# Patient Record
Sex: Female | Born: 1980 | Race: White | Hispanic: No | Marital: Married | Smoking: Former smoker
Health system: Southern US, Community
[De-identification: ages and names within clinical notes are randomized; demographics above are authoritative.]

## PROBLEM LIST (undated history)

## (undated) ENCOUNTER — Inpatient Hospital Stay (HOSPITAL_COMMUNITY): Payer: Self-pay

## (undated) DIAGNOSIS — R079 Chest pain, unspecified: Secondary | ICD-10-CM

## (undated) DIAGNOSIS — G9332 Myalgic encephalomyelitis/chronic fatigue syndrome: Secondary | ICD-10-CM

## (undated) DIAGNOSIS — R7303 Prediabetes: Secondary | ICD-10-CM

## (undated) DIAGNOSIS — F419 Anxiety disorder, unspecified: Secondary | ICD-10-CM

## (undated) DIAGNOSIS — R7989 Other specified abnormal findings of blood chemistry: Secondary | ICD-10-CM

## (undated) DIAGNOSIS — G709 Myoneural disorder, unspecified: Secondary | ICD-10-CM

## (undated) DIAGNOSIS — R739 Hyperglycemia, unspecified: Secondary | ICD-10-CM

## (undated) DIAGNOSIS — E559 Vitamin D deficiency, unspecified: Secondary | ICD-10-CM

## (undated) DIAGNOSIS — F40240 Claustrophobia: Secondary | ICD-10-CM

## (undated) DIAGNOSIS — F329 Major depressive disorder, single episode, unspecified: Secondary | ICD-10-CM

## (undated) DIAGNOSIS — G43909 Migraine, unspecified, not intractable, without status migrainosus: Secondary | ICD-10-CM

## (undated) DIAGNOSIS — D689 Coagulation defect, unspecified: Secondary | ICD-10-CM

## (undated) DIAGNOSIS — F1910 Other psychoactive substance abuse, uncomplicated: Secondary | ICD-10-CM

## (undated) DIAGNOSIS — R0602 Shortness of breath: Secondary | ICD-10-CM

## (undated) DIAGNOSIS — E669 Obesity, unspecified: Secondary | ICD-10-CM

## (undated) DIAGNOSIS — E282 Polycystic ovarian syndrome: Secondary | ICD-10-CM

## (undated) DIAGNOSIS — G35D Multiple sclerosis, unspecified: Secondary | ICD-10-CM

## (undated) DIAGNOSIS — F603 Borderline personality disorder: Secondary | ICD-10-CM

## (undated) DIAGNOSIS — K219 Gastro-esophageal reflux disease without esophagitis: Secondary | ICD-10-CM

## (undated) DIAGNOSIS — D509 Iron deficiency anemia, unspecified: Secondary | ICD-10-CM

## (undated) DIAGNOSIS — F32A Depression, unspecified: Secondary | ICD-10-CM

## (undated) DIAGNOSIS — M1990 Unspecified osteoarthritis, unspecified site: Secondary | ICD-10-CM

## (undated) DIAGNOSIS — F1020 Alcohol dependence, uncomplicated: Secondary | ICD-10-CM

## (undated) DIAGNOSIS — R5383 Other fatigue: Secondary | ICD-10-CM

## (undated) DIAGNOSIS — R011 Cardiac murmur, unspecified: Secondary | ICD-10-CM

## (undated) DIAGNOSIS — M2550 Pain in unspecified joint: Secondary | ICD-10-CM

## (undated) DIAGNOSIS — L409 Psoriasis, unspecified: Secondary | ICD-10-CM

## (undated) DIAGNOSIS — E785 Hyperlipidemia, unspecified: Secondary | ICD-10-CM

## (undated) DIAGNOSIS — Z8751 Personal history of pre-term labor: Secondary | ICD-10-CM

## (undated) DIAGNOSIS — K5900 Constipation, unspecified: Secondary | ICD-10-CM

## (undated) DIAGNOSIS — F3181 Bipolar II disorder: Secondary | ICD-10-CM

## (undated) DIAGNOSIS — O139 Gestational [pregnancy-induced] hypertension without significant proteinuria, unspecified trimester: Secondary | ICD-10-CM

## (undated) DIAGNOSIS — O99210 Obesity complicating pregnancy, unspecified trimester: Secondary | ICD-10-CM

## (undated) DIAGNOSIS — G4730 Sleep apnea, unspecified: Secondary | ICD-10-CM

## (undated) DIAGNOSIS — IMO0002 Reserved for concepts with insufficient information to code with codable children: Secondary | ICD-10-CM

## (undated) DIAGNOSIS — L4050 Arthropathic psoriasis, unspecified: Secondary | ICD-10-CM

## (undated) DIAGNOSIS — G35 Multiple sclerosis: Secondary | ICD-10-CM

## (undated) DIAGNOSIS — N970 Female infertility associated with anovulation: Secondary | ICD-10-CM

## (undated) HISTORY — DX: Obesity, unspecified: E66.9

## (undated) HISTORY — DX: Anxiety disorder, unspecified: F41.9

## (undated) HISTORY — DX: Psoriasis, unspecified: L40.9

## (undated) HISTORY — DX: Iron deficiency anemia, unspecified: D50.9

## (undated) HISTORY — DX: Major depressive disorder, single episode, unspecified: F32.9

## (undated) HISTORY — DX: Multiple sclerosis: G35

## (undated) HISTORY — DX: Arthropathic psoriasis, unspecified: L40.50

## (undated) HISTORY — DX: Obesity complicating pregnancy, unspecified trimester: O99.210

## (undated) HISTORY — DX: Polycystic ovarian syndrome: E28.2

## (undated) HISTORY — DX: Other fatigue: R53.83

## (undated) HISTORY — DX: Constipation, unspecified: K59.00

## (undated) HISTORY — DX: Other specified abnormal findings of blood chemistry: R79.89

## (undated) HISTORY — DX: Bipolar II disorder: F31.81

## (undated) HISTORY — DX: Prediabetes: R73.03

## (undated) HISTORY — DX: Sleep apnea, unspecified: G47.30

## (undated) HISTORY — DX: Gastro-esophageal reflux disease without esophagitis: K21.9

## (undated) HISTORY — DX: Chest pain, unspecified: R07.9

## (undated) HISTORY — DX: Vitamin D deficiency, unspecified: E55.9

## (undated) HISTORY — DX: Pain in unspecified joint: M25.50

## (undated) HISTORY — DX: Reserved for concepts with insufficient information to code with codable children: IMO0002

## (undated) HISTORY — DX: Myoneural disorder, unspecified: G70.9

## (undated) HISTORY — DX: Myalgic encephalomyelitis/chronic fatigue syndrome: G93.32

## (undated) HISTORY — DX: Shortness of breath: R06.02

## (undated) HISTORY — DX: Depression, unspecified: F32.A

## (undated) HISTORY — DX: Unspecified osteoarthritis, unspecified site: M19.90

## (undated) HISTORY — DX: Other psychoactive substance abuse, uncomplicated: F19.10

## (undated) HISTORY — DX: Coagulation defect, unspecified: D68.9

## (undated) HISTORY — DX: Personal history of pre-term labor: Z87.51

## (undated) HISTORY — DX: Hyperlipidemia, unspecified: E78.5

## (undated) HISTORY — DX: Female infertility associated with anovulation: N97.0

## (undated) HISTORY — DX: Borderline personality disorder: F60.3

## (undated) HISTORY — DX: Alcohol dependence, uncomplicated: F10.20

## (undated) HISTORY — DX: Multiple sclerosis, unspecified: G35.D

## (undated) HISTORY — DX: Hyperglycemia, unspecified: R73.9

---

## 2007-11-19 ENCOUNTER — Ambulatory Visit: Payer: Self-pay | Admitting: Family Medicine

## 2007-11-19 DIAGNOSIS — E785 Hyperlipidemia, unspecified: Secondary | ICD-10-CM

## 2007-11-26 ENCOUNTER — Ambulatory Visit: Payer: Self-pay | Admitting: Family Medicine

## 2007-11-26 DIAGNOSIS — E669 Obesity, unspecified: Secondary | ICD-10-CM | POA: Insufficient documentation

## 2007-11-27 ENCOUNTER — Encounter: Payer: Self-pay | Admitting: Family Medicine

## 2007-11-28 LAB — CONVERTED CEMR LAB
Basophils Relative: 0 % (ref 0–1)
Lymphs Abs: 3.1 10*3/uL (ref 0.7–4.0)
MCHC: 34.3 g/dL (ref 30.0–36.0)
Monocytes Relative: 8 % (ref 3–12)
Neutro Abs: 5 10*3/uL (ref 1.7–7.7)
Neutrophils Relative %: 55 % (ref 43–77)
RBC: 4.51 M/uL (ref 3.87–5.11)
WBC: 9 10*3/uL (ref 4.0–10.5)

## 2007-12-31 ENCOUNTER — Encounter: Payer: Self-pay | Admitting: Family Medicine

## 2008-01-05 ENCOUNTER — Ambulatory Visit: Payer: Self-pay | Admitting: Family Medicine

## 2008-01-05 DIAGNOSIS — F4322 Adjustment disorder with anxiety: Secondary | ICD-10-CM

## 2008-02-20 ENCOUNTER — Encounter: Payer: Self-pay | Admitting: Family Medicine

## 2008-02-20 LAB — CONVERTED CEMR LAB
ALT: 60 units/L
AST: 51 units/L
Albumin: 4.4 g/dL
BUN: 9 mg/dL
Calcium: 9.3 mg/dL
Chloride: 103 meq/L
Hemoglobin: 13.5 g/dL
Potassium: 4.4 meq/L
Sodium: 139 meq/L
Total Protein: 7.7 g/dL

## 2008-02-26 ENCOUNTER — Ambulatory Visit: Payer: Self-pay | Admitting: Family Medicine

## 2008-03-05 ENCOUNTER — Encounter: Payer: Self-pay | Admitting: Family Medicine

## 2008-03-05 DIAGNOSIS — G35 Multiple sclerosis: Secondary | ICD-10-CM | POA: Insufficient documentation

## 2008-03-05 DIAGNOSIS — E559 Vitamin D deficiency, unspecified: Secondary | ICD-10-CM | POA: Insufficient documentation

## 2008-03-11 ENCOUNTER — Telehealth: Payer: Self-pay | Admitting: Family Medicine

## 2008-03-29 ENCOUNTER — Encounter: Payer: Self-pay | Admitting: Family Medicine

## 2008-04-05 ENCOUNTER — Encounter: Payer: Self-pay | Admitting: Family Medicine

## 2008-05-17 ENCOUNTER — Ambulatory Visit: Payer: Self-pay | Admitting: Family Medicine

## 2008-08-15 HISTORY — PX: DILATION AND CURETTAGE OF UTERUS: SHX78

## 2008-08-25 ENCOUNTER — Encounter: Payer: Self-pay | Admitting: Obstetrics and Gynecology

## 2008-08-25 ENCOUNTER — Ambulatory Visit (HOSPITAL_COMMUNITY): Admission: RE | Admit: 2008-08-25 | Discharge: 2008-08-25 | Payer: Self-pay | Admitting: Obstetrics and Gynecology

## 2008-09-03 ENCOUNTER — Ambulatory Visit (HOSPITAL_COMMUNITY): Admission: RE | Admit: 2008-09-03 | Discharge: 2008-09-03 | Payer: Self-pay | Admitting: Obstetrics and Gynecology

## 2008-09-14 ENCOUNTER — Encounter: Payer: Self-pay | Admitting: Family Medicine

## 2008-10-27 ENCOUNTER — Ambulatory Visit: Payer: Self-pay | Admitting: Family Medicine

## 2009-01-03 ENCOUNTER — Ambulatory Visit: Payer: Self-pay | Admitting: Family Medicine

## 2009-01-19 ENCOUNTER — Encounter: Payer: Self-pay | Admitting: Family Medicine

## 2009-03-08 ENCOUNTER — Ambulatory Visit: Payer: Self-pay | Admitting: Family Medicine

## 2009-03-15 ENCOUNTER — Encounter: Payer: Self-pay | Admitting: Family Medicine

## 2009-03-22 ENCOUNTER — Ambulatory Visit: Payer: Self-pay | Admitting: Family Medicine

## 2009-03-22 DIAGNOSIS — G43909 Migraine, unspecified, not intractable, without status migrainosus: Secondary | ICD-10-CM | POA: Insufficient documentation

## 2009-03-22 DIAGNOSIS — M545 Low back pain: Secondary | ICD-10-CM

## 2009-03-23 ENCOUNTER — Encounter: Admission: RE | Admit: 2009-03-23 | Discharge: 2009-03-23 | Payer: Self-pay | Admitting: Family Medicine

## 2009-03-24 ENCOUNTER — Ambulatory Visit: Payer: Self-pay | Admitting: Family Medicine

## 2009-04-05 ENCOUNTER — Encounter: Payer: Self-pay | Admitting: Family Medicine

## 2009-06-20 ENCOUNTER — Ambulatory Visit: Payer: Self-pay | Admitting: Family Medicine

## 2009-06-20 DIAGNOSIS — K210 Gastro-esophageal reflux disease with esophagitis: Secondary | ICD-10-CM

## 2009-07-27 ENCOUNTER — Ambulatory Visit: Payer: Self-pay | Admitting: Family Medicine

## 2009-07-29 ENCOUNTER — Telehealth: Payer: Self-pay | Admitting: Family Medicine

## 2009-08-01 ENCOUNTER — Encounter: Payer: Self-pay | Admitting: Family Medicine

## 2009-08-05 ENCOUNTER — Encounter: Payer: Self-pay | Admitting: Family Medicine

## 2009-08-30 ENCOUNTER — Ambulatory Visit: Payer: Self-pay | Admitting: Obstetrics & Gynecology

## 2009-09-05 ENCOUNTER — Ambulatory Visit: Payer: Self-pay | Admitting: Family Medicine

## 2009-09-05 ENCOUNTER — Encounter: Payer: Self-pay | Admitting: Obstetrics & Gynecology

## 2009-09-06 LAB — CONVERTED CEMR LAB
ALT: 24 units/L (ref 0–35)
Albumin: 4.5 g/dL (ref 3.5–5.2)
CO2: 20 meq/L (ref 19–32)
Calcium: 9.3 mg/dL (ref 8.4–10.5)
Chloride: 108 meq/L (ref 96–112)
Cholesterol: 241 mg/dL — ABNORMAL HIGH (ref 0–200)
Glucose, Bld: 101 mg/dL — ABNORMAL HIGH (ref 70–99)
Potassium: 4.1 meq/L (ref 3.5–5.3)
Sodium: 139 meq/L (ref 135–145)
Total Protein: 7.1 g/dL (ref 6.0–8.3)
Triglycerides: 216 mg/dL — ABNORMAL HIGH (ref <150)

## 2009-10-06 ENCOUNTER — Ambulatory Visit: Payer: Self-pay | Admitting: Family Medicine

## 2009-10-06 DIAGNOSIS — J069 Acute upper respiratory infection, unspecified: Secondary | ICD-10-CM | POA: Insufficient documentation

## 2009-11-01 ENCOUNTER — Ambulatory Visit: Payer: Self-pay | Admitting: Family Medicine

## 2009-11-01 ENCOUNTER — Ambulatory Visit: Payer: Self-pay | Admitting: Obstetrics & Gynecology

## 2009-11-10 ENCOUNTER — Ambulatory Visit (HOSPITAL_COMMUNITY): Admission: RE | Admit: 2009-11-10 | Discharge: 2009-11-10 | Payer: Self-pay | Admitting: Anesthesiology

## 2009-12-05 ENCOUNTER — Ambulatory Visit: Payer: Self-pay | Admitting: Family Medicine

## 2009-12-05 DIAGNOSIS — R635 Abnormal weight gain: Secondary | ICD-10-CM

## 2009-12-12 ENCOUNTER — Encounter: Payer: Self-pay | Admitting: Obstetrics & Gynecology

## 2009-12-12 ENCOUNTER — Ambulatory Visit: Payer: Self-pay | Admitting: Obstetrics and Gynecology

## 2009-12-12 LAB — CONVERTED CEMR LAB: hCG, Beta Chain, Quant, S: <2 milliintl units/mL

## 2009-12-14 ENCOUNTER — Encounter: Payer: Self-pay | Admitting: Family Medicine

## 2010-02-14 NOTE — Assessment & Plan Note (Signed)
Summary: Wt Check  Nurse Visit   Vital Signs:  Patient profile:   30 year old female Height:      67 inches Weight:      217 pounds BMI:     34.11 O2 Sat:      99 % on Room air Pulse rate:   102 / minute BP sitting:   114 / 68  (left arm) Cuff size:   large  Vitals Entered By: Payton Spark CMA (July 27, 2009 2:39 PM)  O2 Flow:  Room air CC: Weight check and BP check. Would like refill.   Allergies: No Known Drug Allergies  Orders Added: 1)  Est. Patient Level I [95621]    Impression & Recommendations:  Problem # 1:  OBESITY, UNSPECIFIED (ICD-278.00) 8 more lbs lost with healthy diet, regular exercise and phentermine.  BP looks great.  Tolerating meds well.  Continue at current dose.  Will recheck fsating sugar and cholesterol next month with nurse visit.  Complete Medication List: 1)  Tysabri 300 Mg/44ml Conc (Natalizumab) .... Infused once monthly 2)  Imitrex 25 Mg Tabs (Sumatriptan succinate) .... Prn 3)  Depo-provera 150 Mg/ml Susp (Medroxyprogesterone acetate) .... Inj every 3 months 4)  Keppra 500 Mg Tabs (Levetiracetam) 5)  Alprazolam 0.5 Mg Tabs (Alprazolam) .Marland Kitchen.. 1 tab by mouth at bedtime as needed anxiety 6)  Phentermine Hcl 37.5 Mg Caps (Phentermine hcl) .Marland Kitchen.. 1 capsule by mouth qam, take 30 min before breakfast

## 2010-02-14 NOTE — Assessment & Plan Note (Signed)
Summary: URI/ WT   Vital Signs:  Patient profile:   30 year old female Height:      67 inches Weight:      211 pounds BMI:     33.17 O2 Sat:      97 % on Room air Temp:     98.6 degrees F oral Pulse rate:   93 / minute BP sitting:   126 / 85  (left arm) Cuff size:   large  Vitals Entered By: Payton Spark CMA (October 06, 2009 11:31 AM)  O2 Flow:  Room air CC: head congestion, facial pressure, scratchy throat, achy and dry cough x 2 days.   Primary Care Provider:  Seymour Bars DO  CC:  head congestion, facial pressure, scratchy throat, and achy and dry cough x 2 days.Marland Kitchen  History of Present Illness: 30 yo WF presents for 2 days of runny nose, congestion, sore throat.  Her cough is getting worse with sinus pressure and pain.  Her last dose of Doy Mince was in August.  Her weight was down to 205 until she went on her bachelorette party over the weekend in [facility].  Admits to indulging in alcohol and lots of food.  She denies any adverse SEs and thinks that the Phentermine was really helping her appetite.     Current Medications (verified): 1)  Tysabri 300 Mg/20ml Conc (Natalizumab) .... Infused Once Monthly 2)  Imitrex 25 Mg Tabs (Sumatriptan Succinate) .... Prn 3)  Depo-Provera 150 Mg/ml Susp (Medroxyprogesterone Acetate) .... Inj Every 3 Months 4)  Keppra 500 Mg Tabs (Levetiracetam) 5)  Alprazolam 0.5 Mg Tabs (Alprazolam) .Marland Kitchen.. 1 Tab By Mouth At Bedtime As Needed Anxiety 6)  Phentermine Hcl 37.5 Mg Caps (Phentermine Hcl) .Marland Kitchen.. 1 Capsule By Mouth Qam, Take 30 Min Before Breakfast  Allergies (verified): No Known Drug Allergies  Past History:  Past Medical History: Reviewed history from 10/27/2008 and no changes required. MS, relapsing remitting 2006 diagnosed--sees Neuro Hyperlipidemia- not on meds G1P0 Migraines obese  Social History: Reviewed history from 05/17/2008 and no changes required. Quit smoking 1 yr ago smoked 13 yrs, restarted 02-2008 Occupation:Recruiter  CRA Alcohol use-yes Drug use-yes Regular exercise-no Dating BF x 6 yrs.    Review of Systems      See HPI  Physical Exam  General:  alert, well-developed, well-nourished, and well-hydrated.   Head:  normocephalic and atraumatic.  sinuses NTTP Eyes:  conjunctiva clear Ears:  EACs patent; TMs translucent and gray with good cone of light and bony landmarks.  Nose:  clear rhinorrhea Mouth:  o/p injected with 1+ tonsilar hypertrophy Neck:  no masses.   Lungs:  Normal respiratory effort, chest expands symmetrically. Lungs are clear to auscultation, no crackles or wheezes. Heart:  Normal rate and regular rhythm. S1 and S2 normal without gallop, murmur, click, rub or other extra sounds. Skin:  color normal.   Cervical Nodes:  No lymphadenopathy noted Psych:  good eye contact, not anxious appearing, and not depressed appearing.     Impression & Recommendations:  Problem # 1:  VIRAL URI (ICD-465.9) Day 3 of viral URI but pt is on immunosuppressants with her MS. I will empirically cover her with Azithromycin since she is immunosuppressed. Take Dayquil/ Nyquil/ Vit C for supportive care with rest, plenty of clear fluids. call if any worsening or failing to improve after 10 days.  Problem # 2:  OBESITY, UNSPECIFIED (ICD-278.00) BMI 33 with only 1 lb lost in the past month on phentermine with normal BP and  HR today. She reports losing an additional 6 lbs of weight prior to a trip to [facility] for her bachelorette party. She agrees to work on diet, exercise with another month of Phentermine.  Complete Medication List: 1)  Tysabri 300 Mg/88ml Conc (Natalizumab) .... Infused once monthly 2)  Imitrex 25 Mg Tabs (Sumatriptan succinate) .... Prn 3)  Depo-provera 150 Mg/ml Susp (Medroxyprogesterone acetate) .... Inj every 3 months 4)  Keppra 500 Mg Tabs (Levetiracetam) 5)  Alprazolam 0.5 Mg Tabs (Alprazolam) .Marland Kitchen.. 1 tab by mouth at bedtime as needed anxiety 6)  Phentermine Hcl 37.5 Mg Tabs  (Phentermine hcl) .Marland Kitchen.. 1 tab by mouth qam, take 30 min before breakfast 7)  Azithromycin 250 Mg Tabs (Azithromycin) .... 2 tabs by mouth x 1 day then 1 tab by mouth daily x 4 days  Patient Instructions: 1)  Take Dayquil/ Nyquil/ Vitamin C. 2)  Rest, drink plenty of fluids. 3)  Start on Azithromycin -- take x 5 days (since you are on immunosuppressant drugs). 4)  Call if getting worse or not improving after 10 days. 5)  Return for a nurse BP/ WT check in 1 month. Prescriptions: AZITHROMYCIN 250 MG TABS (AZITHROMYCIN) 2 tabs by mouth x 1 day then 1 tab by mouth daily x 4 days  #6 tabs x 0   Entered and Authorized by:   Seymour Bars DO   Signed by:   Seymour Bars DO on 10/06/2009   Method used:   Electronically to        [facility] [phone]* (retail)       [address]       [place], [place]  [address]       Ph: [phone]       Fax: [phone]   RxID:   [id number] PHENTERMINE HCL 37.5 MG TABS (PHENTERMINE HCL) 1 tab by mouth qAM, take 30 min before breakfast  #30 x 0   Entered and Authorized by:   Seymour Bars DO   Signed by:   Seymour Bars DO on 10/06/2009   Method used:   Printed then faxed to ...       [facility] [phone]* (retail)       [address] [place], [place]  [address]       Ph: [phone]       Fax: [phone]   RxID:   [id number] PHENTERMINE HCL 37.5 MG CAPS (PHENTERMINE HCL) 1 capsule by mouth qAM, take 30 min before breakfast  #30 x 0   Entered and Authorized by:   Seymour Bars DO   Signed by:   Seymour Bars DO on 10/06/2009   Method used:   Printed then faxed to ...       [facility] [phone]* (retail)       [address] [place], [place]  [address]       Ph: [phone]       Fax: [phone]   RxID:   [id number]

## 2010-02-14 NOTE — Letter (Signed)
Summary: [facility]  [facility]   Imported By: Lanelle Bal 03/22/2009 13:28:24  _____________________________________________________________________  External Attachment:    Type:   Image     Comment:   External Document

## 2010-02-14 NOTE — Assessment & Plan Note (Signed)
Summary: WT check   Vital Signs:  Patient profile:   30 year old female Height:      67 inches Weight:      212 pounds BMI:     33.32 O2 Sat:      99 % on Room air Pulse rate:   104 / minute BP sitting:   123 / 78  (left arm) Cuff size:   large  Vitals Entered By: Payton Spark CMA (September 05, 2009 9:58 AM)  O2 Flow:  Room air CC: Weight and BP check. Fasting labs.    CC:  Weight and BP check. Fasting labs. .  Current Medications (verified): 1)  Tysabri 300 Mg/79ml Conc (Natalizumab) .... Infused Once Monthly 2)  Imitrex 25 Mg Tabs (Sumatriptan Succinate) .... Prn 3)  Depo-Provera 150 Mg/ml Susp (Medroxyprogesterone Acetate) .... Inj Every 3 Months 4)  Keppra 500 Mg Tabs (Levetiracetam) 5)  Alprazolam 0.5 Mg Tabs (Alprazolam) .Marland Kitchen.. 1 Tab By Mouth At Bedtime As Needed Anxiety 6)  Phentermine Hcl 37.5 Mg Caps (Phentermine Hcl) .Marland Kitchen.. 1 Capsule By Mouth Qam, Take 30 Min Before Breakfast  Allergies (verified): No Known Drug Allergies   Impression & Recommendations:  Problem # 1:  OBESITY, UNSPECIFIED (ICD-278.00) Wt down 5 more lbs in the past month, on Phentermine, started 06-20-2009. BP/ HR look great and she has no adverse SEs. Recheck fasting labs today and will f/u lab results tomorrow. RFd Phentermine RX. OV in 4-6 wks.  Problem # 2:  CONTRACEPTIVE MANAGEMENT (ICD-V25.09) She declined her Depo Provera shot today.  Complete Medication List: 1)  Tysabri 300 Mg/35ml Conc (Natalizumab) .... Infused once monthly 2)  Imitrex 25 Mg Tabs (Sumatriptan succinate) .... Prn 3)  Depo-provera 150 Mg/ml Susp (Medroxyprogesterone acetate) .... Inj every 3 months 4)  Keppra 500 Mg Tabs (Levetiracetam) 5)  Alprazolam 0.5 Mg Tabs (Alprazolam) .Marland Kitchen.. 1 tab by mouth at bedtime as needed anxiety 6)  Phentermine Hcl 37.5 Mg Caps (Phentermine hcl) .Marland Kitchen.. 1 capsule by mouth qam, take 30 min before breakfast  Other Orders: T-Comprehensive Metabolic Panel [id number]) T-Lipid Profile  ([id number]) T-Vitamin D (25-Hydroxy) ([id number])  Patient Instructions: 1)  5 more lbs lost! 2)  Keep up the good work. 3)  Update fasting labs today. 4)  Will call you w/ results tomorrow. 5)  REturn for an office visit in 4-6 wks. Prescriptions: PHENTERMINE HCL 37.5 MG CAPS (PHENTERMINE HCL) 1 capsule by mouth qAM, take 30 min before breakfast  #30 x 0   Entered and Authorized by:   Seymour Bars DO   Signed by:   Seymour Bars DO on 09/05/2009   Method used:   Printed then faxed to ...       [facility] [phone]* (retail)       [address] [place], [place]  [address]       Ph: [phone]       Fax: [phone]   RxID:   [id number]

## 2010-02-14 NOTE — Letter (Signed)
Summary: [facility]  [facility]   Imported By: Maryln Gottron 12/23/2009 10:38:29  _____________________________________________________________________  External Attachment:    Type:   Image     Comment:   External Document

## 2010-02-14 NOTE — Assessment & Plan Note (Signed)
Summary: PAPER WORK   Allergies: No Known Drug Allergies   Complete Medication List: 1)  Tysabri 300 Mg/11ml Conc (Natalizumab) .... Infused once monthly 2)  Imitrex 25 Mg Tabs (Sumatriptan succinate) .... Prn 3)  Depo-provera 150 Mg/ml Susp (Medroxyprogesterone acetate) .... Inj every 3 months 4)  Keppra 500 Mg Tabs (Levetiracetam) 5)  Alprazolam 0.5 Mg Tabs (Alprazolam) .Marland Kitchen.. 1 tab by mouth at bedtime as needed anxiety

## 2010-02-14 NOTE — Assessment & Plan Note (Signed)
Summary: WT check  Nurse Visit   Vital Signs:  Patient profile:   30 year old female Height:      67 inches Weight:      215 pounds BMI:     33.80 Pulse rate:   113 / minute BP sitting:   141 / 90  (right arm) Cuff size:   large  Vitals Entered By: Avon Gully CMA, Duncan Dull) (December 05, 2009 2:07 PM)  History of Present Illness: BP and Wt check   Allergies: No Known Drug Allergies  Orders Added: 1)  Est. Patient Level I [[address]]    Impression & Recommendations:  Problem # 1:  OBESITY, UNSPECIFIED (ICD-278.00) WT gain and elevated BP.  Will NOT RF Phentermine.   Ht: 67 (11/01/2009)   Wt: 215 (12/05/2009)   BMI: 31.75 (11/01/2009)  Complete Medication List: 1)  Tysabri 300 Mg/72ml Conc (Natalizumab) .... Infused once monthly 2)  Imitrex 25 Mg Tabs (Sumatriptan succinate) .... Prn 3)  Depo-provera 150 Mg/ml Susp (Medroxyprogesterone acetate) .... Inj every 3 months 4)  Keppra 500 Mg Tabs (Levetiracetam) 5)  Alprazolam 0.5 Mg Tabs (Alprazolam) .Marland Kitchen.. 1 tab by mouth at bedtime as needed anxiety 6)  Phentermine Hcl 37.5 Mg Tabs (Phentermine hcl) .Marland Kitchen.. 1 tab by mouth qam, take 30 min before breakfast   Patient Instructions: 1)  BP elevated and WT gain. 2)  Cannot continue Phentermine RX. 3)  Work on [employer], regular exercise.

## 2010-02-14 NOTE — Letter (Signed)
Summary: [facility]  [facility]   Imported By: Lanelle Bal 02/03/2009 11:46:01  _____________________________________________________________________  External Attachment:    Type:   Image     Comment:   External Document

## 2010-02-14 NOTE — Assessment & Plan Note (Signed)
Summary: wt & BP check - jr  Nurse Visit   Vital Signs:  Patient profile:   30 year old female Height:      67 inches Weight:      202 pounds BMI:     31.75 Pulse rate:   86 / minute BP sitting:   111 / 75  (left arm) Cuff size:   large  Vitals Entered By: Payton Spark CMA (November 01, 2009 1:21 PM)   Impression & Recommendations:  Problem # 1:  HYPERLIPIDEMIA (ICD-272.4) Secondary to obesity Labs Reviewed: SGOT: 21 (09/05/2009)   SGPT: 24 (09/05/2009)   HDL:34 (09/05/2009)  LDL:164 (09/05/2009)  Chol:241 (09/05/2009)  Trig:216 (09/05/2009)  Problem # 2:  OBESITY, UNSPECIFIED (ICD-278.00) Wt lost : 9 lbs in 1 month back on Phentermine with excellent BP. Keep up the good work.  Will RF if she wants.    Complete Medication List: 1)  Tysabri 300 Mg/75ml Conc (Natalizumab) .... Infused once monthly 2)  Imitrex 25 Mg Tabs (Sumatriptan succinate) .... Prn 3)  Depo-provera 150 Mg/ml Susp (Medroxyprogesterone acetate) .... Inj every 3 months 4)  Keppra 500 Mg Tabs (Levetiracetam) 5)  Alprazolam 0.5 Mg Tabs (Alprazolam) .Marland Kitchen.. 1 tab by mouth at bedtime as needed anxiety 6)  Phentermine Hcl 37.5 Mg Tabs (Phentermine hcl) .Marland Kitchen.. 1 tab by mouth qam, take 30 min before breakfast  CC: F/U weight and BP   Current Medications (verified): 1)  Tysabri 300 Mg/70ml Conc (Natalizumab) .... Infused Once Monthly 2)  Imitrex 25 Mg Tabs (Sumatriptan Succinate) .... Prn 3)  Depo-Provera 150 Mg/ml Susp (Medroxyprogesterone Acetate) .... Inj Every 3 Months 4)  Keppra 500 Mg Tabs (Levetiracetam) 5)  Alprazolam 0.5 Mg Tabs (Alprazolam) .Marland Kitchen.. 1 Tab By Mouth At Bedtime As Needed Anxiety 6)  Phentermine Hcl 37.5 Mg Tabs (Phentermine Hcl) .Marland Kitchen.. 1 Tab By Mouth Qam, Take 30 Min Before Breakfast  Allergies (verified): No Known Drug Allergies  Orders Added: 1)  Est. Patient Level I [88416] Prescriptions: PHENTERMINE HCL 37.5 MG TABS (PHENTERMINE HCL) 1 tab by mouth qAM, take 30 min before breakfast  #30  x 0   Entered and Authorized by:   Seymour Bars DO   Signed by:   Seymour Bars DO on 11/01/2009   Method used:   Print then Give to Patient   RxID:   [id number]

## 2010-02-14 NOTE — Assessment & Plan Note (Signed)
Summary: f/u PNA   Vital Signs:  Patient profile:   30 year old female Height:      67 inches Weight:      231 pounds BMI:     36.31 O2 Sat:      98 % on Room air Temp:     98.7 degrees F oral Pulse rate:   102 / minute BP sitting:   125 / 89  (left arm) Cuff size:   large  Vitals Entered By: Payton Spark CMA (March 22, 2009 2:04 PM)  O2 Flow:  Room air CC: F/U. Doing better since pneumonia but didnt complete ABX. Also c/o migraine   Primary Care Provider:  Seymour Bars DO  CC:  F/U. Doing better since pneumonia but didnt complete ABX. Also c/o migraine.  History of Present Illness: 30 yo WF presents for f/u pneumonia.  She took about 8 days out of 10 of the Augmentin for presumed pneumonia.  Her breathing and cough has improved a great deal.  Denies any fevers.  Able to sleep at night.    She woke up with a migraine today.  It went away until she had her MS infusion today and her head was throbbing.  She usually takes Imitrex nasal which does help.  Her migraine is on the R sided.  She has some nausea, no vomitting.  Has light sensitvity.  She tends to get migraines 1-2 x a wk.  She was just put on Keppra by her neurologist.  She has been out of work.  This has her stressed out.    She also has some low back pain that just started 2 wks ago.  No radiation of pain.  Pain is throbbing.  Hurts to bend forward.  Bayer back and body helps some.  No hx of back problems in the past.  Having trouble sleeping.    Current Medications (verified): 1)  Tysabri 300 Mg/90ml Conc (Natalizumab) .... Infused Once Monthly 2)  Imitrex 25 Mg Tabs (Sumatriptan Succinate) .... Prn 3)  Depo-Provera 150 Mg/ml Susp (Medroxyprogesterone Acetate) .... Inj Every 3 Months  Allergies (verified): No Known Drug Allergies  Review of Systems      See HPI  Physical Exam  General:  obese WF in mild distress  Head:  normocephalic and atraumatic.   Eyes:  pupils equal, pupils round, and pupils reactive to  light.   Nose:  no nasal discharge.   Mouth:  good dentition and pharynx pink and moist.   Neck:  no masses.   Lungs:  Normal respiratory effort, chest expands symmetrically. Lungs are clear to auscultation, no crackles or wheezes.  coiugh is much improved Heart:  Normal rate and regular rhythm. S1 and S2 normal without gallop, murmur, click, rub or other extra sounds. Msk:  tender at T12- L1 bilateral musculature without palpable spasm.  Limited active flexion to 70 deg, ext to 20 deg.  full SB and rotation. gait normal Extremities:  no LE edema Neurologic:  alert & oriented X3 and cranial nerves II-XII intact.   Skin:  color normal.   Cervical Nodes:  No lymphadenopathy noted Psych:  depressed affect.     Impression & Recommendations:  Problem # 1:  MIGRAINE HEADACHE (ICD-346.90) Followed by neurology for both migraines and MS.  Complicated by recent stressors, lack of exercise, and depression. She declined Toradol injection today and will take Imitrex nasal at home.  Just started on Keppra so will look for a decrease in her frequency  of chronic daily HA. Her updated medication list for this problem includes:    Imitrex 25 Mg Tabs (Sumatriptan succinate) .Marland Kitchen... Prn  Problem # 2:  PNEUMONIA, ORGANISM UNSPECIFIED (ICD-486) Assessment: Improved Lungs sound better and she took the augmentin for only 8 days.  Since clinically she has improved, will stop. The following medications were removed from the medication list:    Augmentin 875-125 Mg Tabs (Amoxicillin-pot clavulanate) .Marland Kitchen... 1 tab by mouth two times a day x 10 days  Problem # 3:  LUMBAGO (ICD-724.2) Assessment: New MSK LBP likely due to obesity/ lack of exercise, mechanical strain. Will get an XRay today. Orders: T-DG Lumbar Spine 2-3 Views (72100)  Problem # 4:  ADJUSTMENT DISORDER WITH ANXIETY (ICD-309.24) Anxiety/ Depression but has declined RX anti depressants.  Agrees to do some counseling downstairs and will try her on  low dose xanax at bedtime as needed. Orders: Psychology Referral (Psychology)  Complete Medication List: 1)  Tysabri 300 Mg/15ml Conc (Natalizumab) .... Infused once monthly 2)  Imitrex 25 Mg Tabs (Sumatriptan succinate) .... Prn 3)  Depo-provera 150 Mg/ml Susp (Medroxyprogesterone acetate) .... Inj every 3 months 4)  Keppra 500 Mg Tabs (Levetiracetam) 5)  Alprazolam 0.5 Mg Tabs (Alprazolam) .Marland Kitchen.. 1 tab by mouth at bedtime as needed anxiety  Other Orders: Depo-Provera 150mg  (J1055) Admin of Therapeutic Inj  intramuscular or subcutaneous ([address])  Patient Instructions: 1)  Use Alprazolam at night as needed for anxiety/ sleep. 2)  Will get you in with counselor downstairs for mood. 3)  Have L spine Xray done tomorrow and will call you w/ results. 4)  Take Migraine meds when you get home and rest. 5)  Return for f/u back pain and mood in 6 wks. Prescriptions: ALPRAZOLAM 0.5 MG TABS (ALPRAZOLAM) 1 tab by mouth at bedtime as needed anxiety  #24 x 0   Entered and Authorized by:   Seymour Bars DO   Signed by:   Seymour Bars DO on 03/22/2009   Method used:   Printed then faxed to ...       [facility] [phone]* (retail)       [address] [place], [place]  [address]       Ph: [phone]       Fax: [phone]   RxID:   [id number]    Medication Administration  Injection # 1:    Medication: Depo-Provera 150mg     Diagnosis: CONTRACEPTIVE MANAGEMENT (ICD-V25.09)    Route: IM    Site: LUOQ gluteus    Exp Date: 04/2011    Lot #: [id number]    Patient tolerated injection without complications    Given by: Payton Spark CMA (March 22, 2009 3:14 PM)  Orders Added: 1)  Psychology Referral [Psychology] 2)  T-DG Lumbar Spine 2-3 Views [72100] 3)  Depo-Provera 150mg  [J1055] 4)  Admin of Therapeutic Inj  intramuscular or subcutaneous [96372] 5)  Est. Patient Level IV [66440]

## 2010-02-14 NOTE — Letter (Signed)
Summary: [facility]  [facility]   Imported By: Lanelle Bal 08/15/2009 12:56:27  _____________________________________________________________________  External Attachment:    Type:   Image     Comment:   External Document

## 2010-02-14 NOTE — Letter (Signed)
Summary: [facility]  [facility]   Imported By: Lanelle Bal 04/13/2009 14:20:12  _____________________________________________________________________  External Attachment:    Type:   Image     Comment:   External Document

## 2010-02-14 NOTE — Letter (Signed)
Summary: Out of Work  [facility]  [address], [address]   [place], [place] [address]   Phone: [phone]  Fax: [phone]    March 08, 2009   Employee:  Tannisha FODOR    To Whom It May Concern:   For Medical reasons, please excuse the above named employee from work for the following dates:  Start:   Feb 22- 25th  End:   Feb 26th  If you need additional information, please feel free to contact our office.         Sincerely,    Seymour Bars DO

## 2010-02-14 NOTE — Assessment & Plan Note (Signed)
Summary: reflux/ wt   Vital Signs:  Patient profile:   30 year old female Height:      67 inches Weight:      225 pounds BMI:     35.37 O2 Sat:      99 % on Room air Pulse rate:   116 / minute BP sitting:   108 / 68  (left arm) Cuff size:   large  Vitals Entered By: Payton Spark CMA (June 20, 2009 1:04 PM)  O2 Flow:  Room air  CC: F/U weight.    Primary Care Provider:  Seymour Bars DO  CC:  F/U weight. .  History of Present Illness: 30 yo WF presents for f/u Depo shot, obesity and med RFs.    She lost 6 lbs in the past 2 months.  She restarted Phentermine this past month (had some at home) and it has helped curb her appetitie and help her energy level to start exercising.  She had a bout of acid reflux this weekend with chest pain when laying down.  She took some Zantac 75 mg which helped a little bit.  She just bout the 150 mg dose yesterday.  Denies insomnia, heart palpitations or tremor.  She is working on improving her diet.    Current Medications (verified): 1)  Tysabri 300 Mg/47ml Conc (Natalizumab) .... Infused Once Monthly 2)  Imitrex 25 Mg Tabs (Sumatriptan Succinate) .... Prn 3)  Depo-Provera 150 Mg/ml Susp (Medroxyprogesterone Acetate) .... Inj Every 3 Months 4)  Keppra 500 Mg Tabs (Levetiracetam) 5)  Alprazolam 0.5 Mg Tabs (Alprazolam) .Marland Kitchen.. 1 Tab By Mouth At Bedtime As Needed Anxiety  Allergies (verified): No Known Drug Allergies  Past History:  Past Medical History: Reviewed history from 10/27/2008 and no changes required. MS, relapsing remitting 2006 diagnosed--sees Neuro Hyperlipidemia- not on meds G1P0 Migraines obese  Social History: Reviewed history from 05/17/2008 and no changes required. 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    Review of Systems      See HPI  Physical Exam  General:  alert, well-developed, well-nourished,  and well-hydrated.  obese Head:  normocephalic and atraumatic.   Eyes:  sclera non icteric Mouth:  good dentition and pharynx pink and moist.   Neck:  no masses.   Lungs:  Normal respiratory effort, chest expands symmetrically. Lungs are clear to auscultation, no crackles or wheezes. Heart:  Normal rate and regular rhythm. S1 and S2 normal without gallop, murmur, click, rub or other extra sounds. Abdomen:  no epigastric TTP Extremities:  no LE edema Neurologic:  no resting tremor Skin:  color normal.   Cervical Nodes:  No lymphadenopathy noted Psych:  good eye contact, not anxious appearing, and not depressed appearing.     Impression & Recommendations:  Problem # 1:  REFLUX ESOPHAGITIS (ICD-530.11) Likely related to dietary intake, obesity.  Since symptoms are sporadic, recommend reflux precautions, wt loss and as needed use of Zantac.  Problem # 2:  OBESITY, UNSPECIFIED (ICD-278.00) BMI 35 c/w class II obesity. She is working on [employer], exercise and has done well w/ phentermine in the past. Her obesity is complicated by LBP, reflux esophagitis and dyslipidemia. Restart Phentermine in the AM.  A repeat resting HR today is 90. She is to avoid smoking and excess caffeine use and stick to a healthy diet with regular exercise. RTC for a nurse visit in 1 month.  Complete Medication List: 1)  Tysabri 300 Mg/16ml Conc (Natalizumab) .... Infused once monthly 2)  Imitrex 25 Mg Tabs (Sumatriptan succinate) .... Prn 3)  Depo-provera 150 Mg/ml Susp (Medroxyprogesterone acetate) .... Inj every 3 months 4)  Keppra 500 Mg Tabs (Levetiracetam) 5)  Alprazolam 0.5 Mg Tabs (Alprazolam) .Marland Kitchen.. 1 tab by mouth at bedtime as needed anxiety 6)  Phentermine Hcl 37.5 Mg Caps (Phentermine hcl) .Marland Kitchen.. 1 capsule by mouth qam, take 30 min before breakfast  Other Orders: Depo-Provera 150mg  (O5366) Admin of Therapeutic Inj  intramuscular or subcutaneous (44034)  Patient Instructions: 1)  Stay on  Phentermine each AM along with a healthy diet (high protein, low carb  ~1400 kcal/ day) + 1 hr of exercise 5 days/ wk. 2)  Drink plenty of water. 3)  Take Zantac 150 mg two times a day x 1 wk then go back to as needed. 4)  Return for a nurse visit wt/ BP check in 1 month. 5)  Plan to check fasting sugar/ cholesterol in Aug. Prescriptions: PHENTERMINE HCL 37.5 MG CAPS (PHENTERMINE HCL) 1 capsule by mouth qAM, take 30 min before breakfast  #30 x 0   Entered and Authorized by:   Seymour Bars DO   Signed by:   Seymour Bars DO on 06/20/2009   Method used:   Printed then faxed to ...       [facility] [phone]* (retail)       [address] [place], [place]  [address]       Ph: [phone]       Fax: [phone]   RxID:   [id number]    Medication Administration  Injection # 1:    Medication: Depo-Provera 150mg     Diagnosis: CONTRACEPTIVE MANAGEMENT (ICD-V25.09)    Route: IM    Site: LUOQ gluteus    Exp Date: 02/2012    Lot #: [id number]    Patient tolerated injection without complications    Given by: Payton Spark CMA (June 20, 2009 1:05 PM)  Orders Added: 1)  Depo-Provera 150mg  [J1055] 2)  Admin of Therapeutic Inj  intramuscular or subcutaneous [96372] 3)  Est. Patient Level III [62376]

## 2010-02-14 NOTE — Progress Notes (Signed)
Summary: med refill  Phone Note Refill Request Message from:  Patient on July 29, 2009 10:31 AM  Refills Requested: Medication #1:  PHENTERMINE HCL 37.5 MG CAPS 1 capsule by mouth qAM   Dosage confirmed as above?Dosage Confirmed Please call in her meds as capsules because  it is cheaper.Marland KitchenMarland KitchenShe states she was here last week and meds was not called in... If you have any questions call [phone]  Initial call taken by: Michaelle Copas,  July 29, 2009 10:37 AM Caller: Patient    Prescriptions: PHENTERMINE HCL 37.5 MG CAPS (PHENTERMINE HCL) 1 capsule by mouth qAM, take 30 min before breakfast  #30 x 0   Entered and Authorized by:   Seymour Bars DO   Signed by:   Seymour Bars DO on 07/29/2009   Method used:   Printed then faxed to ...       [facility] [phone]* (retail)       [address] [place], [place]  [address]       Ph: [phone]       Fax: [phone]   RxID:   [id number]   Appended Document: med refill Rx faxed

## 2010-02-14 NOTE — Assessment & Plan Note (Signed)
Summary: pneumonia   Vital Signs:  Patient profile:   30 year old female Height:      67 inches Weight:      229 pounds BMI:     36.00 O2 Sat:      98 % on Room air Temp:     98.9 degrees F oral Pulse rate:   111 / minute BP sitting:   164 / 101  (left arm) Cuff size:   large  Vitals Entered By: Payton Spark CMA (March 08, 2009 11:14 AM)  O2 Flow:  Room air  Serial Vital Signs/Assessments:  Comments: 11:19 AM [address] By: Payton Spark CMA   CC: Congestion, achy, cough and sneezing x 3 days   Primary Care Provider:  Seymour Bars DO  CC:  Congestion, achy, and cough and sneezing x 3 days.  History of Present Illness: 30 yo WF presents for 8 days of cough.  4 days ago, her cough got worse and she became congested and had fatiuge and malaise.  Yesterday, she felt a lot of head congestion, ear pressure, bad cough, sneezing, aches and pains.  Subjective fevers yesterday.  Denies GI upset.  She is taking [employer].  She is sleeping OK with cough syrup.  She has some chest tightness but no SOB.  She is on immunosuppressants for her MS.    Current Medications (verified): 1)  Tysabri 300 Mg/22ml Conc (Natalizumab) .... Infused Once Monthly 2)  Imitrex 25 Mg Tabs (Sumatriptan Succinate) .... Prn 3)  Depo-Provera 150 Mg/ml Susp (Medroxyprogesterone Acetate) .... Inj Every 3 Months  Allergies (verified): No Known Drug Allergies  Past History:  Past Medical History: Reviewed history from 10/27/2008 and no changes required. MS, relapsing remitting 2006 diagnosed--sees Neuro Hyperlipidemia- not on meds G1P0 Migraines obese  Social History: Reviewed history from 05/17/2008 and no changes required. 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    Review of Systems      See HPI  Physical Exam  General:  alert, well-developed,  well-nourished, and well-hydrated.  obese Head:  normocephalic and atraumatic.  sinuses NTTP Eyes:  conjunctiva clear Ears:  EACs patent; TMs translucent and gray with good cone of light and bony landmarks.  Nose:  mild rhinorrhea Mouth:  throat mildly injected.  o/p pink and moist Neck:  no masses.   Lungs:  nonlabored breathing.  no tachypnea.  rhonchi with deep cough.  No wheezing. Heart:  Normal rate and regular rhythm. S1 and S2 normal without gallop, murmur, click, rub or other extra sounds. Extremities:  no E/c/c Skin:  color normal and no rashes.   Cervical Nodes:  No lymphadenopathy noted Psych:  good eye contact, not anxious appearing, and not depressed appearing.     Impression & Recommendations:  Problem # 1:  PNEUMONIA, ORGANISM UNSPECIFIED (ICD-486) Clinically, she has community acquired pneumonia and is at risk for this given use of immunosuppressants for MS (next dose due in 2 wks.).  Will start her on Augmentin x 10 days.  RX cough meds given.  Lung exam relatively clear with no increased WOB and reassuring pulse ox.  BP and HR up today.  Out of work thru the wk.  Clear fluids,r est, avoid smoking and OTC Mucinex DM + Advil OK.  Call if any worsening.  f/u in 10 days. Her updated medication list for this problem includes:  Augmentin 875-125 Mg Tabs (Amoxicillin-pot clavulanate) .Marland Kitchen... 1 tab by mouth two times a day x 10 days  Complete Medication List: 1)  Tysabri 300 Mg/6ml Conc (Natalizumab) .... Infused once monthly 2)  Imitrex 25 Mg Tabs (Sumatriptan succinate) .... Prn 3)  Depo-provera 150 Mg/ml Susp (Medroxyprogesterone acetate) .... Inj every 3 months 4)  Hydromet 5-1.5 Mg/73ml Syrp (Hydrocodone-homatropine) .... 5 ml by mouth at bedtime as needed cough 5)  Augmentin 875-125 Mg Tabs (Amoxicillin-pot clavulanate) .Marland Kitchen.. 1 tab by mouth two times a day x 10 days  Patient Instructions: 1)  Take 10 days of Augmentin to cover for pneumonia. 2)  Use Mucinex DM in the AM  and Rx Hycodan at night for cough and congestion. 3)  Use Advil or Tylenol for aches and pains. 4)  REst, clear fluids and avoid smoking. 5)  Return for f/u in 10 days. Prescriptions: AUGMENTIN 875-125 MG TABS (AMOXICILLIN-POT CLAVULANATE) 1 tab by mouth two times a day x 10 days  #20 x 0   Entered and Authorized by:   Seymour Bars DO   Signed by:   Seymour Bars DO on 03/08/2009   Method used:   Electronically to        [facility] [phone]* (retail)       [address] [place], [place]  [address]       Ph: [phone]       Fax: [phone]   RxID:   [id number] HYDROMET 5-1.5 MG/5ML SYRP (HYDROCODONE-HOMATROPINE) 5 ml by mouth at bedtime as needed cough  #120 ml x 0   Entered and Authorized by:   Seymour Bars DO   Signed by:   Seymour Bars DO on 03/08/2009   Method used:   Printed then faxed to ...       [facility] [phone]* (retail)       [address] [place], [place]  [address]       Ph: [phone]       Fax: [phone]   RxID:   [id number]

## 2010-03-14 ENCOUNTER — Ambulatory Visit: Payer: BC Managed Care – PPO | Admitting: Obstetrics & Gynecology

## 2010-03-14 DIAGNOSIS — N979 Female infertility, unspecified: Secondary | ICD-10-CM

## 2010-03-14 DIAGNOSIS — E282 Polycystic ovarian syndrome: Secondary | ICD-10-CM

## 2010-03-22 ENCOUNTER — Encounter: Payer: Self-pay | Admitting: Obstetrics & Gynecology

## 2010-03-22 LAB — CONVERTED CEMR LAB
Estradiol: 121.2 pg/mL
Prolactin: 19.4 ng/mL
TSH: 2.434 microintl units/mL (ref 0.350–4.500)
Testosterone: 49.34 ng/dL (ref 10–70)

## 2010-03-23 ENCOUNTER — Encounter: Payer: Self-pay | Admitting: Obstetrics & Gynecology

## 2010-04-07 NOTE — Assessment & Plan Note (Signed)
Charlene [name], VARONE NO.:  [id number]  MEDICAL RECORD NO.:  [id number]           PATIENT TYPE:  LOCATION:  [facility]           FACILITY:  PHYSICIAN:  Elsie Lincoln, MD      DATE OF BIRTH:  Aug 14, 1980  DATE OF SERVICE:  03/14/2010                                 CLINIC NOTE  The patient is a 30 year old female who presents for followup infertility.  She has never had a positive ovulation test.  She is a HSG within normal limits.  She has a reproductive endocrinology appointment, Dr. Melvyn Novas in March.  Her husband has not had a semen analysis, but they did have a pregnancy back in 2010, so she did not think it was necessary, but I encouraged her to take this.  She has had some irregular bleeds and did not respond to 100 mg of Clomid for 2 months. She states she was told she was almost prediabetic when she weighed 30 pounds less, so she is very worried that she could be prediabetic or even diabetic, suggests to be tested for this, which is most likely polycystic ovarian syndrome.  We will draw some lab test.  She will be coming on Monday for 2-hour GTT with fasting insulin as well as some other hormones tests.  We will also get a basic metabolic panel just a creatinine, in case, she needs to go on metformin.  Two of these labs test as well as her x-ray and semen analysis in hand when she goes to see Dr. Letitia Libra with [facility].          ______________________________ Elsie Lincoln, MD    KL/MEDQ  D:  03/14/2010  T:  03/15/2010  Job:  [id number]

## 2010-05-30 NOTE — Assessment & Plan Note (Signed)
Charlene [name], Charlene [name] NO.:  [id number]   MEDICAL RECORD NO.:  [id number]          PATIENT TYPE:  POB   LOCATION:  [facility] [place]         FACILITY:  [facility]   PHYSICIAN:  Elsie Lincoln, MD      DATE OF BIRTH:  1980-04-19   DATE OF SERVICE:  08/30/2009                                  CLINIC NOTE   The patient is a 30 year old, G2, para 0-0-2-0 female who presents for  yearly exam and also preconceptional counseling.  The patient had  significant medical problems.  She has multiple sclerosis and undergoes  a black box drug infusion every 3 months.  The patient is due for an  infusion next week given that she is still uneventful with the Depo.  I  have encouraged her to get this infusion and then try to conceive the  next 3 months after that.  The patient also has psoriasis and has of her  labia and her scalp.  The patient has migraines and vitamin D deficiency  as well as high cholesterol.  The patient has conceived twice in the  past and had termination and then had a miscarriage.  She conceived  while she was on Ortho Evra patch.  The patient believes she has  irregular periods; however, she has been on birth control for some many  years.  It is unclear whether she truly has a regular periods or not.  She does have evidence of hirsutism, so there could be some PCO.   PAST MEDICAL HISTORY:  High cholesterol, multiple sclerosis, migraines,  and psoriasis.   PAST SURGICAL HISTORY:  D and C x2.   PAST OBSTETRICAL HISTORY:  TOP and SAB offered both first trimester.   MENSTRUAL HISTORY:  She has been on Depo or Ortho Evra, but periods are  irregular as much she can remember.   GYNECOLOGIC HISTORY:  The patient has had abnormal Pap smear several  years ago and needs colposcopy and biopsy.  She has never had any  freezing or burning of her cervix.  She had Chlamydia in 1996, no  history of endometriosis, fibroid tumors, or ovarian cysts.   SOCIAL HISTORY:  She  is not employed.  She lives with her fiance.  She  does smoke a half-pack per day for 15 years.  She drinks alcohol  socially.  She does not use drugs and has never been a victim of abuse.   FAMILY HISTORY:  Positive for heart disease in her maternal grandfather.  She does not have a history of breast, colon, ovarian or uterine cancer  or blood clots in legs or lungs.   MEDICATIONS:  Donnamarie Poag which is the black box drug, category X for  multiple sclerosis, phentermine, Imitrex, and vitamin D.   ALLERGIES:  None.   SYSTEMIC REVIEW:  Positive for weight loss, dizziness, lightheadedness,  cough, shortness of breath, muscle or joint pain, frequent urination,  vaginal itching, and pain with intercourse.   PHYSICAL EXAMINATION:  VITAL SIGNS:  Pulse 90, blood pressure 119/90,  weight 212, height 66 inches.  GENERAL:  Well-nourished, well-developed, in no apparent distress.  HEENT:  Normocephalic.  Psoriasis in her scalp.  Thyroid, no masses.  LUNGS:  Clear to auscultation bilaterally.  HEART:  Regular rate and rhythm.  BREASTS:  No masses, nontender.  No nipple discharge.  ABDOMEN:  Soft, nontender.  No organomegaly.  No hernia.  Positive  hirsutism on the lower abdomen.  GENITALIA:  Tanner V with psoriasis and labia majora and some ingrown  hairs.  Cervix, vagina pink, normal rugae.  Cervix closed, nontender.  Uterus anteverted, nontender.  Adnexa; no masses, nontender.  EXTREMITIES:  No edema.   ASSESSMENT AND PLAN:  A 30 year old female well-woman exam.  1. Pap smear.  2. The patient does not need STD testing per patient request.  3. Cystic fibrosis testing.  4. We will check with pharmacy to see if she needs increased folic      acid with the multiple sclerosis medication.  Her neurologist said      this to be a good time for her to get pregnant.  5. The patient told not to take Imitrex in pregnant or the      phentermine.  6. The patient come back in 2 months to see if she is  ovulating and      menstruating out from the negative effects of the Depo.  7. Weight loss smoking cessation encouraged.           ______________________________  Elsie Lincoln, MD     KL/MEDQ  D:  08/30/2009  T:  08/31/2009  Job:  [id number]

## 2010-05-30 NOTE — Assessment & Plan Note (Signed)
NAMEQUIERA, DIFFEE NO.:  [id number]   MEDICAL RECORD NO.:  [id number]          PATIENT TYPE:  POB   LOCATION:  [facility] [place]         FACILITY:  [facility]   PHYSICIAN:  Elsie Lincoln, MD      DATE OF BIRTH:  10-20-1980   DATE OF SERVICE:  11/01/2009                                  CLINIC NOTE   The patient is a 30 year old female who presents for amenorrhea.  The  patient has been on Depo-Provera for 5 months.  She has not had a period  yet.  Before her Depo-Provera, she has never ovulated.  She only had  polycystic ovarian syndrome.  We discussed options.  I think the best  option at this time is the urine pregnancy test which we did today and  is negative.  She is off the Tysabri.  She is still on phentermine, but  she will stop this if she becomes pregnant and she knows not to take  Imitrex if she is pregnant.  She is also on 1 mg of folic acid to  counteract the Tysabri that she had been on.  Given that, she has now  been off Depo for 5 months, but actually she has only been 2 months  after the Depo is worn off.  I want to see if she will have a Provera  withdrawal bleed, but if she does not a Provera withdrawal bleed, then I  would think that she still underneath some of the effects of the Depo.  She does have a Provera withdrawal bleed and she is not ovulating.  She  also has a history of Chlamydia, and I think she needs HSG.  We will do  this after she has her first bleed.  We will also do Clomid.  Her  husband did father, they did become pregnant last year, so she does not  want to do a semen analysis at this time, but that would probably be  indicated at sometime as she does not conceive.  We went over a lot of  instructions about the Provera withdrawal bleed and the HSG and Clomid  day 3-7.  We are also going to do doxycycline 100 mg 1 tablet p.o.  b.i.d. the day before the HSG, the day of the HSG, and 1 day after the  HSG to reduce the chance of  PID.  The patient will call us after taking  the Provera if she does not bleed.  If she does bleed, she will take the  Clomid and then if she does not become pregnant, she will call us.  We  will await to hear from the patient to see where she stands.           ______________________________  Elsie Lincoln, MD     KL/MEDQ  D:  11/01/2009  T:  11/02/2009  Job:  [id number]

## 2011-01-01 ENCOUNTER — Encounter: Payer: Self-pay | Admitting: Family

## 2011-01-01 ENCOUNTER — Ambulatory Visit (INDEPENDENT_AMBULATORY_CARE_PROVIDER_SITE_OTHER): Payer: PRIVATE HEALTH INSURANCE | Admitting: Family

## 2011-01-01 VITALS — BP 117/77 | HR 86 | Temp 98.5°F | Resp 16 | Ht 67.0 in | Wt 251.0 lb

## 2011-01-01 DIAGNOSIS — Z113 Encounter for screening for infections with a predominantly sexual mode of transmission: Secondary | ICD-10-CM

## 2011-01-01 DIAGNOSIS — Z01419 Encounter for gynecological examination (general) (routine) without abnormal findings: Secondary | ICD-10-CM

## 2011-01-01 DIAGNOSIS — Z1272 Encounter for screening for malignant neoplasm of vagina: Secondary | ICD-10-CM

## 2011-01-01 NOTE — Progress Notes (Signed)
  Subjective:     Charlene [name] is a 30 y.o. female and is here for a comprehensive physical exam. The patient reports no problems. Scheduled for invitro fertilization soon and this exam was a prerequisite.   History   Social History  . Marital Status: Single    Spouse Name: N/A    Number of Children: N/A  . Years of Education: N/A   Occupational History  . office manager    Social History Main Topics  . Smoking status: Former Smoker -- 1.0 packs/day for 15 years    Types: Cigarettes    Quit date: 07/02/2010  . Smokeless tobacco: Never Used  . Alcohol Use: Yes     occassion  . Drug Use: No  . Sexually Active: Yes -- Female partner(s)   Other Topics Concern  . Not on file   Social History Narrative  . No narrative on file   Health Maintenance  Topic Date Due  . Pap Smear  03/05/1998  . Tetanus/tdap  03/06/1999  . Influenza Vaccine  10/16/2010    The following portions of the patient's history were reviewed and updated as appropriate: allergies, current medications, past family history, past medical history, past social history, past surgical history and problem list.  Review of Systems Pertinent items are noted in HPI.   Objective:    BP 117/77  Pulse 86  Temp(Src) 98.5 F (36.9 C) (Oral)  Resp 16  Ht 5\' 7"  (1.702 m)  Wt 251 lb (113.853 kg)  BMI 39.31 kg/m2  LMP 12/27/2010 General appearance: alert, cooperative, appears stated age and moderately obese Head: Normocephalic, without obvious abnormality, atraumatic Throat: lips, mucosa, and tongue normal; teeth and gums normal Lungs: clear to auscultation bilaterally Breasts: normal appearance, no masses or tenderness Heart: regular rate and rhythm, S1, S2 normal, no murmur, click, rub or gallop Abdomen: soft, non-tender; bowel sounds normal; no masses,  no organomegaly Pelvic: cervix normal in appearance, external genitalia normal, no adnexal masses or tenderness, no cervical motion tenderness, rectovaginal septum  normal, vagina normal without discharge and uterus and adnexal area difficult to palpate secondary to weight.   Neurologic: Alert and oriented X 3, normal strength and tone. Normal symmetric reflexes. Normal coordination and gait    Assessment:    Healthy female exam.     Plan:    Pap smear to lab GC/CT Will follow-up with results See After Visit Summary for Counseling Recommendations

## 2011-03-13 ENCOUNTER — Encounter: Payer: Self-pay | Admitting: Physician Assistant

## 2011-03-13 ENCOUNTER — Ambulatory Visit (INDEPENDENT_AMBULATORY_CARE_PROVIDER_SITE_OTHER): Payer: PRIVATE HEALTH INSURANCE | Admitting: Physician Assistant

## 2011-03-13 VITALS — BP 137/70 | HR 102 | Temp 98.6°F | Wt 257.0 lb

## 2011-03-13 DIAGNOSIS — J069 Acute upper respiratory infection, unspecified: Secondary | ICD-10-CM

## 2011-03-13 NOTE — Patient Instructions (Signed)
Viral sore throat. Consider cold eeze tablets with zinc, load up on Vitamin C, salt water gargles for sore throat, if exposed to a lot of dust consider an anti-histamine like Zyrtec. Use samples of Astepro 2 sprays each nostril daily.

## 2011-03-13 NOTE — Progress Notes (Signed)
  Subjective:    Patient ID: Charlene [name], female    DOB: 12-18-1980, 31 y.o.   MRN: [id number]  HPI Patient presents to the clinic with a sore throat x1day. She feels like she needs to drink a lot of water. Her sore throat is better during the day. She denies any exposure to strep throat but she has had a history of getting tonsillits frequently. She denies fever, SOB, wheezing, head congestion, sinus pressure, nausea or vomiting. She has had some nasal congestion. She took theraflu and it has not helped. She has been able to eat and she has an appetite. She has started to have a cough but not productive.      Review of Systems     Objective:   Physical Exam  Constitutional: She is oriented to person, place, and time. She appears well-developed and well-nourished.  HENT:  Head: Normocephalic and atraumatic.  Right Ear: External ear normal.  Left Ear: External ear normal.  Mouth/Throat: Oropharynx is clear and moist. No oropharyngeal exudate.       TM's normal. No maxillary tenderness. Turbinates erythematous and swollen.  Eyes: Conjunctivae are normal. Right eye exhibits no discharge. Left eye exhibits no discharge.  Neck: Neck supple.  Cardiovascular: Regular rhythm and normal heart sounds.        Tachycardia at 102.  Pulmonary/Chest: Effort normal and breath sounds normal. She has no wheezes.  Lymphadenopathy:    She has no cervical adenopathy.  Neurological: She is alert and oriented to person, place, and time.  Psychiatric: She has a normal mood and affect. Her behavior is normal.          Assessment & Plan:  URI-Suspect it to be viral. Consider cold eeze tablets with zinc, load up on Vitamin C, salt water gargles for sore throat, if exposed to a lot of dust consider an anti-histamine like Zyrtec. Use samples of Astepro 2 sprays each nostril daily.   Informed patient of need for CPE.

## 2011-03-16 ENCOUNTER — Telehealth: Payer: Self-pay | Admitting: *Deleted

## 2011-03-16 MED ORDER — AZITHROMYCIN 250 MG PO TABS: ORAL_TABLET | ORAL | Status: AC

## 2011-03-16 NOTE — Telephone Encounter (Signed)
Sent Zpak to pharmacy

## 2011-03-16 NOTE — Telephone Encounter (Signed)
Pt calls and states seen Tuesday. Sx's she is having is cough(dry) chest congestion, nasal congestion, H/A. Was told to call by you if sx's persisted.

## 2011-05-17 ENCOUNTER — Other Ambulatory Visit: Payer: Self-pay

## 2011-06-15 ENCOUNTER — Telehealth: Payer: Self-pay | Admitting: Obstetrics & Gynecology

## 2011-06-15 ENCOUNTER — Other Ambulatory Visit: Payer: Self-pay | Admitting: Obstetrics & Gynecology

## 2011-06-15 DIAGNOSIS — O2620 Pregnancy care for patient with recurrent pregnancy loss, unspecified trimester: Secondary | ICD-10-CM

## 2011-06-15 NOTE — Telephone Encounter (Signed)
Telephone call to patient with MFM appointment.  Patient did not answer, left message on patient's voice mail with appointment and directions to the MFM department at [facility].

## 2011-06-18 ENCOUNTER — Ambulatory Visit (HOSPITAL_COMMUNITY)
Admission: RE | Admit: 2011-06-18 | Discharge: 2011-06-18 | Disposition: A | Discharge status: Home / Self Care | Payer: BC Managed Care – PPO | Source: Ambulatory Visit | Attending: Obstetrics & Gynecology | Admitting: Obstetrics & Gynecology

## 2011-06-18 ENCOUNTER — Encounter (HOSPITAL_COMMUNITY): Payer: Self-pay

## 2011-06-18 VITALS — BP 132/75 | HR 85 | Wt 256.0 lb

## 2011-06-18 DIAGNOSIS — D6861 Antiphospholipid syndrome: Secondary | ICD-10-CM

## 2011-06-18 NOTE — Progress Notes (Signed)
MFM note  Charlene [name] is a 31 yo G5P0040 - EDD early Feb 2014 who recently conceived with ART.  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  Her Past OB history is as follows:  Early TAB age 51,  60- 8 week loss s/p D&C,  2011- ART with a "chemical pregnancy"/ early loss.  Did not require D&C, and 2013 -early loss s/p ART also "chemical pregnancy".  Due to history of 3 losses, she was started on baby Aspirin and had an anti-phospholipid antibody work up.  Her ANA returned positive at 1:40; Lupus anticoagulant was negative and anticardiolipin antibodies were weakly positive (IgM 13 (nl < 11 MPL/ml) and IgA 4 (<22 positive)). The patient is scheduled to have an early ultrasound next week with her REI physician.  PMH - Charlene (off all medications, last relapse was at the time of diagnosis), Elevated cholesterol.  Denies personal or family history of thromboemboloism  PSH - D&C  Social - ETOH - quit during pregnancy; Tobacco - quit approximately 1 year ago  Meds: Baby ASA, Prometrium, Endometrium, Estradiol, Vivelle patch, folic acid  Family hx - first cousin with Down syndrome, Mother with cerebellar atrophy  Impression/ Plan: 1) Reviewed lab work up and history with patient.  The diagnosis of antiphospholipid antibody syndrome requires at least one clinical and one laboratory criterion be met for the diagnosis.  The fact that the patient had 3 sequential early pregnancy losses < 10 weeks is sufficient for the clinical diagnosis; however, the laboratory diagnosis requires "anticardiolipin antibodies of IgG or IgM isotype present in medium or high titer (i.e. >40 GPL or MPL or greater than the 99th percentile) on two or more occasions at least 12 weeks apart."  The patients low positive IgM values do not meet this criterion.  Based on the patient's history, she does not meet the criterion for  anti-phospholipid antibody syndrome - we discussed some of the risks and benefits of Lovinox.  She off course wants everything done that she can to have a successful pregnancy.  If she were to start Lovinox, it would be for a presumptive diagnosis and its benefit would be uncertain based on current literature and expert recommendations.  2) hx of Multiple Sclerosis - patient is currently off all Charlene medications.  In general, the clinical course of Charlene improves during pregnancy, but exacerbations / relapses are more common post partum.  Thank you for this referral.  I spent approximately 30 minutes with this patient with over 50% of time spent in face-to-face counseling.

## 2011-07-10 ENCOUNTER — Ambulatory Visit (INDEPENDENT_AMBULATORY_CARE_PROVIDER_SITE_OTHER): Payer: BC Managed Care – PPO | Admitting: Obstetrics & Gynecology

## 2011-07-10 ENCOUNTER — Encounter: Payer: Self-pay | Admitting: Obstetrics & Gynecology

## 2011-07-10 VITALS — BP 140/82 | Temp 97.3°F | Wt 253.0 lb

## 2011-07-10 DIAGNOSIS — Z3480 Encounter for supervision of other normal pregnancy, unspecified trimester: Secondary | ICD-10-CM

## 2011-07-10 DIAGNOSIS — O30009 Twin pregnancy, unspecified number of placenta and unspecified number of amniotic sacs, unspecified trimester: Secondary | ICD-10-CM

## 2011-07-10 NOTE — Progress Notes (Signed)
   Subjective:    Charlene [name] is a [id number] [redacted]w[redacted]d being seen today for her first obstetrical visit.  Her obstetrical history is significant for MS, psoriasis, donnor egg IVF. Patient does intend to breast feed. Pregnancy history fully reviewed.  Patient reports spotting yesterday, mild nausea.  Filed Vitals:   07/10/11 1559  BP: 140/82  Temp: 97.3 F (36.3 C)  Weight: 114.76 kg (253 lb)    HISTORY: OB History    Grav Para Term Preterm Abortions TAB SAB Ect Mult Living   4 0   2 1 1    0     # Outc Date GA Lbr Len/2nd Wgt Sex Del Anes PTL Lv   1 TAB 1998           2 SAB 2010 [redacted]w[redacted]d          Comments: D&C   3 GRA            Comments: System Generated. Please review and update pregnancy details.   4 CUR              Past Medical History  Diagnosis Date  . MS (multiple sclerosis)   . Hyperlipidemia   . GERD (gastroesophageal reflux disease)   . Psoriasis   . Infertility associated with anovulation    Past Surgical History  Procedure Date  . Dilation and curettage of uterus 8/10   Family History  Problem Relation Age of Onset  . Heart attack Maternal Grandfather   . Cancer Maternal Grandmother     esophagus     Exam    Uterus:     Pelvic Exam:    Perineum: No Hemorrhoids   Vulva: psoriasis   Vagina:  normal mucosa   pH: n/a   Cervix: no cervical motion tenderness   Adnexa: no mass, fullness, tenderness   Bony Pelvis: average  System: Breast:  normal appearance, no masses or tenderness   Skin: normal coloration and turgor, no rashes    Neurologic: oriented, normal mood   Extremities: no erythema, induration, or nodules   HEENT sclera clear, anicteric   Mouth/Teeth mucous membranes moist, pharynx normal without lesions   Neck supple and no masses   Cardiovascular: regular rate and rhythm   Respiratory:  appears well, vitals normal, no respiratory distress, acyanotic, normal RR, chest clear, no wheezing, crepitations, rhonchi, normal symmetric air entry   Abdomen: normal findings: no masses palpable   Urinary: urethral meatus normal      Assessment:    Pregnancy: [id number] Patient Active Problem List  Diagnosis  . UNSPECIFIED VITAMIN D DEFICIENCY  . HYPERLIPIDEMIA  . OBESITY, UNSPECIFIED  . ADJUSTMENT DISORDER WITH ANXIETY  . MULTIPLE SCLEROSIS  . MIGRAINE HEADACHE  . VIRAL URI  . REFLUX ESOPHAGITIS  . LUMBAGO  . WEIGHT GAIN        Plan:     Initial labs drawn. Prenatal vitamins. Problem list reviewed and updated. Genetic Screening discussed First Screen: ordered.  Ultrasound discussed; fetal survey: will order at next visit.  Follow up in 4 weeks.  Extensive discussion on whether to use Lovenox or not.  Pt does not meet lab criteria for anti-phospholipid syndrome although she has had 4 miscarriages.  Pt had consult with MFM as well regarding her APLA labs and whether to use Lovenox. After weighing the risks, benefits,and alternatives to Lovenox she and her spouse have decided not to proceed with Lovenox.    Ziyah Cordoba H. 07/10/2011

## 2011-07-10 NOTE — Addendum Note (Signed)
Addended by: Granville Lewis on: 07/10/2011 05:27 PM   Modules accepted: Orders

## 2011-07-10 NOTE — Progress Notes (Signed)
p-88  IVF with Dr Elesa Hacker.  Pt has 2 IUP's   Pt did use donor eggs.

## 2011-07-11 NOTE — Addendum Note (Signed)
Addended by: Granville Lewis on: 07/11/2011 02:12 PM   Modules accepted: Orders

## 2011-07-13 ENCOUNTER — Other Ambulatory Visit: Payer: Self-pay | Admitting: Obstetrics & Gynecology

## 2011-07-13 MED ORDER — AMOXICILLIN 500 MG PO CAPS: 500.0000 mg | ORAL_CAPSULE | Freq: Three times a day (TID) | ORAL | Status: AC

## 2011-07-13 NOTE — Progress Notes (Signed)
Enterococcus UTI.  Rx with Amox.  Pt needs to be called to pick up prescription.

## 2011-07-14 LAB — OBSTETRIC PANEL
Antibody Screen: NEGATIVE
Basophils Absolute: 0 10*3/uL (ref 0.0–0.1)
Eosinophils Absolute: 0.1 10*3/uL (ref 0.0–0.7)
Eosinophils Relative: 1 % (ref 0–5)
HCT: 39.4 % (ref 36.0–46.0)
Lymphs Abs: 2.1 10*3/uL (ref 0.7–4.0)
MCH: 31.1 pg (ref 26.0–34.0)
MCV: 89.3 fL (ref 78.0–100.0)
Monocytes Absolute: 0.6 10*3/uL (ref 0.1–1.0)
Platelets: 313 10*3/uL (ref 150–400)
RDW: 13.2 % (ref 11.5–15.5)
Rubella: 354.2 IU/mL — ABNORMAL HIGH

## 2011-07-14 LAB — CULTURE, URINE COMPREHENSIVE: Colony Count: 60000

## 2011-07-16 ENCOUNTER — Telehealth: Payer: Self-pay | Admitting: *Deleted

## 2011-07-16 NOTE — Telephone Encounter (Signed)
LM on home phone that she had bacteria growing in her urine culture and a RX for Amoxil was sent to [facility] in [place].

## 2011-08-10 ENCOUNTER — Ambulatory Visit (HOSPITAL_COMMUNITY): Admission: RE | Admit: 2011-08-10 | Payer: BC Managed Care – PPO | Source: Ambulatory Visit

## 2011-08-10 ENCOUNTER — Ambulatory Visit (HOSPITAL_COMMUNITY): Payer: BC Managed Care – PPO

## 2011-08-10 ENCOUNTER — Other Ambulatory Visit (HOSPITAL_COMMUNITY): Payer: BC Managed Care – PPO

## 2011-08-10 ENCOUNTER — Ambulatory Visit (HOSPITAL_COMMUNITY)
Admission: RE | Admit: 2011-08-10 | Discharge: 2011-08-10 | Disposition: A | Discharge status: Home / Self Care | Payer: BC Managed Care – PPO | Source: Ambulatory Visit | Attending: Obstetrics & Gynecology | Admitting: Obstetrics & Gynecology

## 2011-08-10 ENCOUNTER — Ambulatory Visit (INDEPENDENT_AMBULATORY_CARE_PROVIDER_SITE_OTHER): Payer: BC Managed Care – PPO | Admitting: Physician Assistant

## 2011-08-10 ENCOUNTER — Encounter (HOSPITAL_COMMUNITY): Payer: Self-pay

## 2011-08-10 ENCOUNTER — Other Ambulatory Visit: Payer: Self-pay | Admitting: Obstetrics & Gynecology

## 2011-08-10 VITALS — BP 126/85 | Temp 98.0°F | Wt 250.0 lb

## 2011-08-10 VITALS — BP 132/87 | HR 115 | Wt 253.5 lb

## 2011-08-10 DIAGNOSIS — O0990 Supervision of high risk pregnancy, unspecified, unspecified trimester: Secondary | ICD-10-CM | POA: Insufficient documentation

## 2011-08-10 DIAGNOSIS — E669 Obesity, unspecified: Secondary | ICD-10-CM

## 2011-08-10 DIAGNOSIS — O30049 Twin pregnancy, dichorionic/diamniotic, unspecified trimester: Secondary | ICD-10-CM | POA: Insufficient documentation

## 2011-08-10 DIAGNOSIS — O30009 Twin pregnancy, unspecified number of placenta and unspecified number of amniotic sacs, unspecified trimester: Secondary | ICD-10-CM

## 2011-08-10 DIAGNOSIS — G35 Multiple sclerosis: Secondary | ICD-10-CM

## 2011-08-10 DIAGNOSIS — O351XX0 Maternal care for (suspected) chromosomal abnormality in fetus, not applicable or unspecified: Secondary | ICD-10-CM | POA: Insufficient documentation

## 2011-08-10 DIAGNOSIS — O3510X0 Maternal care for (suspected) chromosomal abnormality in fetus, unspecified, not applicable or unspecified: Secondary | ICD-10-CM | POA: Insufficient documentation

## 2011-08-10 DIAGNOSIS — G35D Multiple sclerosis, unspecified: Secondary | ICD-10-CM

## 2011-08-10 DIAGNOSIS — O99210 Obesity complicating pregnancy, unspecified trimester: Secondary | ICD-10-CM | POA: Insufficient documentation

## 2011-08-10 DIAGNOSIS — Z3689 Encounter for other specified antenatal screening: Secondary | ICD-10-CM | POA: Insufficient documentation

## 2011-08-10 NOTE — Patient Instructions (Signed)
Pregnancy - Second Trimester The second trimester of pregnancy (3 to 6 months) is a period of rapid growth for you and your baby. At the end of the sixth month, your baby is about 9 inches long and weighs 1 1/2 pounds. You will begin to feel the baby move between 18 and 20 weeks of the pregnancy. This is called quickening. Weight gain is faster. A clear fluid (colostrum) may leak out of your breasts. You may feel small contractions of the womb (uterus). This is known as false labor or Braxton-Hicks contractions. This is like a practice for labor when the baby is ready to be born. Usually, the problems with morning sickness have usually passed by the end of your first trimester. Some women develop small dark blotches (called cholasma, mask of pregnancy) on their face that usually goes away after the baby is born. Exposure to the sun makes the blotches worse. Acne may also develop in some pregnant women and pregnant women who have acne, may find that it goes away. PRENATAL EXAMS  Blood work may continue to be done during prenatal exams. These tests are done to check on your health and the probable health of your baby. Blood work is used to follow your blood levels (hemoglobin). Anemia (low hemoglobin) is common during pregnancy. Iron and vitamins are given to help prevent this. You will also be checked for diabetes between 24 and 28 weeks of the pregnancy. Some of the previous blood tests may be repeated.   The size of the uterus is measured during each visit. This is to make sure that the baby is continuing to grow properly according to the dates of the pregnancy.   Your blood pressure is checked every prenatal visit. This is to make sure you are not getting toxemia.   Your urine is checked to make sure you do not have an infection, diabetes or protein in the urine.   Your weight is checked often to make sure gains are happening at the suggested rate. This is to ensure that both you and your baby are  growing normally.   Sometimes, an ultrasound is performed to confirm the proper growth and development of the baby. This is a test which bounces harmless sound waves off the baby so your caregiver can more accurately determine due dates.  Sometimes, a specialized test is done on the amniotic fluid surrounding the baby. This test is called an amniocentesis. The amniotic fluid is obtained by sticking a needle into the belly (abdomen). This is done to check the chromosomes in instances where there is a concern about possible genetic problems with the baby. It is also sometimes done near the end of pregnancy if an early delivery is required. In this case, it is done to help make sure the baby's lungs are mature enough for the baby to live outside of the womb. CHANGES OCCURING IN THE SECOND TRIMESTER OF PREGNANCY Your body goes through many changes during pregnancy. They vary from person to person. Talk to your caregiver about changes you notice that you are concerned about.  During the second trimester, you will likely have an increase in your appetite. It is normal to have cravings for certain foods. This varies from person to person and pregnancy to pregnancy.   Your lower abdomen will begin to bulge.   You may have to urinate more often because the uterus and baby are pressing on your bladder. It is also common to get more bladder infections during pregnancy (  pain with urination). You can help this by drinking lots of fluids and emptying your bladder before and after intercourse.   You may begin to get stretch marks on your hips, abdomen, and breasts. These are normal changes in the body during pregnancy. There are no exercises or medications to take that prevent this change.   You may begin to develop swollen and bulging veins (varicose veins) in your legs. Wearing support hose, elevating your feet for 15 minutes, 3 to 4 times a day and limiting salt in your diet helps lessen the problem.    Heartburn may develop as the uterus grows and pushes up against the stomach. Antacids recommended by your caregiver helps with this problem. Also, eating smaller meals 4 to 5 times a day helps.   Constipation can be treated with a stool softener or adding bulk to your diet. Drinking lots of fluids, vegetables, fruits, and whole grains are helpful.   Exercising is also helpful. If you have been very active up until your pregnancy, most of these activities can be continued during your pregnancy. If you have been less active, it is helpful to start an exercise program such as walking.   Hemorrhoids (varicose veins in the rectum) may develop at the end of the second trimester. Warm sitz baths and hemorrhoid cream recommended by your caregiver helps hemorrhoid problems.   Backaches may develop during this time of your pregnancy. Avoid heavy lifting, wear low heal shoes and practice good posture to help with backache problems.   Some pregnant women develop tingling and numbness of their hand and fingers because of swelling and tightening of ligaments in the wrist (carpel tunnel syndrome). This goes away after the baby is born.   As your breasts enlarge, you may have to get a bigger bra. Get a comfortable, cotton, support bra. Do not get a nursing bra until the last month of the pregnancy if you will be nursing the baby.   You may get a dark line from your belly button to the pubic area called the linea nigra.   You may develop rosy cheeks because of increase blood flow to the face.   You may develop spider looking lines of the face, neck, arms and chest. These go away after the baby is born.  HOME CARE INSTRUCTIONS   It is extremely important to avoid all smoking, herbs, alcohol, and unprescribed drugs during your pregnancy. These chemicals affect the formation and growth of the baby. Avoid these chemicals throughout the pregnancy to ensure the delivery of a healthy infant.   Most of your home  care instructions are the same as suggested for the first trimester of your pregnancy. Keep your caregiver's appointments. Follow your caregiver's instructions regarding medication use, exercise and diet.   During pregnancy, you are providing food for you and your baby. Continue to eat regular, well-balanced meals. Choose foods such as meat, fish, milk and other low fat dairy products, vegetables, fruits, and whole-grain breads and cereals. Your caregiver will tell you of the ideal weight gain.   A physical sexual relationship may be continued up until near the end of pregnancy if there are no other problems. Problems could include early (premature) leaking of amniotic fluid from the membranes, vaginal bleeding, abdominal pain, or other medical or pregnancy problems.   Exercise regularly if there are no restrictions. Check with your caregiver if you are unsure of the safety of some of your exercises. The greatest weight gain will occur in the   last 2 trimesters of pregnancy. Exercise will help you:   Control your weight.   Get you in shape for labor and delivery.   Lose weight after you have the baby.   Wear a good support or jogging bra for breast tenderness during pregnancy. This may help if worn during sleep. Pads or tissues may be used in the bra if you are leaking colostrum.   Do not use hot tubs, steam rooms or saunas throughout the pregnancy.   Wear your seat belt at all times when driving. This protects you and your baby if you are in an accident.   Avoid raw meat, uncooked cheese, cat litter boxes and soil used by cats. These carry germs that can cause birth defects in the baby.   The second trimester is also a good time to visit your dentist for your dental health if this has not been done yet. Getting your teeth cleaned is OK. Use a soft toothbrush. Brush gently during pregnancy.   It is easier to loose urine during pregnancy. Tightening up and strengthening the pelvic muscles will  help with this problem. Practice stopping your urination while you are going to the bathroom. These are the same muscles you need to strengthen. It is also the muscles you would use as if you were trying to stop from passing gas. You can practice tightening these muscles up 10 times a set and repeating this about 3 times per day. Once you know what muscles to tighten up, do not perform these exercises during urination. It is more likely to contribute to an infection by backing up the urine.   Ask for help if you have financial, counseling or nutritional needs during pregnancy. Your caregiver will be able to offer counseling for these needs as well as refer you for other special needs.   Your skin may become oily. If so, wash your face with mild soap, use non-greasy moisturizer and oil or cream based makeup.  MEDICATIONS AND DRUG USE IN PREGNANCY  Take prenatal vitamins as directed. The vitamin should contain 1 milligram of folic acid. Keep all vitamins out of reach of children. Only a couple vitamins or tablets containing iron may be fatal to a baby or young child when ingested.   Avoid use of all medications, including herbs, over-the-counter medications, not prescribed or suggested by your caregiver. Only take over-the-counter or prescription medicines for pain, discomfort, or fever as directed by your caregiver. Do not use aspirin.   Let your caregiver also know about herbs you may be using.   Alcohol is related to a number of birth defects. This includes fetal alcohol syndrome. All alcohol, in any form, should be avoided completely. Smoking will cause low birth rate and premature babies.   Street or illegal drugs are very harmful to the baby. They are absolutely forbidden. A baby born to an addicted mother will be addicted at birth. The baby will go through the same withdrawal an adult does.  SEEK MEDICAL CARE IF:  You have any concerns or worries during your pregnancy. It is better to call with  your questions if you feel they cannot wait, rather than worry about them. SEEK IMMEDIATE MEDICAL CARE IF:   An unexplained oral temperature above 102 F (38.9 C) develops, or as your caregiver suggests.   You have leaking of fluid from the vagina (birth canal). If leaking membranes are suspected, take your temperature and tell your caregiver of this when you call.   There   is vaginal spotting, bleeding, or passing clots. Tell your caregiver of the amount and how many pads are used. Light spotting in pregnancy is common, especially following intercourse.   You develop a bad smelling vaginal discharge with a change in the color from clear to white.   You continue to feel sick to your stomach (nauseated) and have no relief from remedies suggested. You vomit blood or coffee ground-like materials.   You lose more than 2 pounds of weight or gain more than 2 pounds of weight over 1 week, or as suggested by your caregiver.   You notice swelling of your face, hands, feet, or legs.   You get exposed to German measles and have never had them.   You are exposed to fifth disease or chickenpox.   You develop belly (abdominal) pain. Round ligament discomfort is a common non-cancerous (benign) cause of abdominal pain in pregnancy. Your caregiver still must evaluate you.   You develop a bad headache that does not go away.   You develop fever, diarrhea, pain with urination, or shortness of breath.   You develop visual problems, blurry, or double vision.   You fall or are in a car accident or any kind of trauma.   There is mental or physical violence at home.  Document Released: 12/26/2000 Document Revised: 12/21/2010 Document Reviewed: 06/30/2008 [employer] Patient Information 2012 [employer]. 

## 2011-08-10 NOTE — Progress Notes (Signed)
Viable twin gestation at 12 weeks. + FM and cardiac activity on [place] today. Anticipatory guidance. Requests 2 week FU appt to confirm viability related to PAIRS program with IVF. MFM today for first screen.

## 2011-08-10 NOTE — Addendum Note (Signed)
Encounter addended by: [name], MD on: 08/10/2011  5:03 PM<BR>     Documentation filed: Notes Section

## 2011-08-10 NOTE — Progress Notes (Signed)
Charlene [name] was seen for ultrasound appointment today.  Please see [facility] report for details.  

## 2011-08-10 NOTE — Progress Notes (Signed)
p=95 

## 2011-08-13 ENCOUNTER — Other Ambulatory Visit: Payer: Self-pay | Admitting: Family

## 2011-08-13 MED ORDER — CYCLOBENZAPRINE HCL 10 MG PO TABS: 10.0000 mg | ORAL_TABLET | Freq: Three times a day (TID) | ORAL | Status: AC | PRN

## 2011-08-13 MED ORDER — ACETAMINOPHEN-CODEINE 300-30 MG PO TABS: 1.0000 | ORAL_TABLET | ORAL | Status: AC | PRN

## 2011-08-13 NOTE — Progress Notes (Signed)
Pt calls with report of migraine headache with minimal relief; did not go to [facility] due to pt knowing it was a typical migrain +nausea, +photophobia; RX called in for Flexeril, Tylenol#3 available if no relief from Flexeril.

## 2011-08-17 ENCOUNTER — Ambulatory Visit (HOSPITAL_COMMUNITY)
Admission: RE | Admit: 2011-08-17 | Discharge: 2011-08-17 | Disposition: A | Discharge status: Home / Self Care | Payer: BC Managed Care – PPO | Source: Ambulatory Visit | Attending: Obstetrics & Gynecology | Admitting: Obstetrics & Gynecology

## 2011-08-17 ENCOUNTER — Encounter (HOSPITAL_COMMUNITY): Payer: Self-pay

## 2011-08-17 ENCOUNTER — Other Ambulatory Visit: Payer: Self-pay

## 2011-08-17 ENCOUNTER — Other Ambulatory Visit: Payer: Self-pay | Admitting: Obstetrics & Gynecology

## 2011-08-17 VITALS — BP 141/96 | HR 95 | Wt 253.5 lb

## 2011-08-17 DIAGNOSIS — O99210 Obesity complicating pregnancy, unspecified trimester: Secondary | ICD-10-CM

## 2011-08-17 DIAGNOSIS — IMO0001 Reserved for inherently not codable concepts without codable children: Secondary | ICD-10-CM

## 2011-08-17 DIAGNOSIS — O30009 Twin pregnancy, unspecified number of placenta and unspecified number of amniotic sacs, unspecified trimester: Secondary | ICD-10-CM | POA: Insufficient documentation

## 2011-08-17 DIAGNOSIS — O0990 Supervision of high risk pregnancy, unspecified, unspecified trimester: Secondary | ICD-10-CM

## 2011-08-17 DIAGNOSIS — G35 Multiple sclerosis: Secondary | ICD-10-CM

## 2011-08-17 DIAGNOSIS — O3510X0 Maternal care for (suspected) chromosomal abnormality in fetus, unspecified, not applicable or unspecified: Secondary | ICD-10-CM | POA: Insufficient documentation

## 2011-08-17 DIAGNOSIS — O30049 Twin pregnancy, dichorionic/diamniotic, unspecified trimester: Secondary | ICD-10-CM

## 2011-08-17 DIAGNOSIS — Z3689 Encounter for other specified antenatal screening: Secondary | ICD-10-CM | POA: Insufficient documentation

## 2011-08-17 DIAGNOSIS — O351XX0 Maternal care for (suspected) chromosomal abnormality in fetus, not applicable or unspecified: Secondary | ICD-10-CM | POA: Insufficient documentation

## 2011-08-17 DIAGNOSIS — Z3682 Encounter for antenatal screening for nuchal translucency: Secondary | ICD-10-CM

## 2011-08-20 ENCOUNTER — Encounter: Payer: Self-pay | Admitting: Obstetrics & Gynecology

## 2011-08-21 ENCOUNTER — Telehealth: Payer: Self-pay | Admitting: *Deleted

## 2011-08-21 ENCOUNTER — Encounter: Payer: Self-pay | Admitting: Nurse Practitioner

## 2011-08-21 ENCOUNTER — Ambulatory Visit (INDEPENDENT_AMBULATORY_CARE_PROVIDER_SITE_OTHER): Payer: BC Managed Care – PPO | Admitting: Nurse Practitioner

## 2011-08-21 VITALS — BP 123/76 | HR 101 | Temp 97.5°F | Resp 17 | Ht 67.0 in | Wt 251.0 lb

## 2011-08-21 DIAGNOSIS — G43909 Migraine, unspecified, not intractable, without status migrainosus: Secondary | ICD-10-CM

## 2011-08-21 DIAGNOSIS — R112 Nausea with vomiting, unspecified: Secondary | ICD-10-CM

## 2011-08-21 DIAGNOSIS — G43711 Chronic migraine without aura, intractable, with status migrainosus: Secondary | ICD-10-CM

## 2011-08-21 MED ORDER — PROMETHAZINE HCL 25 MG PO TABS: 25.0000 mg | ORAL_TABLET | Freq: Four times a day (QID) | ORAL | Status: DC | PRN

## 2011-08-21 MED ORDER — ONDANSETRON HCL 4 MG PO TABS: 4.0000 mg | ORAL_TABLET | Freq: Three times a day (TID) | ORAL | Status: AC | PRN

## 2011-08-21 MED ORDER — SUMATRIPTAN 5 MG/ACT NA SOLN: 1.0000 | NASAL | Status: DC | PRN

## 2011-08-21 MED ORDER — RIZATRIPTAN BENZOATE 10 MG PO TABS: 10.0000 mg | ORAL_TABLET | Freq: Once | ORAL | Status: DC | PRN

## 2011-08-21 NOTE — Progress Notes (Signed)
Diagnosis: Migraine without Aura  History: Pt is in office today for migraine consult. She has had migraine without aura since childhood and remembers them getting worse with her teen years. She has done well with Imitrex Nasal sprays when she gets a migraine until she became pregnant. She is G4 T0P0A2L0. Pregnant with twins. She has had IVF with donor eggs. She has a medical history of anxiety,MS, Elevated lipids,GERD,Psoriasis. She is quite stressed with pregnancy and moving her house.   Location: Right temple  Number of Headache days/month: Severe: 4/ that last 3-4 days Moderate:0 Mild:0  Current Outpatient Prescriptions on File Prior to Visit  Medication Sig Dispense Refill  . Acetaminophen-Codeine (TYLENOL/CODEINE #3) 300-30 MG per tablet Take 1 tablet by mouth every 4 (four) hours as needed for pain.  30 tablet  0  . aspirin 81 MG tablet Take 81 mg by mouth daily.      . cyclobenzaprine (FLEXERIL) 10 MG tablet Take 1 tablet (10 mg total) by mouth every 8 (eight) hours as needed for muscle spasms.  30 tablet  1  . estradiol (ESTRACE) 1 MG tablet Take 1 mg by mouth 2 (two) times daily.      Marland Kitchen estradiol (VIVELLE-DOT) 0.025 MG/24HR Place 1 patch onto the skin 2 (two) times a week.      . folic acid (FOLVITE) 800 MCG tablet Take 400 mcg by mouth daily.      . progesterone (PROMETRIUM) 200 MG capsule       . promethazine (PHENERGAN) 25 MG tablet Take 1 tablet (25 mg total) by mouth every 6 (six) hours as needed for nausea.  30 tablet  2  . SUMAtriptan (IMITREX) 5 MG/ACT nasal spray Place 1 spray (5 mg total) into the nose every 2 (two) hours as needed for migraine.  4 Inhaler  3    Acute prevention: Has been taking Tylenol# 3, Flexeril, B6  Past Medical History  Diagnosis Date  . MS (multiple sclerosis)   . Hyperlipidemia   . GERD (gastroesophageal reflux disease)   . Psoriasis   . Infertility associated with anovulation    Past Surgical History  Procedure Date  . Dilation and  curettage of uterus 8/10   Family History  Problem Relation Age of Onset  . Heart attack Maternal Grandfather   . Cancer Maternal Grandmother     esophagus   Social History:  reports that she quit smoking about 13 months ago. Her smoking use included Cigarettes. She has a 15 pack-year smoking history. She has never used smokeless tobacco. She reports that she drinks alcohol. She reports that she does not use illicit drugs. Allergies: No Known Allergies  Triggers: Stress  Birth control: Pregnancy  ROS: Positive for anxiety, migraine, nausea, vomiting  Exam: Well developed,well nourished caucasian,pregnant female  General: Tearful, in pain HEENT: Tenderness in right temple Cardiac: RRR Lungs: Clear Neuro: Negative Skin: Warm and dry  Procedure: Trigger Point Injection today 3 cc in right temple regions. Pt tolerated is with some discomfort. She admitted she felt better after injections.Advised to ice tonight Usual solution was used 1cc prednisone,2cc lidocaine,2cc marcaine  Impression:migraine - common  Plan We discussed the risk/ benefit ratio of triptan medications in pregnancy. She is aware of the risk and is willing to assume risk as her pain is very intense. We also did Trigger Point Injections in [place] and that gave her some relief. She was not able to tolerate the full 5 cc. She admits she is intolerant of  pain and is advised to do some pregnancy yoga to help focus her mind when she has pain. We will also have her use zofran/ phenergan for nausea with headache. She can take the Flexeril at bedtime to help with sleep and muscle spasm. She is asked to return on an as needed basis.    Time Spent:45 minutes

## 2011-08-21 NOTE — Patient Instructions (Signed)

## 2011-08-21 NOTE — Telephone Encounter (Signed)
Pt called stating that normally her insurance covers the Imitrex Spray but the pharmacy said there was a back order on the spray and whatever they were going to give her was going to be 240.00.  She requesting something else.  Spoke with Midge Aver, NP who ordered Maxalt 10mg  to be sent to [facility] in [place].

## 2011-08-23 ENCOUNTER — Ambulatory Visit (INDEPENDENT_AMBULATORY_CARE_PROVIDER_SITE_OTHER): Payer: BC Managed Care – PPO | Admitting: Obstetrics & Gynecology

## 2011-08-23 ENCOUNTER — Telehealth (HOSPITAL_COMMUNITY): Payer: Self-pay | Admitting: MS"

## 2011-08-23 DIAGNOSIS — O30009 Twin pregnancy, unspecified number of placenta and unspecified number of amniotic sacs, unspecified trimester: Secondary | ICD-10-CM

## 2011-08-23 DIAGNOSIS — O09899 Supervision of other high risk pregnancies, unspecified trimester: Secondary | ICD-10-CM

## 2011-08-23 DIAGNOSIS — Z3480 Encounter for supervision of other normal pregnancy, unspecified trimester: Secondary | ICD-10-CM

## 2011-08-23 NOTE — Telephone Encounter (Signed)
Called Ms. Charlene [name] to explain that cell free fetal DNA testing ([facility]) drawn on 08/17/11 is not able analyze X and Y in a twin gestation. Thus, fetal gender information will not be provided from her [facility] result. [name] expressed her disappointment but understanding. Reviewed that cell free fetal DNA testing assess for Down syndrome, Trisomy 18, and Trisomy 13. It does not diagnose or rule out these conditions but provided a pregnancy specific risk assessment. Given her age alone (age of egg donor), the pregnancy is not considered to be at increased risk for fetal aneuploidy. Explained that I will call her with results once available.   Clydie Braun Tashika Goodin  08/23/2011 3:07 PM

## 2011-08-23 NOTE — Telephone Encounter (Signed)
Left message for patient that I was calling to give her some information about the labwork that was done at her visit, since I was not in the office the day she was here. Asked patient to return call.   Charlene [name] 08/23/2011 11:25 AM

## 2011-08-23 NOTE — Progress Notes (Signed)
Fh x2 by [place].  NO complaints.  Has cfDNA pending through MFM.  Migraines better.  S/p injection.  Recommend Excedrin Migraine prn for headaches up to 32 weeks.  Pt takes about 2x/wk.

## 2011-08-23 NOTE — Progress Notes (Signed)
Post coital bleeding week ago.

## 2011-08-27 ENCOUNTER — Telehealth (HOSPITAL_COMMUNITY): Payer: Self-pay | Admitting: MS"

## 2011-08-27 NOTE — Telephone Encounter (Signed)
Called Charlene [name] to discuss her [place], cell free fetal DNA testing.  We reviewed that these are within normal limits, showing a less than 1 in 10,000 risk for trisomies 21, 18 and 13. She understands that this testing does not identify all genetic conditions and understands that fetal X and Y analysis is not available in a twin gestation via this testing at this time. All questions were answered to her satisfaction, she was encouraged to call with additional questions or concerns.  Quinn Plowman, MS Patent attorney

## 2011-09-20 ENCOUNTER — Ambulatory Visit: Payer: BC Managed Care – PPO | Admitting: Obstetrics & Gynecology

## 2011-09-20 VITALS — BP 119/79 | Temp 98.2°F | Wt 252.0 lb

## 2011-09-20 DIAGNOSIS — O99210 Obesity complicating pregnancy, unspecified trimester: Secondary | ICD-10-CM

## 2011-09-20 DIAGNOSIS — O0990 Supervision of high risk pregnancy, unspecified, unspecified trimester: Secondary | ICD-10-CM

## 2011-09-20 MED ORDER — ZOLPIDEM TARTRATE 10 MG PO TABS: 10.0000 mg | ORAL_TABLET | Freq: Every evening | ORAL | Status: DC | PRN

## 2011-09-20 NOTE — Progress Notes (Signed)
Pt is having 2 girls.  Waking with hands being numb.  p-111

## 2011-09-20 NOTE — Progress Notes (Signed)
Problems sleeping--already takes benadryl.   Will try ambien.  Had bad ehadache 2 weeks ago-took imitrex but di n't work.  Excedrin migraine works sometimes.  Right shoulder pain--wants to go to chiropractor.  Early GCT due to risk factors..  No weight gain--referral to nutrition.  Anatomy US next week.  +FH by [place] today x2.

## 2011-09-26 ENCOUNTER — Encounter: Payer: Self-pay | Admitting: *Deleted

## 2011-09-26 ENCOUNTER — Encounter: Payer: BC Managed Care – PPO | Attending: Obstetrics & Gynecology | Admitting: *Deleted

## 2011-09-26 DIAGNOSIS — Z713 Dietary counseling and surveillance: Secondary | ICD-10-CM | POA: Insufficient documentation

## 2011-09-26 DIAGNOSIS — O99810 Abnormal glucose complicating pregnancy: Secondary | ICD-10-CM | POA: Insufficient documentation

## 2011-09-26 NOTE — Progress Notes (Signed)
  Medical Nutrition Therapy:  Appt start time: 1130 end time:  1230.  Assessment:  Primary concerns today: patient here for nutrition counseling while pregnant with twins without weight gain. She states she is [redacted] weeks pregnant with EDD 02-22-12. She complains of general fatigue, difficulty sleeping, no interest in eating and occasional feelings of nausea along with headaches and history of M. S.  She states she works 2 part time jobs for total of about 20 hours a week. Her husband and she share in food preparation, eating out a couple of times a week.   MEDICATIONS: see list   DIETARY INTAKE:  Usual eating pattern includes 2-3 meals and 0 snacks per day.  Everyday foods include some variety of most food groups.  Avoided foods include many vegetables, caffeine, alcohol.    24-hr recall:  B ( AM): granola bar, diet soda Snk ( AM): none L ( PM): not hungry so grabs what won't upset her stomach: pasta with cheese, [place] Sandwich with pastry Snk ( PM) none OR granola bar to prevent headache D ( PM): restaurants or order out twice a week, cook meat, starch, infrequent vegetables, occasionally bread, flavored water, fruit juice  Snk ( PM): none Beverages: water, flavored water, one diet soda in AM to help prevent headaches  Usual physical activity: walked in beginning of pregnancy, intolerant to heat with MS. Plans to increase with cooler weather  Estimated energy needs: 2000 calories 225 g carbohydrates 150 g protein 56 g fat  Progress Towards Goal(s):  In progress.   Nutritional Diagnosis:  NI-1.4 Inadequate energy intake As related to pregnancy with twins.  As evidenced by no weight gain at 20 weeks..    Intervention:  Nutrition counseling initiated. Recommend use of Carnation Essentials powder mixed in milk once a day to add 200 calories to current food intake as a snack without having to eat more food. Also suggested use of Boost as alternative supplement that is more convenient to  carry with her. She states she is in process of having Glucose Tolerance Test so based on those results I recommend a follow up visit to assess progress.  Handouts given during visit include:  Diplomatic Services operational officer both regular and No Sugar Added  Monitoring/Evaluation:  Dietary intake, exercise, Supplement intake, and body weight in 2 - 4 week(s).

## 2011-09-27 ENCOUNTER — Telehealth: Payer: Self-pay | Admitting: *Deleted

## 2011-09-27 ENCOUNTER — Ambulatory Visit (HOSPITAL_COMMUNITY)
Admission: RE | Admit: 2011-09-27 | Discharge: 2011-09-27 | Disposition: A | Discharge status: Home / Self Care | Payer: BC Managed Care – PPO | Source: Ambulatory Visit | Attending: Obstetrics & Gynecology | Admitting: Obstetrics & Gynecology

## 2011-09-27 VITALS — BP 124/84 | HR 108 | Wt 254.5 lb

## 2011-09-27 DIAGNOSIS — Z1389 Encounter for screening for other disorder: Secondary | ICD-10-CM | POA: Insufficient documentation

## 2011-09-27 DIAGNOSIS — O99210 Obesity complicating pregnancy, unspecified trimester: Secondary | ICD-10-CM

## 2011-09-27 DIAGNOSIS — E669 Obesity, unspecified: Secondary | ICD-10-CM | POA: Insufficient documentation

## 2011-09-27 DIAGNOSIS — O0990 Supervision of high risk pregnancy, unspecified, unspecified trimester: Secondary | ICD-10-CM

## 2011-09-27 DIAGNOSIS — O358XX0 Maternal care for other (suspected) fetal abnormality and damage, not applicable or unspecified: Secondary | ICD-10-CM | POA: Insufficient documentation

## 2011-09-27 DIAGNOSIS — G35 Multiple sclerosis: Secondary | ICD-10-CM

## 2011-09-27 DIAGNOSIS — O30009 Twin pregnancy, unspecified number of placenta and unspecified number of amniotic sacs, unspecified trimester: Secondary | ICD-10-CM | POA: Insufficient documentation

## 2011-09-27 DIAGNOSIS — Z363 Encounter for antenatal screening for malformations: Secondary | ICD-10-CM | POA: Insufficient documentation

## 2011-09-27 DIAGNOSIS — O30049 Twin pregnancy, dichorionic/diamniotic, unspecified trimester: Secondary | ICD-10-CM

## 2011-09-27 NOTE — Telephone Encounter (Signed)
LM on voicemail that her 1 hr GTT was abn and that she needed to call the office and schedule a fasting 3 hr GTT.  This is the second message that I have left.

## 2011-09-28 ENCOUNTER — Other Ambulatory Visit: Payer: BC Managed Care – PPO

## 2011-10-01 ENCOUNTER — Encounter: Payer: Self-pay | Admitting: Obstetrics & Gynecology

## 2011-10-01 DIAGNOSIS — O2610 Low weight gain in pregnancy, unspecified trimester: Secondary | ICD-10-CM | POA: Insufficient documentation

## 2011-10-03 ENCOUNTER — Other Ambulatory Visit: Payer: BC Managed Care – PPO

## 2011-10-04 ENCOUNTER — Encounter: Payer: Self-pay | Admitting: Obstetrics & Gynecology

## 2011-10-04 DIAGNOSIS — Z9189 Other specified personal risk factors, not elsewhere classified: Secondary | ICD-10-CM | POA: Insufficient documentation

## 2011-10-04 LAB — GLUCOSE TOLERANCE, 3 HOURS
Glucose Tolerance, Fasting: 89 mg/dL (ref 70–104)
Glucose, GTT - 3 Hour: 122 mg/dL (ref 70–144)

## 2011-10-08 ENCOUNTER — Telehealth: Payer: Self-pay | Admitting: *Deleted

## 2011-10-08 NOTE — Telephone Encounter (Signed)
Message copied by Granville Lewis on Mon Oct 08, 2011 11:53 AM ------      Message from: Lesly Dukes      Created: Thu Oct 04, 2011  9:34 AM       One abnml value on GTT.  Pt needs rpt GTT at 28 weeks.  Start low carb diet.  Check fastings and random CBG at visits.

## 2011-10-08 NOTE — Telephone Encounter (Signed)
Spoke with pt about 3 hr GTT results.  She is to start watching her carb intake and she is aware that she will do a 1 hr GTT @ 28 weeks.

## 2011-10-09 ENCOUNTER — Encounter: Payer: Self-pay | Admitting: Obstetrics & Gynecology

## 2011-10-18 ENCOUNTER — Encounter: Payer: Self-pay | Admitting: Obstetrics & Gynecology

## 2011-10-18 ENCOUNTER — Ambulatory Visit (INDEPENDENT_AMBULATORY_CARE_PROVIDER_SITE_OTHER): Payer: BC Managed Care – PPO | Admitting: Obstetrics & Gynecology

## 2011-10-18 VITALS — BP 136/91 | Wt 257.0 lb

## 2011-10-18 DIAGNOSIS — O0990 Supervision of high risk pregnancy, unspecified, unspecified trimester: Secondary | ICD-10-CM

## 2011-10-18 DIAGNOSIS — O30009 Twin pregnancy, unspecified number of placenta and unspecified number of amniotic sacs, unspecified trimester: Secondary | ICD-10-CM

## 2011-10-18 NOTE — Progress Notes (Signed)
Patient is having some cramping that started yesterday, she does not feel as if she is feeling the babies move yet.  Hands are swelling more, worse at night.  Some swelling in feet as well.

## 2011-10-18 NOTE — Progress Notes (Signed)
Complaining of carpel tunnel symptoms.  Wears brace on right hand without much relief.  Will refer to orthopedics.  Pt finally gained 5 pounds.  CBG today = 121 (just ate) Has scan at MFM; FHT x2 visualized on [place]. Rpt BP=128/80.  No headaches recently.

## 2011-10-24 ENCOUNTER — Other Ambulatory Visit: Payer: Self-pay | Admitting: Obstetrics & Gynecology

## 2011-10-24 DIAGNOSIS — O30009 Twin pregnancy, unspecified number of placenta and unspecified number of amniotic sacs, unspecified trimester: Secondary | ICD-10-CM

## 2011-10-25 ENCOUNTER — Ambulatory Visit (HOSPITAL_COMMUNITY)
Admission: RE | Admit: 2011-10-25 | Discharge: 2011-10-25 | Disposition: A | Discharge status: Home / Self Care | Payer: BC Managed Care – PPO | Source: Ambulatory Visit | Attending: Obstetrics & Gynecology | Admitting: Obstetrics & Gynecology

## 2011-10-25 VITALS — BP 125/73 | HR 98 | Wt 259.0 lb

## 2011-10-25 DIAGNOSIS — Z9189 Other specified personal risk factors, not elsewhere classified: Secondary | ICD-10-CM

## 2011-10-25 DIAGNOSIS — O30009 Twin pregnancy, unspecified number of placenta and unspecified number of amniotic sacs, unspecified trimester: Secondary | ICD-10-CM

## 2011-10-25 DIAGNOSIS — G35 Multiple sclerosis: Secondary | ICD-10-CM

## 2011-10-25 DIAGNOSIS — O0990 Supervision of high risk pregnancy, unspecified, unspecified trimester: Secondary | ICD-10-CM

## 2011-10-25 DIAGNOSIS — E669 Obesity, unspecified: Secondary | ICD-10-CM | POA: Insufficient documentation

## 2011-10-25 DIAGNOSIS — O99210 Obesity complicating pregnancy, unspecified trimester: Secondary | ICD-10-CM

## 2011-10-25 DIAGNOSIS — G35D Multiple sclerosis, unspecified: Secondary | ICD-10-CM

## 2011-10-25 DIAGNOSIS — O30049 Twin pregnancy, dichorionic/diamniotic, unspecified trimester: Secondary | ICD-10-CM | POA: Insufficient documentation

## 2011-10-25 NOTE — Progress Notes (Signed)
Ms. Shalanda Brogden had an ultrasound appointment today.  Please see AS-OB/GYN report for details.  Comments There is an active dichorionic-diamniotic twin pregnancy on today's routine anatomic re-examination.   Twin A: Active fetus The biometry suggests a fetus with an EFW at the approximately 55th percentile for gestational age.  Normal amniotic fluid volume No dysmorphic features noted on today's images  Twin B: Active fetus The biometry suggests a fetus with an EFW at the approximately 57th percentile for gestational age.  Normal amniotic fluid volume No dysmorphic features noted on today's images Imaging of the fetal heart remains suboptimal in context of the maternal insonating characteristics and warrants follow-up attempts on subsequent interval growth evaluation  Impressions Active di-di twin pair Concordant growth (2% discordance) Concordant amniotic fluid volume Imaging of the fetal heart remains suboptimal in context of the maternal insonating characteristics and warrants follow-up attempts on subsequent interval growth evaluation   Recommendations: 1. Continue to assess interval growth of twins every 3-4 weeks with attempt to complete the evaluation of fetal anatomy (heart of Twin B) at the next evaluation 2. Twice weekly NSTs and weekly AFI beginning at [redacted] weeks gestation 3. Tentativley plan delivery at 38 weeks for this twin pair, provided that testing remains reassuring in the interim. 4. MFM would be happy to provide recommendations regarding multiple sclerosis and pregnancy in consultation.  You may call to schedule consultation with our group if so desired.  Rogelia Boga, MD, MS, FACOG Assistant Professor Section of Maternal-Fetal Medicine [facility]

## 2011-10-29 ENCOUNTER — Other Ambulatory Visit: Payer: Self-pay | Admitting: Obstetrics & Gynecology

## 2011-10-29 DIAGNOSIS — G35 Multiple sclerosis: Secondary | ICD-10-CM

## 2011-10-29 NOTE — Progress Notes (Signed)
Nml growth at 23 weeks.  Nml fluid.  MFM consult for MS in pregnancy.  Pt has already been seen once at [facility] during first trimester by Dr. Claudean Severance.

## 2011-10-30 ENCOUNTER — Ambulatory Visit (INDEPENDENT_AMBULATORY_CARE_PROVIDER_SITE_OTHER): Payer: BC Managed Care – PPO | Admitting: Obstetrics & Gynecology

## 2011-10-30 VITALS — BP 136/83 | Temp 98.0°F | Wt 256.0 lb

## 2011-10-30 DIAGNOSIS — Z8742 Personal history of other diseases of the female genital tract: Secondary | ICD-10-CM | POA: Insufficient documentation

## 2011-10-30 DIAGNOSIS — N23 Unspecified renal colic: Secondary | ICD-10-CM

## 2011-10-30 DIAGNOSIS — O479 False labor, unspecified: Secondary | ICD-10-CM

## 2011-10-30 DIAGNOSIS — R309 Painful micturition, unspecified: Secondary | ICD-10-CM

## 2011-10-30 DIAGNOSIS — IMO0002 Reserved for concepts with insufficient information to code with codable children: Secondary | ICD-10-CM

## 2011-10-30 DIAGNOSIS — R87612 Low grade squamous intraepithelial lesion on cytologic smear of cervix (LGSIL): Secondary | ICD-10-CM

## 2011-10-30 HISTORY — DX: Reserved for concepts with insufficient information to code with codable children: IMO0002

## 2011-10-30 HISTORY — DX: Low grade squamous intraepithelial lesion on cytologic smear of cervix (LGSIL): R87.612

## 2011-10-30 LAB — POCT URINALYSIS DIPSTICK
Bilirubin, UA: NEGATIVE
Blood, UA: NEGATIVE
Glucose, UA: NEGATIVE
Leukocytes, UA: NEGATIVE
Nitrite, UA: NEGATIVE
Urobilinogen, UA: NEGATIVE

## 2011-10-30 NOTE — Progress Notes (Signed)
toco no contractions

## 2011-10-30 NOTE — Progress Notes (Signed)
p-107 lower pelvic pain and bladder pain  UA done

## 2011-10-30 NOTE — Progress Notes (Signed)
Pt has been having abdominal pain for several days.  Pt also having pain over pubic symphysis.  Pt had diarrhea yesterday and so did husband.  Pt having gas the past hour.  Pt has had no contractions bur unsure of what they would feel like.  Pt reports good fetal mvmt.  No dysuria but pelvic pressure is present while on the toilet.  Pt denies leakage of fluid or change in discharge. PT is going to see MD specialists at [facility].  Pt states she spoke to MFM about her MS and they recommended she go to [facility] neuro. Toco=

## 2011-10-30 NOTE — Addendum Note (Signed)
Addended by: Granville Lewis on: 10/30/2011 03:54 PM   Modules accepted: Orders

## 2011-11-02 LAB — CULTURE, URINE COMPREHENSIVE: Colony Count: 45000

## 2011-11-06 ENCOUNTER — Other Ambulatory Visit: Payer: Self-pay | Admitting: Obstetrics & Gynecology

## 2011-11-06 ENCOUNTER — Encounter: Payer: BC Managed Care – PPO | Admitting: Obstetrics & Gynecology

## 2011-11-06 MED ORDER — AMOXICILLIN 500 MG PO CAPS: 500.0000 mg | ORAL_CAPSULE | Freq: Three times a day (TID) | ORAL | Status: DC

## 2011-11-14 ENCOUNTER — Inpatient Hospital Stay (HOSPITAL_COMMUNITY)
Admission: AD | Admit: 2011-11-14 | Discharge: 2011-11-14 | Disposition: A | Discharge status: Home / Self Care | Payer: BC Managed Care – PPO | Source: Ambulatory Visit | Attending: Obstetrics and Gynecology | Admitting: Obstetrics and Gynecology

## 2011-11-14 ENCOUNTER — Encounter (HOSPITAL_COMMUNITY): Payer: Self-pay | Admitting: *Deleted

## 2011-11-14 DIAGNOSIS — G35 Multiple sclerosis: Secondary | ICD-10-CM

## 2011-11-14 DIAGNOSIS — R0602 Shortness of breath: Secondary | ICD-10-CM | POA: Insufficient documentation

## 2011-11-14 DIAGNOSIS — N949 Unspecified condition associated with female genital organs and menstrual cycle: Secondary | ICD-10-CM | POA: Insufficient documentation

## 2011-11-14 DIAGNOSIS — O99891 Other specified diseases and conditions complicating pregnancy: Secondary | ICD-10-CM | POA: Insufficient documentation

## 2011-11-14 DIAGNOSIS — O0990 Supervision of high risk pregnancy, unspecified, unspecified trimester: Secondary | ICD-10-CM

## 2011-11-14 DIAGNOSIS — Z9189 Other specified personal risk factors, not elsewhere classified: Secondary | ICD-10-CM

## 2011-11-14 DIAGNOSIS — O99210 Obesity complicating pregnancy, unspecified trimester: Secondary | ICD-10-CM

## 2011-11-14 DIAGNOSIS — R109 Unspecified abdominal pain: Secondary | ICD-10-CM | POA: Insufficient documentation

## 2011-11-14 DIAGNOSIS — R002 Palpitations: Secondary | ICD-10-CM | POA: Insufficient documentation

## 2011-11-14 HISTORY — DX: Anxiety disorder, unspecified: F41.9

## 2011-11-14 LAB — CBC
MCH: 31.9 pg (ref 26.0–34.0)
MCHC: 35 g/dL (ref 30.0–36.0)
MCV: 91.2 fL (ref 78.0–100.0)
Platelets: 184 10*3/uL (ref 150–400)
RBC: 3.51 MIL/uL — ABNORMAL LOW (ref 3.87–5.11)

## 2011-11-14 LAB — COMPREHENSIVE METABOLIC PANEL
AST: 16 U/L (ref 0–37)
CO2: 23 mEq/L (ref 19–32)
Calcium: 9 mg/dL (ref 8.4–10.5)
Creatinine, Ser: 0.41 mg/dL — ABNORMAL LOW (ref 0.50–1.10)
GFR calc Af Amer: >90 mL/min (ref >90)
GFR calc non Af Amer: >90 mL/min (ref >90)
Sodium: 135 mEq/L (ref 135–145)
Total Protein: 6.4 g/dL (ref 6.0–8.3)

## 2011-11-14 LAB — URINALYSIS, ROUTINE W REFLEX MICROSCOPIC
Bilirubin Urine: NEGATIVE
Hgb urine dipstick: NEGATIVE
Ketones, ur: 15 mg/dL — AB
Nitrite: NEGATIVE
Protein, ur: 30 mg/dL — AB
Urobilinogen, UA: 0.2 mg/dL (ref 0.0–1.0)

## 2011-11-14 LAB — URINE MICROSCOPIC-ADD ON

## 2011-11-14 NOTE — MAU Provider Note (Signed)
History     CSN: [id number]  Arrival date and time: 11/14/11 1514   First Provider Initiated Contact with Patient 11/14/11 1627      Chief Complaint  Patient presents with  . Shortness of Breath  . Palpitations  . Abdominal Pain  . Vaginal Discharge   HPI 31 y.o. G4P0020 at [redacted]w[redacted]d with twins. C/O heart palpitations, shortness of breath, chest heaviness x about 5-10 min, followed by "panic attack" today while standing in line at the grocery store. Happening intermittently for the last few weeks, worst episode today.   Past Medical History  Diagnosis Date  . MS (multiple sclerosis)   . Hyperlipidemia   . GERD (gastroesophageal reflux disease)   . Psoriasis   . Infertility associated with anovulation   . Anxiety     Past Surgical History  Procedure Date  . Dilation and curettage of uterus 8/10    Family History  Problem Relation Age of Onset  . Heart attack Maternal Grandfather   . Cancer Maternal Grandmother     esophagus    History  Substance Use Topics  . Smoking status: Former Smoker -- 1.0 packs/day for 15 years    Types: Cigarettes    Quit date: 07/02/2010  . Smokeless tobacco: Never Used  . Alcohol Use: Yes     occassion - not while pregnant    Allergies: No Known Allergies  Prescriptions prior to admission  Medication Sig Dispense Refill  . amoxicillin (AMOXIL) 500 MG capsule Take 1 capsule (500 mg total) by mouth 3 (three) times daily.  21 capsule  0  . aspirin-acetaminophen-caffeine (EXCEDRIN MIGRAINE) 250-250-65 MG per tablet Take 1 tablet by mouth as needed.      . cyclobenzaprine (FLEXERIL) 10 MG tablet       . diphenhydramine-acetaminophen (TYLENOL PM) 25-500 MG TABS Take 1 tablet by mouth at bedtime as needed.      . ondansetron (ZOFRAN) 4 MG tablet       . Prenatal Vit-Fe Fumarate-FA (MULTIVITAMIN-PRENATAL) 27-0.8 MG TABS Take 1 tablet by mouth daily.      . promethazine (PHENERGAN) 25 MG tablet Take 1 tablet (25 mg total) by mouth every 6  (six) hours as needed for nausea.  30 tablet  2  . SUMAtriptan (IMITREX) 5 MG/ACT nasal spray       . zolpidem (AMBIEN) 10 MG tablet Take 1 tablet (10 mg total) by mouth at bedtime as needed for sleep.  30 tablet  0    Review of Systems  Constitutional: Negative.   Respiratory: Positive for shortness of breath.   Cardiovascular: Positive for palpitations. Negative for chest pain.  Gastrointestinal: Negative for nausea, vomiting, abdominal pain, diarrhea and constipation.  Genitourinary: Negative for dysuria, urgency, frequency, hematuria and flank pain.       Negative for vaginal bleeding, cramping/contractions  Musculoskeletal: Negative.   Neurological: Negative.  Negative for dizziness and loss of consciousness.  Psychiatric/Behavioral: Negative.    Physical Exam   Blood pressure 130/79, pulse 118, temperature 99.1 F (37.3 C), temperature source Oral, resp. rate 18, height 5\' 6"  (1.676 m), weight 117.119 kg (258 lb 3.2 oz), last menstrual period 12/27/2010, SpO2 99.00%.  Physical Exam  Nursing note and vitals reviewed. Constitutional: She is oriented to person, place, and time. She appears well-developed and well-nourished. No distress.  Cardiovascular: Normal rate, regular rhythm and normal heart sounds.   Respiratory: Effort normal and breath sounds normal. No respiratory distress.  GI: Soft. There is no tenderness.  Musculoskeletal:  Normal range of motion.  Neurological: She is alert and oriented to person, place, and time.  Skin: Skin is warm and dry.  Psychiatric: She has a normal mood and affect.    MAU Course  Procedures  Results for orders placed during the hospital encounter of 11/14/11 (from the past 24 hour(s))  URINALYSIS, ROUTINE W REFLEX MICROSCOPIC     Status: Abnormal   Collection Time   11/14/11  3:30 PM      Component Value Range   Color, Urine YELLOW  YELLOW   APPearance CLEAR  CLEAR   Specific Gravity, Urine >1.030 (*) 1.005 - 1.030   pH 6.0  5.0 -  8.0   Glucose, UA NEGATIVE  NEGATIVE mg/dL   Hgb urine dipstick NEGATIVE  NEGATIVE   Bilirubin Urine NEGATIVE  NEGATIVE   Ketones, ur 15 (*) NEGATIVE mg/dL   Protein, ur 30 (*) NEGATIVE mg/dL   Urobilinogen, UA 0.2  0.0 - 1.0 mg/dL   Nitrite NEGATIVE  NEGATIVE   Leukocytes, UA NEGATIVE  NEGATIVE  URINE MICROSCOPIC-ADD ON     Status: Abnormal   Collection Time   11/14/11  3:30 PM      Component Value Range   Squamous Epithelial / LPF FEW (*) RARE   WBC, UA 0-2  <3 WBC/hpf   Urine-Other MUCOUS PRESENT    CBC     Status: Abnormal   Collection Time   11/14/11  4:20 PM      Component Value Range   WBC 9.7  4.0 - 10.5 K/uL   RBC 3.51 (*) 3.87 - 5.11 MIL/uL   Hemoglobin 11.2 (*) 12.0 - 15.0 g/dL   HCT 16.1 (*) 09.6 - 04.5 %   MCV 91.2  78.0 - 100.0 fL   MCH 31.9  26.0 - 34.0 pg   MCHC 35.0  30.0 - 36.0 g/dL   RDW 40.9  81.1 - 91.4 %   Platelets 184  150 - 400 K/uL  COMPREHENSIVE METABOLIC PANEL     Status: Abnormal   Collection Time   11/14/11  4:20 PM      Component Value Range   Sodium 135  135 - 145 mEq/L   Potassium 3.2 (*) 3.5 - 5.1 mEq/L   Chloride 99  96 - 112 mEq/L   CO2 23  19 - 32 mEq/L   Glucose, Bld 115 (*) 70 - 99 mg/dL   BUN 4 (*) 6 - 23 mg/dL   Creatinine, Ser 7.82 (*) 0.50 - 1.10 mg/dL   Calcium 9.0  8.4 - 95.6 mg/dL   Total Protein 6.4  6.0 - 8.3 g/dL   Albumin 2.5 (*) 3.5 - 5.2 g/dL   AST 16  0 - 37 U/L   ALT 10  0 - 35 U/L   Alkaline Phosphatase 60  39 - 117 U/L   Total Bilirubin 0.2 (*) 0.3 - 1.2 mg/dL   GFR calc non Af Amer >90  >90 mL/min   GFR calc Af Amer >90  >90 mL/min   EKG: sinus tachycardia, nonspecific t-wave abnormality.    Assessment and Plan  Care assumed by Cathie Beams, CNM Plan to rev EKG with MD  [facility] 11/14/2011, 4:28 PM

## 2011-11-14 NOTE — MAU Note (Signed)
Patient sitting up, monitors not tracing at this time.

## 2011-11-14 NOTE — MAU Note (Signed)
In grocery store today had heart palpitations with chest tightness lasting under 10 minutes. Did feel like she "would pass out" but did not loose consciousness. She has these episodes on average 1-2 times per day for the last week. No problems prior to pregnancy.

## 2011-11-14 NOTE — MAU Note (Signed)
Patient states for several weeks she has had periods of palpitations that leads to chest tightness and shortness of breath that will pass. Today states she was shopping and this happened and she felt like she was going to pass out. Has been having right lower abdominal pain and a vaginal discharge. No bleeding, feeling movement.

## 2011-11-15 NOTE — MAU Provider Note (Signed)
Attestation of Attending Supervision of Advanced Practitioner (CNM/NP): Evaluation and management procedures were performed by the Advanced Practitioner under my supervision and collaboration.  I have reviewed the Advanced Practitioner's note and chart, and I agree with the management and plan.  Kyleeann Cremeans, MD, FACOG Attending Obstetrician & Gynecologist Faculty Practice, [facility]  

## 2011-11-21 ENCOUNTER — Ambulatory Visit (INDEPENDENT_AMBULATORY_CARE_PROVIDER_SITE_OTHER): Payer: BC Managed Care – PPO | Admitting: Obstetrics & Gynecology

## 2011-11-21 VITALS — BP 137/94 | Temp 97.2°F | Wt 261.0 lb

## 2011-11-21 DIAGNOSIS — R7309 Other abnormal glucose: Secondary | ICD-10-CM

## 2011-11-21 LAB — GLUCOSE, POCT (MANUAL RESULT ENTRY): POC Glucose: 169 mg/dl — AB (ref 70–99)

## 2011-11-21 NOTE — Progress Notes (Signed)
Seeing neurologist on 11/20 for MS; passed 3 hr GTT with one abnormal value.  1 1/2 hour post prandial was 169.  Will retest at 28 weeks for GDM with rpt 3 hour GTT.  Will check cervix before trip to [place].  Pt told of Dr. Augustina Mood in [place] if problems arise.  MFM [place] on Friday.

## 2011-11-21 NOTE — Progress Notes (Signed)
p-116 

## 2011-11-22 ENCOUNTER — Other Ambulatory Visit: Payer: Self-pay | Admitting: Obstetrics & Gynecology

## 2011-11-22 DIAGNOSIS — O30009 Twin pregnancy, unspecified number of placenta and unspecified number of amniotic sacs, unspecified trimester: Secondary | ICD-10-CM

## 2011-11-23 ENCOUNTER — Ambulatory Visit (HOSPITAL_COMMUNITY)
Admission: RE | Admit: 2011-11-23 | Discharge: 2011-11-23 | Disposition: A | Discharge status: Home / Self Care | Payer: BC Managed Care – PPO | Source: Ambulatory Visit | Attending: Obstetrics & Gynecology | Admitting: Obstetrics & Gynecology

## 2011-11-23 VITALS — BP 136/76 | HR 115 | Wt 264.5 lb

## 2011-11-23 DIAGNOSIS — O30009 Twin pregnancy, unspecified number of placenta and unspecified number of amniotic sacs, unspecified trimester: Secondary | ICD-10-CM | POA: Insufficient documentation

## 2011-11-23 DIAGNOSIS — O0990 Supervision of high risk pregnancy, unspecified, unspecified trimester: Secondary | ICD-10-CM

## 2011-11-23 DIAGNOSIS — Z9189 Other specified personal risk factors, not elsewhere classified: Secondary | ICD-10-CM

## 2011-11-23 DIAGNOSIS — O99210 Obesity complicating pregnancy, unspecified trimester: Secondary | ICD-10-CM

## 2011-11-23 DIAGNOSIS — O30049 Twin pregnancy, dichorionic/diamniotic, unspecified trimester: Secondary | ICD-10-CM | POA: Insufficient documentation

## 2011-11-23 DIAGNOSIS — G35 Multiple sclerosis: Secondary | ICD-10-CM

## 2011-11-23 DIAGNOSIS — E669 Obesity, unspecified: Secondary | ICD-10-CM | POA: Insufficient documentation

## 2011-11-23 NOTE — Progress Notes (Signed)
Hanalei Glace  was seen today for an ultrasound appointment.  See full report in AS-OB/GYN.   Impression:  DC/DA twin gestation with best dates of 27 0/7 weeks  A: maternal right, cephalic, anterior placenta     Normal interval anatomy     Fetal growth is appropriate (61st %tile)     Normal amniotic fluid volume  B: maternal left, cephalic, anterior placenta     Normal interval anatomy     Fetal growth is appropriate (82nd %tile)     Normal amniotic fluid volume  Cervical length (transabdominal) 4.2 cm  Recommendations:   Recommend follow up ultrasound in 4 weeks for interval growth.  Alpha Gula, MD

## 2011-12-03 ENCOUNTER — Other Ambulatory Visit: Payer: BC Managed Care – PPO

## 2011-12-04 ENCOUNTER — Ambulatory Visit (INDEPENDENT_AMBULATORY_CARE_PROVIDER_SITE_OTHER): Payer: BC Managed Care – PPO | Admitting: Obstetrics & Gynecology

## 2011-12-04 VITALS — BP 136/88 | Temp 98.0°F | Wt 268.0 lb

## 2011-12-04 DIAGNOSIS — Z3480 Encounter for supervision of other normal pregnancy, unspecified trimester: Secondary | ICD-10-CM

## 2011-12-04 DIAGNOSIS — O0990 Supervision of high risk pregnancy, unspecified, unspecified trimester: Secondary | ICD-10-CM

## 2011-12-04 LAB — GLUCOSE TOLERANCE, 3 HOURS
Glucose Tolerance, 2 hour: 182 mg/dL — ABNORMAL HIGH (ref 70–164)
Glucose, GTT - 3 Hour: 116 mg/dL (ref 70–144)

## 2011-12-04 NOTE — Progress Notes (Signed)
p=110 

## 2011-12-04 NOTE — Progress Notes (Signed)
Passed 3 hr (1 abnml value).  Cervix >4 cm at MFM [place].  No complaints other than problems sleeping.

## 2011-12-17 ENCOUNTER — Ambulatory Visit (INDEPENDENT_AMBULATORY_CARE_PROVIDER_SITE_OTHER): Payer: BC Managed Care – PPO | Admitting: Advanced Practice Midwife

## 2011-12-17 ENCOUNTER — Encounter: Payer: Self-pay | Admitting: *Deleted

## 2011-12-17 VITALS — BP 134/87 | Temp 97.7°F | Wt 275.0 lb

## 2011-12-17 DIAGNOSIS — O1200 Gestational edema, unspecified trimester: Secondary | ICD-10-CM

## 2011-12-17 DIAGNOSIS — IMO0002 Reserved for concepts with insufficient information to code with codable children: Secondary | ICD-10-CM

## 2011-12-17 DIAGNOSIS — Z3480 Encounter for supervision of other normal pregnancy, unspecified trimester: Secondary | ICD-10-CM

## 2011-12-17 DIAGNOSIS — R51 Headache: Secondary | ICD-10-CM

## 2011-12-17 DIAGNOSIS — R519 Headache, unspecified: Secondary | ICD-10-CM

## 2011-12-17 DIAGNOSIS — O26899 Other specified pregnancy related conditions, unspecified trimester: Secondary | ICD-10-CM

## 2011-12-17 DIAGNOSIS — O1490 Unspecified pre-eclampsia, unspecified trimester: Secondary | ICD-10-CM

## 2011-12-17 NOTE — Progress Notes (Signed)
Denies epigastric pain or visual disturbances.  Good fetal movement, no regular cramping/contractions, vaginal bleeding, or LOF. Was out of town for holiday and went to ED over weekend for h/a, swollen hands/feet.  Swelling improved and no h/a today. Normal BP today.  Brought labwork from ED in [facility] and normal platelets and AST/ALT with trace protein on dip.  Will start 24 hour urine today.

## 2011-12-17 NOTE — Progress Notes (Signed)
p-113  Went to ED in [place] over weekend due to increase HA's, elevated BP and swollen extremities.

## 2011-12-20 ENCOUNTER — Other Ambulatory Visit (HOSPITAL_COMMUNITY): Payer: Self-pay | Admitting: Maternal and Fetal Medicine

## 2011-12-20 ENCOUNTER — Encounter: Payer: BC Managed Care – PPO | Admitting: Obstetrics & Gynecology

## 2011-12-20 DIAGNOSIS — O30049 Twin pregnancy, dichorionic/diamniotic, unspecified trimester: Secondary | ICD-10-CM

## 2011-12-20 LAB — CREATININE CLEARANCE, URINE, 24 HOUR
Creatinine, Urine: 89.9 mg/dL
Creatinine: 0.4 mg/dL — ABNORMAL LOW (ref 0.50–1.10)

## 2011-12-20 LAB — PROTEIN, URINE, 24 HOUR: Protein, Urine: 34 mg/dL

## 2011-12-20 NOTE — Addendum Note (Signed)
Addended by: Granville Lewis on: 12/20/2011 04:49 PM   Modules accepted: Orders

## 2011-12-21 ENCOUNTER — Ambulatory Visit (HOSPITAL_COMMUNITY)
Admission: RE | Admit: 2011-12-21 | Discharge: 2011-12-21 | Disposition: A | Discharge status: Home / Self Care | Payer: BC Managed Care – PPO | Source: Ambulatory Visit | Attending: Obstetrics & Gynecology | Admitting: Obstetrics & Gynecology

## 2011-12-21 ENCOUNTER — Other Ambulatory Visit (HOSPITAL_COMMUNITY): Payer: Self-pay | Admitting: Maternal and Fetal Medicine

## 2011-12-21 VITALS — BP 136/92 | HR 104 | Wt 277.5 lb

## 2011-12-21 DIAGNOSIS — O99210 Obesity complicating pregnancy, unspecified trimester: Secondary | ICD-10-CM

## 2011-12-21 DIAGNOSIS — O30009 Twin pregnancy, unspecified number of placenta and unspecified number of amniotic sacs, unspecified trimester: Secondary | ICD-10-CM | POA: Insufficient documentation

## 2011-12-21 DIAGNOSIS — Z9189 Other specified personal risk factors, not elsewhere classified: Secondary | ICD-10-CM

## 2011-12-21 DIAGNOSIS — O30049 Twin pregnancy, dichorionic/diamniotic, unspecified trimester: Secondary | ICD-10-CM

## 2011-12-21 DIAGNOSIS — IMO0002 Reserved for concepts with insufficient information to code with codable children: Secondary | ICD-10-CM | POA: Insufficient documentation

## 2011-12-21 DIAGNOSIS — O0990 Supervision of high risk pregnancy, unspecified, unspecified trimester: Secondary | ICD-10-CM

## 2011-12-21 DIAGNOSIS — G35 Multiple sclerosis: Secondary | ICD-10-CM

## 2011-12-21 DIAGNOSIS — E669 Obesity, unspecified: Secondary | ICD-10-CM | POA: Insufficient documentation

## 2011-12-21 NOTE — Progress Notes (Signed)
Charlene [name] was seen for ultrasound appointment today.  Please see AS-OBGYN report for details.

## 2011-12-24 ENCOUNTER — Telehealth: Payer: Self-pay | Admitting: *Deleted

## 2011-12-24 DIAGNOSIS — O0990 Supervision of high risk pregnancy, unspecified, unspecified trimester: Secondary | ICD-10-CM

## 2011-12-24 NOTE — Telephone Encounter (Signed)
Pt scheduled for MFM Dr Consult to discuss pregnancy and to have NST tomorrow.

## 2011-12-25 ENCOUNTER — Ambulatory Visit (INDEPENDENT_AMBULATORY_CARE_PROVIDER_SITE_OTHER): Payer: BC Managed Care – PPO | Admitting: Obstetrics & Gynecology

## 2011-12-25 ENCOUNTER — Encounter (HOSPITAL_COMMUNITY): Payer: Self-pay

## 2011-12-25 ENCOUNTER — Ambulatory Visit (HOSPITAL_COMMUNITY)
Admission: RE | Admit: 2011-12-25 | Discharge: 2011-12-25 | Disposition: A | Discharge status: Home / Self Care | Payer: BC Managed Care – PPO | Source: Ambulatory Visit | Attending: Obstetrics & Gynecology | Admitting: Obstetrics & Gynecology

## 2011-12-25 VITALS — BP 139/91 | HR 106 | Wt 279.2 lb

## 2011-12-25 DIAGNOSIS — G35 Multiple sclerosis: Secondary | ICD-10-CM

## 2011-12-25 DIAGNOSIS — G35D Multiple sclerosis, unspecified: Secondary | ICD-10-CM

## 2011-12-25 DIAGNOSIS — IMO0002 Reserved for concepts with insufficient information to code with codable children: Secondary | ICD-10-CM | POA: Insufficient documentation

## 2011-12-25 DIAGNOSIS — O30009 Twin pregnancy, unspecified number of placenta and unspecified number of amniotic sacs, unspecified trimester: Secondary | ICD-10-CM | POA: Insufficient documentation

## 2011-12-25 DIAGNOSIS — O0990 Supervision of high risk pregnancy, unspecified, unspecified trimester: Secondary | ICD-10-CM

## 2011-12-25 DIAGNOSIS — Z9189 Other specified personal risk factors, not elsewhere classified: Secondary | ICD-10-CM

## 2011-12-25 DIAGNOSIS — O99210 Obesity complicating pregnancy, unspecified trimester: Secondary | ICD-10-CM

## 2011-12-25 NOTE — Progress Notes (Signed)
I saw and evaluated Charlene [name] along with Dr. Alinda Dooms.  I agree with her documentation and plan.  [name], MD

## 2011-12-25 NOTE — Progress Notes (Signed)
Maternal Fetal Medicine Consultation Reason for consultation: 31 yo P0 at 40 4/7 weeks, with Di-Di twin IUP, with Pre eclampsia HPI: As above OB History: see chart for full details PMH: Obesity, Multiple sclerosis, migraine headaches PSH: D&C 2010 Meds: see medication list for full details Allergies:  NKDA FH: + heart disease, denies congenital anomalies. Has a cousin with Down syndrome Soc: denies smoking, ethanol, drug use. Review of Systems: no vaginal bleeding or cramping/contractions, no LOF, no nausea/vomiting, + fetal movement, but not to her satisfaction. Reporting headaches which re appeared over the last 2 weeks, that have been absent for the majority of the pregnancy. All other systems reviewed and are negative.  PNL: see chart for full details PE:  VS: BP      139/91          pulse    106            weight 279.25lbs GEN: well-appearing female Please see separate document for fetal ultrasound report from 12/21/11  A/P: 31 yo P0 at 31 4/7 weeks, with Di-Di twin IUP, with Pre eclampsia I discussed the ultrasound with the patient and counseled her as follows: Pre eclampsia: I discussed that women with pre eclampsia, are at increased risk of adverse pregnancy outcome, including, but not limited to abruption, fetal growth restriction, and perinatal death. The risk increases with severity of hypertension, and presence of end-organ damage. I also discussed that risk of preterm delivery is increased and is usually iatrogenic from the complications listed above. We reviewed the maternal end organ symptoms that could manifest today in detail; and she verbalized understanding of all including but not limited to stroke, MI, CVA, renal failure, seizure, etc.  She is aware that she must keep a fetal movement log as well as a maternal symptom log and present to the hospital immediately for evaluation with any abnormalities or with any pre eclamptic prodromal symptoms.   We recommend: - Obtaining  baseline studies: weekly CBC, AST, ALT, BUN, creatinine, and uric acid. - Administering antenatal corticosteroids  - Serial ultrasounds for fetal growth every 3 weeks - Antenatal testing, either 2x weekly NST with weekly AFI, or 2x weekly BPP. - Delivery at 37 weeks if Pre eclampsia does not progress. If maternal status worsen and or fetal status is not reassuring, we recommend immediate delivery, despite the gestational age. - Mode of delivery was discussed with patient but explained this would depend on many factors including preferences and comfort level of her delivering physician and would depend on presentation, fetal weights, and fetal/maternal situation at that time. Vaginal delivery would be optimal, but the clinical situation must be fitting.  Glucose intolerance -GTT results were reviewed, and they did not meet criteria for GDM.   Fetal and neonatal issues related to diabetes include but are not limited to: growth abnormalities neonatal hypoglycemia, hyperbilirubinemia, thrombocytopenia as well as macrosomia, polyhydramnios and increased risk of fetal demise.  If any concern still exists, we recommend obtaining finger stick glucose recordings.  Goals would be to maintain glycemic control during pregnancy, with fasting blood sugars less than 90 mg/dl and 2-hour postprandial less than 120 mg/dl with avoidance of hypoglycemia.  Glucose levels can remain elevated for up to 2 weeks after betamethasone administration, however, we still recommend checking levels, and treating if necessary.    Case was reviewed with Dr Otho Perl.  Thank you for the opportunity to be a part of the care of Charlene [name].  We will continue to follow her  with ultrasounds in our [facility]. Please contact our office if we can be of further assistance.  I spent approximately 30 minutes with this patient with over 50% of time spent in face-to-face counseling.

## 2011-12-26 LAB — CREATININE, SERUM: Creat: 0.32 mg/dL — ABNORMAL LOW (ref 0.50–1.10)

## 2011-12-26 LAB — CBC
HCT: 33.1 % — ABNORMAL LOW (ref 36.0–46.0)
MCH: 30.6 pg (ref 26.0–34.0)
MCV: 90.4 fL (ref 78.0–100.0)
Platelets: 193 10*3/uL (ref 150–400)
RDW: 14.4 % (ref 11.5–15.5)
WBC: 10.2 10*3/uL (ref 4.0–10.5)

## 2011-12-26 LAB — BUN: BUN: 5 mg/dL — ABNORMAL LOW (ref 6–23)

## 2011-12-27 ENCOUNTER — Inpatient Hospital Stay (HOSPITAL_COMMUNITY)
Admission: AD | Admit: 2011-12-27 | Discharge: 2011-12-31 | DRG: 652 | Disposition: A | Discharge status: Home / Self Care | Payer: BC Managed Care – PPO | Source: Ambulatory Visit | Attending: Obstetrics and Gynecology | Admitting: Obstetrics and Gynecology

## 2011-12-27 ENCOUNTER — Inpatient Hospital Stay (HOSPITAL_COMMUNITY): Payer: BC Managed Care – PPO

## 2011-12-27 ENCOUNTER — Encounter (HOSPITAL_COMMUNITY): Payer: Self-pay | Admitting: *Deleted

## 2011-12-27 DIAGNOSIS — O42919 Preterm premature rupture of membranes, unspecified as to length of time between rupture and onset of labor, unspecified trimester: Secondary | ICD-10-CM

## 2011-12-27 DIAGNOSIS — O30049 Twin pregnancy, dichorionic/diamniotic, unspecified trimester: Secondary | ICD-10-CM

## 2011-12-27 DIAGNOSIS — IMO0002 Reserved for concepts with insufficient information to code with codable children: Secondary | ICD-10-CM | POA: Diagnosis present

## 2011-12-27 DIAGNOSIS — O30009 Twin pregnancy, unspecified number of placenta and unspecified number of amniotic sacs, unspecified trimester: Secondary | ICD-10-CM | POA: Diagnosis present

## 2011-12-27 DIAGNOSIS — O4290 Premature rupture of membranes, unspecified as to length of time between rupture and onset of labor, unspecified weeks of gestation: Principal | ICD-10-CM | POA: Diagnosis present

## 2011-12-27 HISTORY — DX: Gestational (pregnancy-induced) hypertension without significant proteinuria, unspecified trimester: O13.9

## 2011-12-27 HISTORY — DX: Migraine, unspecified, not intractable, without status migrainosus: G43.909

## 2011-12-27 LAB — COMPREHENSIVE METABOLIC PANEL
AST: 21 U/L (ref 0–37)
Albumin: 2.3 g/dL — ABNORMAL LOW (ref 3.5–5.2)
BUN: 7 mg/dL (ref 6–23)
CO2: 26 mEq/L (ref 19–32)
Calcium: 9.8 mg/dL (ref 8.4–10.5)
Creatinine, Ser: 0.6 mg/dL (ref 0.50–1.10)
GFR calc non Af Amer: >90 mL/min (ref >90)

## 2011-12-27 LAB — AMNISURE RUPTURE OF MEMBRANE (ROM) NOT AT ARMC: Amnisure ROM: POSITIVE

## 2011-12-27 LAB — CBC
Hemoglobin: 11.2 g/dL — ABNORMAL LOW (ref 12.0–15.0)
MCH: 31.5 pg (ref 26.0–34.0)
Platelets: 162 10*3/uL (ref 150–400)
RBC: 3.56 MIL/uL — ABNORMAL LOW (ref 3.87–5.11)
WBC: 10.7 10*3/uL — ABNORMAL HIGH (ref 4.0–10.5)

## 2011-12-27 LAB — POCT CBG (FASTING - GLUCOSE)-MANUAL ENTRY

## 2011-12-27 LAB — PROTEIN / CREATININE RATIO, URINE
Creatinine, Urine: 180.98 mg/dL
Protein Creatinine Ratio: 1.08 — ABNORMAL HIGH (ref 0.00–0.15)

## 2011-12-27 LAB — ABO/RH: ABO/RH(D): A POS

## 2011-12-27 MED ORDER — ACETAMINOPHEN 325 MG PO TABS
650.0000 mg | ORAL_TABLET | ORAL | Status: DC | PRN
  Administered 2011-12-27: 650 mg via ORAL
  Filled 2011-12-27: qty 2

## 2011-12-27 MED ORDER — MAGNESIUM SULFATE 40 G IN LACTATED RINGERS - SIMPLE
2.0000 g/h | INTRAVENOUS | Status: AC
  Administered 2011-12-27: 2 g/h via INTRAVENOUS
  Filled 2011-12-27: qty 500

## 2011-12-27 MED ORDER — CYCLOBENZAPRINE HCL 10 MG PO TABS
10.0000 mg | ORAL_TABLET | Freq: Every evening | ORAL | Status: DC | PRN
  Administered 2011-12-27: 10 mg via ORAL
  Filled 2011-12-27: qty 1

## 2011-12-27 MED ORDER — ERYTHROMYCIN BASE 250 MG PO TABS
250.0000 mg | ORAL_TABLET | Freq: Four times a day (QID) | ORAL | Status: DC
  Filled 2011-12-27 (×2): qty 1

## 2011-12-27 MED ORDER — CALCIUM CARBONATE ANTACID 500 MG PO CHEW
2.0000 | CHEWABLE_TABLET | ORAL | Status: DC | PRN
  Administered 2011-12-27 (×2): 400 mg via ORAL
  Filled 2011-12-27 (×2): qty 2

## 2011-12-27 MED ORDER — DOCUSATE SODIUM 100 MG PO CAPS
100.0000 mg | ORAL_CAPSULE | Freq: Every day | ORAL | Status: DC
  Administered 2011-12-27: 100 mg via ORAL
  Filled 2011-12-27: qty 1

## 2011-12-27 MED ORDER — ZOLPIDEM TARTRATE 5 MG PO TABS: 5.0000 mg | ORAL_TABLET | Freq: Every evening | ORAL | Status: DC | PRN

## 2011-12-27 MED ORDER — LIDOCAINE HCL 2 % EX GEL
Freq: Once | CUTANEOUS | Status: AC
  Administered 2011-12-27: 5 via URETHRAL
  Filled 2011-12-27: qty 5

## 2011-12-27 MED ORDER — AMOXICILLIN 500 MG PO CAPS
500.0000 mg | ORAL_CAPSULE | Freq: Three times a day (TID) | ORAL | Status: DC
  Filled 2011-12-27 (×2): qty 1

## 2011-12-27 MED ORDER — SODIUM CHLORIDE 0.9 % IV SOLN
2.0000 g | Freq: Four times a day (QID) | INTRAVENOUS | Status: AC
  Administered 2011-12-27 – 2011-12-28 (×8): 2 g via INTRAVENOUS
  Filled 2011-12-27 (×8): qty 2000

## 2011-12-27 MED ORDER — PRENATAL MULTIVITAMIN CH
1.0000 | ORAL_TABLET | Freq: Every day | ORAL | Status: DC
  Administered 2011-12-27 (×2): 1 via ORAL
  Filled 2011-12-27 (×2): qty 1

## 2011-12-27 MED ORDER — SODIUM CHLORIDE 0.9 % IV SOLN
250.0000 mg | Freq: Four times a day (QID) | INTRAVENOUS | Status: AC
  Administered 2011-12-27 – 2011-12-28 (×8): 250 mg via INTRAVENOUS
  Filled 2011-12-27 (×8): qty 250

## 2011-12-27 MED ORDER — SODIUM CHLORIDE 0.9 % IV SOLN
2.0000 g | Freq: Once | INTRAVENOUS | Status: DC
  Filled 2011-12-27: qty 2000

## 2011-12-27 MED ORDER — DIPHENHYDRAMINE HCL 25 MG PO CAPS
25.0000 mg | ORAL_CAPSULE | Freq: Every evening | ORAL | Status: DC | PRN
  Administered 2011-12-27: 25 mg via ORAL
  Filled 2011-12-27: qty 1

## 2011-12-27 MED ORDER — MAGNESIUM SULFATE BOLUS VIA INFUSION
4.0000 g | Freq: Once | INTRAVENOUS | Status: AC
  Administered 2011-12-27: 4 g via INTRAVENOUS
  Filled 2011-12-27: qty 500

## 2011-12-27 MED ORDER — BETAMETHASONE SOD PHOS & ACET 6 (3-3) MG/ML IJ SUSP
12.0000 mg | INTRAMUSCULAR | Status: AC
  Administered 2011-12-27 – 2011-12-28 (×2): 12 mg via INTRAMUSCULAR
  Filled 2011-12-27 (×2): qty 2

## 2011-12-27 MED ORDER — LACTATED RINGERS IV SOLN
INTRAVENOUS | Status: DC
  Administered 2011-12-27 – 2011-12-28 (×2): via INTRAVENOUS

## 2011-12-27 NOTE — H&P (Addendum)
31 yr female G3 P24 now pregnant with Di/Di twins conceived via IVF, admitted with PPROM   She had a prior diagnosis of preeclampsia , with consultation with Dr Alinda Dooms of MFM done 12/10.   Goal was conservative management without delivery unless condition progressed, now with confirmed PPROM of baby A.  Please refer to MFM note by Dr Alinda Dooms as of 12/25/11.  CMP     Component Value Date/Time   NA 139 12/27/2011 0210   K 3.2* 12/27/2011 0210   CL 104 12/27/2011 0210   CO2 26 12/27/2011 0210   GLUCOSE 97 12/27/2011 0210   BUN 7 12/27/2011 0210   CREATININE 0.60 12/27/2011 0210   CREATININE 0.32* 12/25/2011 1545   CREATININE 0.40* 12/19/2011 1132   CALCIUM 9.8 12/27/2011 0210   PROT 5.8* 12/27/2011 0210   ALBUMIN 2.3* 12/27/2011 0210   AST 21 12/27/2011 0210   ALT 13 12/27/2011 0210   ALKPHOS 87 12/27/2011 0210   BILITOT 0.2* 12/27/2011 0210   GFRNONAA >90 12/27/2011 0210   GFRAA >90 12/27/2011 0210    Cervix: The cervix appears closed. Cervical length is measured at  1.8 cm on translabial imaging.  Uterus/Adnexae: Uterus is incompletely visualized. Right ovary is  visualized and measures right ovary is visualized and contains a  cyst measuring 3.2 x 2.5 x 2.8 cm. Left ovary is not identified.  IMPRESSION:  Twin pregnancy. Amniotic fluid volume low normal for Twin A and  normal for Twin B. Fetal movement and cardiac activity were  identified on both twins. Biophysical profile 8/8 for both twins.   Attestation of Attending Supervision of Advanced Practitioner: Evaluation and management procedures were performed by the PA/NP/CNM/OB Fellow under my supervision/collaboration. Chart reviewed and agree with management and plan. Plan:  24 hour total protein restarted.           Daily cmp and cbc to monitor pih labs           Mag sulfate x 24 hours           Discuss with MFM regarding del at 34 wks or further.  Blannie Shedlock V 12/27/2011 5:29 AM

## 2011-12-27 NOTE — H&P (Signed)
Charlene [name] is a 31 y.o. female presenting for ROM. Maternal Medical History:  Reason for admission: Reason for admission: rupture of membranes.  Reason for Admission:   nauseaContractions: Onset was 1-2 hours ago.    Fetal activity: Perceived fetal activity is normal.   Last perceived fetal movement was within the past hour.    Prenatal complications: Pre-eclampsia.   Prenatal Complications - Diabetes: none.    OB History    Grav Para Term Preterm Abortions TAB SAB Ect Mult Living   4 0   3 1 2    0     Past Medical History  Diagnosis Date  . MS (multiple sclerosis)   . Hyperlipidemia   . GERD (gastroesophageal reflux disease)   . Psoriasis   . Infertility associated with anovulation   . Anxiety   . Pregnancy induced hypertension    Past Surgical History  Procedure Date  . Dilation and curettage of uterus 8/10   Family History: family history includes Cancer in her maternal grandmother and Heart attack in her maternal grandfather. Social History:  reports that she quit smoking about 17 months ago. Her smoking use included Cigarettes. She has a 15 pack-year smoking history. She has never used smokeless tobacco. She reports that she drinks alcohol. She reports that she does not use illicit drugs.   Prenatal Transfer Tool  Maternal Diabetes: No Genetic Screening: Normal Maternal Ultrasounds/Referrals: Normal Di/Di Twins Fetal Ultrasounds or other Referrals:  Referred to Materal Fetal Medicine  Maternal Substance Abuse:  No Significant Maternal Medications:  None Significant Maternal Lab Results:  None Other Comments:  None  Review of Systems  Constitutional: Negative for fever and chills.  Eyes: Negative for blurred vision and double vision.  Gastrointestinal: Negative for nausea, vomiting, abdominal pain, diarrhea and constipation.  Genitourinary: Negative for dysuria, urgency and frequency.  Neurological: Positive for headaches. Negative for dizziness.       Blood pressure 142/92, pulse 95, temperature 98.6 F (37 C), temperature source Oral, resp. rate 20, height 5' 6.6" (1.692 m), weight 126.735 kg (279 lb 6.4 oz), last menstrual period 12/27/2010. Maternal Exam:  Uterine Assessment: No UC's  Abdomen: Patient reports no abdominal tenderness. Introitus: Normal vulva. Ferning test: positive.  Amniotic fluid character: clear.  Cervix: not evaluated.   Fetal Exam Fetal Monitor Review: Mode: ultrasound.   Baseline rate: A: 135, B: 140.  Variability: moderate (6-25 bpm).   Pattern: accelerations present and no decelerations.    Fetal State Assessment: Category I - tracings are normal.     Physical Exam  Nursing note and vitals reviewed. Constitutional: She is oriented to person, place, and time. She appears well-developed and well-nourished. No distress.  Cardiovascular: Normal rate.   Respiratory: Effort normal.  GI: Soft.  Neurological: She is alert and oriented to person, place, and time. She has normal reflexes.  Skin: Skin is warm and dry.  Psychiatric: She has a normal mood and affect.    Prenatal labs: ABO, Rh: A/POS/-- (06/28 1150) Antibody: NEG (06/28 1150) Rubella: 354.2 (06/28 1150) RPR: NON REAC (06/28 1150)  HBsAg: NEGATIVE (06/28 1150)  HIV:   Pending GBS:   Pending  Assessment/Plan: PPROM Mild-Pre-X Admit to antenatal Routine antenatal PPROM labs Repeat Pre-X labs  Dr. Emelda Fear aware of patient and patient status  Tawnya Crook 12/27/2011, 1:48 AM

## 2011-12-27 NOTE — MAU Note (Signed)
Lost mucus plug on Tuesday. Started having shooting pains last night. Had a gush of fluid that soaked two areas on the patient shorts around 11pm last night. Has been having a little trickle here and there since.

## 2011-12-27 NOTE — Progress Notes (Signed)
UR completed 

## 2011-12-27 NOTE — MAU Provider Note (Signed)
History     CSN: [id number]  Arrival date and time: 12/27/11 0028   First Provider Initiated Contact with Patient 12/27/11 0116      Chief Complaint  Patient presents with  . Rupture of Membranes   HPI  Charlene [name] is 31 y.o. G4P0030 at [redacted]w[redacted]d who presents today with ROM. She has also had a new headache for the last 2 days. She is being followed for Pre-x. She thinks that her water broke around 2300. She lost her mucous plug a few days ago, and then tonight she felt a pop and a gush and she has been leaking since.   Past Medical History  Diagnosis Date  . MS (multiple sclerosis)   . Hyperlipidemia   . GERD (gastroesophageal reflux disease)   . Psoriasis   . Infertility associated with anovulation   . Anxiety   . Pregnancy induced hypertension     Past Surgical History  Procedure Date  . Dilation and curettage of uterus 8/10    Family History  Problem Relation Age of Onset  . Heart attack Maternal Grandfather   . Cancer Maternal Grandmother     esophagus    History  Substance Use Topics  . Smoking status: Former Smoker -- 1.0 packs/day for 15 years    Types: Cigarettes    Quit date: 07/02/2010  . Smokeless tobacco: Never Used  . Alcohol Use: Yes     Comment: occassion - not while pregnant    Allergies: No Known Allergies  Prescriptions prior to admission  Medication Sig Dispense Refill  . amoxicillin (AMOXIL) 500 MG capsule Take 1 capsule (500 mg total) by mouth 3 (three) times daily.  21 capsule  0  . aspirin-acetaminophen-caffeine (EXCEDRIN MIGRAINE) 250-250-65 MG per tablet Take 1 tablet by mouth as needed.      . calcium carbonate (TUMS - DOSED IN MG ELEMENTAL CALCIUM) 500 MG chewable tablet Chew 1-2 tablets by mouth daily.      . cyclobenzaprine (FLEXERIL) 10 MG tablet       . diphenhydramine-acetaminophen (TYLENOL PM) 25-500 MG TABS Take 1 tablet by mouth at bedtime as needed.      . Prenatal Vit-Fe Fumarate-FA (MULTIVITAMIN-PRENATAL) 27-0.8 MG TABS  Take 1 tablet by mouth daily.      . ranitidine (ZANTAC) 150 MG tablet Take 150 mg by mouth 2 (two) times daily.      . ondansetron (ZOFRAN) 4 MG tablet       . promethazine (PHENERGAN) 25 MG tablet Take 1 tablet (25 mg total) by mouth every 6 (six) hours as needed for nausea.  30 tablet  2  . SUMAtriptan (IMITREX) 5 MG/ACT nasal spray       . zolpidem (AMBIEN) 10 MG tablet Take 1 tablet (10 mg total) by mouth at bedtime as needed for sleep.  30 tablet  0    Review of Systems  Constitutional: Negative for fever and chills.  Eyes: Negative for blurred vision.  Gastrointestinal: Negative for nausea, vomiting, abdominal pain, diarrhea and constipation.  Genitourinary: Negative for dysuria, urgency and frequency.  Neurological: Positive for headaches. Negative for dizziness.   Physical Exam   Blood pressure 142/92, pulse 95, temperature 98.6 F (37 C), temperature source Oral, resp. rate 20, height 5' 6.6" (1.692 m), weight 126.735 kg (279 lb 6.4 oz), last menstrual period 12/27/2010.  Physical Exam  Nursing note and vitals reviewed. Constitutional: She is oriented to person, place, and time. She appears well-developed and well-nourished. No distress.  Respiratory:  Effort normal.  GI: Soft. There is no tenderness.  Neurological: She is oriented to person, place, and time.  Skin: Skin is warm and dry.  Psychiatric: She has a normal mood and affect.    MAU Course  Procedures  Results for orders placed during the hospital encounter of 12/27/11 (from the past 24 hour(s))  AMNISURE RUPTURE OF MEMBRANE (ROM)     Status: Normal   Collection Time   12/27/11  1:09 AM      Component Value Range   Amnisure ROM POSITIVE      Assessment and Plan  PPROM Mild Pre-X Admit to antenatal Routine ante orders Repeat Pre-X orders.  Dr. Emelda Fear aware of patient.    Tawnya Crook 12/27/2011, 1:17 AM

## 2011-12-28 ENCOUNTER — Ambulatory Visit (HOSPITAL_COMMUNITY): Payer: BC Managed Care – PPO

## 2011-12-28 ENCOUNTER — Encounter (HOSPITAL_COMMUNITY): Payer: Self-pay | Admitting: *Deleted

## 2011-12-28 ENCOUNTER — Inpatient Hospital Stay (HOSPITAL_COMMUNITY): Payer: BC Managed Care – PPO | Admitting: Anesthesiology

## 2011-12-28 ENCOUNTER — Encounter (HOSPITAL_COMMUNITY): Payer: Self-pay | Admitting: Anesthesiology

## 2011-12-28 ENCOUNTER — Inpatient Hospital Stay (HOSPITAL_COMMUNITY): Payer: BC Managed Care – PPO

## 2011-12-28 DIAGNOSIS — O4290 Premature rupture of membranes, unspecified as to length of time between rupture and onset of labor, unspecified weeks of gestation: Secondary | ICD-10-CM

## 2011-12-28 DIAGNOSIS — O30009 Twin pregnancy, unspecified number of placenta and unspecified number of amniotic sacs, unspecified trimester: Secondary | ICD-10-CM

## 2011-12-28 DIAGNOSIS — IMO0002 Reserved for concepts with insufficient information to code with codable children: Secondary | ICD-10-CM

## 2011-12-28 LAB — COMPREHENSIVE METABOLIC PANEL
ALT: 11 U/L (ref 0–35)
Albumin: 2.2 g/dL — ABNORMAL LOW (ref 3.5–5.2)
Alkaline Phosphatase: 79 U/L (ref 39–117)
Chloride: 105 mEq/L (ref 96–112)
GFR calc Af Amer: >90 mL/min (ref >90)
Glucose, Bld: 126 mg/dL — ABNORMAL HIGH (ref 70–99)
Potassium: 3.1 mEq/L — ABNORMAL LOW (ref 3.5–5.1)
Sodium: 139 mEq/L (ref 135–145)
Total Bilirubin: 0.2 mg/dL — ABNORMAL LOW (ref 0.3–1.2)
Total Protein: 5.4 g/dL — ABNORMAL LOW (ref 6.0–8.3)

## 2011-12-28 LAB — CBC
HCT: 29.3 % — ABNORMAL LOW (ref 36.0–46.0)
HCT: 30.9 % — ABNORMAL LOW (ref 36.0–46.0)
Hemoglobin: 9.9 g/dL — ABNORMAL LOW (ref 12.0–15.0)
Hemoglobin: 10.4 g/dL — ABNORMAL LOW (ref 12.0–15.0)
MCH: 31 pg (ref 26.0–34.0)
MCHC: 33.8 g/dL (ref 30.0–36.0)
MCHC: 33.7 g/dL (ref 30.0–36.0)
RBC: 3.35 MIL/uL — ABNORMAL LOW (ref 3.87–5.11)
WBC: 11.4 10*3/uL — ABNORMAL HIGH (ref 4.0–10.5)

## 2011-12-28 LAB — CREATININE CLEARANCE, URINE, 24 HOUR
Collection Interval-CRCL: 24 hours
Creatinine Clearance: 229 mL/min — ABNORMAL HIGH (ref 75–115)
Creatinine, 24H Ur: 1976 mg/d — ABNORMAL HIGH (ref 700–1800)

## 2011-12-28 LAB — PROTEIN, URINE, 24 HOUR
Collection Interval-UPROT: 24 hours
Protein, Urine: 71 mg/dL
Urine Total Volume-UPROT: 3000 mL

## 2011-12-28 LAB — HIV ANTIBODY (ROUTINE TESTING W REFLEX): HIV: NONREACTIVE

## 2011-12-28 MED ORDER — OXYTOCIN 40 UNITS IN LACTATED RINGERS INFUSION - SIMPLE MED
INTRAVENOUS | Status: AC
  Administered 2011-12-28: 500 mL/h
  Filled 2011-12-28: qty 1000

## 2011-12-28 MED ORDER — LIDOCAINE HCL (PF) 1 % IJ SOLN
INTRAMUSCULAR | Status: AC
  Filled 2011-12-28: qty 30

## 2011-12-28 MED ORDER — EPHEDRINE 5 MG/ML INJ
10.0000 mg | INTRAVENOUS | Status: DC | PRN
  Filled 2011-12-28: qty 4

## 2011-12-28 MED ORDER — MAGNESIUM SULFATE BOLUS VIA INFUSION
4.0000 g | Freq: Once | INTRAVENOUS | Status: AC
  Administered 2011-12-28: 4 g via INTRAVENOUS
  Filled 2011-12-28: qty 500

## 2011-12-28 MED ORDER — DIPHENHYDRAMINE HCL 50 MG/ML IJ SOLN: 12.5000 mg | INTRAMUSCULAR | Status: DC | PRN

## 2011-12-28 MED ORDER — PHENYLEPHRINE 40 MCG/ML (10ML) SYRINGE FOR IV PUSH (FOR BLOOD PRESSURE SUPPORT)
80.0000 ug | PREFILLED_SYRINGE | INTRAVENOUS | Status: DC | PRN
  Filled 2011-12-28: qty 5

## 2011-12-28 MED ORDER — MAGNESIUM SULFATE 40 G IN LACTATED RINGERS - SIMPLE
2.0000 g/h | INTRAVENOUS | Status: DC
  Administered 2011-12-28: 2 g/h via INTRAVENOUS
  Filled 2011-12-28: qty 500

## 2011-12-28 MED ORDER — TERBUTALINE SULFATE 1 MG/ML IJ SOLN: 0.2500 mg | Freq: Once | INTRAMUSCULAR | Status: AC | PRN

## 2011-12-28 MED ORDER — FENTANYL 2.5 MCG/ML BUPIVACAINE 1/10 % EPIDURAL INFUSION (WH - ANES)
14.0000 mL/h | INTRAMUSCULAR | Status: DC
  Administered 2011-12-28: 14 mL/h via EPIDURAL
  Filled 2011-12-28 (×2): qty 125

## 2011-12-28 MED ORDER — OXYTOCIN 40 UNITS IN LACTATED RINGERS INFUSION - SIMPLE MED
1.0000 m[IU]/min | INTRAVENOUS | Status: DC
  Administered 2011-12-28: 2 m[IU]/min via INTRAVENOUS

## 2011-12-28 MED ORDER — EPHEDRINE 5 MG/ML INJ: 10.0000 mg | INTRAVENOUS | Status: DC | PRN

## 2011-12-28 MED ORDER — LACTATED RINGERS IV SOLN
500.0000 mL | Freq: Once | INTRAVENOUS | Status: AC
  Administered 2011-12-28: 500 mL via INTRAVENOUS

## 2011-12-28 MED ORDER — PHENYLEPHRINE 40 MCG/ML (10ML) SYRINGE FOR IV PUSH (FOR BLOOD PRESSURE SUPPORT): 80.0000 ug | PREFILLED_SYRINGE | INTRAVENOUS | Status: DC | PRN

## 2011-12-28 MED ORDER — BUTALBITAL-APAP-CAFFEINE 50-325-40 MG PO TABS
2.0000 | ORAL_TABLET | Freq: Four times a day (QID) | ORAL | Status: DC | PRN
  Administered 2011-12-28: 2 via ORAL
  Filled 2011-12-28: qty 2

## 2011-12-28 MED ORDER — FENTANYL CITRATE 0.05 MG/ML IJ SOLN
INTRAMUSCULAR | Status: AC
  Administered 2011-12-28: 100 ug
  Filled 2011-12-28: qty 2

## 2011-12-28 MED ORDER — CITRIC ACID-SODIUM CITRATE 334-500 MG/5ML PO SOLN
ORAL | Status: AC
  Filled 2011-12-28: qty 15

## 2011-12-28 MED ORDER — SODIUM BICARBONATE 8.4 % IV SOLN
INTRAVENOUS | Status: DC | PRN
  Administered 2011-12-28: 5 mL via EPIDURAL

## 2011-12-28 MED ORDER — MISOPROSTOL 200 MCG PO TABS
ORAL_TABLET | ORAL | Status: AC
  Administered 2011-12-28: 400 ug
  Filled 2011-12-28: qty 2

## 2011-12-28 NOTE — Progress Notes (Signed)
Charlene [name] is a 31 y.o. G4P0030 at [redacted]w[redacted]d admitted for induction of labor due to Pre-eclamptic toxemia of pregnancy..  Subjective: Pt comfortable with epidural, denies complaints.   Objective: BP 162/70  Pulse 100  Temp 97.8 F (36.6 C) (Oral)  Resp 20  Ht 5' 6.5" (1.689 m)  Wt 279 lb 6 oz (126.724 kg)  BMI 44.42 kg/m2  SpO2 96%  LMP 12/27/2010 I/O last 3 completed shifts: In: 3603.1 [P.O.:1550; I.V.:1303.1; IV Piggyback:750] Out: 3850 [Urine:3850] Total I/O In: 1327.8 [I.V.:1177.8; IV Piggyback:150] Out: 750 [Urine:750]  FHT:  FHR: 130/140 bpm, variability: moderate x 2,  accelerations:  Present,  decelerations:  Absent UC:   regular, every 3-4 minutes SVE:   4/100/-1 Dois Davenport, RN)  Labs: Lab Results  Component Value Date   WBC 13.2* 12/28/2011   HGB 10.4* 12/28/2011   HCT 30.9* 12/28/2011   MCV 92.2 12/28/2011   PLT 168 12/28/2011    Assessment / Plan: Induction of labor due to preeclampsia and PROM,  progressing well on pitocin  Labor: Progressing on pitocin Preeclampsia:  on magnesium sulfate, no signs or symptoms of toxicity and intake and ouput balanced Fetal Wellbeing:  Category I Pain Control:  Epidural I/D:  n/a Anticipated MOD:  NSVD  Allessandra Bernardi 12/28/2011, 4:46 PM

## 2011-12-28 NOTE — Anesthesia Preprocedure Evaluation (Addendum)
Anesthesia Evaluation  Patient identified by MRN, date of birth, ID band Patient awake    Reviewed: Allergy & Precautions, H&P , Patient's Chart, lab work & pertinent test results  Airway Mallampati: III TM Distance: >3 FB Neck ROM: full    Dental  (+) Teeth Intact   Pulmonary  breath sounds clear to auscultation        Cardiovascular hypertension, Pt. on medications Rhythm:regular Rate:Normal     Neuro/Psych  Neuromuscular disease (Mulptiple Sclerosisc.....Marland Kitchencurrently no deficits)    GI/Hepatic GERD-  ,  Endo/Other  Morbid obesity  Renal/GU      Musculoskeletal   Abdominal   Peds  Hematology   Anesthesia Other Findings  Discussed uncertain nature of MS and unpredictibility of progression with Regional anesthesia     Reproductive/Obstetrics (+) Pregnancy                         Anesthesia Physical Anesthesia Plan  ASA: III  Anesthesia Plan: Epidural   Post-op Pain Management:    Induction:   Airway Management Planned:   Additional Equipment:   Intra-op Plan:   Post-operative Plan:   Informed Consent: I have reviewed the patients History and Physical, chart, labs and discussed the procedure including the risks, benefits and alternatives for the proposed anesthesia with the patient or authorized representative who has indicated his/her understanding and acceptance.   Dental Advisory Given  Plan Discussed with:   Anesthesia Plan Comments: (Labs checked- platelets confirmed with RN in room. Fetal heart tracing, per RN, reported to be stable enough for sitting procedure. Discussed epidural, and patient consents to the procedure:  included risk of possible headache,backache, failed block, allergic reaction, and nerve injury. This patient was asked if she had any questions or concerns before the procedure started. )        Anesthesia Quick Evaluation

## 2011-12-28 NOTE — Progress Notes (Signed)
HD #2  S. She has had a bad headache since about 0500. She denies visual changes. She also complains of new onset painful contraction. She reports good FM.  O. VSS, AF    CVX-3/100/+1    DRT- 3+    Platelets now 150K, was 193K    Creatinine now 0.6, was 0.3  A/P. Severe pre eclampsia- at 32 weeks status post BMZ x 2  I will move her to L&D for pitocin augmentation. Magnesium ordered.

## 2011-12-28 NOTE — Anesthesia Procedure Notes (Addendum)
Spinal   Epidural Patient location during procedure: OB  Preanesthetic Checklist Completed: patient identified, site marked, surgical consent, pre-op evaluation, timeout performed, IV checked, risks and benefits discussed and monitors and equipment checked  Epidural Patient position: sitting Prep: site prepped and draped and DuraPrep Patient monitoring: continuous pulse ox and blood pressure Approach: midline Injection technique: LOR air  Needle:  Needle type: Tuohy  Needle gauge: 17 G Needle length: 9 cm and 9 Needle insertion depth: 7 cm Catheter type: closed end flexible Catheter size: 19 Gauge Catheter at skin depth: 15 cm Test dose: negative  Assessment Events: blood not aspirated, injection not painful, no injection resistance, negative IV test and no paresthesia  Additional Notes Dosing of Epidural:  1st dose, through catheter ............................................Marland Kitchen epi 1:200K + Xylocaine 40 mg  2nd dose, through catheter, after waiting 3 minutes...Marland KitchenMarland Kitchenepi 1:200K + Xylocaine 60 mg    ( 2% Xylo charted as a single dose in Epic Meds for ease of charting; actual dosing was fractionated as above, for saftey's sake)  As each dose occurred, patient was free of IV sx; and patient exhibited no evidence of SA injection.  Patient is more comfortable after epidural dosed. Please see RN's note for documentation of vital signs,and FHR which are stable.  Patient reminded not to try to ambulate with numb legs, and that an RN must be present when she attempts to get up.

## 2011-12-29 ENCOUNTER — Inpatient Hospital Stay (HOSPITAL_COMMUNITY): Payer: BC Managed Care – PPO

## 2011-12-29 ENCOUNTER — Encounter (HOSPITAL_COMMUNITY)
Admission: AD | Disposition: A | Discharge status: Home / Self Care | Payer: Self-pay | Source: Ambulatory Visit | Attending: Obstetrics and Gynecology

## 2011-12-29 ENCOUNTER — Encounter (HOSPITAL_COMMUNITY): Payer: Self-pay | Admitting: *Deleted

## 2011-12-29 ENCOUNTER — Inpatient Hospital Stay (HOSPITAL_COMMUNITY): Payer: BC Managed Care – PPO | Admitting: Anesthesiology

## 2011-12-29 ENCOUNTER — Encounter (HOSPITAL_COMMUNITY): Payer: Self-pay | Admitting: Anesthesiology

## 2011-12-29 HISTORY — PX: DILATION AND EVACUATION: SHX1459

## 2011-12-29 LAB — COMPREHENSIVE METABOLIC PANEL
AST: 21 U/L (ref 0–37)
Albumin: 2 g/dL — ABNORMAL LOW (ref 3.5–5.2)
Alkaline Phosphatase: 67 U/L (ref 39–117)
Chloride: 104 mEq/L (ref 96–112)
Creatinine, Ser: 0.54 mg/dL (ref 0.50–1.10)
Potassium: 3.1 mEq/L — ABNORMAL LOW (ref 3.5–5.1)
Total Bilirubin: 0.2 mg/dL — ABNORMAL LOW (ref 0.3–1.2)

## 2011-12-29 LAB — CBC
MCH: 31.1 pg (ref 26.0–34.0)
MCV: 92.1 fL (ref 78.0–100.0)
Platelets: 200 10*3/uL (ref 150–400)
Platelets: 161 10*3/uL (ref 150–400)
RBC: 3.02 MIL/uL — ABNORMAL LOW (ref 3.87–5.11)
RDW: 13.7 % (ref 11.5–15.5)
WBC: 14.7 10*3/uL — ABNORMAL HIGH (ref 4.0–10.5)

## 2011-12-29 SURGERY — Surgical Case
Anesthesia: Epidural | Site: Vagina

## 2011-12-29 MED ORDER — FENTANYL CITRATE 0.05 MG/ML IJ SOLN
INTRAMUSCULAR | Status: AC
  Filled 2011-12-29: qty 2

## 2011-12-29 MED ORDER — CITRIC ACID-SODIUM CITRATE 334-500 MG/5ML PO SOLN
ORAL | Status: AC
  Filled 2011-12-29: qty 15

## 2011-12-29 MED ORDER — DIBUCAINE 1 % RE OINT
1.0000 "application " | TOPICAL_OINTMENT | RECTAL | Status: DC | PRN
  Filled 2011-12-29: qty 28

## 2011-12-29 MED ORDER — ONDANSETRON HCL 4 MG/2ML IJ SOLN: 4.0000 mg | INTRAMUSCULAR | Status: DC | PRN

## 2011-12-29 MED ORDER — LANOLIN HYDROUS EX OINT: TOPICAL_OINTMENT | CUTANEOUS | Status: DC | PRN

## 2011-12-29 MED ORDER — CALCIUM CARBONATE ANTACID 500 MG PO CHEW
2.0000 | CHEWABLE_TABLET | ORAL | Status: DC | PRN
Start: 2011-12-29 — End: 2011-12-31
  Administered 2011-12-29 – 2011-12-30 (×2): 400 mg via ORAL
  Filled 2011-12-29 (×2): qty 2

## 2011-12-29 MED ORDER — FENTANYL CITRATE 0.05 MG/ML IJ SOLN: 100.0000 ug | INTRAMUSCULAR | Status: AC

## 2011-12-29 MED ORDER — SODIUM CHLORIDE 0.9 % IV BOLUS (SEPSIS)
1000.0000 mL | Freq: Once | INTRAVENOUS | Status: AC
  Administered 2011-12-29: 1000 mL via INTRAVENOUS

## 2011-12-29 MED ORDER — CITRIC ACID-SODIUM CITRATE 334-500 MG/5ML PO SOLN: 30.0000 mL | Freq: Once | ORAL | Status: DC

## 2011-12-29 MED ORDER — SENNOSIDES-DOCUSATE SODIUM 8.6-50 MG PO TABS
2.0000 | ORAL_TABLET | Freq: Every day | ORAL | Status: DC
  Administered 2011-12-29 – 2011-12-30 (×2): 2 via ORAL

## 2011-12-29 MED ORDER — MIDAZOLAM HCL 5 MG/5ML IJ SOLN
INTRAMUSCULAR | Status: DC | PRN
  Administered 2011-12-29: 2 mg via INTRAVENOUS
  Administered 2011-12-29: 1 mg via INTRAVENOUS

## 2011-12-29 MED ORDER — PRENATAL MULTIVITAMIN CH
1.0000 | ORAL_TABLET | Freq: Every day | ORAL | Status: DC
  Administered 2011-12-30 – 2011-12-31 (×2): 1 via ORAL
  Filled 2011-12-29 (×3): qty 1

## 2011-12-29 MED ORDER — ONDANSETRON HCL 4 MG/2ML IJ SOLN
INTRAMUSCULAR | Status: AC
  Filled 2011-12-29: qty 2

## 2011-12-29 MED ORDER — LIDOCAINE-EPINEPHRINE (PF) 2 %-1:200000 IJ SOLN
INTRAMUSCULAR | Status: AC
  Filled 2011-12-29: qty 20

## 2011-12-29 MED ORDER — OXYCODONE-ACETAMINOPHEN 5-325 MG PO TABS: 1.0000 | ORAL_TABLET | ORAL | Status: DC | PRN

## 2011-12-29 MED ORDER — ZOLPIDEM TARTRATE 5 MG PO TABS: 5.0000 mg | ORAL_TABLET | Freq: Every evening | ORAL | Status: DC | PRN

## 2011-12-29 MED ORDER — MIDAZOLAM HCL 2 MG/2ML IJ SOLN
INTRAMUSCULAR | Status: AC
  Filled 2011-12-29: qty 2

## 2011-12-29 MED ORDER — MAGNESIUM SULFATE 40 G IN LACTATED RINGERS - SIMPLE
2.0000 g/h | INTRAVENOUS | Status: AC
  Administered 2011-12-29: 2 g/h via INTRAVENOUS
  Filled 2011-12-29 (×2): qty 500

## 2011-12-29 MED ORDER — LACTATED RINGERS IV SOLN
INTRAVENOUS | Status: DC | PRN
  Administered 2011-12-29 (×2): via INTRAVENOUS

## 2011-12-29 MED ORDER — BENZOCAINE-MENTHOL 20-0.5 % EX AERO
1.0000 "application " | INHALATION_SPRAY | CUTANEOUS | Status: DC | PRN
  Filled 2011-12-29: qty 56

## 2011-12-29 MED ORDER — SODIUM BICARBONATE 8.4 % IV SOLN
INTRAVENOUS | Status: DC | PRN
  Administered 2011-12-29: 3 mL via EPIDURAL

## 2011-12-29 MED ORDER — HYDROCHLOROTHIAZIDE 25 MG PO TABS
25.0000 mg | ORAL_TABLET | Freq: Every day | ORAL | Status: DC
  Administered 2011-12-30 – 2011-12-31 (×3): 25 mg via ORAL
  Filled 2011-12-29 (×4): qty 1

## 2011-12-29 MED ORDER — MEPERIDINE HCL 25 MG/ML IJ SOLN
INTRAMUSCULAR | Status: AC
  Filled 2011-12-29: qty 1

## 2011-12-29 MED ORDER — OXYTOCIN 10 UNIT/ML IJ SOLN
40.0000 [IU] | INTRAMUSCULAR | Status: AC
  Filled 2011-12-29: qty 4

## 2011-12-29 MED ORDER — SIMETHICONE 80 MG PO CHEW: 80.0000 mg | CHEWABLE_TABLET | ORAL | Status: DC | PRN

## 2011-12-29 MED ORDER — FENTANYL CITRATE 0.05 MG/ML IJ SOLN
INTRAMUSCULAR | Status: AC
  Administered 2011-12-29: 100 ug via INTRAVENOUS
  Filled 2011-12-29: qty 2

## 2011-12-29 MED ORDER — FENTANYL CITRATE 0.05 MG/ML IJ SOLN
INTRAMUSCULAR | Status: DC | PRN
  Administered 2011-12-29: 100 ug via INTRAVENOUS

## 2011-12-29 MED ORDER — SODIUM BICARBONATE 8.4 % IV SOLN
INTRAVENOUS | Status: AC
  Filled 2011-12-29: qty 50

## 2011-12-29 MED ORDER — OXYTOCIN 40 UNITS IN LACTATED RINGERS INFUSION - SIMPLE MED
INTRAVENOUS | Status: AC
  Filled 2011-12-29: qty 1000

## 2011-12-29 MED ORDER — IBUPROFEN 600 MG PO TABS
600.0000 mg | ORAL_TABLET | Freq: Four times a day (QID) | ORAL | Status: DC
  Administered 2011-12-29 – 2011-12-31 (×8): 600 mg via ORAL
  Filled 2011-12-29 (×9): qty 1

## 2011-12-29 MED ORDER — LACTATED RINGERS IV SOLN
INTRAVENOUS | Status: DC
  Administered 2011-12-29: 20:00:00 via INTRAVENOUS

## 2011-12-29 MED ORDER — FENTANYL CITRATE 0.05 MG/ML IJ SOLN: 25.0000 ug | INTRAMUSCULAR | Status: DC | PRN

## 2011-12-29 MED ORDER — ONDANSETRON HCL 4 MG/2ML IJ SOLN
INTRAMUSCULAR | Status: DC | PRN
  Administered 2011-12-29: 4 mg via INTRAVENOUS

## 2011-12-29 MED ORDER — DIPHENHYDRAMINE HCL 25 MG PO CAPS: 25.0000 mg | ORAL_CAPSULE | Freq: Four times a day (QID) | ORAL | Status: DC | PRN

## 2011-12-29 MED ORDER — TETANUS-DIPHTH-ACELL PERTUSSIS 5-2.5-18.5 LF-MCG/0.5 IM SUSP
0.5000 mL | Freq: Once | INTRAMUSCULAR | Status: DC
  Filled 2011-12-29: qty 0.5

## 2011-12-29 MED ORDER — ONDANSETRON HCL 4 MG PO TABS: 4.0000 mg | ORAL_TABLET | ORAL | Status: DC | PRN

## 2011-12-29 MED ORDER — WITCH HAZEL-GLYCERIN EX PADS: 1.0000 "application " | MEDICATED_PAD | CUTANEOUS | Status: DC | PRN

## 2011-12-29 MED ORDER — LACTATED RINGERS IV SOLN
INTRAVENOUS | Status: DC
  Administered 2011-12-29: 125 mL/h via INTRAVENOUS

## 2011-12-29 MED ORDER — MEPERIDINE HCL 25 MG/ML IJ SOLN
INTRAMUSCULAR | Status: DC | PRN
  Administered 2011-12-29: 12.5 mg via INTRAVENOUS

## 2011-12-29 SURGICAL SUPPLY — 29 items
CATH ROBINSON RED A/P 16FR (CATHETERS) ×3 IMPLANT
CLOTH BEACON ORANGE TIMEOUT ST (SAFETY) ×3 IMPLANT
DECANTER SPIKE VIAL GLASS SM (MISCELLANEOUS) ×3 IMPLANT
GLOVE BIOGEL PI IND STRL 7.5 (GLOVE) ×4 IMPLANT
GLOVE BIOGEL PI INDICATOR 7.5 (GLOVE) ×2
GLOVE ECLIPSE 9.0 STRL (GLOVE) ×6 IMPLANT
GLOVE SS N UNI LF 6.0 STRL (GLOVE) ×3 IMPLANT
GLOVE SS N UNI LF 6.5 STRL (GLOVE) ×6 IMPLANT
GLOVE SURG SS PI 7.5 STRL IVOR (GLOVE) ×6 IMPLANT
GOWN PREVENTION PLUS XXLARGE (GOWN DISPOSABLE) ×3 IMPLANT
GOWN STRL NON-REIN LRG LVL3 (GOWN DISPOSABLE) ×3 IMPLANT
GOWN STRL REIN 3XL LVL4 (GOWN DISPOSABLE) ×3 IMPLANT
GOWN STRL REIN XL XLG (GOWN DISPOSABLE) ×6 IMPLANT
KIT BERKELEY 1ST TRIMESTER 3/8 (MISCELLANEOUS) ×3 IMPLANT
NEEDLE SPNL 22GX3.5 QUINCKE BK (NEEDLE) ×3 IMPLANT
NS IRRIG 1000ML POUR BTL (IV SOLUTION) ×3 IMPLANT
PACK VAGINAL MINOR WOMEN LF (CUSTOM PROCEDURE TRAY) ×3 IMPLANT
PAD OB MATERNITY 4.3X12.25 (PERSONAL CARE ITEMS) ×3 IMPLANT
PAD PREP 24X48 CUFFED NSTRL (MISCELLANEOUS) ×3 IMPLANT
SET BERKELEY SUCTION TUBING (SUCTIONS) ×3 IMPLANT
SYR CONTROL 10ML LL (SYRINGE) ×3 IMPLANT
TOWEL OR 17X24 6PK STRL BLUE (TOWEL DISPOSABLE) ×6 IMPLANT
TUBE VACURETTE 2ND TRIMESTER (CANNULA) ×3 IMPLANT
VACURETTE 10 RIGID CVD (CANNULA) IMPLANT
VACURETTE 12 RIGID CVD (CANNULA) IMPLANT
VACURETTE 16MM ASPIR CVD .5 (CANNULA) ×3 IMPLANT
VACURETTE 7MM CVD STRL WRAP (CANNULA) IMPLANT
VACURETTE 8 RIGID CVD (CANNULA) IMPLANT
VACURETTE 9 RIGID CVD (CANNULA) IMPLANT

## 2011-12-29 NOTE — Op Note (Signed)
Charlene [name] PROCEDURE DATE: 12/27/2011 - 12/29/2011  PREOPERATIVE DIAGNOSIS: 31 yo [id number] s/p vaginal delivery of twins with retained products of conception. POSTOPERATIVE DIAGNOSIS: The same. PROCEDURE:     Dilation and Evacuation. SURGEON:  Dr. Catalina Antigua  INDICATIONS: 31 y.o. [id number] s/p vaginal delivery of twin pregnancy with retained products of conception.  Risks of surgery were discussed with the patient including but not limited to: bleeding which may require transfusion; infection which may require antibiotics; injury to uterus or surrounding organs;need for additional procedures including laparotomy or laparoscopy; possibility of intrauterine scarring which may impair future fertility; and other postoperative/anesthesia complications. Written informed consent was obtained.    FINDINGS:  A 20-week size uterus, moderate amounts of products of conception, specimen sent to pathology.  ANESTHESIA:    Epidural INTRAVENOUS FLUIDS:  1000 ml of LR ESTIMATED BLOOD LOSS:  500 ml of blood clots evacuated prior to D&E in OR. SPECIMENS:  Products of conception sent to pathology COMPLICATIONS:  None immediate.  PROCEDURE DETAILS:  The patient was taken to the operating room where anesthesia was administered and was found to be adequate.  After an adequate timeout was performed, she was placed in the dorsal lithotomy position and examined; then prepped and draped in the sterile manner.   A foley was already in place. Manual exploration of uterus revealed approximately 500 cc blood clots filling the cavity which were evacuated. A vaginal speculum was then placed in the patient's vagina and a ring Forcep was applied to the anterior lip of the cervix.  A 16 mm suction curette that was gently advanced to the uterine fundus.  The suction device was then activated and curette slowly rotated to clear the uterus of products of conception.  A sharp curettage was then performed to confirm complete emptying  of the uterus. There was minimal bleeding noted and the ring Forceps was removed with good hemostasis noted.   An intraoperative ultrasound, performed by me, revealed a thin endometrial strip and uterine size was reduced to 18 week size. All instruments were removed from the patient's vagina. The patient tolerated the procedure well and was taken to the recovery area awake, and in stable condition.  The patient will be transferred to postpartum unit for routine postpartum care

## 2011-12-29 NOTE — Progress Notes (Signed)
Discussed with Dr Jean Rosenthal.  Pt does not need sodium citrate prior to going for [facility]

## 2011-12-29 NOTE — Anesthesia Preprocedure Evaluation (Signed)
Anesthesia Evaluation  Patient identified by MRN, date of birth, ID band Patient awake    Reviewed: Allergy & Precautions, H&P , Patient's Chart, lab work & pertinent test results  Airway Mallampati: III TM Distance: >3 FB Neck ROM: full    Dental  (+) Teeth Intact   Pulmonary  breath sounds clear to auscultation        Cardiovascular hypertension, Pt. on medications Rhythm:regular Rate:Normal     Neuro/Psych  Neuromuscular disease (Mulptiple Sclerosisc......currently no deficits)    GI/Hepatic GERD-  ,  Endo/Other  Morbid obesity  Renal/GU      Musculoskeletal   Abdominal   Peds  Hematology   Anesthesia Other Findings  Discussed uncertain nature of MS and unpredictibility of progression with Regional anesthesia     Reproductive/Obstetrics (+) Pregnancy                         Anesthesia Physical Anesthesia Plan  ASA: III  Anesthesia Plan: Epidural   Post-op Pain Management:    Induction:   Airway Management Planned:   Additional Equipment:   Intra-op Plan:   Post-operative Plan:   Informed Consent: I have reviewed the patients History and Physical, chart, labs and discussed the procedure including the risks, benefits and alternatives for the proposed anesthesia with the patient or authorized representative who has indicated his/her understanding and acceptance.   Dental Advisory Given  Plan Discussed with:   Anesthesia Plan Comments: (Labs checked- platelets confirmed with RN in room. Fetal heart tracing, per RN, reported to be stable enough for sitting procedure. Discussed epidural, and patient consents to the procedure:  included risk of possible headache,backache, failed block, allergic reaction, and nerve injury. This patient was asked if she had any questions or concerns before the procedure started. )        Anesthesia Quick Evaluation  

## 2011-12-29 NOTE — Progress Notes (Signed)
Patient s/p delivery of twins with retained products of conception s/p manual extraction of placenta. Patient was counseled on need for D&C. Risks, benefits and alternatives were reviewed with the patient including but not limited to risks of bleeding, infection, uterine perforation and damage to adjacent organs. Patient verbalized understanding and all questions were answered.

## 2011-12-29 NOTE — Anesthesia Postprocedure Evaluation (Signed)
  Anesthesia Post-op Note  Patient: Charlene [name]  Procedure(s) Performed: Procedure(s) (LRB) with comments: DILATATION AND EVACUATION () - Dr, Emelda Fear transferred case to Dr. Jolayne Panther   Patient is awake, responsive, moving her legs, and has signs of resolution of her numbness. Pain and nausea are reasonably well controlled. Vital signs are stable and clinically acceptable. Oxygen saturation is clinically acceptable. There are no apparent anesthetic complications at this time. Patient is ready for discharge.

## 2011-12-29 NOTE — Progress Notes (Addendum)
Delivery Note   Charlene [name], Charlene [name] [[id number]]  At 10:24 PM a viable and healthy female was delivered via Vaginal, Spontaneous Delivery (Presentation: ;  ).  APGAR: 8, 9; weight .   Placenta status:manual extraction intact at 11:40 , .  Cord: 3 vessels with the following complications: prolonged retained placenta x over 60 minutes.  Anesthesia: Epidural  Episiotomy: None Lacerations:  Suture Repair: no Est. Blood Loss (mL):     Charlene [name], Charlene [name] [[id number]]  At 11:01 PM a viable and healthy female was delivered via Vaginal, Spontaneous Delivery (Presentation: ; Occiput Posterior).  APGAR: 6, 8; weight 4 lb 9 oz (2070 g).   Placenta status: , .  Cord: 3 vessels with the following complications: Delivery with the baby's arm up beside the head, without affecting the delivery process..  Anesthesia: Epidural  Episiotomy: None Lacerations: none Suture Repair: none Est. Blood Loss (mL): 600 The patient had retained placenta, with multiple efforts at uterine massage and manual exploaration  Required . We eventually had cord avulsion of the second baby's cord. AT 45 minutes,we were able to get the first placenta out intact, 3vc, and to see the edge of the second placenta, and to get ring forceps on the edge , and have the patient push and expel the second placenta in a macerated shape. It was not possible to located any bleeding from the placenta  Mom to stay in L&d overnight due to concerns re: pp hemorrhage.   Baby A to NICU.   Baby B to NICU.  Charlene [name] 12/29/2011, 1:10 AM   Called at 1 am to see patient due to pp hemorrhage and uterine cramping. Speculum exam shows 500 or more cc's of blood per vagina in clots, and ring forceps used to explore the lower uterine segment, removing clots but NO tissue fragments identifiable.  Pt placed as NPO, and as of 3 am there have been no more clots passed.  We will order ultrasound to try to assess for retained placental fragments.

## 2011-12-29 NOTE — Progress Notes (Signed)
Dr, Emelda Fear at bedside, discussing results of ultrasound, discussing need to remove retained placental fragments, explaining procedure to pt along with risks and benefits, pt verbalizes understanding and consents

## 2011-12-29 NOTE — Progress Notes (Signed)
Dr. Emelda Fear updated on pt status,

## 2011-12-29 NOTE — Transfer of Care (Signed)
Immediate Anesthesia Transfer of Care Note  Patient: Charlene [name]  Procedure(s) Performed: Procedure(s) (LRB) with comments: DILATATION AND EVACUATION () - Dr, Emelda Fear transferred case to Dr. Jolayne Panther  Patient Location: PACU  Anesthesia Type:Epidural  Level of Consciousness: awake, alert  and oriented  Airway & Oxygen Therapy: Patient Spontanous Breathing  Post-op Assessment: Report given to PACU RN, Post -op Vital signs reviewed and stable and Patient moving all extremities X 4  Post vital signs: Reviewed and stable  Complications: No apparent anesthesia complications

## 2011-12-29 NOTE — OR Nursing (Signed)
Case to be performed by Dr. Jolayne Panther per Dr. Emelda Fear

## 2011-12-29 NOTE — Anesthesia Postprocedure Evaluation (Signed)
  Anesthesia Post-op Note  Patient: Charlene [name]  Procedure(s) Performed: * No procedures listed *  Patient Location: 152  Anesthesia Type:Epidural  Level of Consciousness: awake, alert  and oriented  Airway and Oxygen Therapy: Patient Spontanous Breathing  Post-op Pain: none  Post-op Assessment: Post-op Vital signs reviewed, Patient's Cardiovascular Status Stable, No headache, No backache, No residual numbness and No residual motor weakness  Post-op Vital Signs: Reviewed and stable  Complications: No apparent anesthesia complications

## 2011-12-29 NOTE — Progress Notes (Signed)
Dr. Emelda Fear called updated on pt status and lab results, orders to monitor pt closely and give update in 1 hr

## 2011-12-29 NOTE — Progress Notes (Signed)
To OR per cart

## 2011-12-29 NOTE — Addendum Note (Signed)
Addendum  created 12/29/11 1713 by Graciela Husbands, CRNA   Modules edited:Charges VN, Notes Section

## 2011-12-29 NOTE — Anesthesia Postprocedure Evaluation (Signed)
  Anesthesia Post-op Note  Patient: Charlene [name]  Procedure(s) Performed: Procedure(s) (LRB) with comments: DILATATION AND EVACUATION () - Dr, Emelda Fear transferred case to Dr. Jolayne Panther  Patient Location: Antenatal  Anesthesia Type:Epidural  Level of Consciousness: awake, alert  and oriented  Airway and Oxygen Therapy: Patient Spontanous Breathing  Post-op Pain: none  Post-op Assessment: Post-op Vital signs reviewed, Patient's Cardiovascular Status Stable, No headache, No backache, No residual numbness and No residual motor weakness  Post-op Vital Signs: Reviewed and stable  Complications: No apparent anesthesia complications

## 2011-12-30 MED ORDER — PIPERACILLIN-TAZOBACTAM 3.375 G IVPB
3.3750 g | Freq: Three times a day (TID) | INTRAVENOUS | Status: DC
  Administered 2011-12-30 – 2011-12-31 (×5): 3.375 g via INTRAVENOUS
  Filled 2011-12-30 (×7): qty 50

## 2011-12-30 MED ORDER — PIPERACILLIN-TAZOBACTAM 3.375 G IVPB 30 MIN: 3.3750 g | Freq: Three times a day (TID) | INTRAVENOUS | Status: DC

## 2011-12-30 NOTE — Clinical Social Work Note (Signed)
CSW attempted x2 today to see [name] for new NICU admission.  CSW will pass consult on to weekday CSW. 

## 2011-12-30 NOTE — Progress Notes (Signed)
4540- pt transferred to room 303 by wheelchair.

## 2011-12-30 NOTE — Progress Notes (Signed)
Post Partum Day 2 Subjective: no complaints, up ad lib, voiding, tolerating PO and she denies headaches, visual disturbances, RUQ, epigastric pain. Moderate vaginal bleeding without blood clots  Objective: Blood pressure 137/66, pulse 109, temperature 98.4 F (36.9 C), temperature source Oral, resp. rate 20, height 5' 6.5" (1.689 m), weight 279 lb 6 oz (126.724 kg), last menstrual period 12/27/2010, SpO2 96.00%, unknown if currently breastfeeding.  Physical Exam:  General: alert, cooperative and no distress Lochia: appropriate Uterine Fundus: firm, NT Incision: n/a DVT Evaluation: No evidence of DVT seen on physical exam. No cords or calf tenderness.   Basename 12/29/11 0543 12/29/11 0124  HGB 8.8* 9.4*  HCT 25.8* 27.8*    Assessment/Plan: Plan for discharge tomorrow Patient s/p Magnesium sulfate at 2300. HCTZ added following discontinuation of magnesium sulfate secondary to elevated BP Continue monitoring BP today   LOS: 3 days   Charlene [name] 12/30/2011, 6:39 AM

## 2011-12-31 ENCOUNTER — Encounter (HOSPITAL_COMMUNITY)
Admission: RE | Admit: 2011-12-31 | Discharge: 2011-12-31 | Disposition: A | Discharge status: Home / Self Care | Payer: BC Managed Care – PPO | Source: Ambulatory Visit | Attending: Obstetrics & Gynecology | Admitting: Obstetrics & Gynecology

## 2011-12-31 ENCOUNTER — Encounter (HOSPITAL_COMMUNITY): Payer: Self-pay | Admitting: Obstetrics and Gynecology

## 2011-12-31 DIAGNOSIS — O923 Agalactia: Secondary | ICD-10-CM | POA: Insufficient documentation

## 2011-12-31 LAB — CULTURE, BETA STREP (GROUP B ONLY)

## 2011-12-31 LAB — TYPE AND SCREEN
Antibody Screen: NEGATIVE
Unit division: 0

## 2011-12-31 MED ORDER — HYDROCHLOROTHIAZIDE 25 MG PO TABS: 25.0000 mg | ORAL_TABLET | Freq: Every day | ORAL | Status: DC

## 2011-12-31 MED ORDER — IBUPROFEN 600 MG PO TABS: 600.0000 mg | ORAL_TABLET | Freq: Four times a day (QID) | ORAL | Status: DC

## 2011-12-31 NOTE — Progress Notes (Signed)
Pt called at home to ask about Tdapt  Injection   Pt states she  Did not get and  inijection   Informed pt to get injection whe she returnes   To clinic for b/p check

## 2011-12-31 NOTE — Discharge Summary (Signed)
Obstetric Discharge Summary  Charlene [name] is a 31 y.o. [phone] with Di/Di twins (after IVF) who presented with PPROM at [redacted] wk gestation. She developed preeclampsia and was moved to L&D for labor induction and magnesium. Both twins were vertex at the time of induction. She went on to have spontaneous vaginal delivery of both twins with manual extraction of placentas. Following delivery, she continued to have heavy bleeding and was found to have retained products of conception on ultrasound. Therefore she was taken to the OR for D&C.  Reason for Admission: PPROM Prenatal Procedures: Pre-eclampsia management Intrapartum Procedures: spontaneous vaginal delivery and manual extraction of placentas Postpartum Procedures: dilation and evacuation/curettage Complications-Operative and Postpartum: elevated BP's, retained POC requiring D&C as above Hemoglobin  Date Value Range Status  12/29/2011 8.8* 12.0 - 15.0 g/dL Final     HCT  Date Value Range Status  12/29/2011 25.8* 36.0 - 46.0 % Final    Physical Exam:  General: alert, cooperative and no distress Lochia: appropriate Uterine Fundus: firm DVT Evaluation: No evidence of DVT seen on physical exam.  Discharge Diagnoses: Premature labor and PPROM, SVD of female twins, retained POC requiring D&C  Discharge Information: Date: 12/31/2011 Activity: pelvic rest Diet: routine Medications: PNV, Ibuprofen, Flexeril, HCTZ, Excedrin Migraine, Tylenol PM Condition: stable Instructions: refer to practice specific booklet Discharge to: home Follow-up Information    Follow up with [facility]. In 1 week. (blood pressure check and 4 to 6 weeks for post-partum visit)    Contact information:   [address] [place] [address] [phone]         Newborn Data:   Huyen, Perazzo [[id number]]  Live born female  Birth Weight: 4 lb 2.3 oz (1880 g) APGAR: 8, [address]   Sandria, Mcenroe [[id number]]  Live born  female  Birth Weight: 4 lb 9 oz (2070 g) APGAR: 6, 8  Babies to remain in NICU.  Street, Christopher 12/31/2011, 1:39 PM  I have seen and examined patient and agree with above. Patient will follow up in [facility] in 4 to 6 weeks for postpartum check.  Napoleon Form, MD

## 2011-12-31 NOTE — Progress Notes (Signed)
Pt ambulated out  Teaching complete 

## 2011-12-31 NOTE — Progress Notes (Signed)
Post Partum Day 3 Subjective: Pt without complaint this morning. Eager to go home. Denies new visual disturbance; blurred vision improving. No headaches or new/worse swelling. Still with some cramping/bleeding, but also improving.  Objective: Blood pressure 143/93, pulse 99, temperature 98.1 F (36.7 C), temperature source Oral, resp. rate 20, height 5' 6.5" (1.689 m), weight 118.854 kg (262 lb 0.4 oz), last menstrual period 12/27/2010, SpO2 98.00%, unknown if currently breastfeeding.  Physical Exam:  General: alert, cooperative and no distress Lochia: appropriate Uterine Fundus: firm DVT Evaluation: No evidence of DVT seen on physical exam.   Basename 12/29/11 0543 12/29/11 0124  HGB 8.8* 9.4*  HCT 25.8* 27.8*    Assessment/Plan: Plan for discharge later today, pending CSW consult. S/p magnesium for 24h after delivery. BP's still mildly elevated this morning. Continue HCTZ. Contraception undecided, planning abstinence for the next several weeks, then condom use.   LOS: 4 days   Street, Cristal Deer 12/31/2011, 7:56 AM   Attestation of Attending Supervision of Resident: Evaluation and management procedures were performed by the [facility] Medicine Resident under my supervision.  I have seen and examined the patient, reviewed the resident's note and chart, and I agree with the management and plan.  Jaynie Collins, MD, FACOG Attending Obstetrician & Gynecologist Faculty Practice, [facility]

## 2011-12-31 NOTE — Progress Notes (Signed)
I received a referral to visit this patient from her nurse.  Alisabeth has some anxiety about her twins being so early, but is coping appropriately.  She is taking one day at a time and trying not to put too many expectations on how she will feel as the NICU stay continues.  We will continue to support their family as we see them in the NICU, but please also page as needs arise.    [employer] Pager, [phone] 11:59 AM

## 2011-12-31 NOTE — Progress Notes (Signed)
Continued UR review completed. 

## 2011-12-31 NOTE — Discharge Instructions (Signed)
Postpartum Care After Vaginal Delivery  After you deliver your baby, you will stay in the hospital for 24 to 72 hours, unless there were problems with the labor or delivery, or you have medical problems. While you are in the hospital, you will receive help and instructions on how to care for yourself and your baby.  Your doctor will order pain medicine, in case you need it. You will have a small amount of bleeding from your vagina and should change your sanitary pad frequently. Wash your hands thoroughly with soap and water for at least 20 seconds after changing pads and using the toilet. Let the nurses know if you begin to pass blood clots or your bleeding increases. Do not flush blood clots down the toilet before having the nurse look at them, to make sure there is no placental tissue with them.  If you had an intravenous (IV), it will be removed within 24 hours, if there are no problems. The first time you get out of bed or take a shower, call the nurse to help you because you may get weak, lightheaded, or even faint. If you are breastfeeding, you may feel painful contractions of your uterus for a couple of weeks. This is normal. The contractions help your uterus get back to normal size. If you are not breastfeeding, wear a supportive bra and handle your breasts as little as possible until your milk has dried up. Hormones should not be given to dry up the breasts, because they can cause blood clots. You will be given your normal diet, unless you have diabetes or other medical problems.   The nurses may put an ice pack on your episiotomy (surgically enlarged opening), if you have one, to reduce the pain and swelling. On rare occasions, you may not be able to urinate and the nurse will need to empty your bladder with a catheter. If you had a postpartum tubal ligation ("tying tubes," female sterilization), it should not make your stay in the hospital longer.  You may have your baby in your room with you as much as  you like, unless you or the baby has a problem. Use the bassinet (basket) for the baby when going to and from the nursery. Do not carry the baby. Do not leave the postpartum area. If the mother is Rh negative (lacks a protein on the red blood cells) and the baby is Rh positive, the mother should get a Rho-gam shot to prevent Rh problems with future pregnancies.  You may be given written instructions for you and your baby, and necessary medicines, when you are discharged from the hospital. Be sure you understand and follow the instructions as advised.  HOME CARE INSTRUCTIONS    Follow instructions and take the medicines given to you.   Only take over-the-counter or prescription medicines for pain, discomfort, or fever as directed by your caregiver.   Do not take aspirin, because it can cause bleeding.   Increase your activities a little bit every day to build up your strength and endurance.   Do not drink alcohol, especially if you are breastfeeding or taking pain medicine.   Take your temperature twice a day and record it.   You may have a small amount of bleeding or spotting for 2 to 4 weeks. This is normal.   Do not use tampons or douche. Use sanitary pads.   Try to have someone stay and help you for a few days when you go home.     Try to rest or take a nap when the baby is sleeping.   If you are breastfeeding, wear a good support bra. If you are not breastfeeding, wear a supportive bra and do not stimulate your nipples.   Eat a healthy, nutritious diet and continue to take your prenatal vitamins.   Do not drive, do any heavy activities, or travel until your caregiver tells you it is okay.   Do not have intercourse until your caregiver gives you permission to do so.   Ask your caregiver when you can begin to exercise and what type of exercises to do.   Call your caregiver if you think you are having a problem from your delivery.   Call your pediatrician if you are having a problem with the  baby.   Schedule your postpartum visit and keep it.  SEEK MEDICAL CARE IF:    You have a temperature of 100 F (37.8 C) or higher.   You have increased vaginal bleeding or are passing clots. Save any clots to show your caregiver.   You have bloody urine or pain when you urinate.   You have a bad smelling vaginal discharge.   You have increasing pain or swelling on your episiotomy.   You develop a severe headache.   You feel depressed.   The episiotomy is separating.   You become dizzy or lightheaded.   You develop a rash.   You have a reaction or problems with your medicine.   You have pain, redness, or swelling at the intravenous site.  SEEK IMMEDIATE MEDICAL CARE IF:    You have chest pain.   You develop shortness of breath.   You pass out.   You develop pain, with or without swelling or redness in your leg.   You develop heavy vaginal bleeding, with or without blood clots.   You develop stomach pain.   You develop a bad smelling vaginal discharge.  MAKE SURE YOU:    Understand these instructions.   Will watch your condition.   Will get help right away if you are not doing well or get worse.  Document Released: 10/29/2006 Document Revised: 03/26/2011 Document Reviewed: 11/10/2008  ExitCare Patient Information 2013 [employer].

## 2011-12-31 NOTE — Clinical Social Work Psychosocial (Signed)
Clinical Social Work Department PSYCHOSOCIAL ASSESSMENT - MATERNAL/CHILD 12/31/2011  Patient:  [name],Charlene Atonya  Account Number:  [id number]  Admit Date:  12/28/2011  Childs Name:   Charlene [name]  Charlene [name]    Clinical Social Worker:  Melia Hopes GRIER Ornella Coderre, LCSWA   Date/Time:  12/31/2011 10:43 AM  Date Referred:  12/31/2011      Referred reason  NICU   Other referral source:    I:  FAMILY / HOME ENVIRONMENT Child's legal guardian:  PARENT  Guardian - Name Guardian - Age Guardian - Address  Charlene [name] [address] [place], [place]  Charlene [name]  SAME   Other household support members/support persons Other support:   Pat grandparents live in [place] and will come for one month to assist at time of d/c.    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   Financial resources:  Private Insurance If Medicaid - County:    School / Grade:   Maternity Care Coordinator / Child Services Coordination / Early Interventions:  Cultural issues impacting care:   none noted    III  STRENGTHS Strengths  Adequate Resources  Compliance with medical plan  Home prepared for Child (including basic supplies)  Supportive family/friends  Understanding of illness   Strength comment:    IV  RISK FACTORS AND CURRENT PROBLEMS Current Problem:  None   Risk Factor & Current Problem Patient Issue Family Issue Risk Factor / Current Problem Comment   N N     V  SOCIAL WORK ASSESSMENT CSW met with [name] in her postpartum room to check in due to NICU admit of twins. [name] reports to be coping well. Appeared to be appropriately anxious due to twins in NICU. [name] has experienced 3 previous losses and reports she was able to cope during that time.  [name] denies current anxiety or depression. She had a h/o anxiety noted in her chart. [name] stated she was unaware of that  and has not been on medication for anxiety. She did not know it was an issue and reports to be aware of what to look for regarding pp depression and anxiety. CSW reviewed the s&s of these concerns as well.  [name] reports to have good support to help her and all items in place, just need to get organized. She plans to contact [facility] for follow up peds care. CSW noted no concerns. [name] agrees to contact CSW as needed.      VI SOCIAL WORK PLAN Social Work Plan  Psychosocial Support/Ongoing Assessment of Needs   Type of pt/family education:   If child protective services report - county:   If child protective services report - date:   Information/referral to community resources comment:   Other social work plan:   Continue to follow and assist as needed. [name] plans to visit regularly and has good support. [name] agrees to seek assistance as needed.     Grier Sallyanne Birkhead, LCSW  Coverning NICU for [facility] M-F 8am-12pm  

## 2011-12-31 NOTE — Discharge Summary (Signed)
Attestation of Attending Supervision of Advanced Practitioner (CNM/NP): Evaluation and management procedures were performed by the Advanced Practitioner under my supervision and collaboration.  I have reviewed the Advanced Practitioner's note and chart, and I agree with the management and plan.  HARRAWAY-SMITH, Jaymz Traywick 7:26 PM     

## 2012-01-01 ENCOUNTER — Ambulatory Visit (HOSPITAL_COMMUNITY): Payer: BC Managed Care – PPO

## 2012-01-02 ENCOUNTER — Encounter: Payer: BC Managed Care – PPO | Admitting: Obstetrics & Gynecology

## 2012-01-04 ENCOUNTER — Ambulatory Visit (HOSPITAL_COMMUNITY): Payer: BC Managed Care – PPO

## 2012-01-08 ENCOUNTER — Ambulatory Visit (HOSPITAL_COMMUNITY): Payer: BC Managed Care – PPO

## 2012-01-09 NOTE — MAU Provider Note (Signed)
Attestation of Attending Supervision of Advanced Practitioner: Evaluation and management procedures were performed by the PA/NP/CNM/OB Fellow under my supervision/collaboration. Chart reviewed and agree with management and plan.  Rilda Bulls V 01/09/2012 9:48 AM

## 2012-01-10 ENCOUNTER — Encounter (HOSPITAL_COMMUNITY): Payer: Self-pay | Admitting: *Deleted

## 2012-01-11 ENCOUNTER — Ambulatory Visit (HOSPITAL_COMMUNITY): Payer: BC Managed Care – PPO

## 2012-01-29 ENCOUNTER — Other Ambulatory Visit: Payer: Self-pay | Admitting: Neurology

## 2012-01-29 DIAGNOSIS — G35 Multiple sclerosis: Secondary | ICD-10-CM

## 2012-01-30 NOTE — Progress Notes (Signed)
NST was not done.

## 2012-01-31 ENCOUNTER — Encounter (HOSPITAL_COMMUNITY)
Admission: RE | Admit: 2012-01-31 | Discharge: 2012-01-31 | Disposition: A | Discharge status: Home / Self Care | Payer: BC Managed Care – PPO | Source: Ambulatory Visit | Attending: Obstetrics & Gynecology | Admitting: Obstetrics & Gynecology

## 2012-01-31 DIAGNOSIS — O923 Agalactia: Secondary | ICD-10-CM | POA: Insufficient documentation

## 2012-02-03 ENCOUNTER — Other Ambulatory Visit: Payer: BC Managed Care – PPO

## 2012-02-06 ENCOUNTER — Other Ambulatory Visit: Payer: BC Managed Care – PPO

## 2012-02-07 ENCOUNTER — Ambulatory Visit
Admission: RE | Admit: 2012-02-07 | Discharge: 2012-02-07 | Disposition: A | Discharge status: Home / Self Care | Payer: BC Managed Care – PPO | Source: Ambulatory Visit | Attending: Neurology | Admitting: Neurology

## 2012-02-07 DIAGNOSIS — G35 Multiple sclerosis: Secondary | ICD-10-CM

## 2012-02-12 ENCOUNTER — Ambulatory Visit: Payer: BC Managed Care – PPO | Admitting: Obstetrics & Gynecology

## 2012-02-21 ENCOUNTER — Ambulatory Visit (INDEPENDENT_AMBULATORY_CARE_PROVIDER_SITE_OTHER): Payer: BC Managed Care – PPO | Admitting: Obstetrics & Gynecology

## 2012-02-21 ENCOUNTER — Encounter: Payer: Self-pay | Admitting: Obstetrics & Gynecology

## 2012-02-21 MED ORDER — METOCLOPRAMIDE HCL 10 MG PO TABS: ORAL_TABLET | ORAL | Status: DC

## 2012-02-21 NOTE — Progress Notes (Signed)
  Subjective:    Patient ID: Lenell Mcconnell, female    DOB: [birth year], 32 y.o.   MRN: [id number]  HPI  Mrs. Harbor is now 8 weeks s/p NSVD of twins at 32 weeks and a d&c for retained POC. She is doing well, although tired. She is pumping and giving the babies breast milk in a bottle as the pediatrician recommended. The babies are growing well (now 7 pounds). She is having unprotected sex but not concerned since she had to have IVF to get pregnant with her babies. Her husband is very helpful with the twins. She denies pp depression and declines meds. She declines a flu vaccine per her neurologist's recommendation (She has MS). She would like meds to help with her milk supply.  Review of Systems Pap and cotesting negative 12/12    Objective:   Physical Exam   Normal exam (no tears with delivery)    Assessment & Plan:  Pp- stable RTC 1 year for annual/prn sooner. reglan TID for increasing milk supply

## 2012-10-09 ENCOUNTER — Encounter: Payer: Self-pay | Admitting: Obstetrics & Gynecology

## 2012-10-09 ENCOUNTER — Ambulatory Visit (INDEPENDENT_AMBULATORY_CARE_PROVIDER_SITE_OTHER): Payer: BC Managed Care – PPO | Admitting: Obstetrics & Gynecology

## 2012-10-09 VITALS — BP 124/86 | HR 80 | Resp 16 | Ht 65.0 in | Wt 228.0 lb

## 2012-10-09 DIAGNOSIS — F329 Major depressive disorder, single episode, unspecified: Secondary | ICD-10-CM

## 2012-10-09 DIAGNOSIS — O99345 Other mental disorders complicating the puerperium: Secondary | ICD-10-CM

## 2012-10-09 DIAGNOSIS — F53 Postpartum depression: Secondary | ICD-10-CM | POA: Insufficient documentation

## 2012-10-09 MED ORDER — SERTRALINE HCL 50 MG PO TABS: 50.0000 mg | ORAL_TABLET | Freq: Every day | ORAL | Status: DC

## 2012-10-09 NOTE — Progress Notes (Signed)
  Subjective:   Charlene [name] is an 32 y.o. female who presents for evaluation and treatment of depressive symptoms.  Onset approximately 6 weeks ago, gradually worsening since that time.  Current symptoms include depressed mood, anhedonia, insomnia, impaired memory, loss of energy/fatigue,.  Current treatment for depression:None Sleep problems: Moderate   Early awakening:Absent   Energy: Poor Motivation: Poor Concentration: Fair Rumination/worrying: Mild Memory: Fair Tearfulness: Moderate  Anxiety: Absent  Panic: Absent  Overall Mood: Moderately worse  Hopelessness: Moderate Suicidal ideation: Absent  Other/Psychosocial Stressors: twin delivery 9 months ago Family history positive for depression in the patient's unknown.  Previous treatment modalities employed include None.  Past episodes of depression:none Organic causes of depression present: None.  Review of Systems Pertinent items are noted in HPI.   Objective:   Mental Status Examination: Posture and motor behavior: Appropriate Dress, grooming, personal hygiene: Appropriate Facial expression: Appropriate Speech: Appropriate Mood: Appropriate Coherency and relevance of thought: Appropriate Thought content: Appropriate Perceptions: Appropriate Orientation:Appropriate Attention and concentration: Appropriate Memory: : Appropriate Information: Not done Vocabulary: Appropriate Abstract reasoning: Not examined Judgment: Appropriate    Assessment:   Experiencing the following symptoms of depression most of the day nearly every day for more than two consecutive weeks: depressed mood, loss of interests/pleasure, change in sleep, loss of energy  Depressive Disorder:post partum depression  Suicide Risk Assessment:  No suicidal ideation    Plan:  Post partum depression  Zoloft 50mg  Reviewed concept of depression as biochemical imbalance of neurotransmitters and rationale for treatment. Instructed patient to  contact office or on-call physician promptly should condition worsen or any new symptoms appear and provided on-call telephone numbers.

## 2012-11-18 ENCOUNTER — Ambulatory Visit (INDEPENDENT_AMBULATORY_CARE_PROVIDER_SITE_OTHER): Payer: BC Managed Care – PPO | Admitting: Obstetrics & Gynecology

## 2012-11-18 ENCOUNTER — Encounter: Payer: Self-pay | Admitting: Obstetrics & Gynecology

## 2012-11-18 VITALS — BP 128/84 | HR 86 | Resp 16 | Ht 66.0 in | Wt 237.0 lb

## 2012-11-18 DIAGNOSIS — N979 Female infertility, unspecified: Secondary | ICD-10-CM

## 2012-11-18 DIAGNOSIS — IMO0002 Reserved for concepts with insufficient information to code with codable children: Secondary | ICD-10-CM

## 2012-11-18 MED ORDER — LETROZOLE 2.5 MG PO TABS: 2.5000 mg | ORAL_TABLET | Freq: Every day | ORAL | Status: DC

## 2012-11-18 NOTE — Progress Notes (Signed)
Pt was started on Zoloft for depression.  She developed diarrhea from the medications and stopped the medication.  Pt had diarrhea for several weeks and saw her primary care who could not find any other reason for diarrhea other than Zoloft.  Pt is not having major symptoms of depression.  She owuld like to try Femara. She will do this for 3 months and then go to REI if not conceived.    Reviewed timed intercourse. If cycle is longer than 35 days, would suggest visit to REI sooner. Prenatal vitamins

## 2013-01-15 NOTE — L&D Delivery Note (Signed)
Delivery Note At 12:52 PM after 30 minutes pushing with reassuring heart rate throughout, a viable female was delivered via Vaginal, Spontaneous Delivery (Presentation: Left Occiput Anterior).  APGAR: 8, 9; weight 8#11 Placenta status: By CCT and maternal effort after 30 min (cord blood drained, I&O Foley)  Appears intact, 25% of surface pale and thinning toward edge> sent to pathology.  Cord: 3 vessels with the following complications: Manual removal x 1> large clots  Anesthesia: Epidural  Episiotomy: None Lacerations: None Suture Repair: n/a Est. Blood Loss (mL):  500cc  Mom to postpartum.  Baby to Couplet care / Skin to Skin.  Adryel Wortmann 01/03/2014, 1:41 PM

## 2013-01-27 ENCOUNTER — Telehealth: Payer: Self-pay | Admitting: *Deleted

## 2013-01-27 NOTE — Telephone Encounter (Signed)
Pt called stating that her periods continue to be longer than 35 days even taking the Femara.  Per Dr Bertram Denver note she should go ahead and return to Dr Elesa Hacker for RE treatment.  She does have 5 frozen embryos .

## 2013-02-05 ENCOUNTER — Ambulatory Visit: Payer: BC Managed Care – PPO | Admitting: Obstetrics & Gynecology

## 2013-02-06 ENCOUNTER — Ambulatory Visit: Payer: BC Managed Care – PPO

## 2013-02-12 ENCOUNTER — Ambulatory Visit: Payer: BC Managed Care – PPO | Admitting: Obstetrics & Gynecology

## 2013-02-13 ENCOUNTER — Other Ambulatory Visit: Payer: Self-pay | Admitting: Neurology

## 2013-02-13 DIAGNOSIS — G35 Multiple sclerosis: Secondary | ICD-10-CM

## 2013-02-19 ENCOUNTER — Ambulatory Visit (INDEPENDENT_AMBULATORY_CARE_PROVIDER_SITE_OTHER): Payer: BC Managed Care – PPO | Admitting: Advanced Practice Midwife

## 2013-02-19 ENCOUNTER — Encounter: Payer: Self-pay | Admitting: Advanced Practice Midwife

## 2013-02-19 VITALS — BP 126/81 | HR 78 | Resp 16 | Ht 66.0 in | Wt 251.0 lb

## 2013-02-19 DIAGNOSIS — Z124 Encounter for screening for malignant neoplasm of cervix: Secondary | ICD-10-CM

## 2013-02-19 DIAGNOSIS — Z8659 Personal history of other mental and behavioral disorders: Secondary | ICD-10-CM

## 2013-02-19 DIAGNOSIS — Z1151 Encounter for screening for human papillomavirus (HPV): Secondary | ICD-10-CM

## 2013-02-19 DIAGNOSIS — Z8742 Personal history of other diseases of the female genital tract: Secondary | ICD-10-CM

## 2013-02-19 DIAGNOSIS — Z8759 Personal history of other complications of pregnancy, childbirth and the puerperium: Secondary | ICD-10-CM

## 2013-02-19 DIAGNOSIS — Z01419 Encounter for gynecological examination (general) (routine) without abnormal findings: Secondary | ICD-10-CM

## 2013-02-19 NOTE — Progress Notes (Signed)
Subjective:     Charlene [name] is a 33 y.o. female here for a routine exam.  Current complaints: None.  She reports she exercises/walks 3-4 days/week, eats a moderately healthy diet, always wears her seat belt, and feels safe at home.   She reports she is required to have Pap prior to IVF, which she has scheduled with her fertility specialists.    Gynecologic History Patient's last menstrual period was 02/12/2013. Contraception: none Last Pap: 2012. Results were: normal Last mammogram: N/A.   Obstetric History OB History  Gravida Para Term Preterm AB SAB TAB Ectopic Multiple Living  4 1  1 3 2 1  1 2     # Outcome Date GA Lbr Len/2nd Weight Sex Delivery Anes PTL Lv  4A PRE 12/28/11 [id number] 05:29 / 00:25 4 lb 2.3 oz (1.88 kg) F SVD EPI  Y  4B PRE 12/28/11 1643w0d 05:29 / 01:02 4 lb 9 oz (2.07 kg) F SVD EPI  Y  3 SAB 2010 1084w0d            Comments: D&C  2 TAB 1998          1 SAB                The following portions of the patient's history were reviewed and updated as appropriate: allergies, current medications, past family history, past medical history, past social history, past surgical history and problem list.  Review of Systems A comprehensive review of systems was negative.    Objective:    BP 126/81  Pulse 78  Resp 16  Ht 5\' 6"  (1.676 m)  Wt 113.853 kg (251 lb)  BMI 40.53 kg/m2  LMP 02/12/2013  Breastfeeding? No General appearance: alert, cooperative and no distress Neck: no adenopathy, no carotid bruit, no JVD, supple, symmetrical, trachea midline and thyroid not enlarged, symmetric, no tenderness/mass/nodules Lungs: clear to auscultation bilaterally Breasts: normal appearance, no masses or tenderness Heart: regular rate and rhythm, S1, S2 normal, no murmur, click, rub or gallop Abdomen: soft, non-tender; bowel sounds normal; no masses,  no organomegaly Pelvic: cervix normal in appearance, external genitalia normal, no adnexal masses or tenderness, no cervical motion  tenderness, rectovaginal septum normal, uterus normal size, shape, and consistency and vagina normal without discharge Skin: Skin color, texture, turgor normal. No rashes or lesions    Assessment:    Healthy female exam.    Plan:    Education reviewed: low fat, low cholesterol diet, weight bearing exercise and Iron and Folic acid pre-conception. Contraception: none. Follow up in: 1 year. Pap in 3-5 years if normal results today

## 2013-02-28 ENCOUNTER — Other Ambulatory Visit: Payer: BC Managed Care – PPO

## 2013-03-14 ENCOUNTER — Ambulatory Visit
Admission: RE | Admit: 2013-03-14 | Discharge: 2013-03-14 | Disposition: A | Discharge status: Home / Self Care | Payer: BC Managed Care – PPO | Source: Ambulatory Visit | Attending: Neurology | Admitting: Neurology

## 2013-03-14 DIAGNOSIS — G35 Multiple sclerosis: Secondary | ICD-10-CM

## 2013-03-14 MED ORDER — GADOBENATE DIMEGLUMINE 529 MG/ML IV SOLN
20.0000 mL | Freq: Once | INTRAVENOUS | Status: AC | PRN
  Administered 2013-03-14: 20 mL via INTRAVENOUS

## 2013-06-11 ENCOUNTER — Encounter: Payer: Self-pay | Admitting: Advanced Practice Midwife

## 2013-06-11 ENCOUNTER — Ambulatory Visit (INDEPENDENT_AMBULATORY_CARE_PROVIDER_SITE_OTHER): Payer: BC Managed Care – PPO | Admitting: Advanced Practice Midwife

## 2013-06-11 VITALS — BP 135/92 | HR 90 | Wt 257.0 lb

## 2013-06-11 DIAGNOSIS — I10 Essential (primary) hypertension: Secondary | ICD-10-CM

## 2013-06-11 DIAGNOSIS — Z3480 Encounter for supervision of other normal pregnancy, unspecified trimester: Secondary | ICD-10-CM

## 2013-06-11 DIAGNOSIS — O09211 Supervision of pregnancy with history of pre-term labor, first trimester: Secondary | ICD-10-CM

## 2013-06-11 DIAGNOSIS — G35 Multiple sclerosis: Secondary | ICD-10-CM

## 2013-06-11 DIAGNOSIS — O09891 Supervision of other high risk pregnancies, first trimester: Secondary | ICD-10-CM

## 2013-06-11 DIAGNOSIS — O10919 Unspecified pre-existing hypertension complicating pregnancy, unspecified trimester: Secondary | ICD-10-CM

## 2013-06-11 DIAGNOSIS — O10019 Pre-existing essential hypertension complicating pregnancy, unspecified trimester: Secondary | ICD-10-CM

## 2013-06-11 DIAGNOSIS — O09219 Supervision of pregnancy with history of pre-term labor, unspecified trimester: Secondary | ICD-10-CM

## 2013-06-11 LAB — OB RESULTS CONSOLE GC/CHLAMYDIA
Chlamydia: NEGATIVE
GC PROBE AMP, GENITAL: NEGATIVE

## 2013-06-11 LAB — OB RESULTS CONSOLE GBS: STREP GROUP B AG: POSITIVE

## 2013-06-11 NOTE — Patient Instructions (Signed)
Pregnancy - First Trimester  During sexual intercourse, millions of sperm go into the vagina. Only 1 sperm will penetrate and fertilize the female egg while it is in the Fallopian tube. One week later, the fertilized egg implants into the wall of the uterus. An embryo begins to develop into a baby. At 6 to 8 weeks, the eyes and face are formed and the heartbeat can be seen on ultrasound. At the end of 12 weeks (first trimester), all the baby's organs are formed. Now that you are pregnant, you will want to do everything you can to have a healthy baby. Two of the most important things are to get good prenatal care and follow your caregiver's instructions. Prenatal care is all the medical care you receive before the baby's birth. It is given to prevent, find, and treat problems during the pregnancy and childbirth.  PRENATAL EXAMS  · During prenatal visits, your weight, blood pressure, and urine are checked. This is done to make sure you are healthy and progressing normally during the pregnancy.  · A pregnant woman should gain 25 to 35 pounds during the pregnancy. However, if you are overweight or underweight, your caregiver will advise you regarding your weight.  · Your caregiver will ask and answer questions for you.  · Blood work, cervical cultures, other necessary tests, and a Pap test are done during your prenatal exams. These tests are done to check on your health and the probable health of your baby. Tests are strongly recommended and done for HIV with your permission. This is the virus that causes AIDS. These tests are done because medicines can be given to help prevent your baby from being born with this infection should you have been infected without knowing it. Blood work is also used to find out your blood type, previous infections, and follow your blood levels (hemoglobin).  · Low hemoglobin (anemia) is common during pregnancy. Iron and vitamins are given to help prevent this. Later in the pregnancy, blood  tests for diabetes will be done along with any other tests if any problems develop.  · You may need other tests to make sure you and the baby are doing well.  CHANGES DURING THE FIRST TRIMESTER   Your body goes through many changes during pregnancy. They vary from person to person. Talk to your caregiver about changes you notice and are concerned about. Changes can include:  · Your menstrual period stops.  · The egg and sperm carry the genes that determine what you look like. Genes from you and your partner are forming a baby. The female genes determine whether the baby is a boy or a girl.  · Your body increases in girth and you may feel bloated.  · Feeling sick to your stomach (nauseous) and throwing up (vomiting). If the vomiting is uncontrollable, call your caregiver.  · Your breasts will begin to enlarge and become tender.  · Your nipples may stick out more and become darker.  · The need to urinate more. Painful urination may mean you have a bladder infection.  · Tiring easily.  · Loss of appetite.  · Cravings for certain kinds of food.  · At first, you may gain or lose a couple of pounds.  · You may have changes in your emotions from day to day (excited to be pregnant or concerned something may go wrong with the pregnancy and baby).  · You may have more vivid and strange dreams.  HOME CARE INSTRUCTIONS   ·   It is very important to avoid all smoking, alcohol and non-prescribed drugs during your pregnancy. These affect the formation and growth of the baby. Avoid chemicals while pregnant to ensure the delivery of a healthy infant.  · Start your prenatal visits by the 12th week of pregnancy. They are usually scheduled monthly at first, then more often in the last 2 months before delivery. Keep your caregiver's appointments. Follow your caregiver's instructions regarding medicine use, blood and lab tests, exercise, and diet.  · During pregnancy, you are providing food for you and your baby. Eat regular, well-balanced  meals. Choose foods such as meat, fish, milk and other low fat dairy products, vegetables, fruits, and whole-grain breads and cereals. Your caregiver will tell you of the ideal weight gain.  · You can help morning sickness by keeping soda crackers at the bedside. Eat a couple before arising in the morning. You may want to use the crackers without salt on them.  · Eating 4 to 5 small meals rather than 3 large meals a day also may help the nausea and vomiting.  · Drinking liquids between meals instead of during meals also seems to help nausea and vomiting.  · A physical sexual relationship may be continued throughout pregnancy if there are no other problems. Problems may be early (premature) leaking of amniotic fluid from the membranes, vaginal bleeding, or belly (abdominal) pain.  · Exercise regularly if there are no restrictions. Check with your caregiver or physical therapist if you are unsure of the safety of some of your exercises. Greater weight gain will occur in the last 2 trimesters of pregnancy. Exercising will help:  · Control your weight.  · Keep you in shape.  · Prepare you for labor and delivery.  · Help you lose your pregnancy weight after you deliver your baby.  · Wear a good support or jogging bra for breast tenderness during pregnancy. This may help if worn during sleep too.  · Ask when prenatal classes are available. Begin classes when they are offered.  · Do not use hot tubs, steam rooms, or saunas.  · Wear your seat belt when driving. This protects you and your baby if you are in an accident.  · Avoid raw meat, uncooked cheese, cat litter boxes, and soil used by cats throughout the pregnancy. These carry germs that can cause birth defects in the baby.  · The first trimester is a good time to visit your dentist for your dental health. Getting your teeth cleaned is okay. Use a softer toothbrush and brush gently during pregnancy.  · Ask for help if you have financial, counseling, or nutritional needs  during pregnancy. Your caregiver will be able to offer counseling for these needs as well as refer you for other special needs.  · Do not take any medicines or herbs unless told by your caregiver.  · Inform your caregiver if there is any mental or physical domestic violence.  · Make a list of emergency phone numbers of family, friends, hospital, and police and fire departments.  · Write down your questions. Take them to your prenatal visit.  · Do not douche.  · Do not cross your legs.  · If you have to stand for long periods of time, rotate you feet or take small steps in a circle.  · You may have more vaginal secretions that may require a sanitary pad. Do not use tampons or scented sanitary pads.  MEDICINES AND DRUG USE IN PREGNANCY  ·   Take prenatal vitamins as directed. The vitamin should contain 1 milligram of folic acid. Keep all vitamins out of reach of children. Only a couple vitamins or tablets containing iron may be fatal to a baby or young child when ingested.  · Avoid use of all medicines, including herbs, over-the-counter medicines, not prescribed or suggested by your caregiver. Only take over-the-counter or prescription medicines for pain, discomfort, or fever as directed by your caregiver. Do not use aspirin, ibuprofen, or naproxen unless directed by your caregiver.  · Let your caregiver also know about herbs you may be using.  · Alcohol is related to a number of birth defects. This includes fetal alcohol syndrome. All alcohol, in any form, should be avoided completely. Smoking will cause low birth rate and premature babies.  · Street or illegal drugs are very harmful to the baby. They are absolutely forbidden. A baby born to an addicted mother will be addicted at birth. The baby will go through the same withdrawal an adult does.  · Let your caregiver know about any medicines that you have to take and for what reason you take them.  SEEK MEDICAL CARE IF:   You have any concerns or worries during your  pregnancy. It is better to call with your questions if you feel they cannot wait, rather than worry about them.  SEEK IMMEDIATE MEDICAL CARE IF:   · An unexplained oral temperature above 102° F (38.9° C) develops, or as your caregiver suggests.  · You have leaking of fluid from the vagina (birth canal). If leaking membranes are suspected, take your temperature and inform your caregiver of this when you call.  · There is vaginal spotting or bleeding. Notify your caregiver of the amount and how many pads are used.  · You develop a bad smelling vaginal discharge with a change in the color.  · You continue to feel sick to your stomach (nauseated) and have no relief from remedies suggested. You vomit blood or coffee ground-like materials.  · You lose more than 2 pounds of weight in 1 week.  · You gain more than 2 pounds of weight in 1 week and you notice swelling of your face, hands, feet, or legs.  · You gain 5 pounds or more in 1 week (even if you do not have swelling of your hands, face, legs, or feet).  · You get exposed to German measles and have never had them.  · You are exposed to fifth disease or chickenpox.  · You develop belly (abdominal) pain. Round ligament discomfort is a common non-cancerous (benign) cause of abdominal pain in pregnancy. Your caregiver still must evaluate this.  · You develop headache, fever, diarrhea, pain with urination, or shortness of breath.  · You fall or are in a car accident or have any kind of trauma.  · There is mental or physical violence in your home.  Document Released: 12/26/2000 Document Revised: 09/26/2011 Document Reviewed: 06/29/2008  [employer]® Patient Information ©2014 [employer].

## 2013-06-11 NOTE — Progress Notes (Signed)
Pt here for initial prenatal visit after successful IVF transfer by Dr. Elesa Hackereaton. LMP unknown [facility] Date of Conception 04/30/13 - Beside u/s shows FHR 160bmp Pt unable to do blood labs today - gave lab requisition to patient to come back at her convenience

## 2013-06-11 NOTE — Progress Notes (Signed)
New OB. See Smartset note   Subjective:    Shakirra Rem is a [id number] [redacted]w[redacted]d being seen today for her first obstetrical visit.  Her obstetrical history is significant for obesity and History of preterm birth, hx preeclampsia and MS. Patient does intend to breast feed. Pregnancy history fully reviewed.  Patient reports no complaints.  Filed Vitals:   06/11/13 1510  BP: 135/92  Pulse: 90  Weight: 116.574 kg (257 lb)    HISTORY: OB History  Gravida Para Term Preterm AB SAB TAB Ectopic Multiple Living  5 1  1 3 2 1  1 2     # Outcome Date GA Lbr Len/2nd Weight Sex Delivery Anes PTL Lv  5 CUR           4A PRE 12/28/11 [redacted]w[redacted]d 05:29 / 00:25 1.88 kg (4 lb 2.3 oz) F SVD EPI  Y  4B PRE 12/28/11 [redacted]w[redacted]d 05:29 / 01:02 2.07 kg (4 lb 9 oz) F SVD EPI  Y  3 SAB 2010 [redacted]w[redacted]d            Comments: D&C  2 TAB 1998          1 SAB              Past Medical History  Diagnosis Date  . MS (multiple sclerosis)   . Hyperlipidemia   . GERD (gastroesophageal reflux disease)   . Psoriasis   . Infertility associated with anovulation   . Anxiety   . Pregnancy induced hypertension   . Migraines    Past Surgical History  Procedure Laterality Date  . Dilation and curettage of uterus  8/10  . Dilation and evacuation  12/29/2011    Procedure: DILATATION AND EVACUATION;  Surgeon: Catalina Antigua, MD;  Location: [facility];  Service: Gynecology;;  Dr, Emelda Fear transferred case to Dr. Jolayne Panther   Family History  Problem Relation Age of Onset  . Heart attack Maternal Grandfather   . Cancer Maternal Grandmother     esophagus     Exam    Uterus:  Fundal Height: 8 cm  Pelvic Exam:    Perineum: Deferred per pt request   Vulva: deferred   Vagina:  deferred   pH:    Cervix: deferred   Adnexa: not evaluated   Bony Pelvis: gynecoid  System: Breast:  normal appearance, no masses or tenderness   Skin: normal coloration and turgor, no rashes    Neurologic: oriented, grossly non-focal   Extremities: normal  strength, tone, and muscle mass   HEENT neck supple with midline trachea   Mouth/Teeth mucous membranes moist, pharynx normal without lesions   Neck supple and no masses   Cardiovascular: regular rate and rhythm, no murmurs or gallops   Respiratory:  appears well, vitals normal, no respiratory distress, acyanotic, normal RR, ear and throat exam is normal, neck free of mass or lymphadenopathy, chest clear, no wheezing, crepitations, rhonchi, normal symmetric air entry   Abdomen: soft, non-tender; bowel sounds normal; no masses,  no organomegaly   Urinary: deferred     Bedside US done to confirm FHR and dates Dates = size FHR 160  Assessment:    Pregnancy: [id number] Patient Active Problem List   Diagnosis Date Noted  . History of preterm delivery, currently pregnant in first trimester 06/11/2013  . MS (multiple sclerosis) 06/11/2013  . History of postpartum depression 02/21/2013  . 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 MIGRAINE HEADACHE 03/22/2009  . Adjustment disorder with anxiety  01/05/2008        Plan:     Initial labs drawn. Prenatal vitamins. Problem list reviewed and updated. Genetic Screening discussed First Screen: ordered.  Ultrasound discussed; fetal survey: ordered.  Follow up in 4 weeks. 50% of 30 min visit spent on counseling and coordination of care.   Will order First trimester screen for 11 weeks Wants to meet with MFM for recommendations. [facility] Will order 24 hr urine and baseline labs with PN labs Pt wishes to wait for another day for labs. Has orders to come to lab     Aviva SignsMarie L Anuoluwapo Mefferd 06/11/2013

## 2013-06-12 LAB — GC/CHLAMYDIA PROBE AMP
CT Probe RNA: NEGATIVE
GC Probe RNA: NEGATIVE

## 2013-06-13 LAB — CULTURE, OB URINE

## 2013-06-14 ENCOUNTER — Encounter: Payer: Self-pay | Admitting: Advanced Practice Midwife

## 2013-06-14 DIAGNOSIS — R8271 Bacteriuria: Secondary | ICD-10-CM | POA: Insufficient documentation

## 2013-06-15 ENCOUNTER — Telehealth: Payer: Self-pay | Admitting: *Deleted

## 2013-06-15 DIAGNOSIS — B951 Streptococcus, group B, as the cause of diseases classified elsewhere: Secondary | ICD-10-CM

## 2013-06-15 DIAGNOSIS — O2340 Unspecified infection of urinary tract in pregnancy, unspecified trimester: Principal | ICD-10-CM

## 2013-06-15 MED ORDER — PENICILLIN V POTASSIUM 500 MG PO TABS: 500.0000 mg | ORAL_TABLET | Freq: Four times a day (QID) | ORAL | Status: DC

## 2013-06-15 NOTE — Telephone Encounter (Signed)
Message copied by Arne Cleveland on Mon Jun 15, 2013  8:58 AM ------      Message from: Aviva Signs      Created: Sun Jun 14, 2013  2:37 PM      Regarding: gbs uti       Need to treat with PCN (Pen VK) 500mg  qid x 7d            Can you call in?      My eprescribe does not work            Wynelle Bourgeois ------

## 2013-06-15 NOTE — Telephone Encounter (Signed)
LMOM for pt to pick up script at pharm for UTI

## 2013-06-16 ENCOUNTER — Encounter: Payer: Self-pay | Admitting: *Deleted

## 2013-06-16 LAB — COMPREHENSIVE METABOLIC PANEL
ALBUMIN: 3.7 g/dL (ref 3.5–5.2)
ALK PHOS: 46 U/L (ref 39–117)
ALT: 12 U/L (ref 0–35)
AST: 16 U/L (ref 0–37)
BUN: 8 mg/dL (ref 6–23)
CO2: 24 mEq/L (ref 19–32)
Calcium: 9.5 mg/dL (ref 8.4–10.5)
Chloride: 103 mEq/L (ref 96–112)
Creat: 0.54 mg/dL (ref 0.50–1.10)
Glucose, Bld: 84 mg/dL (ref 70–99)
POTASSIUM: 4.1 meq/L (ref 3.5–5.3)
SODIUM: 137 meq/L (ref 135–145)
Total Bilirubin: 0.3 mg/dL (ref 0.2–1.2)
Total Protein: 6.8 g/dL (ref 6.0–8.3)

## 2013-06-16 LAB — CREATININE CLEARANCE, URINE, 24 HOUR
Creatinine Clearance: 270 mL/min — ABNORMAL HIGH (ref 75–115)
Creatinine, 24H Ur: 2096 mg/d — ABNORMAL HIGH (ref 700–1800)
Creatinine, Urine: 104.8 mg/dL
Creatinine: 0.54 mg/dL (ref 0.50–1.10)

## 2013-06-16 LAB — PROTEIN, URINE, 24 HOUR
PROTEIN 24H UR: 660 mg/d — AB (ref 50–100)
PROTEIN, URINE: 33 mg/dL

## 2013-06-17 ENCOUNTER — Other Ambulatory Visit: Payer: Self-pay | Admitting: Obstetrics & Gynecology

## 2013-06-17 ENCOUNTER — Encounter: Payer: Self-pay | Admitting: Obstetrics & Gynecology

## 2013-06-17 ENCOUNTER — Telehealth: Payer: Self-pay | Admitting: *Deleted

## 2013-06-17 DIAGNOSIS — O1211 Gestational proteinuria, first trimester: Secondary | ICD-10-CM

## 2013-06-17 DIAGNOSIS — Z3682 Encounter for antenatal screening for nuchal translucency: Secondary | ICD-10-CM

## 2013-06-17 DIAGNOSIS — R809 Proteinuria, unspecified: Secondary | ICD-10-CM

## 2013-06-17 LAB — OBSTETRIC PANEL
Antibody Screen: NEGATIVE
BASOS ABS: 0 10*3/uL (ref 0.0–0.1)
BASOS PCT: 0 % (ref 0–1)
Eosinophils Absolute: 0.1 10*3/uL (ref 0.0–0.7)
Eosinophils Relative: 1 % (ref 0–5)
HEMATOCRIT: 38.9 % (ref 36.0–46.0)
HEP B S AG: NEGATIVE
Hemoglobin: 13.9 g/dL (ref 12.0–15.0)
LYMPHS PCT: 22 % (ref 12–46)
Lymphs Abs: 1.7 10*3/uL (ref 0.7–4.0)
MCH: 31.9 pg (ref 26.0–34.0)
MCHC: 35.7 g/dL (ref 30.0–36.0)
MCV: 89.2 fL (ref 78.0–100.0)
Monocytes Absolute: 0.5 10*3/uL (ref 0.1–1.0)
Monocytes Relative: 6 % (ref 3–12)
NEUTROS ABS: 5.6 10*3/uL (ref 1.7–7.7)
NEUTROS PCT: 71 % (ref 43–77)
Platelets: 270 10*3/uL (ref 150–400)
RBC: 4.36 MIL/uL (ref 3.87–5.11)
RDW: 13.1 % (ref 11.5–15.5)
RUBELLA: 6.49 {index} — AB (ref <0.90)
Rh Type: POSITIVE
WBC: 7.9 10*3/uL (ref 4.0–10.5)

## 2013-06-17 LAB — HIV ANTIBODY (ROUTINE TESTING W REFLEX): HIV: NONREACTIVE

## 2013-06-17 NOTE — Telephone Encounter (Signed)
Called pt to adv labs WNL except for proteinuria and will refer to MFM for co-mgmt - pt adv already has appt schedule with MFM for 1st trimester screening. Pt expressed understanding.

## 2013-06-23 ENCOUNTER — Telehealth: Payer: Self-pay | Admitting: *Deleted

## 2013-06-23 NOTE — Telephone Encounter (Signed)
Pt called with c/o's dizziness.  She denies having started any new medications, she had her BP checked yesterday and per pt it was fine.  She states she is drinking at least 64 oz of fluids daily.  She states that she feels she is in a wave.  Recommended PCP or Urgent care to check for inner ear/ vertigo.  Pt states she will go as soon as her husband gets home from work.

## 2013-06-24 ENCOUNTER — Telehealth: Payer: Self-pay | Admitting: *Deleted

## 2013-06-24 ENCOUNTER — Encounter: Payer: BC Managed Care – PPO | Admitting: Obstetrics & Gynecology

## 2013-06-24 DIAGNOSIS — R42 Dizziness and giddiness: Secondary | ICD-10-CM

## 2013-06-24 MED ORDER — MECLIZINE HCL 50 MG PO TABS: 50.0000 mg | ORAL_TABLET | Freq: Three times a day (TID) | ORAL | Status: DC | PRN

## 2013-06-24 NOTE — Telephone Encounter (Signed)
Pt called stating that she went to UC last night for dizziness.  They ruled out  Inner ear, UTI and BP.  Pt is still having the dizziness and this is not worsened with exertion.  Spoke with Dr Penne Lash and she recommended trying Antivert and if no improvement may refer to ENT.

## 2013-07-09 ENCOUNTER — Ambulatory Visit (INDEPENDENT_AMBULATORY_CARE_PROVIDER_SITE_OTHER): Payer: BC Managed Care – PPO | Admitting: Obstetrics & Gynecology

## 2013-07-09 VITALS — BP 127/77 | HR 91 | Wt 258.0 lb

## 2013-07-09 DIAGNOSIS — Z3480 Encounter for supervision of other normal pregnancy, unspecified trimester: Secondary | ICD-10-CM

## 2013-07-09 DIAGNOSIS — O26839 Pregnancy related renal disease, unspecified trimester: Secondary | ICD-10-CM

## 2013-07-09 DIAGNOSIS — O1211 Gestational proteinuria, first trimester: Secondary | ICD-10-CM

## 2013-07-09 DIAGNOSIS — R809 Proteinuria, unspecified: Secondary | ICD-10-CM

## 2013-07-09 NOTE — Progress Notes (Signed)
Spotting today after intercourse

## 2013-07-10 ENCOUNTER — Telehealth: Payer: Self-pay | Admitting: *Deleted

## 2013-07-10 NOTE — Progress Notes (Signed)
Pt having increased MS symptoms.  Neurologist to call me and discuss treatment initiation (Copaxonne--Category B).  Discussed with MFm and pharmacy who concur that medication is safe. Baseline urine has 660mg  protein.  Discussed f/u with nephrologist post partum as well as co management with MFM.  Pt developed preeclampsiaaround 30 weeks last pregnancy and is at risk again.  Would start baby asa for preeclampsia prevention.  17P at 16 weeks.

## 2013-07-10 NOTE — Telephone Encounter (Signed)
Spoke with [facility] pharmacy about Copaxone.  The drug is a cat B and safe in pregnancy for MS.  Pt is currently on ASA 81 mg and will stay on that until further notice.  She is scheduled with MFM on 07/14/13 to coordinate care.

## 2013-07-14 ENCOUNTER — Ambulatory Visit (HOSPITAL_COMMUNITY)
Admission: RE | Admit: 2013-07-14 | Discharge: 2013-07-14 | Disposition: A | Discharge status: Home / Self Care | Payer: BC Managed Care – PPO | Source: Ambulatory Visit | Attending: Advanced Practice Midwife | Admitting: Advanced Practice Midwife

## 2013-07-14 ENCOUNTER — Ambulatory Visit (HOSPITAL_COMMUNITY)
Admission: RE | Admit: 2013-07-14 | Discharge: 2013-07-14 | Disposition: A | Discharge status: Home / Self Care | Payer: BC Managed Care – PPO | Source: Ambulatory Visit | Attending: Obstetrics & Gynecology | Admitting: Obstetrics & Gynecology

## 2013-07-14 ENCOUNTER — Other Ambulatory Visit: Payer: Self-pay

## 2013-07-14 DIAGNOSIS — O9989 Other specified diseases and conditions complicating pregnancy, childbirth and the puerperium: Principal | ICD-10-CM

## 2013-07-14 DIAGNOSIS — Z36 Encounter for antenatal screening of mother: Secondary | ICD-10-CM | POA: Insufficient documentation

## 2013-07-14 DIAGNOSIS — E785 Hyperlipidemia, unspecified: Secondary | ICD-10-CM | POA: Insufficient documentation

## 2013-07-14 DIAGNOSIS — Z3689 Encounter for other specified antenatal screening: Secondary | ICD-10-CM

## 2013-07-14 DIAGNOSIS — Z3682 Encounter for antenatal screening for nuchal translucency: Secondary | ICD-10-CM

## 2013-07-14 DIAGNOSIS — O09891 Supervision of other high risk pregnancies, first trimester: Secondary | ICD-10-CM

## 2013-07-14 DIAGNOSIS — O99891 Other specified diseases and conditions complicating pregnancy: Secondary | ICD-10-CM | POA: Insufficient documentation

## 2013-07-14 DIAGNOSIS — G35 Multiple sclerosis: Secondary | ICD-10-CM | POA: Insufficient documentation

## 2013-07-14 DIAGNOSIS — L408 Other psoriasis: Secondary | ICD-10-CM | POA: Insufficient documentation

## 2013-07-14 DIAGNOSIS — O09211 Supervision of pregnancy with history of pre-term labor, first trimester: Secondary | ICD-10-CM

## 2013-07-14 DIAGNOSIS — K219 Gastro-esophageal reflux disease without esophagitis: Secondary | ICD-10-CM | POA: Insufficient documentation

## 2013-07-14 DIAGNOSIS — Z87891 Personal history of nicotine dependence: Secondary | ICD-10-CM | POA: Insufficient documentation

## 2013-07-14 NOTE — Progress Notes (Signed)
MATERNAL FETAL MEDICINE CONSULT  Patient Name: Farrin Azzarello Medical Record Number:  [id number] Date of Birth: 1980/10/29 Requesting Physician Name:  Lesly Dukes, MD Date of Service: 07/14/2013  Chief Complaint Multiple Sclerosis in pregnancy  History of Present Illness Lolah Tames was seen today secondary to multiple sclerosis in pregnancy at the request of Lesly Dukes, MD.  The patient is a 33 y.o. [id number],QU [redacted]w[redacted]d with an EDD of 01/21/2014, by IVF dating.  This pregnancy was conceived via IVF with donor oocytes from a 33 year old donor.  She was first diagnosed with MS in 2006 when she presented with symptoms of cognitive difficulty and hand numbness.  She has been under the care of a neurologist since.  In the weeks leading up to her IVF cycle she was experiencing worsening of these symptoms.  They have improved since conception, but still interfere with Ms. Virella daily life.  Her neurologist has prescribed Copaxone, which worked well for her in the past.  Ms. Panetta has a history of 3 prior SAB's and a twin pregnancy, which was in 2013.  In that pregnancy she developed preeclampsia at 30 weeks and had a spontaneous preterm birth at 60 weeks.  Review of Systems Pertinent items are noted in HPI.  Patient History OB History  Gravida Para Term Preterm AB SAB TAB Ectopic Multiple Living  5 1  1 3 2 1  1 2     # Outcome Date GA Lbr Len/2nd Weight Sex Delivery Anes PTL Lv  5 CUR           4A PRE 12/28/11 [redacted]w[redacted]d 05:29 / 00:25 4 lb 2.3 oz (1.88 kg) F SVD EPI  Y  4B PRE 12/28/11 109w0d 05:29 / 01:02 4 lb 9 oz (2.07 kg) F SVD EPI  Y  3 SAB 2010 [redacted]w[redacted]d            Comments: D&C  2 TAB 1998          1 SAB               Past Medical History  Diagnosis Date  . MS (multiple sclerosis)   . Hyperlipidemia   . GERD (gastroesophageal reflux disease)   . Psoriasis   . Infertility associated with anovulation   . Anxiety   . Pregnancy induced hypertension   . Migraines     Past Surgical  History  Procedure Laterality Date  . Dilation and curettage of uterus  8/10  . Dilation and evacuation  12/29/2011    Procedure: DILATATION AND EVACUATION;  Surgeon: Catalina Antigua, MD;  Location: [facility];  Service: Gynecology;;  Dr, Emelda Fear transferred case to Dr. Jolayne Panther    History   Social History  . Marital Status: Married    Spouse Name: N/A    Number of Children: N/A  . Years of Education: N/A   Occupational History  . office manager    Social History Main Topics  . Smoking status: Former Smoker -- 1.00 packs/day for 15 years    Types: Cigarettes    Quit date: 07/02/2010  . Smokeless tobacco: Never Used  . Alcohol Use: No     Comment: occassion - not while pregnant  . Drug Use: No  . Sexual Activity: Yes    Partners: Male   Other Topics Concern  . Not on file   Social History Narrative  . No narrative on file    Family History  Problem Relation Age of Onset  . Heart attack  Maternal Grandfather   . Cancer Maternal Grandmother     esophagus   In addition, the patient has no family history of mental retardation, birth defects, or genetic diseases.  Physical Examination Vitals - BP 138/75, Pulse 87, Weight 256 lbs.  Assessment and Recommendations 1.  Multiple Sclerosis in pregnancy.  I discussed with Ms. Effie ShyColeman and her husband, who accompanied her to the appointment today, that typically MS symptoms improve in pregnancy with an increased risk of a flare in the first 6 months after delivery.  Ms. Effie ShyColeman would still like to start taking the Copaxone, as her symptoms are still very bothersome.  It is a pregnancy category B medication, thus safe in pregnancy.  As Ms. Effie ShyColeman has only mild to moderate symptoms at this time and on functional impairment, I do not expect her MS to adversely effect the pregnancy.  However, she should still see her neurologist regularly throughout pregnancy especially if the symptoms she is currently experiencing persist. 2.  Prior  preterm birth.  Ms. Effie ShyColeman was very concerned about her risk of recurrent preterm birth.  Her recurrence risk is difficult to estimate as her prior preterm birth was in the setting of a twin gestation, a factor that is known to increase the risk of preterm birth.  Given the uncertainty I do not feel as though a history indicated cerclage is appropriate for Ms. Hollister.  However, as I cannot rule out an increased risk I recommend she start weekly IM 17-OH progesterone supplementation and every other week transvaginal cervical length measurements.  Should her cervix shorten to less than 2.5 cm prior to 23 weeks a cerclage could be considered.  [facility] transvaginal ultrasound.  She will determine if the 17-OH progesterone injections can be given in her primary MD's office. 3.  Prior preeclampsia.  As with her prior preterm birth it is difficult to assess her risk of recurrent preeclampsia as twin are also associated with an increased risk of preeclampsia.  There are far fewer effective treatments for the prevention of recurrent preeclampsia than there are for preterm birth.  She is already taking a baby aspirin which is the only appropriate intervention available to decrease there recurrence of preeclampsia. 4.  Genetic Screening.  Ms. Effie ShyColeman had a first trimester screen today.  The NT was 1.9 mm, which is normal for the fetal CRL.  She is not a candidate for cell-free fetal DNA testing as her risk of aneuploidy is low considering the dono oocytes used were from a women at age 33.  She is scheduled for a detailed fetal anatomic survey in 6 weeks.  I spent 30 minutes with Ms. Effie ShyColeman today of which 50% was face-to-face counseling.  [facility].  Please do not hesitate to contact us with questions.   Rema FendtNITSCHE,Megumi Treaster, MD

## 2013-08-04 ENCOUNTER — Ambulatory Visit (INDEPENDENT_AMBULATORY_CARE_PROVIDER_SITE_OTHER): Payer: BC Managed Care – PPO | Admitting: Obstetrics & Gynecology

## 2013-08-04 VITALS — BP 126/76 | HR 92

## 2013-08-04 DIAGNOSIS — O09219 Supervision of pregnancy with history of pre-term labor, unspecified trimester: Secondary | ICD-10-CM

## 2013-08-04 DIAGNOSIS — O0992 Supervision of high risk pregnancy, unspecified, second trimester: Secondary | ICD-10-CM

## 2013-08-04 DIAGNOSIS — Z3480 Encounter for supervision of other normal pregnancy, unspecified trimester: Secondary | ICD-10-CM

## 2013-08-04 MED ORDER — COMPLETENATE 29-1 MG PO CHEW: 1.0000 | CHEWABLE_TABLET | Freq: Every day | ORAL | Status: DC

## 2013-08-04 NOTE — Progress Notes (Signed)
Pt having bursts of light in vision.  Suggest check up with opthalmologist. Pt not started MS meds yet.  She is planning on starting this week. Pt agrees to 17P and will need to start at 16 weeks.  RN Chestine Spore to arrange. BP nml this visit.  Will watch closely.  Continue baby asa First screen negative.  AFP today.  Anatomy US at 19 weeks.

## 2013-08-05 LAB — ALPHA FETOPROTEIN, MATERNAL
AFP: 23 [IU]/mL
Curr Gest Age: 15.5 wks.days
MOM FOR AFP: 1.17
OPEN SPINA BIFIDA: NEGATIVE

## 2013-08-06 ENCOUNTER — Encounter: Payer: Self-pay | Admitting: Obstetrics & Gynecology

## 2013-08-06 ENCOUNTER — Ambulatory Visit (INDEPENDENT_AMBULATORY_CARE_PROVIDER_SITE_OTHER): Payer: BC Managed Care – PPO | Admitting: *Deleted

## 2013-08-06 DIAGNOSIS — Z8751 Personal history of pre-term labor: Secondary | ICD-10-CM

## 2013-08-06 DIAGNOSIS — O09219 Supervision of pregnancy with history of pre-term labor, unspecified trimester: Secondary | ICD-10-CM

## 2013-08-06 MED ORDER — HYDROXYPROGESTERONE CAPROATE 250 MG/ML IM OIL
250.0000 mg | TOPICAL_OIL | INTRAMUSCULAR | Status: AC
  Administered 2013-08-06 – 2013-12-14 (×7): 250 mg via INTRAMUSCULAR

## 2013-08-11 ENCOUNTER — Ambulatory Visit (HOSPITAL_COMMUNITY)
Admission: RE | Admit: 2013-08-11 | Discharge: 2013-08-11 | Disposition: A | Discharge status: Home / Self Care | Payer: BC Managed Care – PPO | Source: Ambulatory Visit | Attending: Obstetrics & Gynecology | Admitting: Obstetrics & Gynecology

## 2013-08-11 DIAGNOSIS — O09219 Supervision of pregnancy with history of pre-term labor, unspecified trimester: Secondary | ICD-10-CM | POA: Insufficient documentation

## 2013-08-11 DIAGNOSIS — Z3689 Encounter for other specified antenatal screening: Secondary | ICD-10-CM | POA: Insufficient documentation

## 2013-08-11 DIAGNOSIS — O09891 Supervision of other high risk pregnancies, first trimester: Secondary | ICD-10-CM

## 2013-08-11 DIAGNOSIS — O09211 Supervision of pregnancy with history of pre-term labor, first trimester: Secondary | ICD-10-CM

## 2013-08-13 ENCOUNTER — Ambulatory Visit (INDEPENDENT_AMBULATORY_CARE_PROVIDER_SITE_OTHER): Payer: BC Managed Care – PPO | Admitting: *Deleted

## 2013-08-13 DIAGNOSIS — O09219 Supervision of pregnancy with history of pre-term labor, unspecified trimester: Secondary | ICD-10-CM | POA: Diagnosis not present

## 2013-08-13 DIAGNOSIS — Z8751 Personal history of pre-term labor: Secondary | ICD-10-CM

## 2013-08-20 ENCOUNTER — Ambulatory Visit (INDEPENDENT_AMBULATORY_CARE_PROVIDER_SITE_OTHER): Payer: BC Managed Care – PPO | Admitting: *Deleted

## 2013-08-20 DIAGNOSIS — O09219 Supervision of pregnancy with history of pre-term labor, unspecified trimester: Secondary | ICD-10-CM

## 2013-08-20 DIAGNOSIS — Z8751 Personal history of pre-term labor: Secondary | ICD-10-CM

## 2013-08-25 ENCOUNTER — Ambulatory Visit (INDEPENDENT_AMBULATORY_CARE_PROVIDER_SITE_OTHER): Payer: BC Managed Care – PPO | Admitting: Obstetrics & Gynecology

## 2013-08-25 VITALS — BP 122/76 | HR 84 | Wt 258.0 lb

## 2013-08-25 DIAGNOSIS — O09891 Supervision of other high risk pregnancies, first trimester: Secondary | ICD-10-CM

## 2013-08-25 DIAGNOSIS — Z3480 Encounter for supervision of other normal pregnancy, unspecified trimester: Secondary | ICD-10-CM

## 2013-08-25 DIAGNOSIS — O09219 Supervision of pregnancy with history of pre-term labor, unspecified trimester: Secondary | ICD-10-CM

## 2013-08-25 DIAGNOSIS — O09211 Supervision of pregnancy with history of pre-term labor, first trimester: Principal | ICD-10-CM

## 2013-08-25 NOTE — Progress Notes (Signed)
Pt has nml Cervical length.  Has anatomy US scheduled.  MS symptoms stable.  On 17 P for history of pTL.  Needs to swith to Tuesday for shots.   Will do today.   Pt very anxious about going into labor and not having child care.  Suggested she look at preferred child care and talk to friends/favors.  Pt can always bring children (unless flu season) if there is an emergency and then work on childcare once at hospital.

## 2013-08-26 ENCOUNTER — Other Ambulatory Visit (HOSPITAL_COMMUNITY): Payer: BC Managed Care – PPO

## 2013-08-26 ENCOUNTER — Ambulatory Visit (HOSPITAL_COMMUNITY): Payer: BC Managed Care – PPO

## 2013-08-31 ENCOUNTER — Encounter (HOSPITAL_COMMUNITY): Payer: Self-pay

## 2013-08-31 ENCOUNTER — Ambulatory Visit (HOSPITAL_COMMUNITY)
Admission: RE | Admit: 2013-08-31 | Discharge: 2013-08-31 | Disposition: A | Discharge status: Home / Self Care | Payer: BC Managed Care – PPO | Source: Ambulatory Visit | Attending: Obstetrics & Gynecology | Admitting: Obstetrics & Gynecology

## 2013-08-31 ENCOUNTER — Other Ambulatory Visit (HOSPITAL_COMMUNITY): Payer: Self-pay | Admitting: Maternal and Fetal Medicine

## 2013-08-31 VITALS — BP 119/69 | HR 96 | Wt 260.2 lb

## 2013-08-31 DIAGNOSIS — O09211 Supervision of pregnancy with history of pre-term labor, first trimester: Secondary | ICD-10-CM

## 2013-08-31 DIAGNOSIS — Z3689 Encounter for other specified antenatal screening: Secondary | ICD-10-CM | POA: Insufficient documentation

## 2013-08-31 DIAGNOSIS — G35 Multiple sclerosis: Secondary | ICD-10-CM | POA: Insufficient documentation

## 2013-08-31 DIAGNOSIS — O99891 Other specified diseases and conditions complicating pregnancy: Secondary | ICD-10-CM | POA: Diagnosis present

## 2013-08-31 DIAGNOSIS — O10912 Unspecified pre-existing hypertension complicating pregnancy, second trimester: Secondary | ICD-10-CM

## 2013-08-31 DIAGNOSIS — O99210 Obesity complicating pregnancy, unspecified trimester: Secondary | ICD-10-CM

## 2013-08-31 DIAGNOSIS — O9989 Other specified diseases and conditions complicating pregnancy, childbirth and the puerperium: Principal | ICD-10-CM

## 2013-08-31 DIAGNOSIS — O09812 Supervision of pregnancy resulting from assisted reproductive technology, second trimester: Secondary | ICD-10-CM

## 2013-08-31 DIAGNOSIS — O09891 Supervision of other high risk pregnancies, first trimester: Secondary | ICD-10-CM

## 2013-08-31 DIAGNOSIS — E669 Obesity, unspecified: Secondary | ICD-10-CM

## 2013-09-01 ENCOUNTER — Ambulatory Visit (INDEPENDENT_AMBULATORY_CARE_PROVIDER_SITE_OTHER): Payer: BC Managed Care – PPO | Admitting: *Deleted

## 2013-09-01 DIAGNOSIS — Z8751 Personal history of pre-term labor: Secondary | ICD-10-CM

## 2013-09-01 DIAGNOSIS — O09219 Supervision of pregnancy with history of pre-term labor, unspecified trimester: Secondary | ICD-10-CM | POA: Diagnosis not present

## 2013-09-08 ENCOUNTER — Ambulatory Visit (INDEPENDENT_AMBULATORY_CARE_PROVIDER_SITE_OTHER): Payer: BC Managed Care – PPO | Admitting: *Deleted

## 2013-09-08 DIAGNOSIS — Z8751 Personal history of pre-term labor: Secondary | ICD-10-CM

## 2013-09-08 DIAGNOSIS — O09219 Supervision of pregnancy with history of pre-term labor, unspecified trimester: Secondary | ICD-10-CM

## 2013-09-15 ENCOUNTER — Ambulatory Visit (INDEPENDENT_AMBULATORY_CARE_PROVIDER_SITE_OTHER): Payer: BC Managed Care – PPO | Admitting: Obstetrics & Gynecology

## 2013-09-15 VITALS — BP 120/78 | HR 93 | Wt 262.0 lb

## 2013-09-15 DIAGNOSIS — O09219 Supervision of pregnancy with history of pre-term labor, unspecified trimester: Secondary | ICD-10-CM

## 2013-09-15 DIAGNOSIS — Z3480 Encounter for supervision of other normal pregnancy, unspecified trimester: Secondary | ICD-10-CM

## 2013-09-15 DIAGNOSIS — O09891 Supervision of other high risk pregnancies, first trimester: Secondary | ICD-10-CM

## 2013-09-15 DIAGNOSIS — O09211 Supervision of pregnancy with history of pre-term labor, first trimester: Principal | ICD-10-CM

## 2013-09-15 MED ORDER — PRENATAL PLUS 27-1 MG PO TABS: ORAL_TABLET | ORAL | Status: DC

## 2013-09-15 NOTE — Progress Notes (Signed)
Pt having arm numbness--pt attributes to MS.  Has had carpel tunnel in the past.   Pt measuring 9 days ahead by [place].  Will do early GCT.   Changed prenatal vitamins away from chewable.

## 2013-09-17 ENCOUNTER — Ambulatory Visit (HOSPITAL_COMMUNITY)
Admission: RE | Admit: 2013-09-17 | Discharge: 2013-09-17 | Disposition: A | Discharge status: Home / Self Care | Payer: BC Managed Care – PPO | Source: Ambulatory Visit | Attending: Obstetrics & Gynecology | Admitting: Obstetrics & Gynecology

## 2013-09-17 ENCOUNTER — Encounter (HOSPITAL_COMMUNITY): Payer: Self-pay

## 2013-09-17 VITALS — BP 121/84 | HR 97 | Wt 261.0 lb

## 2013-09-17 DIAGNOSIS — O09211 Supervision of pregnancy with history of pre-term labor, first trimester: Secondary | ICD-10-CM

## 2013-09-17 DIAGNOSIS — O09219 Supervision of pregnancy with history of pre-term labor, unspecified trimester: Secondary | ICD-10-CM | POA: Insufficient documentation

## 2013-09-17 DIAGNOSIS — O09891 Supervision of other high risk pregnancies, first trimester: Secondary | ICD-10-CM

## 2013-09-17 DIAGNOSIS — O10019 Pre-existing essential hypertension complicating pregnancy, unspecified trimester: Secondary | ICD-10-CM | POA: Insufficient documentation

## 2013-09-17 DIAGNOSIS — O10912 Unspecified pre-existing hypertension complicating pregnancy, second trimester: Secondary | ICD-10-CM

## 2013-09-22 ENCOUNTER — Ambulatory Visit (INDEPENDENT_AMBULATORY_CARE_PROVIDER_SITE_OTHER): Payer: BC Managed Care – PPO | Admitting: *Deleted

## 2013-09-22 DIAGNOSIS — O09219 Supervision of pregnancy with history of pre-term labor, unspecified trimester: Secondary | ICD-10-CM | POA: Diagnosis not present

## 2013-09-22 DIAGNOSIS — Z3480 Encounter for supervision of other normal pregnancy, unspecified trimester: Secondary | ICD-10-CM | POA: Diagnosis not present

## 2013-09-22 DIAGNOSIS — O99210 Obesity complicating pregnancy, unspecified trimester: Secondary | ICD-10-CM

## 2013-09-23 ENCOUNTER — Encounter: Payer: Self-pay | Admitting: Obstetrics & Gynecology

## 2013-09-23 ENCOUNTER — Telehealth: Payer: Self-pay | Admitting: *Deleted

## 2013-09-23 DIAGNOSIS — R7309 Other abnormal glucose: Secondary | ICD-10-CM

## 2013-09-23 LAB — GLUCOSE TOLERANCE, 1 HOUR (50G) W/O FASTING: Glucose, 1 Hour GTT: 150 mg/dL — ABNORMAL HIGH (ref 70–140)

## 2013-09-23 NOTE — Telephone Encounter (Signed)
Pt notified of abnormal 1 hr GTT and she is scheduled for a 3 hr GTT 9/22

## 2013-09-28 ENCOUNTER — Ambulatory Visit (INDEPENDENT_AMBULATORY_CARE_PROVIDER_SITE_OTHER): Payer: BC Managed Care – PPO | Admitting: Obstetrics & Gynecology

## 2013-09-28 VITALS — BP 121/75 | HR 91 | Wt 260.0 lb

## 2013-09-28 DIAGNOSIS — O09219 Supervision of pregnancy with history of pre-term labor, unspecified trimester: Secondary | ICD-10-CM

## 2013-09-28 DIAGNOSIS — O09891 Supervision of other high risk pregnancies, first trimester: Secondary | ICD-10-CM

## 2013-09-28 DIAGNOSIS — O10912 Unspecified pre-existing hypertension complicating pregnancy, second trimester: Secondary | ICD-10-CM

## 2013-09-28 DIAGNOSIS — Z3480 Encounter for supervision of other normal pregnancy, unspecified trimester: Secondary | ICD-10-CM

## 2013-09-28 DIAGNOSIS — O10019 Pre-existing essential hypertension complicating pregnancy, unspecified trimester: Secondary | ICD-10-CM

## 2013-09-28 DIAGNOSIS — O09211 Supervision of pregnancy with history of pre-term labor, first trimester: Secondary | ICD-10-CM

## 2013-09-28 NOTE — Progress Notes (Signed)
Pt sees neurologist for MS symptoms.  Pt not getting relief with the addition of Cobaxone.  Sees MFM for cervical length q 2 weeks.  Pt failed 1 hour and is taking 3  Hour next week.  Will repeat 3 hour at 30 weeks since she is taking the 3 hour at 24 1/2 weeks. Continue 17P. Has childcare situation better controlled.  Seems more relaxed!

## 2013-09-29 ENCOUNTER — Ambulatory Visit (INDEPENDENT_AMBULATORY_CARE_PROVIDER_SITE_OTHER): Payer: BC Managed Care – PPO | Admitting: Obstetrics & Gynecology

## 2013-09-29 VITALS — BP 130/65 | HR 97 | Wt 260.0 lb

## 2013-09-29 DIAGNOSIS — N898 Other specified noninflammatory disorders of vagina: Secondary | ICD-10-CM

## 2013-09-29 DIAGNOSIS — Z3480 Encounter for supervision of other normal pregnancy, unspecified trimester: Secondary | ICD-10-CM

## 2013-09-29 NOTE — Progress Notes (Signed)
R/O ROM

## 2013-09-29 NOTE — Progress Notes (Signed)
She is here today because she has a discharge that started yesterday and has continued today. She says that it "feels" like it did when she had PPROM with another pregnancy. She denies bleeding or contractions. On exam pool, valsalva, and nitrazine are negative. PTL/PPROM precautions reviewed.

## 2013-09-30 LAB — WET PREP, GENITAL
CLUE CELLS WET PREP: NONE SEEN
Trich, Wet Prep: NONE SEEN
Yeast Wet Prep HPF POC: NONE SEEN

## 2013-10-01 ENCOUNTER — Encounter (HOSPITAL_COMMUNITY): Payer: Self-pay

## 2013-10-01 ENCOUNTER — Ambulatory Visit (HOSPITAL_COMMUNITY)
Admission: RE | Admit: 2013-10-01 | Discharge: 2013-10-01 | Disposition: A | Discharge status: Home / Self Care | Payer: BC Managed Care – PPO | Source: Ambulatory Visit | Attending: Obstetrics & Gynecology | Admitting: Obstetrics & Gynecology

## 2013-10-01 DIAGNOSIS — O09211 Supervision of pregnancy with history of pre-term labor, first trimester: Secondary | ICD-10-CM

## 2013-10-01 DIAGNOSIS — O09819 Supervision of pregnancy resulting from assisted reproductive technology, unspecified trimester: Secondary | ICD-10-CM | POA: Insufficient documentation

## 2013-10-01 DIAGNOSIS — E669 Obesity, unspecified: Secondary | ICD-10-CM | POA: Diagnosis not present

## 2013-10-01 DIAGNOSIS — O09891 Supervision of other high risk pregnancies, first trimester: Secondary | ICD-10-CM

## 2013-10-01 DIAGNOSIS — O10019 Pre-existing essential hypertension complicating pregnancy, unspecified trimester: Secondary | ICD-10-CM | POA: Diagnosis not present

## 2013-10-01 DIAGNOSIS — O10912 Unspecified pre-existing hypertension complicating pregnancy, second trimester: Secondary | ICD-10-CM

## 2013-10-01 DIAGNOSIS — O09812 Supervision of pregnancy resulting from assisted reproductive technology, second trimester: Secondary | ICD-10-CM

## 2013-10-01 DIAGNOSIS — O09219 Supervision of pregnancy with history of pre-term labor, unspecified trimester: Secondary | ICD-10-CM | POA: Insufficient documentation

## 2013-10-01 DIAGNOSIS — O99210 Obesity complicating pregnancy, unspecified trimester: Secondary | ICD-10-CM

## 2013-10-06 ENCOUNTER — Ambulatory Visit (INDEPENDENT_AMBULATORY_CARE_PROVIDER_SITE_OTHER): Payer: BC Managed Care – PPO | Admitting: *Deleted

## 2013-10-06 ENCOUNTER — Other Ambulatory Visit: Payer: BC Managed Care – PPO

## 2013-10-06 DIAGNOSIS — O99810 Abnormal glucose complicating pregnancy: Secondary | ICD-10-CM | POA: Diagnosis not present

## 2013-10-06 DIAGNOSIS — R7309 Other abnormal glucose: Secondary | ICD-10-CM

## 2013-10-06 DIAGNOSIS — O09219 Supervision of pregnancy with history of pre-term labor, unspecified trimester: Secondary | ICD-10-CM | POA: Diagnosis not present

## 2013-10-06 DIAGNOSIS — O09891 Supervision of other high risk pregnancies, first trimester: Secondary | ICD-10-CM

## 2013-10-06 DIAGNOSIS — O09211 Supervision of pregnancy with history of pre-term labor, first trimester: Principal | ICD-10-CM

## 2013-10-06 NOTE — Progress Notes (Signed)
17-P and 3 hr GTT done

## 2013-10-07 ENCOUNTER — Telehealth: Payer: Self-pay | Admitting: *Deleted

## 2013-10-07 ENCOUNTER — Encounter: Payer: Self-pay | Admitting: Obstetrics & Gynecology

## 2013-10-07 LAB — GLUCOSE TOLERANCE, 3 HOURS
GLUCOSE, 2 HOUR-GESTATIONAL: 143 mg/dL (ref 70–164)
Glucose Tolerance, 1 hour: 168 mg/dL (ref 70–189)
Glucose Tolerance, Fasting: 82 mg/dL (ref 70–104)
Glucose, GTT - 3 Hour: 106 mg/dL (ref 70–144)

## 2013-10-07 NOTE — Telephone Encounter (Signed)
Message copied by Granville Lewis on Wed Oct 07, 2013 11:43 AM ------      Message from: Marylynn Pearson L      Created: Wed Oct 07, 2013 10:08 AM                   ----- Message -----         From: Lesly Dukes, MD         Sent: 10/07/2013   9:56 AM           To: [facility]            Nml 3 hr.  Pt can start prednisone as per neurology recommendations ------

## 2013-10-07 NOTE — Telephone Encounter (Signed)
Pt notified of the fact that she may start prednison as per her neurologist recommendations.

## 2013-10-07 NOTE — Telephone Encounter (Signed)
Called pt to adv 3 hour GTT was WNL - LMOM

## 2013-10-08 ENCOUNTER — Other Ambulatory Visit: Payer: Self-pay | Admitting: Obstetrics & Gynecology

## 2013-10-08 DIAGNOSIS — G35 Multiple sclerosis: Secondary | ICD-10-CM

## 2013-10-08 DIAGNOSIS — O09299 Supervision of pregnancy with other poor reproductive or obstetric history, unspecified trimester: Secondary | ICD-10-CM

## 2013-10-08 DIAGNOSIS — O9989 Other specified diseases and conditions complicating pregnancy, childbirth and the puerperium: Secondary | ICD-10-CM

## 2013-10-08 DIAGNOSIS — E669 Obesity, unspecified: Secondary | ICD-10-CM

## 2013-10-08 DIAGNOSIS — O09812 Supervision of pregnancy resulting from assisted reproductive technology, second trimester: Secondary | ICD-10-CM

## 2013-10-08 DIAGNOSIS — O99891 Other specified diseases and conditions complicating pregnancy: Secondary | ICD-10-CM

## 2013-10-08 DIAGNOSIS — O99210 Obesity complicating pregnancy, unspecified trimester: Secondary | ICD-10-CM

## 2013-10-08 DIAGNOSIS — Z8751 Personal history of pre-term labor: Secondary | ICD-10-CM

## 2013-10-13 ENCOUNTER — Ambulatory Visit (INDEPENDENT_AMBULATORY_CARE_PROVIDER_SITE_OTHER): Payer: BC Managed Care – PPO | Admitting: *Deleted

## 2013-10-13 DIAGNOSIS — O09219 Supervision of pregnancy with history of pre-term labor, unspecified trimester: Secondary | ICD-10-CM

## 2013-10-15 ENCOUNTER — Ambulatory Visit (HOSPITAL_COMMUNITY)
Admission: RE | Admit: 2013-10-15 | Discharge: 2013-10-15 | Disposition: A | Discharge status: Home / Self Care | Payer: BC Managed Care – PPO | Source: Ambulatory Visit | Attending: Obstetrics & Gynecology | Admitting: Obstetrics & Gynecology

## 2013-10-15 VITALS — BP 103/69 | HR 89 | Wt 261.2 lb

## 2013-10-15 DIAGNOSIS — O99210 Obesity complicating pregnancy, unspecified trimester: Secondary | ICD-10-CM | POA: Insufficient documentation

## 2013-10-15 DIAGNOSIS — O09812 Supervision of pregnancy resulting from assisted reproductive technology, second trimester: Secondary | ICD-10-CM | POA: Diagnosis not present

## 2013-10-15 DIAGNOSIS — Z3A26 26 weeks gestation of pregnancy: Secondary | ICD-10-CM | POA: Diagnosis not present

## 2013-10-15 DIAGNOSIS — O09299 Supervision of pregnancy with other poor reproductive or obstetric history, unspecified trimester: Secondary | ICD-10-CM | POA: Insufficient documentation

## 2013-10-15 DIAGNOSIS — O09892 Supervision of other high risk pregnancies, second trimester: Secondary | ICD-10-CM

## 2013-10-15 DIAGNOSIS — O99891 Other specified diseases and conditions complicating pregnancy: Secondary | ICD-10-CM

## 2013-10-15 DIAGNOSIS — O9989 Other specified diseases and conditions complicating pregnancy, childbirth and the puerperium: Secondary | ICD-10-CM | POA: Insufficient documentation

## 2013-10-15 DIAGNOSIS — O09212 Supervision of pregnancy with history of pre-term labor, second trimester: Secondary | ICD-10-CM | POA: Diagnosis not present

## 2013-10-15 DIAGNOSIS — O99212 Obesity complicating pregnancy, second trimester: Secondary | ICD-10-CM

## 2013-10-15 DIAGNOSIS — Z8751 Personal history of pre-term labor: Secondary | ICD-10-CM

## 2013-10-15 DIAGNOSIS — E669 Obesity, unspecified: Secondary | ICD-10-CM | POA: Insufficient documentation

## 2013-10-15 DIAGNOSIS — G35 Multiple sclerosis: Secondary | ICD-10-CM | POA: Diagnosis not present

## 2013-10-21 ENCOUNTER — Ambulatory Visit (INDEPENDENT_AMBULATORY_CARE_PROVIDER_SITE_OTHER): Payer: BC Managed Care – PPO | Admitting: Obstetrics & Gynecology

## 2013-10-21 VITALS — BP 126/81 | HR 100 | Wt 264.0 lb

## 2013-10-21 DIAGNOSIS — O1211 Gestational proteinuria, first trimester: Secondary | ICD-10-CM

## 2013-10-21 DIAGNOSIS — O09211 Supervision of pregnancy with history of pre-term labor, first trimester: Secondary | ICD-10-CM | POA: Diagnosis not present

## 2013-10-21 DIAGNOSIS — O09891 Supervision of other high risk pregnancies, first trimester: Secondary | ICD-10-CM

## 2013-10-21 NOTE — Progress Notes (Signed)
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

## 2013-10-21 NOTE — Progress Notes (Signed)
One last cervical [place] by MFM.  Pt going to take prednisone under neurology recommendations.  Will post pone 3rd trimester GTT until one week after finishing steroids.  Pt will come in for cbg 4 days into prednisone and check random cbg.  Will discuss with MFM about recommendations for induction with MS and baseline proteinuria.

## 2013-10-26 ENCOUNTER — Telehealth: Payer: Self-pay | Admitting: *Deleted

## 2013-10-26 NOTE — Telephone Encounter (Signed)
Pt called to state that she had a reaction to the Prednisone last night.  She had numbness over all her body.  She talked with her neurologist today and she is not going to take the steroids any more.  She has a regular appt with MFM.  We will not be doing a CBG on Thursday.

## 2013-10-27 ENCOUNTER — Encounter (HOSPITAL_COMMUNITY): Payer: Self-pay

## 2013-10-27 ENCOUNTER — Ambulatory Visit (HOSPITAL_COMMUNITY)
Admission: RE | Admit: 2013-10-27 | Discharge: 2013-10-27 | Disposition: A | Discharge status: Home / Self Care | Payer: BC Managed Care – PPO | Source: Ambulatory Visit | Attending: Obstetrics & Gynecology | Admitting: Obstetrics & Gynecology

## 2013-10-27 ENCOUNTER — Other Ambulatory Visit: Payer: Self-pay | Admitting: Obstetrics & Gynecology

## 2013-10-27 VITALS — BP 112/73 | HR 98 | Wt 264.0 lb

## 2013-10-27 DIAGNOSIS — O99891 Other specified diseases and conditions complicating pregnancy: Secondary | ICD-10-CM

## 2013-10-27 DIAGNOSIS — O10012 Pre-existing essential hypertension complicating pregnancy, second trimester: Secondary | ICD-10-CM | POA: Diagnosis not present

## 2013-10-27 DIAGNOSIS — O9989 Other specified diseases and conditions complicating pregnancy, childbirth and the puerperium: Secondary | ICD-10-CM | POA: Insufficient documentation

## 2013-10-27 DIAGNOSIS — O99212 Obesity complicating pregnancy, second trimester: Secondary | ICD-10-CM | POA: Diagnosis not present

## 2013-10-27 DIAGNOSIS — O09892 Supervision of other high risk pregnancies, second trimester: Secondary | ICD-10-CM

## 2013-10-27 DIAGNOSIS — O09812 Supervision of pregnancy resulting from assisted reproductive technology, second trimester: Secondary | ICD-10-CM | POA: Diagnosis not present

## 2013-10-27 DIAGNOSIS — G35 Multiple sclerosis: Secondary | ICD-10-CM | POA: Insufficient documentation

## 2013-10-27 DIAGNOSIS — E669 Obesity, unspecified: Secondary | ICD-10-CM | POA: Diagnosis not present

## 2013-10-27 DIAGNOSIS — O10912 Unspecified pre-existing hypertension complicating pregnancy, second trimester: Secondary | ICD-10-CM

## 2013-10-27 DIAGNOSIS — O09212 Supervision of pregnancy with history of pre-term labor, second trimester: Secondary | ICD-10-CM

## 2013-10-27 DIAGNOSIS — O09299 Supervision of pregnancy with other poor reproductive or obstetric history, unspecified trimester: Secondary | ICD-10-CM

## 2013-10-27 DIAGNOSIS — Z8751 Personal history of pre-term labor: Secondary | ICD-10-CM | POA: Insufficient documentation

## 2013-10-27 DIAGNOSIS — O99210 Obesity complicating pregnancy, unspecified trimester: Secondary | ICD-10-CM

## 2013-10-27 DIAGNOSIS — Z3A27 27 weeks gestation of pregnancy: Secondary | ICD-10-CM

## 2013-10-29 ENCOUNTER — Ambulatory Visit (INDEPENDENT_AMBULATORY_CARE_PROVIDER_SITE_OTHER): Payer: BC Managed Care – PPO | Admitting: *Deleted

## 2013-10-29 DIAGNOSIS — Z8751 Personal history of pre-term labor: Secondary | ICD-10-CM

## 2013-11-03 ENCOUNTER — Ambulatory Visit (INDEPENDENT_AMBULATORY_CARE_PROVIDER_SITE_OTHER): Payer: BC Managed Care – PPO | Admitting: Obstetrics & Gynecology

## 2013-11-03 ENCOUNTER — Encounter: Payer: Self-pay | Admitting: Obstetrics & Gynecology

## 2013-11-03 VITALS — BP 120/60 | HR 98 | Wt 265.0 lb

## 2013-11-03 DIAGNOSIS — Z23 Encounter for immunization: Secondary | ICD-10-CM | POA: Diagnosis not present

## 2013-11-03 DIAGNOSIS — O09891 Supervision of other high risk pregnancies, first trimester: Secondary | ICD-10-CM

## 2013-11-03 DIAGNOSIS — O09211 Supervision of pregnancy with history of pre-term labor, first trimester: Secondary | ICD-10-CM

## 2013-11-03 MED ORDER — HYDROCORTISONE 2.5 % RE CREA: 1.0000 "application " | TOPICAL_CREAM | Freq: Two times a day (BID) | RECTAL | Status: DC

## 2013-11-03 MED ORDER — INFLUENZA VAC SPLIT QUAD 0.5 ML IM SUSY: 0.5000 mL | PREFILLED_SYRINGE | Freq: Once | INTRAMUSCULAR | Status: DC

## 2013-11-03 NOTE — Progress Notes (Signed)
Routine visit. FM but baby does goes for a few hours at a time without moving. This is no change for her. Flu and TDAP and labs today. She has large hemorrhoids and I will prescribe annusol. Her last u/s last week was fine and she has another scheduled in 4 weeks. She will continue weekly 17-P.

## 2013-11-03 NOTE — Progress Notes (Signed)
Having rectal bleeding from possible hemorrhoid

## 2013-11-06 IMAGING — US US FETAL BPP W/O NONSTRESS
1 series · 13 of 28 positions shown · non-contrast
Comparison: none

CLINICAL DATA: Twin pregnancy.  Premature rupture of membranes.
Estimated gestational age by LMP is 31 weeks 6 days.  Comparison
with 12/21/2011.

[Series 1: us fetal bpp w/o nonstress · non-contrast · 13 of 40 slices shown]
[im 2/40]
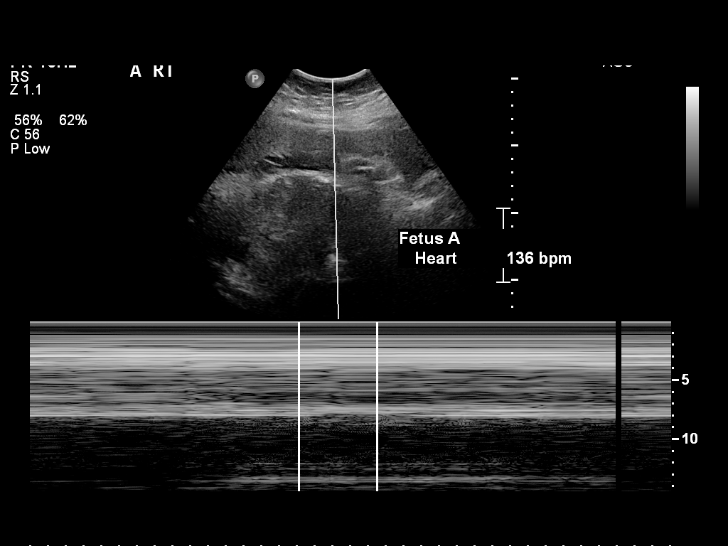
[im 5/40]
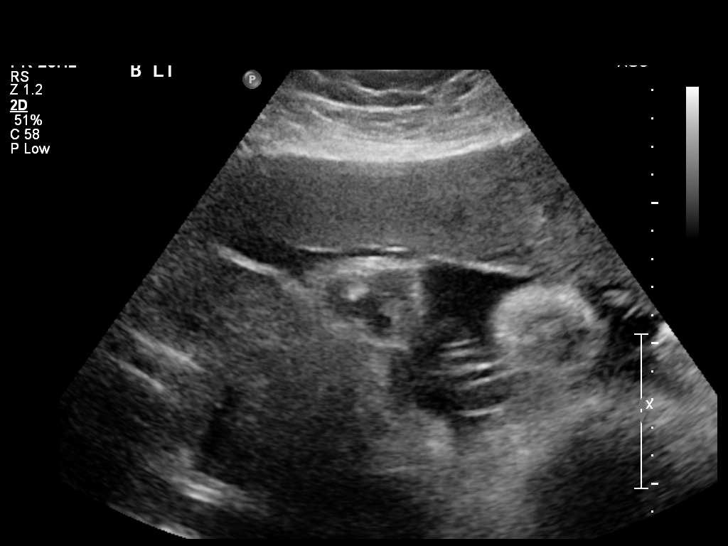
[im 8/40]
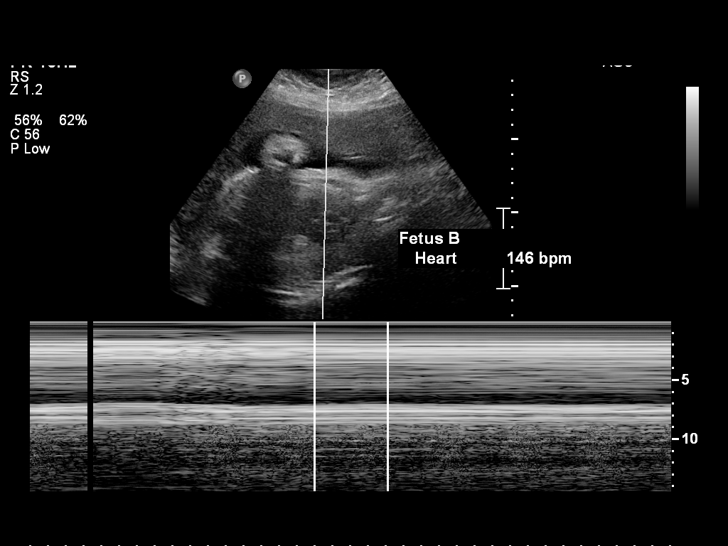
[im 11/40]
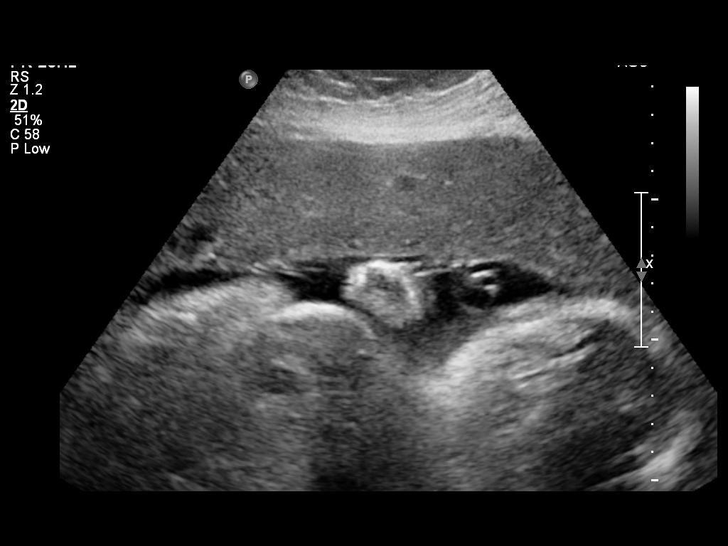
[im 14/40]
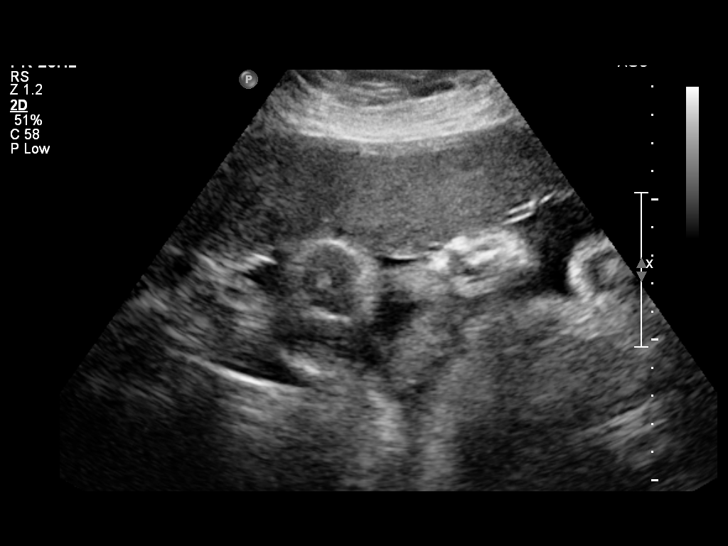
[im 16/40]
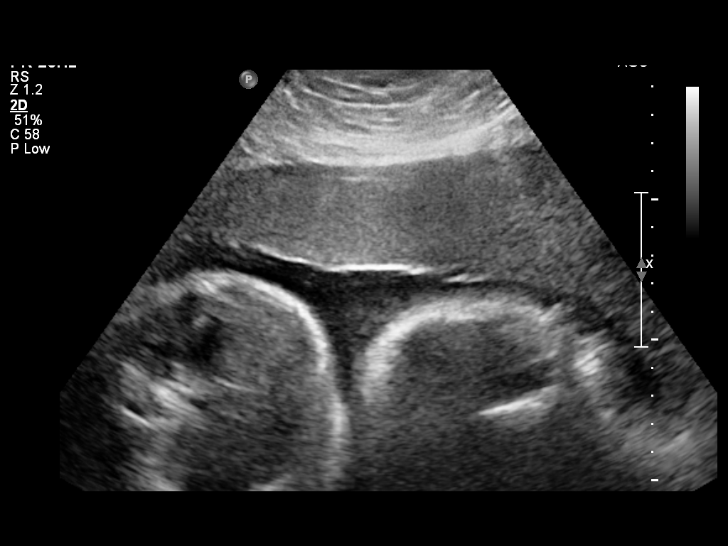
[im 21/40]
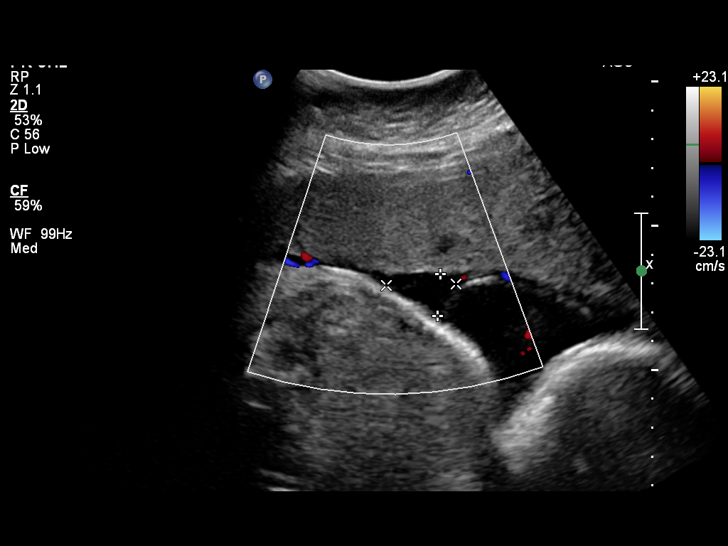
[im 24/40]
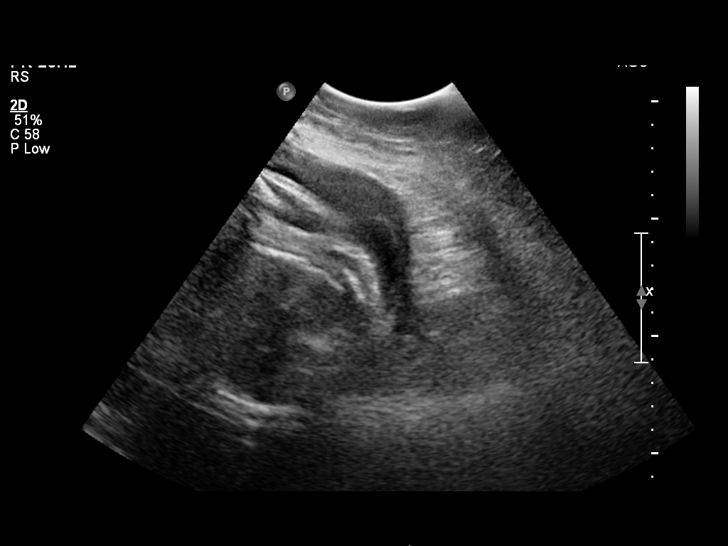
[im 27/40]
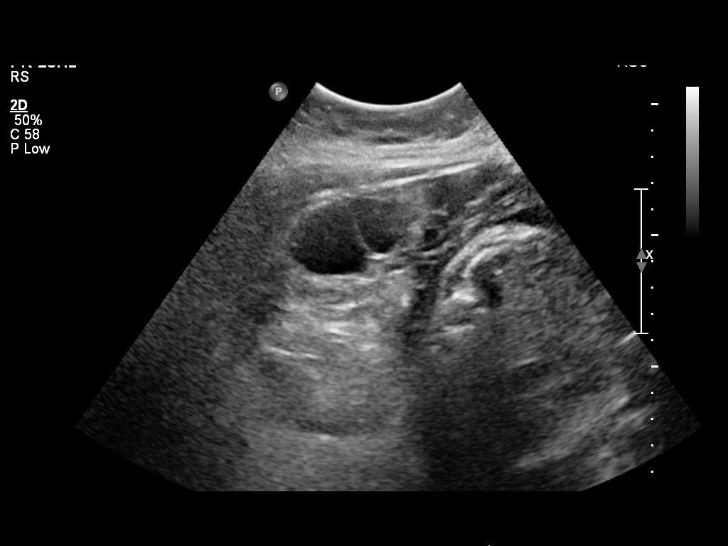
[im 29/40]
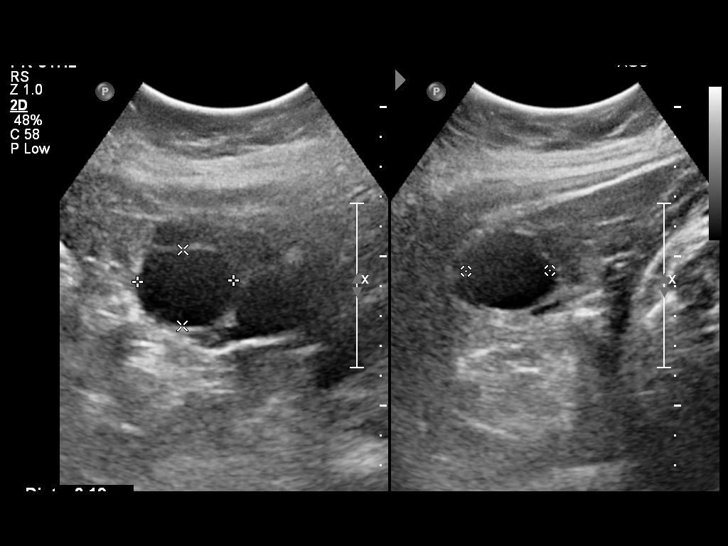
[im 32/40]
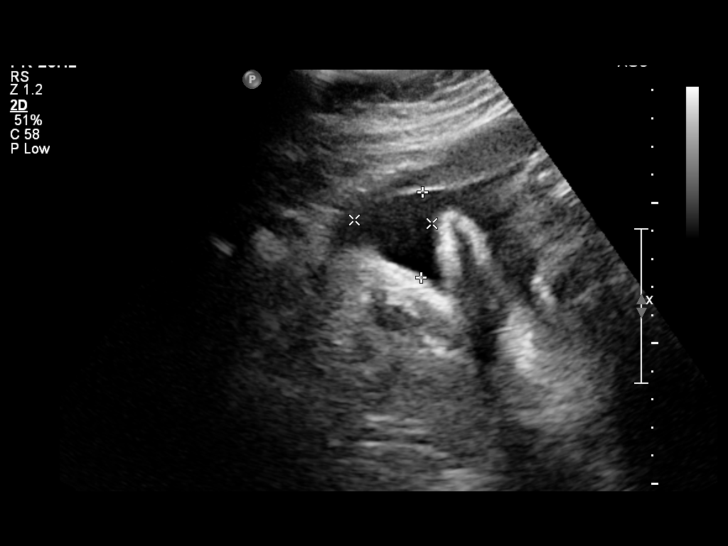
[im 35/40]
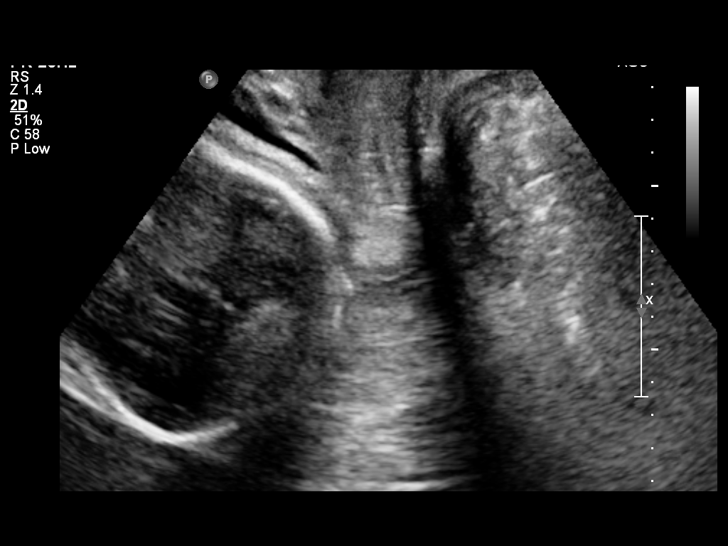
[im 38/40]
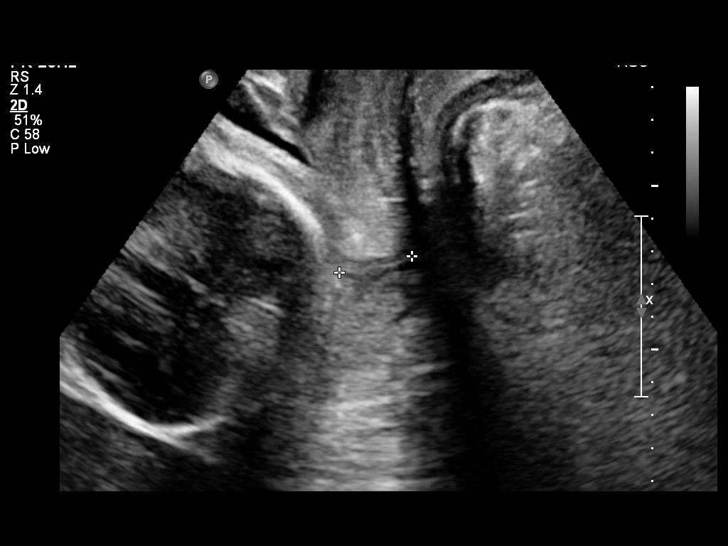

[13 of 28 positions shown; findings below may reference images not displayed]

LIMITED OBSTETRIC ULTRASOUND

Number of Fetuses: 2

TWIN A:

Heart Rate: 136 bpm
Movement: Fetal movement is observed.
Presentation: Cephalic to the right.
Placental Location: Anterior.
Previa: No previa.
Amniotic Fluid (Subjective): Lower limits of normal.

Vertical pocket:  5.8cm )

Fetal measurements were obtained on previous study and not repeated
today.

TWIN B:

Heart Rate: [.. 146.] bpm

Movement: [. Fetal movement is visualized...]

Presentation: [. Cephalic towards the left..]

Placental Location: [.. Anterior.]

Previa: [ no previa...]

Amniotic Fluid (Subjective): [. Normal..]

Vertical pocket:  [. 5.5..]cm )

Fetal measurements were obtained on the previous study and not
repeated today.

BIOPHYSICAL PROFILE:

Twin A: Elapsed time is 15 minutes.  Biophysical profile is [DATE]
with positive scores for fetal movement, breathing, tone, and
amniotic fluid.

Twin B:  Elapsed is 5 minutes.  Biophysical profile is [DATE] with
positive scores for fetal movement, breathing, tone, amniotic
fluid.

MATERNAL FINDINGS:

Cervix: The cervix appears closed.  Cervical length is measured at
1.8 cm on translabial imaging.
Uterus/Adnexae: Uterus is incompletely visualized.  Right ovary is
visualized and measures right ovary is visualized and contains a
cyst measuring 3.2 x 2.5 x 2.8 cm.  Left ovary is not identified.
IMPRESSION: Twin pregnancy. Amniotic fluid volume low normal for Twin A and
normal for Twin B.  Fetal movement and cardiac activity were
identified on both twins.  Biophysical profile [DATE] for both twins.

Recommend followup with non-emergent complete OB 14+ wk US
examination for fetal biometric evaluation and anatomic survey if
not already performed.

## 2013-11-10 ENCOUNTER — Ambulatory Visit: Payer: BC Managed Care – PPO

## 2013-11-12 ENCOUNTER — Ambulatory Visit (INDEPENDENT_AMBULATORY_CARE_PROVIDER_SITE_OTHER): Payer: BC Managed Care – PPO | Admitting: *Deleted

## 2013-11-12 ENCOUNTER — Encounter: Payer: BC Managed Care – PPO | Admitting: Obstetrics & Gynecology

## 2013-11-12 VITALS — BP 125/83 | HR 95

## 2013-11-12 DIAGNOSIS — Z8751 Personal history of pre-term labor: Secondary | ICD-10-CM | POA: Diagnosis not present

## 2013-11-18 ENCOUNTER — Ambulatory Visit (INDEPENDENT_AMBULATORY_CARE_PROVIDER_SITE_OTHER): Payer: BC Managed Care – PPO | Admitting: Obstetrics & Gynecology

## 2013-11-18 VITALS — BP 125/78 | HR 95 | Wt 267.0 lb

## 2013-11-18 DIAGNOSIS — Z3493 Encounter for supervision of normal pregnancy, unspecified, third trimester: Secondary | ICD-10-CM | POA: Diagnosis not present

## 2013-11-18 LAB — POCT CBG (FASTING - GLUCOSE)-MANUAL ENTRY: Glucose Fasting, POC: 42 mg/dL — AB (ref 70–99)

## 2013-11-18 NOTE — Progress Notes (Signed)
Pt still needs 3 hour GTT. [place] schedule in [place]. No other issues.  Last growth was 80%  No signs of PTL.

## 2013-11-24 ENCOUNTER — Other Ambulatory Visit: Payer: Self-pay | Admitting: Obstetrics & Gynecology

## 2013-11-24 ENCOUNTER — Other Ambulatory Visit: Payer: BC Managed Care – PPO

## 2013-11-24 ENCOUNTER — Ambulatory Visit (HOSPITAL_COMMUNITY)
Admission: RE | Admit: 2013-11-24 | Discharge: 2013-11-24 | Disposition: A | Discharge status: Home / Self Care | Payer: BC Managed Care – PPO | Source: Ambulatory Visit | Attending: Obstetrics & Gynecology | Admitting: Obstetrics & Gynecology

## 2013-11-24 ENCOUNTER — Encounter (HOSPITAL_COMMUNITY): Payer: Self-pay

## 2013-11-24 DIAGNOSIS — E669 Obesity, unspecified: Secondary | ICD-10-CM

## 2013-11-24 DIAGNOSIS — O09891 Supervision of other high risk pregnancies, first trimester: Secondary | ICD-10-CM

## 2013-11-24 DIAGNOSIS — O9989 Other specified diseases and conditions complicating pregnancy, childbirth and the puerperium: Secondary | ICD-10-CM

## 2013-11-24 DIAGNOSIS — Z3A31 31 weeks gestation of pregnancy: Secondary | ICD-10-CM | POA: Insufficient documentation

## 2013-11-24 DIAGNOSIS — O09812 Supervision of pregnancy resulting from assisted reproductive technology, second trimester: Secondary | ICD-10-CM

## 2013-11-24 DIAGNOSIS — O09813 Supervision of pregnancy resulting from assisted reproductive technology, third trimester: Secondary | ICD-10-CM | POA: Diagnosis not present

## 2013-11-24 DIAGNOSIS — O99213 Obesity complicating pregnancy, third trimester: Secondary | ICD-10-CM | POA: Insufficient documentation

## 2013-11-24 DIAGNOSIS — R7309 Other abnormal glucose: Secondary | ICD-10-CM

## 2013-11-24 DIAGNOSIS — O99891 Other specified diseases and conditions complicating pregnancy: Secondary | ICD-10-CM

## 2013-11-24 DIAGNOSIS — G35A Relapsing-remitting multiple sclerosis: Secondary | ICD-10-CM | POA: Insufficient documentation

## 2013-11-24 DIAGNOSIS — O99210 Obesity complicating pregnancy, unspecified trimester: Secondary | ICD-10-CM

## 2013-11-24 DIAGNOSIS — G35 Multiple sclerosis: Secondary | ICD-10-CM

## 2013-11-24 DIAGNOSIS — O09299 Supervision of pregnancy with other poor reproductive or obstetric history, unspecified trimester: Secondary | ICD-10-CM

## 2013-11-24 DIAGNOSIS — O09211 Supervision of pregnancy with history of pre-term labor, first trimester: Principal | ICD-10-CM

## 2013-11-24 DIAGNOSIS — O09213 Supervision of pregnancy with history of pre-term labor, third trimester: Secondary | ICD-10-CM | POA: Insufficient documentation

## 2013-11-24 DIAGNOSIS — Z8751 Personal history of pre-term labor: Secondary | ICD-10-CM

## 2013-11-25 LAB — GLUCOSE TOLERANCE, 3 HOURS
GLUCOSE, 1 HOUR-GESTATIONAL: 187 mg/dL (ref 70–189)
GLUCOSE, 2 HOUR-GESTATIONAL: 117 mg/dL (ref 70–164)
Glucose Tolerance, Fasting: 93 mg/dL (ref 70–104)
Glucose, GTT - 3 Hour: 88 mg/dL (ref 70–144)

## 2013-11-26 ENCOUNTER — Ambulatory Visit: Payer: BC Managed Care – PPO | Admitting: *Deleted

## 2013-11-26 VITALS — BP 117/76 | HR 113 | Resp 16

## 2013-11-26 DIAGNOSIS — Z8751 Personal history of pre-term labor: Secondary | ICD-10-CM

## 2013-11-26 NOTE — Progress Notes (Signed)
17-P given today and BP checked - BP wnl.

## 2013-11-29 NOTE — Progress Notes (Signed)
Growth SU shows baby at 87% with AC>97%.  Borderline GTT (one abnml value and fasting is 93).  Will rpt growth in 4 weeks.  Fasting CBGs.

## 2013-12-01 ENCOUNTER — Ambulatory Visit (INDEPENDENT_AMBULATORY_CARE_PROVIDER_SITE_OTHER): Payer: BC Managed Care – PPO | Admitting: Obstetrics & Gynecology

## 2013-12-01 VITALS — BP 124/71 | HR 111 | Wt 267.0 lb

## 2013-12-01 DIAGNOSIS — R7309 Other abnormal glucose: Secondary | ICD-10-CM

## 2013-12-01 DIAGNOSIS — Z3483 Encounter for supervision of other normal pregnancy, third trimester: Secondary | ICD-10-CM

## 2013-12-01 DIAGNOSIS — R7302 Impaired glucose tolerance (oral): Secondary | ICD-10-CM

## 2013-12-01 LAB — GLUCOSE, POCT (MANUAL RESULT ENTRY): POC GLUCOSE: 114 mg/dL — AB (ref 70–99)

## 2013-12-01 NOTE — Progress Notes (Signed)
BP recheck 124/71

## 2013-12-01 NOTE — Progress Notes (Signed)
CBG-  Non-fasting 114

## 2013-12-01 NOTE — Progress Notes (Signed)
Routine visit. Good FM. Denies any changes in her headaches. She has had occasional scotomata throughout this pregnancy. DTRs -1+ BL and equal. Pre eclampsia precautions reviewed. She has a growth u/s in a couple of weeks already scheduled. BP resolved. 124/71 on recheck.

## 2013-12-02 ENCOUNTER — Encounter: Payer: BC Managed Care – PPO | Admitting: Obstetrics & Gynecology

## 2013-12-04 ENCOUNTER — Ambulatory Visit: Payer: BC Managed Care – PPO | Admitting: General Practice

## 2013-12-04 VITALS — BP 120/74 | HR 95 | Temp 98.4°F | Ht 66.0 in | Wt 266.8 lb

## 2013-12-04 DIAGNOSIS — Z0130 Encounter for examination of blood pressure without abnormal findings: Secondary | ICD-10-CM

## 2013-12-04 NOTE — Progress Notes (Signed)
Patient here for BP check today. BP has been normal throughout this pregnancy until one high BP at 11/18 appt. BP 120/74 today. Patient has follow up appt scheduled for 11/30. Reviewed BP with Dr Erin Fulling who stated BP was fine and patient to follow up as scheduled.

## 2013-12-13 ENCOUNTER — Inpatient Hospital Stay (HOSPITAL_COMMUNITY)
Admission: AD | Admit: 2013-12-13 | Discharge: 2013-12-13 | Disposition: A | Discharge status: Home / Self Care | Payer: BC Managed Care – PPO | Source: Ambulatory Visit | Attending: Obstetrics & Gynecology | Admitting: Obstetrics & Gynecology

## 2013-12-13 ENCOUNTER — Encounter (HOSPITAL_COMMUNITY): Payer: Self-pay

## 2013-12-13 DIAGNOSIS — O9989 Other specified diseases and conditions complicating pregnancy, childbirth and the puerperium: Secondary | ICD-10-CM | POA: Diagnosis not present

## 2013-12-13 DIAGNOSIS — H538 Other visual disturbances: Secondary | ICD-10-CM | POA: Insufficient documentation

## 2013-12-13 DIAGNOSIS — R51 Headache: Secondary | ICD-10-CM | POA: Insufficient documentation

## 2013-12-13 DIAGNOSIS — Z3A34 34 weeks gestation of pregnancy: Secondary | ICD-10-CM | POA: Insufficient documentation

## 2013-12-13 DIAGNOSIS — Z87891 Personal history of nicotine dependence: Secondary | ICD-10-CM | POA: Insufficient documentation

## 2013-12-13 LAB — URINALYSIS, ROUTINE W REFLEX MICROSCOPIC
Bilirubin Urine: NEGATIVE
GLUCOSE, UA: NEGATIVE mg/dL
KETONES UR: 15 mg/dL — AB
Leukocytes, UA: NEGATIVE
NITRITE: NEGATIVE
PH: 6 (ref 5.0–8.0)
Protein, ur: 100 mg/dL — AB
Specific Gravity, Urine: >1.03 — ABNORMAL HIGH (ref 1.005–1.030)
Urobilinogen, UA: 0.2 mg/dL (ref 0.0–1.0)

## 2013-12-13 LAB — URINE MICROSCOPIC-ADD ON

## 2013-12-13 MED ORDER — BUTALBITAL-APAP-CAFFEINE 50-325-40 MG PO TABS
1.0000 | ORAL_TABLET | Freq: Once | ORAL | Status: AC
  Administered 2013-12-13: 1 via ORAL
  Filled 2013-12-13: qty 1

## 2013-12-13 NOTE — Discharge Instructions (Signed)
Third Trimester of Pregnancy °The third trimester is from week 29 through week 42, months 7 through 9. The third trimester is a time when the fetus is growing rapidly. At the end of the ninth month, the fetus is about 20 inches in length and weighs 6-10 pounds.  °BODY CHANGES °Your body goes through many changes during pregnancy. The changes vary from woman to woman.  °· Your weight will continue to increase. You can expect to gain 25-35 pounds (11-16 kg) by the end of the pregnancy. °· You may begin to get stretch marks on your hips, abdomen, and breasts. °· You may urinate more often because the fetus is moving lower into your pelvis and pressing on your bladder. °· You may develop or continue to have heartburn as a result of your pregnancy. °· You may develop constipation because certain hormones are causing the muscles that push waste through your intestines to slow down. °· You may develop hemorrhoids or swollen, bulging veins (varicose veins). °· You may have pelvic pain because of the weight gain and pregnancy hormones relaxing your joints between the bones in your pelvis. Backaches may result from overexertion of the muscles supporting your posture. °· You may have changes in your hair. These can include thickening of your hair, rapid growth, and changes in texture. Some women also have hair loss during or after pregnancy, or hair that feels dry or thin. Your hair will most likely return to normal after your baby is born. °· Your breasts will continue to grow and be tender. A yellow discharge may leak from your breasts called colostrum. °· Your belly button may stick out. °· You may feel short of breath because of your expanding uterus. °· You may notice the fetus "dropping," or moving lower in your abdomen. °· You may have a bloody mucus discharge. This usually occurs a few days to a week before labor begins. °· Your cervix becomes thin and soft (effaced) near your due date. °WHAT TO EXPECT AT YOUR PRENATAL  EXAMS  °You will have prenatal exams every 2 weeks until week 36. Then, you will have weekly prenatal exams. During a routine prenatal visit: °· You will be weighed to make sure you and the fetus are growing normally. °· Your blood pressure is taken. °· Your abdomen will be measured to track your baby's growth. °· The fetal heartbeat will be listened to. °· Any test results from the previous visit will be discussed. °· You may have a cervical check near your due date to see if you have effaced. °At around 36 weeks, your caregiver will check your cervix. At the same time, your caregiver will also perform a test on the secretions of the vaginal tissue. This test is to determine if a type of bacteria, Group B streptococcus, is present. Your caregiver will explain this further. °Your caregiver may ask you: °· What your birth plan is. °· How you are feeling. °· If you are feeling the baby move. °· If you have had any abnormal symptoms, such as leaking fluid, bleeding, severe headaches, or abdominal cramping. °· If you have any questions. °Other tests or screenings that may be performed during your third trimester include: °· Blood tests that check for low iron levels (anemia). °· Fetal testing to check the health, activity level, and growth of the fetus. Testing is done if you have certain medical conditions or if there are problems during the pregnancy. °FALSE LABOR °You may feel small, irregular contractions that   eventually go away. These are called Braxton Hicks contractions, or false labor. Contractions may last for hours, days, or even weeks before true labor sets in. If contractions come at regular intervals, intensify, or become painful, it is best to be seen by your caregiver.  °SIGNS OF LABOR  °· Menstrual-like cramps. °· Contractions that are 5 minutes apart or less. °· Contractions that start on the top of the uterus and spread down to the lower abdomen and back. °· A sense of increased pelvic pressure or back  pain. °· A watery or bloody mucus discharge that comes from the vagina. °If you have any of these signs before the 37th week of pregnancy, call your caregiver right away. You need to go to the hospital to get checked immediately. °HOME CARE INSTRUCTIONS  °· Avoid all smoking, herbs, alcohol, and unprescribed drugs. These chemicals affect the formation and growth of the baby. °· Follow your caregiver's instructions regarding medicine use. There are medicines that are either safe or unsafe to take during pregnancy. °· Exercise only as directed by your caregiver. Experiencing uterine cramps is a good sign to stop exercising. °· Continue to eat regular, healthy meals. °· Wear a good support bra for breast tenderness. °· Do not use hot tubs, steam rooms, or saunas. °· Wear your seat belt at all times when driving. °· Avoid raw meat, uncooked cheese, cat litter boxes, and soil used by cats. These carry germs that can cause birth defects in the baby. °· Take your prenatal vitamins. °· Try taking a stool softener (if your caregiver approves) if you develop constipation. Eat more high-fiber foods, such as fresh vegetables or fruit and whole grains. Drink plenty of fluids to keep your urine clear or pale yellow. °· Take warm sitz baths to soothe any pain or discomfort caused by hemorrhoids. Use hemorrhoid cream if your caregiver approves. °· If you develop varicose veins, wear support hose. Elevate your feet for 15 minutes, 3-4 times a day. Limit salt in your diet. °· Avoid heavy lifting, wear low heal shoes, and practice good posture. °· Rest a lot with your legs elevated if you have leg cramps or low back pain. °· Visit your dentist if you have not gone during your pregnancy. Use a soft toothbrush to brush your teeth and be gentle when you floss. °· A sexual relationship may be continued unless your caregiver directs you otherwise. °· Do not travel far distances unless it is absolutely necessary and only with the approval  of your caregiver. °· Take prenatal classes to understand, practice, and ask questions about the labor and delivery. °· Make a trial run to the hospital. °· Pack your hospital bag. °· Prepare the baby's nursery. °· Continue to go to all your prenatal visits as directed by your caregiver. °SEEK MEDICAL CARE IF: °· You are unsure if you are in labor or if your water has broken. °· You have dizziness. °· You have mild pelvic cramps, pelvic pressure, or nagging pain in your abdominal area. °· You have persistent nausea, vomiting, or diarrhea. °· You have a bad smelling vaginal discharge. °· You have pain with urination. °SEEK IMMEDIATE MEDICAL CARE IF:  °· You have a fever. °· You are leaking fluid from your vagina. °· You have spotting or bleeding from your vagina. °· You have severe abdominal cramping or pain. °· You have rapid weight loss or gain. °· You have shortness of breath with chest pain. °· You notice sudden or extreme swelling   of your face, hands, ankles, feet, or legs. °· You have not felt your baby move in over an hour. °· You have severe headaches that do not go away with medicine. °· You have vision changes. °Document Released: 12/26/2000 Document Revised: 01/06/2013 Document Reviewed: 03/04/2012 °[employer]® Patient Information ©2015 [employer]. This information is not intended to replace advice given to you by your health care provider. Make sure you discuss any questions you have with your health care provider. °Fetal Movement Counts °Patient Name: __________________________________________________ Patient Due Date: ____________________ °Performing a fetal movement count is highly recommended in high-risk pregnancies, but it is good for every pregnant woman to do. Your health care provider may ask you to start counting fetal movements at 28 weeks of the pregnancy. Fetal movements often increase: °· After eating a full meal. °· After physical activity. °· After eating or drinking something sweet or  cold. °· At rest. °Pay attention to when you feel the baby is most active. This will help you notice a pattern of your baby's sleep and wake cycles and what factors contribute to an increase in fetal movement. It is important to perform a fetal movement count at the same time each day when your baby is normally most active.  °HOW TO COUNT FETAL MOVEMENTS °· Find a quiet and comfortable area to sit or lie down on your left side. Lying on your left side provides the best blood and oxygen circulation to your baby. °· Write down the day and time on a sheet of paper or in a journal. °· Start counting kicks, flutters, swishes, rolls, or jabs in a 2-hour period. You should feel at least 10 movements within 2 hours. °· If you do not feel 10 movements in 2 hours, wait 2-3 hours and count again. Look for a change in the pattern or not enough counts in 2 hours. °SEEK MEDICAL CARE IF: °· You feel less than 10 counts in 2 hours, tried twice. °· There is no movement in over an hour. °· The pattern is changing or taking longer each day to reach 10 counts in 2 hours. °· You feel the baby is not moving as he or she usually does. °Date: ____________ Movements: ____________ Start time: ____________ Finish time: ____________  °Date: ____________ Movements: ____________ Start time: ____________ Finish time: ____________ °Date: ____________ Movements: ____________ Start time: ____________ Finish time: ____________ °Date: ____________ Movements: ____________ Start time: ____________ Finish time: ____________ °Date: ____________ Movements: ____________ Start time: ____________ Finish time: ____________ °Date: ____________ Movements: ____________ Start time: ____________ Finish time: ____________ °Date: ____________ Movements: ____________ Start time: ____________ Finish time: ____________ °Date: ____________ Movements: ____________ Start time: ____________ Finish time: ____________  °Date: ____________ Movements: ____________ Start time:  ____________ Finish time: ____________ °Date: ____________ Movements: ____________ Start time: ____________ Finish time: ____________ °Date: ____________ Movements: ____________ Start time: ____________ Finish time: ____________ °Date: ____________ Movements: ____________ Start time: ____________ Finish time: ____________ °Date: ____________ Movements: ____________ Start time: ____________ Finish time: ____________ °Date: ____________ Movements: ____________ Start time: ____________ Finish time: ____________ °Date: ____________ Movements: ____________ Start time: ____________ Finish time: ____________  °Date: ____________ Movements: ____________ Start time: ____________ Finish time: ____________ °Date: ____________ Movements: ____________ Start time: ____________ Finish time: ____________ °Date: ____________ Movements: ____________ Start time: ____________ Finish time: ____________ °Date: ____________ Movements: ____________ Start time: ____________ Finish time: ____________ °Date: ____________ Movements: ____________ Start time: ____________ Finish time: ____________ °Date: ____________ Movements: ____________ Start time: ____________ Finish time: ____________ °Date: ____________ Movements: ____________ Start time: ____________ Finish time:   ____________  °Date: ____________ Movements: ____________ Start time: ____________ Finish time: ____________ °Date: ____________ Movements: ____________ Start time: ____________ Finish time: ____________ °Date: ____________ Movements: ____________ Start time: ____________ Finish time: ____________ °Date: ____________ Movements: ____________ Start time: ____________ Finish time: ____________ °Date: ____________ Movements: ____________ Start time: ____________ Finish time: ____________ °Date: ____________ Movements: ____________ Start time: ____________ Finish time: ____________ °Date: ____________ Movements: ____________ Start time: ____________ Finish time: ____________  °Date:  ____________ Movements: ____________ Start time: ____________ Finish time: ____________ °Date: ____________ Movements: ____________ Start time: ____________ Finish time: ____________ °Date: ____________ Movements: ____________ Start time: ____________ Finish time: ____________ °Date: ____________ Movements: ____________ Start time: ____________ Finish time: ____________ °Date: ____________ Movements: ____________ Start time: ____________ Finish time: ____________ °Date: ____________ Movements: ____________ Start time: ____________ Finish time: ____________ °Date: ____________ Movements: ____________ Start time: ____________ Finish time: ____________  °Date: ____________ Movements: ____________ Start time: ____________ Finish time: ____________ °Date: ____________ Movements: ____________ Start time: ____________ Finish time: ____________ °Date: ____________ Movements: ____________ Start time: ____________ Finish time: ____________ °Date: ____________ Movements: ____________ Start time: ____________ Finish time: ____________ °Date: ____________ Movements: ____________ Start time: ____________ Finish time: ____________ °Date: ____________ Movements: ____________ Start time: ____________ Finish time: ____________ °Date: ____________ Movements: ____________ Start time: ____________ Finish time: ____________  °Date: ____________ Movements: ____________ Start time: ____________ Finish time: ____________ °Date: ____________ Movements: ____________ Start time: ____________ Finish time: ____________ °Date: ____________ Movements: ____________ Start time: ____________ Finish time: ____________ °Date: ____________ Movements: ____________ Start time: ____________ Finish time: ____________ °Date: ____________ Movements: ____________ Start time: ____________ Finish time: ____________ °Date: ____________ Movements: ____________ Start time: ____________ Finish time: ____________ °Date: ____________ Movements: ____________ Start  time: ____________ Finish time: ____________  °Date: ____________ Movements: ____________ Start time: ____________ Finish time: ____________ °Date: ____________ Movements: ____________ Start time: ____________ Finish time: ____________ °Date: ____________ Movements: ____________ Start time: ____________ Finish time: ____________ °Date: ____________ Movements: ____________ Start time: ____________ Finish time: ____________ °Date: ____________ Movements: ____________ Start time: ____________ Finish time: ____________ °Date: ____________ Movements: ____________ Start time: ____________ Finish time: ____________ °Document Released: 01/31/2006 Document Revised: 05/18/2013 Document Reviewed: 10/29/2011 °[employer]® Patient Information ©2015 [employer]. This information is not intended to replace advice given to you by your health care provider. Make sure you discuss any questions you have with your health care provider. °Braxton Hicks Contractions °Contractions of the uterus can occur throughout pregnancy. Contractions are not always a sign that you are in labor.  °WHAT ARE BRAXTON HICKS CONTRACTIONS?  °Contractions that occur before labor are called Braxton Hicks contractions, or false labor. Toward the end of pregnancy (32-34 weeks), these contractions can develop more often and may become more forceful. This is not true labor because these contractions do not result in opening (dilatation) and thinning of the cervix. They are sometimes difficult to tell apart from true labor because these contractions can be forceful and people have different pain tolerances. You should not feel embarrassed if you go to the hospital with false labor. Sometimes, the only way to tell if you are in true labor is for your health care provider to look for changes in the cervix. °If there are no prenatal problems or other health problems associated with the pregnancy, it is completely safe to be sent home with false labor and await the  onset of true labor. °HOW CAN YOU TELL THE DIFFERENCE BETWEEN TRUE AND FALSE LABOR? °False Labor °· The contractions of false labor are usually shorter and not as hard as those of true labor.   °· The contractions   are usually irregular.   °· The contractions are often felt in the front of the lower abdomen and in the groin.   °· The contractions may go away when you walk around or change positions while lying down.   °· The contractions get weaker and are shorter lasting as time goes on.   °· The contractions do not usually become progressively stronger, regular, and closer together as with true labor.   °True Labor °· Contractions in true labor last 30-70 seconds, become very regular, usually become more intense, and increase in frequency.   °· The contractions do not go away with walking.   °· The discomfort is usually felt in the top of the uterus and spreads to the lower abdomen and low back.   °· True labor can be determined by your health care provider with an exam. This will show that the cervix is dilating and getting thinner.   °WHAT TO REMEMBER °· Keep up with your usual exercises and follow other instructions given by your health care provider.   °· Take medicines as directed by your health care provider.   °· Keep your regular prenatal appointments.   °· Eat and drink lightly if you think you are going into labor.   °· If Braxton Hicks contractions are making you uncomfortable:   °· Change your position from lying down or resting to walking, or from walking to resting.   °· Sit and rest in a tub of warm water.   °· Drink 2-3 glasses of water. Dehydration may cause these contractions.   °· Do slow and deep breathing several times an hour.   °WHEN SHOULD I SEEK IMMEDIATE MEDICAL CARE? °Seek immediate medical care if: °· Your contractions become stronger, more regular, and closer together.   °· You have fluid leaking or gushing from your vagina.   °· You have a fever.   °· You pass blood-tinged mucus.    °· You have vaginal bleeding.   °· You have continuous abdominal pain.   °· You have low back pain that you never had before.   °· You feel your baby's head pushing down and causing pelvic pressure.   °· Your baby is not moving as much as it used to.   °Document Released: 01/01/2005 Document Revised: 01/06/2013 Document Reviewed: 10/13/2012 °[employer]® Patient Information ©2015 [employer]. This information is not intended to replace advice given to you by your health care provider. Make sure you discuss any questions you have with your health care provider. ° °

## 2013-12-13 NOTE — [facility] Note (Signed)
Pt presents to [facility] with complaints of blurred vision and high blood pressure. Pt reports to was standing in [employer] and her vision got blurry and her blood pressure has been going up at her doctor's visits

## 2013-12-13 NOTE — MAU Provider Note (Signed)
History     CSN: [id number]  Arrival date and time: 12/13/13 1558   First Provider Initiated Contact with Patient 12/13/13 1622      Chief Complaint  Patient presents with  . Blurred Vision  . Hypertension   HPI  Patient is 33 y.o. [id number] 7480w3d here with complaints of blurry vision.  Patient was standing in [employer] earlier today when she suddenly felt dizzy and her peripheral vision became blurry (looked like looking through water).  This was associated with nausea and flushing.  She sat down and tried to check her BP, but the cuff was too small.  Readings were 142/83 around her elbow and 126/95 below the elbow.  Her symptoms resolved after sitting for 10 minutes and she was able to finish her shopping.  After returning home, she started to worry about pre-eclampsia (h/o pre-eclampsia in previous pregnancy) so [facility] on facebook who advised she come to the MAU.  She has also had a HA for a few days associated with her bronchitis.  It is mild and intermittent, but not relieved by tylenol.  Has had more puffiness in hands and feet since driving [place] from [place].    +FM, denies LOF, VB, contractions, vaginal discharge.     Past Medical History  Diagnosis Date  . MS (multiple sclerosis)   . Hyperlipidemia   . GERD (gastroesophageal reflux disease)   . Psoriasis   . Infertility associated with anovulation   . Anxiety   . Pregnancy induced hypertension   . Migraines     Past Surgical History  Procedure Laterality Date  . Dilation and curettage of uterus  8/10  . Dilation and evacuation  12/29/2011    Procedure: DILATATION AND EVACUATION;  Surgeon: Catalina AntiguaPeggy Constant, MD;  Location: [facility];  Service: Gynecology;;  Dr, Emelda FearFerguson transferred case to Dr. Jolayne Pantheronstant    Family History  Problem Relation Age of Onset  . Heart attack Maternal Grandfather   . Cancer Maternal Grandmother     esophagus    History  Substance Use Topics  . Smoking status: Former  Smoker -- 1.00 packs/day for 15 years    Types: Cigarettes    Quit date: 07/02/2010  . Smokeless tobacco: Never Used  . Alcohol Use: No     Comment: occassion - not while pregnant    Allergies: No Known Allergies  Facility-administered medications prior to admission  Medication Dose Route Frequency Provider Last Rate Last Dose  . hydroxyprogesterone caproate (DELALUTIN) 250 mg/mL injection 250 mg  250 mg Intramuscular Weekly Lesly DukesKelly H Leggett, MD   250 mg at 11/12/13 1035  . Influenza vac split quadrivalent PF (FLUARIX) injection 0.5 mL  0.5 mL Intramuscular Once Allie BossierMyra C Dove, MD       Prescriptions prior to admission  Medication Sig Dispense Refill Last Dose  . acetaminophen (TYLENOL) 325 MG tablet Take 325 mg by mouth every 6 (six) hours as needed for headache.   12/13/2013 at Unknown time  . aspirin 81 MG tablet Take 81 mg by mouth daily.   12/13/2013 at Unknown time  . guaiFENesin (ROBITUSSIN) 100 MG/5ML liquid Take 200 mg by mouth 3 (three) times daily as needed for cough.   Past Week at Unknown time  . HYDROcodone-acetaminophen (NORCO/VICODIN) 5-325 MG per tablet Take 0.5 tablets by mouth every 4 (four) hours as needed (cough/pain).   0 Past Week at Unknown time  . prenatal vitamin w/FE, FA (PRENATAL 1 + 1) 27-1 MG TABS tablet  Take one tablet by mouth daily 30 each 11 12/12/2013 at Unknown time  . ranitidine (ZANTAC) 150 MG tablet Take 150 mg by mouth daily as needed. indigestion   12/13/2013 at Unknown time  . hydrocortisone (ANUSOL-HC) 2.5 % rectal cream Place 1 application rectally 2 (two) times daily. (Patient not taking: Reported on 12/13/2013) 30 g 0 Taking    Review of Systems  Constitutional: Negative for fever and chills.  Respiratory: Positive for cough. Negative for shortness of breath and wheezing.   Cardiovascular: Positive for leg swelling. Negative for chest pain.  Gastrointestinal: Positive for nausea. Negative for heartburn, vomiting, abdominal pain, diarrhea and  constipation.  Genitourinary: Negative for dysuria, urgency and frequency.  Neurological: Positive for headaches.   Physical Exam   Blood pressure 129/80, pulse 96, not currently breastfeeding.  Physical Exam  Constitutional: She is oriented to person, place, and time. She appears well-developed and well-nourished. No distress.  HENT:  Head: Normocephalic and atraumatic.  Cardiovascular: Normal rate and intact distal pulses.   Respiratory: Effort normal. No respiratory distress.  GI: Soft. There is no tenderness. There is no rebound and no guarding.  Musculoskeletal: She exhibits no edema or tenderness.  Neurological: She is alert and oriented to person, place, and time.  Skin: Skin is warm and dry. She is not diaphoretic.    MAU Course  Procedures  MDM FHM: Baseline 135, mod variability, many accels, no decels No contractions on toco BP normotensive  Assessment and Plan  Patient is 33 y.o. [id number] [redacted]w[redacted]d reporting blurry vision likely secondary to pre-syncope in setting of decreased PO intake with bronchitis. HA improved with fioricet, likely related to URI/dehydration.  BP normotensive while in MAU, not currently concerned for pre-eclampsia. - fetal kick counts reinforced - preterm labor precautions - push PO fluid intake - f/u tomorrow in [facility] as previously scheduled   Shirlee Latch 12/13/2013, 4:49 PM   Evaluation and management procedures were performed by Resident physician under my supervision/collaboration. Chart reviewed, patient examined by me and I agree with management and plan.

## 2013-12-14 ENCOUNTER — Other Ambulatory Visit (HOSPITAL_COMMUNITY): Payer: Self-pay | Admitting: Maternal and Fetal Medicine

## 2013-12-14 ENCOUNTER — Ambulatory Visit (INDEPENDENT_AMBULATORY_CARE_PROVIDER_SITE_OTHER): Payer: BC Managed Care – PPO | Admitting: Obstetrics & Gynecology

## 2013-12-14 VITALS — BP 118/67 | HR 98 | Wt 266.0 lb

## 2013-12-14 DIAGNOSIS — G35 Multiple sclerosis: Secondary | ICD-10-CM

## 2013-12-14 DIAGNOSIS — O10913 Unspecified pre-existing hypertension complicating pregnancy, third trimester: Secondary | ICD-10-CM | POA: Diagnosis not present

## 2013-12-14 DIAGNOSIS — O09213 Supervision of pregnancy with history of pre-term labor, third trimester: Secondary | ICD-10-CM

## 2013-12-14 DIAGNOSIS — E669 Obesity, unspecified: Secondary | ICD-10-CM

## 2013-12-14 DIAGNOSIS — Z8751 Personal history of pre-term labor: Secondary | ICD-10-CM

## 2013-12-14 LAB — CBC
HEMATOCRIT: 36.9 % (ref 36.0–46.0)
HEMOGLOBIN: 12.6 g/dL (ref 12.0–15.0)
MCH: 31.2 pg (ref 26.0–34.0)
MCHC: 34.1 g/dL (ref 30.0–36.0)
MCV: 91.3 fL (ref 78.0–100.0)
MPV: 10.2 fL (ref 9.4–12.4)
Platelets: 168 10*3/uL (ref 150–400)
RBC: 4.04 MIL/uL (ref 3.87–5.11)
RDW: 14.4 % (ref 11.5–15.5)
WBC: 8.4 10*3/uL (ref 4.0–10.5)

## 2013-12-14 MED ORDER — AZITHROMYCIN 250 MG PO TABS: ORAL_TABLET | ORAL | Status: DC

## 2013-12-14 NOTE — Progress Notes (Signed)
CBG 124 

## 2013-12-14 NOTE — Progress Notes (Signed)
Pt has continued URI symptoms x9 days.  No wheezing, no fever.  Lungs CTAB.   Will treat with z pack.  [facility] Continue [facility] for growth. Labs today. NST yesterday Category 1

## 2013-12-15 ENCOUNTER — Encounter: Payer: BC Managed Care – PPO | Admitting: Physician Assistant

## 2013-12-15 LAB — PROTEIN / CREATININE RATIO, URINE
CREATININE, URINE: 103 mg/dL
Protein Creatinine Ratio: 0.53 — ABNORMAL HIGH (ref <0.15)
TOTAL PROTEIN, URINE: 55 mg/dL — AB (ref 5–24)

## 2013-12-15 LAB — COMPREHENSIVE METABOLIC PANEL
ALT: 14 U/L (ref 0–35)
AST: 19 U/L (ref 0–37)
Albumin: 3.1 g/dL — ABNORMAL LOW (ref 3.5–5.2)
Alkaline Phosphatase: 81 U/L (ref 39–117)
BILIRUBIN TOTAL: 0.3 mg/dL (ref 0.2–1.2)
BUN: 4 mg/dL — AB (ref 6–23)
CALCIUM: 8.6 mg/dL (ref 8.4–10.5)
CHLORIDE: 103 meq/L (ref 96–112)
CO2: 23 meq/L (ref 19–32)
CREATININE: 0.41 mg/dL — AB (ref 0.50–1.10)
GLUCOSE: 111 mg/dL — AB (ref 70–99)
Potassium: 3.5 mEq/L (ref 3.5–5.3)
Sodium: 136 mEq/L (ref 135–145)
Total Protein: 6 g/dL (ref 6.0–8.3)

## 2013-12-16 ENCOUNTER — Telehealth: Payer: Self-pay | Admitting: *Deleted

## 2013-12-16 NOTE — Telephone Encounter (Signed)
Pt notified of normal labs and no signs of pre-ecclampsia @ this time.

## 2013-12-18 ENCOUNTER — Ambulatory Visit (INDEPENDENT_AMBULATORY_CARE_PROVIDER_SITE_OTHER): Payer: BC Managed Care – PPO | Admitting: Family

## 2013-12-18 VITALS — BP 131/76 | HR 94 | Wt 268.0 lb

## 2013-12-18 DIAGNOSIS — O10913 Unspecified pre-existing hypertension complicating pregnancy, third trimester: Secondary | ICD-10-CM

## 2013-12-18 DIAGNOSIS — Z3483 Encounter for supervision of other normal pregnancy, third trimester: Secondary | ICD-10-CM

## 2013-12-18 NOTE — Progress Notes (Signed)
NST only - Category I

## 2013-12-21 ENCOUNTER — Other Ambulatory Visit (HOSPITAL_COMMUNITY): Payer: Self-pay | Admitting: Maternal and Fetal Medicine

## 2013-12-21 DIAGNOSIS — E669 Obesity, unspecified: Secondary | ICD-10-CM

## 2013-12-21 DIAGNOSIS — G35 Multiple sclerosis: Secondary | ICD-10-CM

## 2013-12-21 DIAGNOSIS — Z8751 Personal history of pre-term labor: Secondary | ICD-10-CM

## 2013-12-22 ENCOUNTER — Ambulatory Visit (HOSPITAL_COMMUNITY)
Admission: RE | Admit: 2013-12-22 | Discharge: 2013-12-22 | Disposition: A | Discharge status: Home / Self Care | Payer: BC Managed Care – PPO | Source: Ambulatory Visit | Attending: Obstetrics & Gynecology | Admitting: Obstetrics & Gynecology

## 2013-12-22 ENCOUNTER — Other Ambulatory Visit (HOSPITAL_COMMUNITY): Payer: Self-pay | Admitting: Maternal and Fetal Medicine

## 2013-12-22 ENCOUNTER — Encounter (HOSPITAL_COMMUNITY): Payer: Self-pay

## 2013-12-22 DIAGNOSIS — O99213 Obesity complicating pregnancy, third trimester: Secondary | ICD-10-CM | POA: Diagnosis not present

## 2013-12-22 DIAGNOSIS — G35 Multiple sclerosis: Secondary | ICD-10-CM

## 2013-12-22 DIAGNOSIS — Z8751 Personal history of pre-term labor: Secondary | ICD-10-CM

## 2013-12-22 DIAGNOSIS — O99353 Diseases of the nervous system complicating pregnancy, third trimester: Secondary | ICD-10-CM | POA: Diagnosis not present

## 2013-12-22 DIAGNOSIS — E669 Obesity, unspecified: Secondary | ICD-10-CM

## 2013-12-22 DIAGNOSIS — O09213 Supervision of pregnancy with history of pre-term labor, third trimester: Secondary | ICD-10-CM | POA: Diagnosis not present

## 2013-12-22 DIAGNOSIS — Z3A35 35 weeks gestation of pregnancy: Secondary | ICD-10-CM | POA: Diagnosis not present

## 2013-12-24 ENCOUNTER — Telehealth: Payer: Self-pay | Admitting: *Deleted

## 2013-12-24 ENCOUNTER — Encounter: Payer: Self-pay | Admitting: Obstetrics & Gynecology

## 2013-12-24 NOTE — Telephone Encounter (Signed)
-----   Message from Lesly Dukes, MD sent at 12/24/2013  5:58 AM EST ----- Macrosomia.  Rpt [place] just prior to induction.

## 2013-12-25 ENCOUNTER — Other Ambulatory Visit: Payer: BC Managed Care – PPO

## 2013-12-29 ENCOUNTER — Ambulatory Visit (HOSPITAL_COMMUNITY)
Admission: RE | Admit: 2013-12-29 | Discharge: 2013-12-29 | Disposition: A | Discharge status: Home / Self Care | Payer: BC Managed Care – PPO | Source: Ambulatory Visit | Attending: Obstetrics & Gynecology | Admitting: Obstetrics & Gynecology

## 2013-12-29 ENCOUNTER — Encounter (HOSPITAL_COMMUNITY): Payer: Self-pay

## 2013-12-29 ENCOUNTER — Ambulatory Visit (HOSPITAL_COMMUNITY): Admission: RE | Admit: 2013-12-29 | Payer: BC Managed Care – PPO | Source: Ambulatory Visit

## 2013-12-29 ENCOUNTER — Other Ambulatory Visit (HOSPITAL_COMMUNITY): Payer: Self-pay | Admitting: Maternal and Fetal Medicine

## 2013-12-29 DIAGNOSIS — O09813 Supervision of pregnancy resulting from assisted reproductive technology, third trimester: Secondary | ICD-10-CM | POA: Insufficient documentation

## 2013-12-29 DIAGNOSIS — Z3A36 36 weeks gestation of pregnancy: Secondary | ICD-10-CM | POA: Insufficient documentation

## 2013-12-29 DIAGNOSIS — O10213 Pre-existing hypertensive chronic kidney disease complicating pregnancy, third trimester: Secondary | ICD-10-CM | POA: Insufficient documentation

## 2013-12-29 DIAGNOSIS — O09213 Supervision of pregnancy with history of pre-term labor, third trimester: Secondary | ICD-10-CM | POA: Diagnosis not present

## 2013-12-29 DIAGNOSIS — Z8751 Personal history of pre-term labor: Secondary | ICD-10-CM

## 2013-12-29 DIAGNOSIS — I129 Hypertensive chronic kidney disease with stage 1 through stage 4 chronic kidney disease, or unspecified chronic kidney disease: Secondary | ICD-10-CM | POA: Diagnosis not present

## 2013-12-29 DIAGNOSIS — O99213 Obesity complicating pregnancy, third trimester: Secondary | ICD-10-CM | POA: Insufficient documentation

## 2013-12-29 DIAGNOSIS — G35 Multiple sclerosis: Secondary | ICD-10-CM

## 2013-12-29 DIAGNOSIS — E669 Obesity, unspecified: Secondary | ICD-10-CM

## 2013-12-29 DIAGNOSIS — N189 Chronic kidney disease, unspecified: Secondary | ICD-10-CM | POA: Diagnosis not present

## 2013-12-29 DIAGNOSIS — O10919 Unspecified pre-existing hypertension complicating pregnancy, unspecified trimester: Secondary | ICD-10-CM | POA: Insufficient documentation

## 2013-12-29 DIAGNOSIS — O10013 Pre-existing essential hypertension complicating pregnancy, third trimester: Secondary | ICD-10-CM | POA: Diagnosis present

## 2013-12-29 DIAGNOSIS — O3663X0 Maternal care for excessive fetal growth, third trimester, not applicable or unspecified: Secondary | ICD-10-CM | POA: Insufficient documentation

## 2013-12-31 ENCOUNTER — Ambulatory Visit (INDEPENDENT_AMBULATORY_CARE_PROVIDER_SITE_OTHER): Payer: BC Managed Care – PPO | Admitting: Obstetrics & Gynecology

## 2013-12-31 VITALS — BP 146/84 | HR 80 | Wt 281.0 lb

## 2013-12-31 DIAGNOSIS — O1403 Mild to moderate pre-eclampsia, third trimester: Secondary | ICD-10-CM | POA: Diagnosis not present

## 2013-12-31 DIAGNOSIS — R51 Headache: Secondary | ICD-10-CM

## 2013-12-31 DIAGNOSIS — O2603 Excessive weight gain in pregnancy, third trimester: Secondary | ICD-10-CM | POA: Diagnosis not present

## 2013-12-31 DIAGNOSIS — Z3483 Encounter for supervision of other normal pregnancy, third trimester: Secondary | ICD-10-CM

## 2013-12-31 DIAGNOSIS — R519 Headache, unspecified: Secondary | ICD-10-CM

## 2013-12-31 LAB — CBC
HCT: 35 % — ABNORMAL LOW (ref 36.0–46.0)
Hemoglobin: 11.9 g/dL — ABNORMAL LOW (ref 12.0–15.0)
MCH: 31 pg (ref 26.0–34.0)
MCHC: 34 g/dL (ref 30.0–36.0)
MCV: 91.2 fL (ref 78.0–100.0)
Platelets: 154 10*3/uL (ref 150–400)
RBC: 3.84 MIL/uL — ABNORMAL LOW (ref 3.87–5.11)
RDW: 13.7 % (ref 11.5–15.5)
WBC: 9.8 10*3/uL (ref 4.0–10.5)

## 2013-12-31 LAB — PROTEIN / CREATININE RATIO, URINE
Creatinine, Urine: 68.7 mg/dL
Protein Creatinine Ratio: 1.3 — ABNORMAL HIGH (ref <0.15)
Total Protein, Urine: 89 mg/dL — ABNORMAL HIGH (ref 5–24)

## 2013-12-31 LAB — COMPREHENSIVE METABOLIC PANEL
ALT: 17 U/L (ref 0–35)
AST: 22 U/L (ref 0–37)
Albumin: 3.1 g/dL — ABNORMAL LOW (ref 3.5–5.2)
Alkaline Phosphatase: 95 U/L (ref 39–117)
BILIRUBIN TOTAL: 0.2 mg/dL (ref 0.2–1.2)
BUN: 5 mg/dL — AB (ref 6–23)
CO2: 26 meq/L (ref 19–32)
Calcium: 8.6 mg/dL (ref 8.4–10.5)
Chloride: 104 mEq/L (ref 96–112)
Creat: 0.43 mg/dL — ABNORMAL LOW (ref 0.50–1.10)
GLUCOSE: 73 mg/dL (ref 70–99)
Potassium: 3.6 mEq/L (ref 3.5–5.3)
Sodium: 138 mEq/L (ref 135–145)
TOTAL PROTEIN: 5.7 g/dL — AB (ref 6.0–8.3)

## 2013-12-31 LAB — GLUCOSE, POCT (MANUAL RESULT ENTRY): POC GLUCOSE: 63 mg/dL — AB (ref 70–99)

## 2013-12-31 MED ORDER — BUTALBITAL-APAP-CAFFEINE 50-325-40 MG PO TABS: 1.0000 | ORAL_TABLET | Freq: Four times a day (QID) | ORAL | Status: DC | PRN

## 2013-12-31 NOTE — Progress Notes (Signed)
Pt has mild headache.  Pt has history of headaches.  Ran out of fioricet.  Pt BP elevated by not marked.  She had mild C HTN and baseline proteinuria.  Need labs, and stat protein creatinine ratio.  Pt has had 12 pound weight gain over 2 weeks.   If preeclampsia then will induce.  Pt to call if fioricet doesn't relieve headache.  Will call pts with results and plan.

## 2014-01-01 ENCOUNTER — Telehealth: Payer: Self-pay | Admitting: *Deleted

## 2014-01-01 ENCOUNTER — Inpatient Hospital Stay (HOSPITAL_COMMUNITY)
Admission: AD | Admit: 2014-01-01 | Discharge: 2014-01-04 | DRG: 774 | Disposition: A | Discharge status: Home / Self Care | Payer: BC Managed Care – PPO | Source: Ambulatory Visit | Attending: Family Medicine | Admitting: Family Medicine

## 2014-01-01 ENCOUNTER — Encounter (HOSPITAL_COMMUNITY): Payer: Self-pay | Admitting: *Deleted

## 2014-01-01 DIAGNOSIS — E785 Hyperlipidemia, unspecified: Secondary | ICD-10-CM | POA: Diagnosis present

## 2014-01-01 DIAGNOSIS — Z6841 Body Mass Index (BMI) 40.0 and over, adult: Secondary | ICD-10-CM | POA: Diagnosis not present

## 2014-01-01 DIAGNOSIS — N979 Female infertility, unspecified: Secondary | ICD-10-CM | POA: Diagnosis present

## 2014-01-01 DIAGNOSIS — K219 Gastro-esophageal reflux disease without esophagitis: Secondary | ICD-10-CM | POA: Diagnosis present

## 2014-01-01 DIAGNOSIS — O1403 Mild to moderate pre-eclampsia, third trimester: Principal | ICD-10-CM | POA: Diagnosis present

## 2014-01-01 DIAGNOSIS — O09211 Supervision of pregnancy with history of pre-term labor, first trimester: Secondary | ICD-10-CM

## 2014-01-01 DIAGNOSIS — O99344 Other mental disorders complicating childbirth: Secondary | ICD-10-CM | POA: Diagnosis present

## 2014-01-01 DIAGNOSIS — O99214 Obesity complicating childbirth: Secondary | ICD-10-CM | POA: Diagnosis present

## 2014-01-01 DIAGNOSIS — O3663X0 Maternal care for excessive fetal growth, third trimester, not applicable or unspecified: Secondary | ICD-10-CM | POA: Diagnosis present

## 2014-01-01 DIAGNOSIS — F419 Anxiety disorder, unspecified: Secondary | ICD-10-CM | POA: Diagnosis present

## 2014-01-01 DIAGNOSIS — O9882 Other maternal infectious and parasitic diseases complicating childbirth: Secondary | ICD-10-CM | POA: Diagnosis not present

## 2014-01-01 DIAGNOSIS — O99284 Endocrine, nutritional and metabolic diseases complicating childbirth: Secondary | ICD-10-CM | POA: Diagnosis present

## 2014-01-01 DIAGNOSIS — O9962 Diseases of the digestive system complicating childbirth: Secondary | ICD-10-CM | POA: Diagnosis present

## 2014-01-01 DIAGNOSIS — E669 Obesity, unspecified: Secondary | ICD-10-CM | POA: Diagnosis present

## 2014-01-01 DIAGNOSIS — Z3A32 32 weeks gestation of pregnancy: Secondary | ICD-10-CM | POA: Diagnosis present

## 2014-01-01 DIAGNOSIS — O99824 Streptococcus B carrier state complicating childbirth: Secondary | ICD-10-CM | POA: Diagnosis present

## 2014-01-01 DIAGNOSIS — Z3A37 37 weeks gestation of pregnancy: Secondary | ICD-10-CM | POA: Diagnosis not present

## 2014-01-01 DIAGNOSIS — O10912 Unspecified pre-existing hypertension complicating pregnancy, second trimester: Secondary | ICD-10-CM

## 2014-01-01 DIAGNOSIS — Z3483 Encounter for supervision of other normal pregnancy, third trimester: Secondary | ICD-10-CM | POA: Diagnosis present

## 2014-01-01 DIAGNOSIS — O09891 Supervision of other high risk pregnancies, first trimester: Secondary | ICD-10-CM

## 2014-01-01 LAB — PROTEIN / CREATININE RATIO, URINE
CREATININE, URINE: 211.17 mg/dL
Protein Creatinine Ratio: 0.93 — ABNORMAL HIGH (ref 0.00–0.15)
Total Protein, Urine: 196.5 mg/dL

## 2014-01-01 LAB — COMPREHENSIVE METABOLIC PANEL
ALBUMIN: 2.3 g/dL — AB (ref 3.5–5.2)
ALT: 16 U/L (ref 0–35)
AST: 22 U/L (ref 0–37)
Alkaline Phosphatase: 95 U/L (ref 39–117)
Anion gap: 11 (ref 5–15)
BUN: 7 mg/dL (ref 6–23)
CO2: 23 mEq/L (ref 19–32)
Calcium: 8.6 mg/dL (ref 8.4–10.5)
Chloride: 103 mEq/L (ref 96–112)
Creatinine, Ser: 0.55 mg/dL (ref 0.50–1.10)
GFR calc Af Amer: >90 mL/min (ref >90)
GFR calc non Af Amer: >90 mL/min (ref >90)
Glucose, Bld: 98 mg/dL (ref 70–99)
Potassium: 3.5 mEq/L — ABNORMAL LOW (ref 3.7–5.3)
Sodium: 137 mEq/L (ref 137–147)
TOTAL PROTEIN: 6 g/dL (ref 6.0–8.3)
Total Bilirubin: <0.2 mg/dL — ABNORMAL LOW (ref 0.3–1.2)

## 2014-01-01 LAB — CBC
HEMATOCRIT: 33.7 % — AB (ref 36.0–46.0)
HEMOGLOBIN: 11.6 g/dL — AB (ref 12.0–15.0)
MCH: 31.5 pg (ref 26.0–34.0)
MCHC: 34.4 g/dL (ref 30.0–36.0)
MCV: 91.6 fL (ref 78.0–100.0)
Platelets: 138 10*3/uL — ABNORMAL LOW (ref 150–400)
RBC: 3.68 MIL/uL — AB (ref 3.87–5.11)
RDW: 13.1 % (ref 11.5–15.5)
WBC: 8.3 10*3/uL (ref 4.0–10.5)

## 2014-01-01 LAB — PREPARE RBC (CROSSMATCH)

## 2014-01-01 LAB — HIV ANTIBODY (ROUTINE TESTING W REFLEX): HIV 1&2 Ab, 4th Generation: NONREACTIVE

## 2014-01-01 LAB — RPR

## 2014-01-01 MED ORDER — CITRIC ACID-SODIUM CITRATE 334-500 MG/5ML PO SOLN
30.0000 mL | ORAL | Status: DC | PRN
  Administered 2014-01-01: 30 mL via ORAL
  Filled 2014-01-01 (×2): qty 15

## 2014-01-01 MED ORDER — OXYTOCIN 40 UNITS IN LACTATED RINGERS INFUSION - SIMPLE MED: 62.5000 mL/h | INTRAVENOUS | Status: DC

## 2014-01-01 MED ORDER — ONDANSETRON HCL 4 MG/2ML IJ SOLN
4.0000 mg | Freq: Four times a day (QID) | INTRAMUSCULAR | Status: DC | PRN
  Administered 2014-01-03: 4 mg via INTRAVENOUS
  Filled 2014-01-01: qty 2

## 2014-01-01 MED ORDER — LACTATED RINGERS IV SOLN: 500.0000 mL | INTRAVENOUS | Status: DC | PRN

## 2014-01-01 MED ORDER — SODIUM CHLORIDE 0.9 % IV SOLN: Freq: Once | INTRAVENOUS | Status: DC

## 2014-01-01 MED ORDER — OXYCODONE-ACETAMINOPHEN 5-325 MG PO TABS: 1.0000 | ORAL_TABLET | ORAL | Status: DC | PRN

## 2014-01-01 MED ORDER — ACETAMINOPHEN 325 MG PO TABS: 650.0000 mg | ORAL_TABLET | ORAL | Status: DC | PRN

## 2014-01-01 MED ORDER — PENICILLIN G POTASSIUM 5000000 UNITS IJ SOLR
5.0000 10*6.[IU] | Freq: Once | INTRAVENOUS | Status: DC
  Filled 2014-01-01: qty 5

## 2014-01-01 MED ORDER — OXYTOCIN BOLUS FROM INFUSION
500.0000 mL | INTRAVENOUS | Status: DC
  Administered 2014-01-03: 500 mL via INTRAVENOUS

## 2014-01-01 MED ORDER — DEXTROSE 5 % IV SOLN
2.5000 10*6.[IU] | INTRAVENOUS | Status: DC
  Filled 2014-01-01 (×2): qty 2.5

## 2014-01-01 MED ORDER — MISOPROSTOL 50MCG HALF TABLET
50.0000 ug | ORAL_TABLET | ORAL | Status: DC
  Administered 2014-01-01 – 2014-01-02 (×3): 50 ug via ORAL
  Filled 2014-01-01 (×17): qty 1

## 2014-01-01 MED ORDER — OXYCODONE-ACETAMINOPHEN 5-325 MG PO TABS: 2.0000 | ORAL_TABLET | ORAL | Status: DC | PRN

## 2014-01-01 MED ORDER — BUTALBITAL-APAP-CAFFEINE 50-325-40 MG PO TABS
1.0000 | ORAL_TABLET | Freq: Four times a day (QID) | ORAL | Status: DC | PRN
  Administered 2014-01-02 (×3): 1 via ORAL
  Filled 2014-01-01 (×3): qty 1

## 2014-01-01 MED ORDER — LIDOCAINE HCL (PF) 1 % IJ SOLN
30.0000 mL | INTRAMUSCULAR | Status: DC | PRN
  Filled 2014-01-01: qty 30

## 2014-01-01 MED ORDER — LACTATED RINGERS IV SOLN
INTRAVENOUS | Status: DC
  Administered 2014-01-01 – 2014-01-03 (×4): via INTRAVENOUS

## 2014-01-01 MED ORDER — PENICILLIN G POTASSIUM 5000000 UNITS IJ SOLR
2.5000 10*6.[IU] | INTRAVENOUS | Status: DC
  Administered 2014-01-02 – 2014-01-03 (×6): 2.5 10*6.[IU] via INTRAVENOUS
  Filled 2014-01-01 (×11): qty 2.5

## 2014-01-01 MED ORDER — ZOLPIDEM TARTRATE 5 MG PO TABS
5.0000 mg | ORAL_TABLET | Freq: Every evening | ORAL | Status: DC | PRN
  Administered 2014-01-01: 5 mg via ORAL
  Filled 2014-01-01: qty 1

## 2014-01-01 MED ORDER — PENICILLIN G POTASSIUM 5000000 UNITS IJ SOLR
5.0000 10*6.[IU] | Freq: Once | INTRAVENOUS | Status: AC
  Administered 2014-01-02: 5 10*6.[IU] via INTRAVENOUS
  Filled 2014-01-01: qty 5

## 2014-01-01 MED ORDER — MISOPROSTOL 25 MCG QUARTER TABLET
25.0000 ug | ORAL_TABLET | ORAL | Status: DC
  Administered 2014-01-01: 25 ug via ORAL
  Filled 2014-01-01: qty 0.25

## 2014-01-01 NOTE — Telephone Encounter (Signed)
Pt notified of abnormal labs and per Dr Penne LashLeggett induction scheduled today @ 2:00 as direct admit.

## 2014-01-01 NOTE — H&P (Addendum)
Charlene [name] is a 33 y.o. female presenting for induction for mild preeclampsia.  Pt has mild CHTN and baseline proteinuria.  She has done well during the pregnancy on antihypertensives.  Pt begain gaining weight the past 2 weeks and most recently complained fo facial and had edema.  Labs done yesterday showed a protein creatine ration doubling from .53 two weeks ago to 1.3 yesterday.  BP is consistently elevated to 140s systolic.  Pt is term and induction is indicated for preeclampsia (mild).  History OB History    Gravida Para Term Preterm AB TAB SAB Ectopic Multiple Living   5 1  1 3 1 2  1 2      Past Medical History  Diagnosis Date  . MS (multiple sclerosis)   . Hyperlipidemia   . GERD (gastroesophageal reflux disease)   . Psoriasis   . Infertility associated with anovulation   . Anxiety   . Pregnancy induced hypertension   . Migraines    Past Surgical History  Procedure Laterality Date  . Dilation and curettage of uterus  8/10  . Dilation and evacuation  12/29/2011    Procedure: DILATATION AND EVACUATION;  Surgeon: Catalina Antigua, MD;  Location: [facility];  Service: Gynecology;;  Dr, Emelda Fear transferred case to Dr. Jolayne Panther   Family History: family history includes Cancer in her maternal grandmother; Heart attack in her maternal grandfather. Social History:  reports that she quit smoking about 3 years ago. Her smoking use included Cigarettes. She has a 15 pack-year smoking history. She has never used smokeless tobacco. She reports that she does not drink alcohol or use illicit drugs.   Prenatal Transfer Tool  Maternal Diabetes: No Genetic Screening: Normal Maternal Ultrasounds/Referrals: Abnormal:  Findings:   Other: Macrosomia Fetal Ultrasounds or other Referrals:  Referred to Materal Fetal Medicine  Maternal Substance Abuse:  No Significant Maternal Medications:  Meds include: Other: fioricet, copaxone Significant Maternal Lab Results:  None Other Comments:   None  ROS  Dilation: Closed Effacement (%): Thick Station: -3 Exam by:: dr. Celene Pippins Blood pressure 146/90, pulse 83, temperature 99.2 F (37.3 C), temperature source Oral, resp. rate 18, height 5\' 6"  (1.676 m), weight 281 lb (127.461 kg). Exam Physical Exam  Prenatal labs: ABO, Rh: A/POS/-- (06/02 1123) Antibody: NEG (06/02 1123) Rubella: 6.49 (06/02 1123) RPR: NON REAC (06/02 1123)  HBsAg: NEGATIVE (06/02 1123)  HIV: NONREACTIVE (06/02 1123)  GBS:   positive  Assessment/Plan: 33 yo female with donner egg IVF term pregnancy with mild preeclampsia.  Pt has well controlled CHTN and baseline 24 hour urine protein of 550 mg.   1-Monitor for symptoms of severe preeclampsia 2-cytotec induction 3-Continue medication for MS. 4-Treat GBS    Had PPROM at 32 weeks with preeclampsia, delivered. 17P this pregnancy First trimester screen with MFM  Clinic  [facility]  Dating  IVF and early [place]  Genetic Screen 1 Screen:  neg           AFP:   neg  Anatomic [place] nml  GTT Early:     150; 3 hr:  nml     Third trimester: [phone] (one abnml value, almost 2)  TDaP vaccine 10/15  Flu vaccine 10/15  GBS GBS in urine  Contraception Infertility (uses donner egg)  Baby Food  Breast  Circumcision unsure  Pediatrician [place]  Support person--Seth Goldie Tregoning H. 01/01/2014, 6:21 PM

## 2014-01-01 NOTE — Telephone Encounter (Signed)
-----   Message from Lesly Dukes, MD sent at 01/01/2014  5:40 AM EST ----- Pt's labs consistent with preeclampsia.  Pt should come in for induction today.

## 2014-01-02 LAB — CBC
HCT: 35.3 % — ABNORMAL LOW (ref 36.0–46.0)
HEMOGLOBIN: 12 g/dL (ref 12.0–15.0)
MCH: 31.1 pg (ref 26.0–34.0)
MCHC: 34 g/dL (ref 30.0–36.0)
MCV: 91.5 fL (ref 78.0–100.0)
Platelets: 161 10*3/uL (ref 150–400)
RBC: 3.86 MIL/uL — AB (ref 3.87–5.11)
RDW: 13.3 % (ref 11.5–15.5)
WBC: 9.6 10*3/uL (ref 4.0–10.5)

## 2014-01-02 MED ORDER — FENTANYL CITRATE 0.05 MG/ML IJ SOLN
100.0000 ug | Freq: Once | INTRAMUSCULAR | Status: AC
  Administered 2014-01-02: 100 ug via INTRAVENOUS

## 2014-01-02 MED ORDER — PHENYLEPHRINE 40 MCG/ML (10ML) SYRINGE FOR IV PUSH (FOR BLOOD PRESSURE SUPPORT)
80.0000 ug | PREFILLED_SYRINGE | INTRAVENOUS | Status: DC | PRN
  Filled 2014-01-02: qty 10
  Filled 2014-01-02: qty 2
  Filled 2014-01-02: qty 10

## 2014-01-02 MED ORDER — FENTANYL CITRATE 0.05 MG/ML IJ SOLN
100.0000 ug | Freq: Once | INTRAMUSCULAR | Status: AC
  Administered 2014-01-02: 100 ug via INTRAVENOUS
  Filled 2014-01-02: qty 2

## 2014-01-02 MED ORDER — FENTANYL CITRATE 0.05 MG/ML IJ SOLN
INTRAMUSCULAR | Status: AC
  Filled 2014-01-02: qty 2

## 2014-01-02 MED ORDER — TERBUTALINE SULFATE 1 MG/ML IJ SOLN: 0.2500 mg | Freq: Once | INTRAMUSCULAR | Status: AC | PRN

## 2014-01-02 MED ORDER — FENTANYL CITRATE 0.05 MG/ML IJ SOLN
100.0000 ug | INTRAMUSCULAR | Status: DC | PRN
  Administered 2014-01-03: 100 ug via INTRAVENOUS
  Filled 2014-01-02 (×2): qty 2

## 2014-01-02 MED ORDER — EPHEDRINE 5 MG/ML INJ
10.0000 mg | INTRAVENOUS | Status: DC | PRN
  Filled 2014-01-02: qty 2

## 2014-01-02 MED ORDER — DIPHENHYDRAMINE HCL 50 MG/ML IJ SOLN: 12.5000 mg | INTRAMUSCULAR | Status: DC | PRN

## 2014-01-02 MED ORDER — FENTANYL 2.5 MCG/ML BUPIVACAINE 1/10 % EPIDURAL INFUSION (WH - ANES)
14.0000 mL/h | INTRAMUSCULAR | Status: DC | PRN
  Administered 2014-01-03 (×3): 14 mL/h via EPIDURAL
  Filled 2014-01-02 (×3): qty 125

## 2014-01-02 MED ORDER — ACETAMINOPHEN 500 MG PO TABS
500.0000 mg | ORAL_TABLET | Freq: Once | ORAL | Status: AC
  Administered 2014-01-02: 500 mg via ORAL
  Filled 2014-01-02: qty 1

## 2014-01-02 MED ORDER — PHENYLEPHRINE 40 MCG/ML (10ML) SYRINGE FOR IV PUSH (FOR BLOOD PRESSURE SUPPORT)
80.0000 ug | PREFILLED_SYRINGE | INTRAVENOUS | Status: DC | PRN
  Filled 2014-01-02: qty 2

## 2014-01-02 MED ORDER — OXYTOCIN 40 UNITS IN LACTATED RINGERS INFUSION - SIMPLE MED
1.0000 m[IU]/min | INTRAVENOUS | Status: DC
  Administered 2014-01-02: 2 m[IU]/min via INTRAVENOUS
  Filled 2014-01-02: qty 1000

## 2014-01-02 MED ORDER — LIDOCAINE-PRILOCAINE 2.5-2.5 % EX CREA
TOPICAL_CREAM | Freq: Once | CUTANEOUS | Status: AC
  Administered 2014-01-02: 22:00:00 via TOPICAL
  Filled 2014-01-02: qty 5

## 2014-01-02 MED ORDER — LACTATED RINGERS IV SOLN: 500.0000 mL | Freq: Once | INTRAVENOUS | Status: DC

## 2014-01-02 NOTE — Progress Notes (Signed)
Pt now req C/S. Joellyn HaffKim Booker, CNM notified will come to speak with pt.

## 2014-01-02 NOTE — Progress Notes (Signed)
Spoke with Dr. Janee Morn about pt cont HA. OK to offer pt additional 500mg  of tylenol. Offered this to pt she refused. Using warm compresses to forehead, lights off, pt attempting to rest.

## 2014-01-02 NOTE — Progress Notes (Signed)
Charlene [name] is a 33 y.o. [id number] at [redacted]w[redacted]d by ultrasound admitted for induction of labor due to Pre-eclamptic toxemia of pregnancy..  Subjective: Pt states she is "in significant pain because she has a very low pain tolerance" and is very anxious.    Objective: BP 152/97 mmHg  Pulse 78  Temp(Src) 97.6 F (36.4 C) (Oral)  Resp 16  Ht 5\' 6"  (1.676 m)  Wt 127.461 kg (281 lb)  BMI 45.38 kg/m2  Psych: Pt is very anxious and tearful in the room over memories of her last delivery.        FHT:  FHR: 145 bpm, variability: moderate,  accelerations:  Present,  decelerations:  Absent UC:   irregular, every 3-5 minutes SVE:   Dilation: 1 Effacement (%): 30 Station: -3 Exam by:: [employer]: Lab Results  Component Value Date   WBC 8.3 01/01/2014   HGB 11.6* 01/01/2014   HCT 33.7* 01/01/2014   MCV 91.6 01/01/2014   PLT 138* 01/01/2014    Assessment / Plan: F/B in place, continue with current plan for now, pain control with Fentanyl. Spent 20 minutes speaking to patient about her concerns with pain control.  Pt states she had a bad experience with her last delivery where she felt like her epidural did not work.  Pt was counseled to keep [place] informed about her pain level,   Labor: Induction going as planned, FB in place Preeclampsia:  no signs or symptoms of toxicity Fetal Wellbeing:  Category I Pain Control:  Fentanyl I/D:  n/a Anticipated MOD:  NSVD  Maurie Boettcher L 01/02/2014, 10:01 AM

## 2014-01-02 NOTE — Progress Notes (Signed)
Patient ID: Charlene [name], female   DOB: [birth year], 33 y.o.   MRN: [id number] Charlene Fickmily Ardoin is a 33 y.o. [id number] at 6136w2d admitted for induction of labor due to chtn w/ si pre-e.  Subjective: Frustrated b/c she's been here awhile and nothing's happening  Objective: BP 151/89 mmHg  Pulse 87  Temp(Src) 98.3 F (36.8 C) (Oral)  Resp 16  Ht 5\' 6"  (1.676 m)  Wt 127.461 kg (281 lb)  BMI 45.38 kg/m2    FHT:  FHR: 135 bpm, variability: moderate,  accelerations:  Present,  decelerations:  Absent UC:   irregular  SVE:   Dilation: 5 Effacement (%): 50 Station: -3 Exam by:: Kbooker, CNM, vtx FB sitting in vagina, removed w/o difficulty  Labs: Lab Results  Component Value Date   WBC 8.3 01/01/2014   HGB 11.6* 01/01/2014   HCT 33.7* 01/01/2014   MCV 91.6 01/01/2014   PLT 138* 01/01/2014    Assessment / Plan: IOL d/t CHTN w/ SI Pre-e, foley bulb now out, wants to shower and then will start pitocin per protocol  Labor: s/p cervical ripening Fetal Wellbeing:  Category I Pain Control:  Labor support without medications Pre-eclampsia: mild, bp's controlled I/D:  Will begin pcn w/ pitocin Anticipated MOD:  NSVD  Marge DuncansBooker, Mayur Duman Randall CNM, WHNP-BC 01/02/2014, 8:10 PM

## 2014-01-02 NOTE — Progress Notes (Signed)
Admission nutrition screen triggered for unintentional weight loss > 10 lbs within the last month. . Patients chart reviewed and assessed  for nutritional risk. Patient is determined to be at low nutrition  risk.    Charlene [name] M.Ed. R.D. LDN Neonatal Nutrition Support Specialist/RD III Pager [phone]  

## 2014-01-02 NOTE — Progress Notes (Signed)
LABOR PROGRESS NOTE  Charlene [name] is a 33 y.o. Charlene [name] at 3261w2d  admitted for induction of labor due to preeclampsia without severe features, currently not on magnesium.  Subjective: Comfortable, premedicated with 100mcg fentanyl for FB placement  Objective: BP 144/73 mmHg  Pulse 80  Temp(Src) 97.6 F (36.4 C) (Oral)  Resp 16  Ht 5\' 6"  (1.676 m)  Wt 281 lb (127.461 kg)  BMI 45.38 kg/m2 or  Filed Vitals:   01/01/14 1805 01/01/14 2101 01/02/14 0132 01/02/14 0525  BP: 146/90 148/94 142/78 144/73  Pulse: 83 79 80 80  Temp: 99.2 F (37.3 C) 98.7 F (37.1 C) 97.8 F (36.6 C) 97.6 F (36.4 C)  TempSrc: Oral Oral Oral Oral  Resp:  16 16 16   Height:      Weight:           FHT:  FHR: 140 bpm, variability: moderate,  accelerations:  Present,  decelerations:  Absent UC:   Poor tracing, reports feeling some cramping  SVE:   Dilation: 1 Effacement (%): 30 Station: -3 Exam by:: Dr.Sherrika Weakland  Dilation: 1 Effacement (%): 30 Cervical Position: Posterior Station: -3 Presentation: Vertex Exam by:: Dr.Yu Peggs   Labs: Lab Results  Component Value Date   WBC 8.3 01/01/2014   HGB 11.6* 01/01/2014   HCT 33.7* 01/01/2014   MCV 91.6 01/01/2014   PLT 138* 01/01/2014    Assessment / Plan: Induction of labor due to pree  Labor: Progressing normally s/p cytotec, FB placed at 0639 Fetal Wellbeing:  Category I Pain Control:  Labor support without medications Anticipated MOD:  NSVD  Charlene [name] [name], MD 01/02/2014, 7:22 AM

## 2014-01-03 ENCOUNTER — Encounter (HOSPITAL_COMMUNITY): Payer: Self-pay | Admitting: *Deleted

## 2014-01-03 ENCOUNTER — Inpatient Hospital Stay (HOSPITAL_COMMUNITY): Payer: BC Managed Care – PPO | Admitting: Anesthesiology

## 2014-01-03 DIAGNOSIS — Z3A37 37 weeks gestation of pregnancy: Secondary | ICD-10-CM

## 2014-01-03 DIAGNOSIS — O9882 Other maternal infectious and parasitic diseases complicating childbirth: Secondary | ICD-10-CM

## 2014-01-03 DIAGNOSIS — O1403 Mild to moderate pre-eclampsia, third trimester: Secondary | ICD-10-CM

## 2014-01-03 DIAGNOSIS — O99824 Streptococcus B carrier state complicating childbirth: Secondary | ICD-10-CM

## 2014-01-03 LAB — CBC
HEMATOCRIT: 32.4 % — AB (ref 36.0–46.0)
Hemoglobin: 11.3 g/dL — ABNORMAL LOW (ref 12.0–15.0)
MCH: 31.7 pg (ref 26.0–34.0)
MCHC: 34.9 g/dL (ref 30.0–36.0)
MCV: 91 fL (ref 78.0–100.0)
Platelets: 139 10*3/uL — ABNORMAL LOW (ref 150–400)
RBC: 3.56 MIL/uL — ABNORMAL LOW (ref 3.87–5.11)
RDW: 13.2 % (ref 11.5–15.5)
WBC: 14.1 10*3/uL — AB (ref 4.0–10.5)

## 2014-01-03 MED ORDER — PRENATAL MULTIVITAMIN CH
1.0000 | ORAL_TABLET | Freq: Every day | ORAL | Status: DC
  Administered 2014-01-04: 1 via ORAL
  Filled 2014-01-03: qty 1

## 2014-01-03 MED ORDER — SIMETHICONE 80 MG PO CHEW: 80.0000 mg | CHEWABLE_TABLET | ORAL | Status: DC | PRN

## 2014-01-03 MED ORDER — ONDANSETRON HCL 4 MG/2ML IJ SOLN: 4.0000 mg | INTRAMUSCULAR | Status: DC | PRN

## 2014-01-03 MED ORDER — WITCH HAZEL-GLYCERIN EX PADS
1.0000 "application " | MEDICATED_PAD | CUTANEOUS | Status: DC | PRN
  Administered 2014-01-03 – 2014-01-04 (×2): 1 via TOPICAL

## 2014-01-03 MED ORDER — LIDOCAINE HCL (PF) 1 % IJ SOLN
INTRAMUSCULAR | Status: DC | PRN
  Administered 2014-01-03: 4 mL
  Administered 2014-01-03: 5 mL
  Administered 2014-01-03: 6 mL
  Administered 2014-01-03: 3 mL

## 2014-01-03 MED ORDER — IBUPROFEN 600 MG PO TABS
600.0000 mg | ORAL_TABLET | Freq: Four times a day (QID) | ORAL | Status: DC
  Administered 2014-01-03 – 2014-01-04 (×4): 600 mg via ORAL
  Filled 2014-01-03 (×4): qty 1

## 2014-01-03 MED ORDER — NALBUPHINE HCL 10 MG/ML IJ SOLN
10.0000 mg | INTRAMUSCULAR | Status: DC | PRN
  Administered 2014-01-03: 10 mg via INTRAVENOUS
  Filled 2014-01-03: qty 1

## 2014-01-03 MED ORDER — DIPHENHYDRAMINE HCL 25 MG PO CAPS: 25.0000 mg | ORAL_CAPSULE | Freq: Four times a day (QID) | ORAL | Status: DC | PRN

## 2014-01-03 MED ORDER — BENZOCAINE-MENTHOL 20-0.5 % EX AERO
1.0000 "application " | INHALATION_SPRAY | CUTANEOUS | Status: DC | PRN
  Administered 2014-01-03: 1 via TOPICAL
  Filled 2014-01-03: qty 56

## 2014-01-03 MED ORDER — LANOLIN HYDROUS EX OINT: TOPICAL_OINTMENT | CUTANEOUS | Status: DC | PRN

## 2014-01-03 MED ORDER — SODIUM BICARBONATE 8.4 % IV SOLN
INTRAVENOUS | Status: DC | PRN
  Administered 2014-01-03: 5 mL via EPIDURAL

## 2014-01-03 MED ORDER — DIBUCAINE 1 % RE OINT
1.0000 "application " | TOPICAL_OINTMENT | RECTAL | Status: DC | PRN
  Administered 2014-01-03: 1 via RECTAL
  Filled 2014-01-03: qty 28

## 2014-01-03 MED ORDER — ZOLPIDEM TARTRATE 5 MG PO TABS: 5.0000 mg | ORAL_TABLET | Freq: Every evening | ORAL | Status: DC | PRN

## 2014-01-03 MED ORDER — SENNOSIDES-DOCUSATE SODIUM 8.6-50 MG PO TABS
2.0000 | ORAL_TABLET | ORAL | Status: DC
  Administered 2014-01-03: 2 via ORAL
  Filled 2014-01-03: qty 2

## 2014-01-03 MED ORDER — OXYTOCIN 40 UNITS IN LACTATED RINGERS INFUSION - SIMPLE MED: 62.5000 mL/h | INTRAVENOUS | Status: DC

## 2014-01-03 MED ORDER — TETANUS-DIPHTH-ACELL PERTUSSIS 5-2.5-18.5 LF-MCG/0.5 IM SUSP: 0.5000 mL | Freq: Once | INTRAMUSCULAR | Status: DC

## 2014-01-03 MED ORDER — FENTANYL CITRATE 0.05 MG/ML IJ SOLN
INTRAMUSCULAR | Status: DC | PRN
  Administered 2014-01-03: 100 ug via EPIDURAL

## 2014-01-03 MED ORDER — OXYCODONE-ACETAMINOPHEN 5-325 MG PO TABS: 2.0000 | ORAL_TABLET | ORAL | Status: DC | PRN

## 2014-01-03 MED ORDER — OXYCODONE-ACETAMINOPHEN 5-325 MG PO TABS
1.0000 | ORAL_TABLET | ORAL | Status: DC | PRN
  Administered 2014-01-04: 1 via ORAL
  Filled 2014-01-03: qty 1

## 2014-01-03 MED ORDER — BUPIVACAINE HCL (PF) 0.25 % IJ SOLN
INTRAMUSCULAR | Status: DC | PRN
  Administered 2014-01-03: 10 mL via EPIDURAL

## 2014-01-03 MED ORDER — FENTANYL 2.5 MCG/ML BUPIVACAINE 1/10 % EPIDURAL INFUSION (WH - ANES): 14.0000 mL/h | INTRAMUSCULAR | Status: DC | PRN

## 2014-01-03 MED ORDER — ONDANSETRON HCL 4 MG PO TABS: 4.0000 mg | ORAL_TABLET | ORAL | Status: DC | PRN

## 2014-01-03 NOTE — Lactation Note (Signed)
This note was copied from the chart of Charlene [name]. Lactation Consultation Note  Patient Name: Charlene Charlene Fickmily Charlene [name] FAOZH'YToday's Date: 01/03/2014 Reason for consult: Initial assessment;Breast/nipple pain;Difficult latch Mom has 33 yo twins born premature and she pumped exclusively for them with only a few latch attempts during the months of pumping.  Her milk supply was not sufficient for twins despite regular pumping.  Mom has soft/supple/everted nipples which are large and baby has tight latch which is causing mom soreness.  LC observed baby in sidelying position on mom's left breast with widely flanged lips but any adjustment of mom's soft breast tissue caused increased nipple pain.  Mom says she has difficulty hand expressing due to sensitive nipples and breasts.  LC demonstrated suck training and assisted mom to attempt to latch in football position on (R) but baby too sleepy after a 10 minute feeding on (L).  LC encouraged STS and suck training prior to latch.  Mom prefers sitting up for feedings since she will need to care for twin toddlers at home.  LC encouraged cue feedings and mom may need to use DEBP if latching is too painful and she is unable to hand express comfortably. LC encouraged frequent STS, careful positioning, suck training prior to latch.  Mom encouraged to feed baby 8-12 times/24 hours and with feeding cues. LC encouraged review of Baby and Me pp 9, 14 and 20-25 for STS and BF information. LC provided [employer] Resource brochure and reviewed [facility] services and list of community and web site resources. Assessment and possible options discussed with RN, Fannie KneeSue.    Maternal Data Formula Feeding for Exclusion: Yes Reason for exclusion: Mother's choice to formula and breast feed on admission Has patient been taught Hand Expression?: Yes (mom states it is too painful for her to hand express) Does the patient have breastfeeding experience prior to this delivery?: Yes  Feeding Feeding Type: Breast  Fed Length of feed: 10 min  LATCH Score/Interventions Latch: Too sleepy or reluctant, no latch achieved, no sucking elicited. Intervention(s): Skin to skin;Teach feeding cues;Waking techniques (suck training) Intervention(s): Adjust position;Assist with latch (mom cannot bear any breast compression, even very slight)  Audible Swallowing: None Intervention(s): Skin to skin Intervention(s): Skin to skin  Type of Nipple: Everted at rest and after stimulation  Comfort (Breast/Nipple): Filling, red/small blisters or bruises, mild/mod discomfort (no visible trauma but mom has very sensitive breasts/nipples)  Problem noted: Mild/Moderate discomfort  Hold (Positioning): Assistance needed to correctly position infant at breast and maintain latch. Intervention(s): Support Pillows;Position options;Skin to skin  LATCH Score: 4 (no latch on left breast at this attempt)  Lactation Tools Discussed/Used   STS, cue feedings, suck training, positioning for comfort of mom and baby  Consult Status Consult Status: Follow-up Date: 01/04/14 Follow-up type: In-patient    Warrick ParisianBryant, [facility] 01/03/2014, 10:21 PM

## 2014-01-03 NOTE — Anesthesia Procedure Notes (Addendum)
Epidural Patient location during procedure: OB  Preanesthetic Checklist Completed: patient identified, site marked, surgical consent, pre-op evaluation, timeout performed, IV checked, risks and benefits discussed and monitors and equipment checked  Epidural Patient position: sitting Prep: site prepped and draped and DuraPrep Patient monitoring: continuous pulse ox and blood pressure Approach: midline Location: L3-L4 Injection technique: LOR air  Needle:  Needle type: Tuohy  Needle gauge: 17 G Needle length: 9 cm and 9 Needle insertion depth: 7 cm Catheter type: closed end flexible Catheter size: 19 Gauge Catheter at skin depth: 14 cm Test dose: negative  Assessment Events: blood not aspirated, injection not painful, no injection resistance, negative IV test and no paresthesia  Additional Notes Dosing of Epidural:  1st dose, through catheter ............................................Marland Kitchen.  Xylocaine 40 mg  2nd dose, through catheter, after waiting 3 minutes........Marland Kitchen.Xylocaine 60 mg    ( 1% Xylo charted as a single dose in Epic Meds for ease of charting; actual dosing was fractionated as above, for saftey's sake)  As each dose occurred, patient was free of IV sx; and patient exhibited no evidence of SA injection.  Patient is more comfortable after epidural dosed. Please see RN's note for documentation of vital signs,and FHR which are stable.  Patient reminded not to try to ambulate with numb legs, and that an RN must be present when she attempts to get up.      Epidural Patient location during procedure: OB  Preanesthetic Checklist Completed: patient identified, site marked, surgical consent, pre-op evaluation, timeout performed, IV checked, risks and benefits discussed and monitors and equipment checked  Epidural Patient position: sitting Prep: site prepped and draped and DuraPrep Patient monitoring: continuous pulse ox and blood pressure Approach:  midline Location: L2-L3 Injection technique: LOR air  Needle:  Needle type: Tuohy  Needle gauge: 17 G Needle length: 9 cm and 9 Needle insertion depth: 7 cm Catheter type: closed end flexible Catheter size: 19 Gauge Catheter at skin depth: 14 cm Test dose: negative  Assessment Events: blood not aspirated, injection not painful, no injection resistance, negative IV test and no paresthesia  Additional Notes Dosing of Epidural:  1st dose, through catheter ............................................Marland Kitchen. Xylocaine 30 mg  2nd dose, through catheter, after waiting 3 minutes.......Marland Kitchen.Xylocaine 50 mg   ( 1% Xylo charted as a single dose in Epic Meds for ease of charting; actual dosing was fractionated as above, for saftey's sake)  As each dose occurred, patient was free of IV sx; and patient exhibited no evidence of SA injection.  Patient is more comfortable after epidural dosed. Please see RN's note for documentation of vital signs,and FHR which are stable.  Patient reminded not to try to ambulate with numb legs, and that an RN must be present the 1st time she attempts to get up.

## 2014-01-03 NOTE — Progress Notes (Signed)
Patient's spouse calls out to nurse's station requesting nurse to come to room; upon entering room patient was noticeably upset and moaning in pain from contractions; she stated she woke up and the pain was "completely back" and the epidural stopped working; gave patient 3 PCA doses per protocol with no relief per patient; call made to Dr Jean Rosenthal about patient's discomfort he ordered to give another PCA dose and test T level; after 2 more PCA doses with no relief and minimal numbness noted in abdomen or pelvic area call returned to Dr Jean Rosenthal about results. Dr Jean Rosenthal to assess patient and review options for pain relief

## 2014-01-03 NOTE — Anesthesia Preprocedure Evaluation (Signed)
Anesthesia Evaluation  Patient identified by MRN, date of birth, ID band Patient awake    Reviewed: Allergy & Precautions, H&P , Patient's Chart, lab work & pertinent test results  Airway Mallampati: II  TM Distance: >3 FB Neck ROM: full    Dental  (+) Teeth Intact   Pulmonary former smoker,  breath sounds clear to auscultation        Cardiovascular hypertension, Rhythm:regular Rate:Normal     Neuro/Psych No Current sx of numbness or weakness with MS. Discussed unknown effect of Regional with MS; Pt had CLE before.   Old Chart reviewed    GI/Hepatic GERD-  Medicated,  Endo/Other  Morbid obesity  Renal/GU      Musculoskeletal   Abdominal   Peds  Hematology   Anesthesia Other Findings       Reproductive/Obstetrics (+) Pregnancy                             Anesthesia Physical Anesthesia Plan  ASA: III  Anesthesia Plan: Epidural   Post-op Pain Management:    Induction:   Airway Management Planned:   Additional Equipment:   Intra-op Plan:   Post-operative Plan:   Informed Consent: I have reviewed the patients History and Physical, chart, labs and discussed the procedure including the risks, benefits and alternatives for the proposed anesthesia with the patient or authorized representative who has indicated his/her understanding and acceptance.   Dental Advisory Given  Plan Discussed with:   Anesthesia Plan Comments: (Labs checked- platelets confirmed with RN in room. Fetal heart tracing, per RN, reported to be stable enough for sitting procedure. Discussed epidural, and patient consents to the procedure:  included risk of possible headache,backache, failed block, allergic reaction, and nerve injury. This patient was asked if she had any questions or concerns before the procedure started.)        Anesthesia Quick Evaluation

## 2014-01-03 NOTE — Anesthesia Postprocedure Evaluation (Signed)
  Anesthesia Post-op Note  Patient: Charlene [name]  Procedure(s) Performed: * No procedures listed *  Patient Location: Mother/Baby  Anesthesia Type:Epidural  Level of Consciousness: awake  Airway and Oxygen Therapy: Patient Spontanous Breathing  Post-op Pain: mild  Post-op Assessment: Patient's Cardiovascular Status Stable and Respiratory Function Stable  Post-op Vital Signs: stable  Last Vitals:  Filed Vitals:   01/03/14 1455  BP: 157/85  Pulse: 96  Temp: 37.5 C  Resp: 18    Complications: No apparent anesthesia complications

## 2014-01-03 NOTE — Progress Notes (Signed)
Charlene [name] is a 33 y.o. [id number] at [redacted]w[redacted]d admitted for IOL for preE  Subjective: Comfortable since IV Fentanyl and adjustment in epidural meds about 2 hrs ago. Now having "tailbone" pain and shakey. No H/A, visual disturbance.    Objective: Appears much less anxious BP 142/88 mmHg  Pulse 90  Temp(Src) 98.6 F (37 C) (Oral)  Resp 20  Ht 5\' 6"  (1.676 m)  Wt 127.461 kg (281 lb)  BMI 45.38 kg/m2  SpO2 99% Filed Vitals:   01/03/14 1037 01/03/14 1041 01/03/14 1046 01/03/14 1101  BP:  159/94 150/86 142/88  Pulse: 94 97 91 90  Temp:      TempSrc:      Resp:  20 20 20   Height:      Weight:      SpO2:       Fetal Heart: Baseline 120-125, moderate variability, accelerations present, isolated variable 100-110 when supine  Contractions: q 2-3 min with pitocin at 6 mu  SVE:   Dilation: 10 Effacement (%): 80 Station: +1, +2 Exam by:: D. Khristina Janota, CNM Small ant lip reduced manually as pt pushed to +2, LOA, little caput  Assessment / Plan:  Labor: Start active 2nd stage Fetal Wellbeing: Category1-2 Pain Control:  Adequate! Expected mode of delivery: NSVD  Cristan Scherzer 01/03/2014, 12:23 PM

## 2014-01-03 NOTE — Progress Notes (Signed)
Charlene [name] is a 34 y.o. [id number] at [redacted]w[redacted]d admitted for induction of labor due to [facility] SI pre-e.  Subjective: Doing well, comfortable now w/ epidural- had to be redone  Objective: BP 133/75 mmHg  Pulse 82  Temp(Src) 98 F (36.7 C) (Oral)  Resp 18  Ht 5\' 6"  (1.676 m)  Wt 127.461 kg (281 lb)  BMI 45.38 kg/m2  SpO2 99%    FHT:  FHR: 130 bpm, variability: moderate,  accelerations:  Present,  decelerations:  Absent UC:   regular, every 2 minutes  SVE:   Dilation: 6.5 Effacement (%): 80 Station: -2 Exam by:: Genella Rife, CNM  AROM'd clear fluid  Pitocin @ 6 mu/min  Labs: Lab Results  Component Value Date   WBC 9.6 01/02/2014   HGB 12.0 01/02/2014   HCT 35.3* 01/02/2014   MCV 91.5 01/02/2014   PLT 161 01/02/2014    Assessment / Plan: Induction of labor due to [facility] w/ SI mild pre-e,  progressing well on pitocin  Labor: Progressing normally Fetal Wellbeing:  Category I Pain Control:  Epidural Pre-eclampsia: mild, bp's stable I/D:  pcn for gbs+ Anticipated MOD:  NSVD  [name] CNM, WHNP-BC 01/03/2014, 8:08 AM

## 2014-01-03 NOTE — Progress Notes (Signed)
Charlene [name] is a 33 y.o. [id number] at [redacted]w[redacted]d admitted for IOL for mild preeclampsia.   Subjective: 2-3 hrs after 2nd epidural placed, began to have contraction pain felt in lower abd R>L and low back. Requesting C/S and declines another epidural placement. Does not want anti-anxiety med.  I spoke with Drs. Cristela Blue and Brayton Caves, who just saw pt. Ice test passed.  PMH significant for MS.   Objective: BP 145/90 mmHg  Pulse 92  Temp(Src) 98 F (36.7 C) (Oral)  Resp 18  Ht 5\' 6"  (1.676 m)  Wt 127.461 kg (281 lb)  BMI 45.38 kg/m2  SpO2 99% Filed Vitals:   01/03/14 0731 01/03/14 0801 01/03/14 0833 01/03/14 0901  BP: 133/75 147/84 154/85 145/90  Pulse: 82 88 93 92  Temp: 98 F (36.7 C)     TempSrc: Oral     Resp: 18 18 18 18   Height:      Weight:      SpO2:        Exam Gen: Obese female breathing through UCs and tearful at times  Fetal Heart FHR: 130-135 bpm, variability: moderate,  accelerations:  Present,  decelerations:  Absent   Contractions: q 3-4 Had AROM at 0800 (clear) and cx was 6-7; 6cm at 0230  SVE:   Dilation: 7 Effacement (%): 80 Station: -2 Exam by:: Bernita Buffy, CNM  Assessment / Plan:  Labor:  Active phase with slow progress.    Preeclamosia stable.   Anxiety Fetal Wellbeing: Categroy 1 Pain Control:  Inadequate> Dr. Jean Rosenthal to adjust epidural infusion Expected mode of delivery: NSVD hopeful IUPC if no change next check Will d/w/ Dr. Broadus John 01/03/2014, 9:29 AM

## 2014-01-03 NOTE — Progress Notes (Signed)
Charlene [name] is a 33 y.o. [id number] at [redacted]w[redacted]d by ultrasound admitted for induction of labor due to pre-eclampsia without severe features.   Subjective: Pt was initially comfortable, but prior to my arrival, she became irrational with pain and was demanding Cesarean delivery. Anesthesia dosed her epidural and with breathing techniques we were able to calm her down.   Objective: BP 143/76 mmHg  Pulse 93  Temp(Src) 98 F (36.7 C) (Oral)  Resp 22  Ht 5\' 6"  (1.676 m)  Wt 281 lb (127.461 kg)  BMI 45.38 kg/m2  SpO2 99%      FHT:  FHR: 130 bpm, variability: moderate,  accelerations:  Present,  decelerations:  Absent UC:   regular, every 2-3 minutes SVE:   Dilation: 8.5 Effacement (%): 80 Station: -2 Exam by:: Dr. Shawnie Pons  Labs: Lab Results  Component Value Date   WBC 9.6 01/02/2014   HGB 12.0 01/02/2014   HCT 35.3* 01/02/2014   MCV 91.5 01/02/2014   PLT 161 01/02/2014    Assessment / Plan: Induction of labor due to preeclampsia,  progressing well on pitocin  Labor: initially slow progress, seems to be moving along since AROM Preeclampsia:  labs stable Fetal Wellbeing:  Category II Pain Control:  Epidural I/D:  n/a Anticipated MOD:  NSVD, but C-section is an option, if she does not continue to change. Will change position and add peanut pillow.  Charlene [name] S 01/03/2014, 10:27 AM

## 2014-01-04 LAB — CBC
HCT: 30.8 % — ABNORMAL LOW (ref 36.0–46.0)
Hemoglobin: 10.6 g/dL — ABNORMAL LOW (ref 12.0–15.0)
MCH: 31.6 pg (ref 26.0–34.0)
MCHC: 34.4 g/dL (ref 30.0–36.0)
MCV: 91.9 fL (ref 78.0–100.0)
Platelets: 135 10*3/uL — ABNORMAL LOW (ref 150–400)
RBC: 3.35 MIL/uL — AB (ref 3.87–5.11)
RDW: 13.4 % (ref 11.5–15.5)
WBC: 10.4 10*3/uL (ref 4.0–10.5)

## 2014-01-04 MED ORDER — DOCUSATE SODIUM 100 MG PO CAPS: 100.0000 mg | ORAL_CAPSULE | Freq: Two times a day (BID) | ORAL | Status: DC | PRN

## 2014-01-04 MED ORDER — HYDROCORTISONE ACE-PRAMOXINE 1-1 % RE FOAM
Freq: Three times a day (TID) | RECTAL | Status: DC | PRN
  Administered 2014-01-04: 14:00:00 via RECTAL
  Filled 2014-01-04: qty 10

## 2014-01-04 MED ORDER — IBUPROFEN 600 MG PO TABS: 600.0000 mg | ORAL_TABLET | Freq: Four times a day (QID) | ORAL | Status: DC

## 2014-01-04 NOTE — Lactation Note (Signed)
This note was copied from the chart of Charlene Jimi Teets. Lactation Consultation Note Follow up visit at 29 hours of age.  Mom reports they are being discharged home now.  Mom reports wanting to only bottle feed and some pumping at home.  Discussed supply and demand, pumping, engorgement and weaning with mom.  Mom denies questions and knows to contact [facility] services as needed.    Patient Name: Charlene [name] Date: 01/04/2014     Maternal Data    Feeding Feeding Type: Bottle Fed - Formula  LATCH Score/Interventions                      Lactation Tools Discussed/Used     Consult Status Consult Status: Complete    Shoptaw, Arvella Merles 01/04/2014, 5:58 PM

## 2014-01-04 NOTE — Discharge Summary (Signed)
Obstetric Discharge Summary Reason for Admission: induction of labor and preeclampsia Prenatal Procedures: ultrasound Intrapartum Procedures: spontaneous vaginal delivery Postpartum Procedures: none Complications-Operative and Postpartum: none HEMOGLOBIN  Date Value Ref Range Status  01/04/2014 10.6* 12.0 - 15.0 g/dL Final   HCT  Date Value Ref Range Status  01/04/2014 30.8* 36.0 - 46.0 % Final    Physical Exam:  General: alert, cooperative and no distress Lochia: appropriate Uterine Fundus: firm Incision: n/a DVT Evaluation: No evidence of DVT seen on physical exam. Negative Homan's sign. No cords or calf tenderness. No significant calf/ankle edema.  Discharge Diagnoses: Term Pregnancy-delivered  Discharge Information: Date: 01/04/2014 Activity: pelvic rest Diet: routine Medications: Ibuprofen and Colace Condition: stable Instructions: refer to practice specific booklet Discharge to: home  Contraception: Vasectomy  Follow-up Information    Follow up with [facility]   Specialty:  Obstetrics and Gynecology   Why:  In 4-6 weeks for postpartum visit   Contact information:   [address], [address] [place] [address] [phone]      Newborn Data: Live born female  Birth Weight: 8 lb 10.8 oz (3935 g) APGAR: 8, 9  Home with mother pending d/c after 24 hours by pediatrician.  LEFTWICH-KIRBY, Ana Woodroof 01/04/2014, 7:20 AM

## 2014-01-04 NOTE — Progress Notes (Signed)
Patient complaint of worsening pain at rectal site after use of Tucks, protofoam, sitz. Call to MD to assess perineum prior to discharge this evening. Boykin Peek, RN

## 2014-01-04 NOTE — Progress Notes (Signed)
Clinical Social Work Department PSYCHOSOCIAL ASSESSMENT - MATERNAL/CHILD 01/04/2014  Patient:  Charlene [name]  Account Number:  [id number]  [place] Date:  01/01/2014  Ardine Eng Name:   Charlene [name]   Clinical Social Worker:  Charlene [name], CLINICAL SOCIAL WORKER   Date/Time:  01/04/2014 10:15 AM  Date Referred:  01/03/2014   Referral source  Central Nursery     Referred reason  Depression/Anxiety   Other referral source:    I:  FAMILY / [place] legal guardian:  PARENT  Guardian - Name Guardian - Age [name], [place] [address]  Mr. Charlene [name]  same as above   Other household support members/support persons Name Relationship DOB   72 27 years old   DAUGHTER 33 years old   Other support:   [name] reported having few family members that live nearby, but stated that they have family arranged to visit and stay next week.  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    II  PSYCHOSOCIAL DATA Information Source:  Family Interview  Financial and [employer] Employment:   [name] stated that she stays at home.  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   Financial resources:  Multimedia programmer If [employer]:    School / Grade:  N/A Music therapist / Child Services Coordination / Early Interventions:   None reported  Cultural issues impacting care:   None reported   III  STRENGTHS Strengths  Adequate Resources  Home prepared for Child (including basic supplies)  Supportive family/friends   Strength comment:    IV  RISK FACTORS AND CURRENT PROBLEMS Current Problem:  YES   Risk Factor & Current Problem Patient Issue Family Issue Risk Factor / Current Problem Comment  Mental Illness Y N [name] presents with history of anxiety (more than 3 years) and postpartum depression.    V  SOCIAL WORK  ASSESSMENT CSW met with the [name] due to maternal history of anxiety.  Upon chart review, CSW also noted history of postpartum depression.  [name] presented as easily engaged and receptive to the visit.  She displayed a full range in affect, presented in a pleasant mood, and was observed to be attending to and engaging with the baby during the visit.  [name] did not present with any acute symptoms of depression and anxiety, and presents with insight on indicators that symptoms are worsening.  [name] and FOB denied questions or concerns at end of the visit, acknowledged ongoing CSW availability, and expressed gratitude for the CSW visit.   [name] acknowledged increase in anxiety as she prepares to transition from two children to three.  She shared that she is still "not quite sure" how she will make the transition, but discussed that happiness associated with her baby's arrival.  She stated that they do not have a lot of family in the area, but shared that they have family visiting from [place] next week and that the FOB is taking time off from work to assist with the transition.  [name] also reported that she has a strong support system via the stay at home mother group she created after her twins were born.  She stated that it has been helpful for her to be able to relate to other mothers who are also experiencing similar thoughts and feelings.   [name] acknowledged history of anxiety.  She  shared belief that it was well controlled, denied acute symptoms.  [name] also acknowledged history of postpartum depression.  She discussed onset around 9 months postpartum, and shared that she felt overwhelmed, cried frequently, and had low motivation.  [name] originally did not classify her symptoms as postpartum depression, appearing to minimize her experience, but did acknowledge that it was likely a true diagnosis when CSW continued to review her previous experience in the postpartum period.  [name] did not present with concrete plans to prepare  for the transition to the postpartum period given her increased risk for postpartum depression, but did express desire to continue to engage with her stay at home mother group, to talk with them in order to normalize her feelings, and to discuss her feelings with her MD if she notes worsening/intesnsifying symptoms.  Per [name], she does not have time to be stressed/overwhelmed since she has her "hands full" with her children.   No barriers to discharge.   VI SOCIAL WORK PLAN Social Work Therapist, art  No Further Intervention Required / No Barriers to Discharge   Type of pt/family education:   Postpartum depression   If child protective services report - county:   If child protective services report - date:   Information/referral to community resources comment:   [name] reported positive/supportive relationship with her MD.  She shared intention to reach out to MD if she notes symptoms of postpartum depression and normative emotional regulation skills are ineffective in controlling symptoms.   Other social work plan:   CSW to follow up PRN.

## 2014-01-04 NOTE — Discharge Instructions (Signed)
Postpartum Care After Vaginal Delivery °After you deliver your newborn (postpartum period), the usual stay in the hospital is 24-72 hours. If there were problems with your labor or delivery, or if you have other medical problems, you might be in the hospital longer.  °While you are in the hospital, you will receive help and instructions on how to care for yourself and your newborn during the postpartum period.  °While you are in the hospital: °· Be sure to tell your nurses if you have pain or discomfort, as well as where you feel the pain and what makes the pain worse. °· If you had an incision made near your vagina (episiotomy) or if you had some tearing during delivery, the nurses may put ice packs on your episiotomy or tear. The ice packs may help to reduce the pain and swelling. °· If you are breastfeeding, you may feel uncomfortable contractions of your uterus for a couple of weeks. This is normal. The contractions help your uterus get back to normal size. °· It is normal to have some bleeding after delivery. °· For the first 1-3 days after delivery, the flow is red and the amount may be similar to a period. °· It is common for the flow to start and stop. °· In the first few days, you may pass some small clots. Let your nurses know if you begin to pass large clots or your flow increases. °· Do not  flush blood clots down the toilet before having the nurse look at them. °· During the next 3-10 days after delivery, your flow should become more watery and pink or brown-tinged in color. °· Ten to fourteen days after delivery, your flow should be a small amount of yellowish-white discharge. °· The amount of your flow will decrease over the first few weeks after delivery. Your flow may stop in 6-8 weeks. Most women have had their flow stop by 12 weeks after delivery. °· You should change your sanitary pads frequently. °· Wash your hands thoroughly with soap and water for at least 20 seconds after changing pads, using  the toilet, or before holding or feeding your newborn. °· You should feel like you need to empty your bladder within the first 6-8 hours after delivery. °· In case you become weak, lightheaded, or faint, call your nurse before you get out of bed for the first time and before you take a shower for the first time. °· Within the first few days after delivery, your breasts may begin to feel tender and full. This is called engorgement. Breast tenderness usually goes away within 48-72 hours after engorgement occurs. You may also notice milk leaking from your breasts. If you are not breastfeeding, do not stimulate your breasts. Breast stimulation can make your breasts produce more milk. °· Spending as much time as possible with your newborn is very important. During this time, you and your newborn can feel close and get to know each other. Having your newborn stay in your room (rooming in) will help to strengthen the bond with your newborn.  It will give you time to get to know your newborn and become comfortable caring for your newborn. °· Your hormones change after delivery. Sometimes the hormone changes can temporarily cause you to feel sad or tearful. These feelings should not last more than a few days. If these feelings last longer than that, you should talk to your caregiver. °· If desired, talk to your caregiver about methods of family planning or contraception. °·   Talk to your caregiver about immunizations. Your caregiver may want you to have the following immunizations before leaving the hospital: °· Tetanus, diphtheria, and pertussis (Tdap) or tetanus and diphtheria (Td) immunization. It is very important that you and your family (including grandparents) or others caring for your newborn are up-to-date with the Tdap or Td immunizations. The Tdap or Td immunization can help protect your newborn from getting ill. °· Rubella immunization. °· Varicella (chickenpox) immunization. °· Influenza immunization. You should  receive this annual immunization if you did not receive the immunization during your pregnancy. °Document Released: 10/29/2006 Document Revised: 09/26/2011 Document Reviewed: 08/29/2011 °ExitCare® Patient Information ©2015 [employer]. This information is not intended to replace advice given to you by your health care provider. Make sure you discuss any questions you have with your health care provider. ° °Hemorrhoids °Hemorrhoids are swollen veins around the rectum or anus. There are two types of hemorrhoids:  °· Internal hemorrhoids. These occur in the veins just inside the rectum. They may poke through to the outside and become irritated and painful. °· External hemorrhoids. These occur in the veins outside the anus and can be felt as a painful swelling or hard lump near the anus. °CAUSES °· Pregnancy.   °· Obesity.   °· Constipation or diarrhea.   °· Straining to have a bowel movement.   °· Sitting for long periods on the toilet. °· Heavy lifting or other activity that caused you to strain. °· Anal intercourse. °SYMPTOMS  °· Pain.   °· Anal itching or irritation.   °· Rectal bleeding.   °· Fecal leakage.   °· Anal swelling.   °· One or more lumps around the anus.   °DIAGNOSIS  °Your caregiver may be able to diagnose hemorrhoids by visual examination. Other examinations or tests that may be performed include:  °· Examination of the rectal area with a gloved hand (digital rectal exam).   °· Examination of anal canal using a small tube (scope).   °· A blood test if you have lost a significant amount of blood. °· A test to look inside the colon (sigmoidoscopy or colonoscopy). °TREATMENT °Most hemorrhoids can be treated at home. However, if symptoms do not seem to be getting better or if you have a lot of rectal bleeding, your caregiver may perform a procedure to help make the hemorrhoids get smaller or remove them completely. Possible treatments include:  °· Placing a rubber band at the base of the hemorrhoid to  cut off the circulation (rubber band ligation).   °· Injecting a chemical to shrink the hemorrhoid (sclerotherapy).   °· Using a tool to burn the hemorrhoid (infrared light therapy).   °· Surgically removing the hemorrhoid (hemorrhoidectomy).   °· Stapling the hemorrhoid to block blood flow to the tissue (hemorrhoid stapling).   °HOME CARE INSTRUCTIONS  °· Eat foods with fiber, such as whole grains, beans, nuts, fruits, and vegetables. Ask your doctor about taking products with added fiber in them (fiber supplements). °· Increase fluid intake. Drink enough water and fluids to keep your urine clear or pale yellow.   °· Exercise regularly.   °· Go to the bathroom when you have the urge to have a bowel movement. Do not wait.   °· Avoid straining to have bowel movements.   °· Keep the anal area dry and clean. Use wet toilet paper or moist towelettes after a bowel movement.   °· Medicated creams and suppositories may be used or applied as directed.   °· Only take over-the-counter or prescription medicines as directed by your caregiver.   °· Take warm sitz baths for 15-20 minutes, 3-4   times a day to ease pain and discomfort.   °· Place ice packs on the hemorrhoids if they are tender and swollen. Using ice packs between sitz baths may be helpful.   °¨ Put ice in a plastic bag.   °¨ Place a towel between your skin and the bag.   °¨ Leave the ice on for 15-20 minutes, 3-4 times a day.   °· Do not use a donut-shaped pillow or sit on the toilet for long periods. This increases blood pooling and pain.   °SEEK MEDICAL CARE IF: °· You have increasing pain and swelling that is not controlled by treatment or medicine. °· You have uncontrolled bleeding. °· You have difficulty or you are unable to have a bowel movement. °· You have pain or inflammation outside the area of the hemorrhoids. °MAKE SURE YOU: °· Understand these instructions. °· Will watch your condition. °· Will get help right away if you are not doing well or get  worse. °Document Released: 12/30/1999 Document Revised: 12/19/2011 Document Reviewed: 11/06/2011 °ExitCare® Patient Information ©2015 [employer]. This information is not intended to replace advice given to you by your health care provider. Make sure you discuss any questions you have with your health care provider. ° °

## 2014-01-05 ENCOUNTER — Other Ambulatory Visit (HOSPITAL_COMMUNITY): Payer: BC Managed Care – PPO

## 2014-01-05 ENCOUNTER — Ambulatory Visit (HOSPITAL_COMMUNITY): Payer: BC Managed Care – PPO

## 2014-01-05 LAB — TYPE AND SCREEN
ABO/RH(D): A POS
Antibody Screen: NEGATIVE
UNIT DIVISION: 0
UNIT DIVISION: 0
Unit division: 0
Unit division: 0

## 2014-01-09 ENCOUNTER — Ambulatory Visit (INDEPENDENT_AMBULATORY_CARE_PROVIDER_SITE_OTHER): Payer: BC Managed Care – PPO | Admitting: Family Medicine

## 2014-01-09 ENCOUNTER — Ambulatory Visit (INDEPENDENT_AMBULATORY_CARE_PROVIDER_SITE_OTHER): Payer: BC Managed Care – PPO

## 2014-01-09 VITALS — BP 132/82 | HR 84 | Temp 98.4°F | Resp 17 | Ht 66.0 in | Wt 252.0 lb

## 2014-01-09 DIAGNOSIS — R05 Cough: Secondary | ICD-10-CM

## 2014-01-09 DIAGNOSIS — B9789 Other viral agents as the cause of diseases classified elsewhere: Principal | ICD-10-CM

## 2014-01-09 DIAGNOSIS — J069 Acute upper respiratory infection, unspecified: Secondary | ICD-10-CM

## 2014-01-09 DIAGNOSIS — R059 Cough, unspecified: Secondary | ICD-10-CM

## 2014-01-09 MED ORDER — HYDROCODONE-HOMATROPINE 5-1.5 MG/5ML PO SYRP: 5.0000 mL | ORAL_SOLUTION | Freq: Three times a day (TID) | ORAL | Status: DC | PRN

## 2014-01-09 MED ORDER — GUAIFENESIN ER 1200 MG PO TB12
1.0000 | ORAL_TABLET | Freq: Two times a day (BID) | ORAL | Status: DC | PRN
Start: 2014-01-09 — End: 2014-02-23

## 2014-01-09 NOTE — Patient Instructions (Signed)
Continue Ibuprofen and Pseudoephedrine prn.

## 2014-01-09 NOTE — Progress Notes (Signed)
History and physical examinations reviewed with Deliah BostonMichael Clark, PA-C.  CXR reviewed. Agree with A/P.

## 2014-01-09 NOTE — Progress Notes (Signed)
IDENTIFYING INFORMATION  Charlene [name] / DOB: March 20, 1980 / MRN: [id number]  The patient has Adjustment disorder with anxiety; History of postpartum depression; History of preterm delivery, currently pregnant in first trimester; MS (multiple sclerosis); Chronic hypertension complicating or reason for care during pregnancy; Proteinuria affecting pregnancy in first trimester; Pregnancy resulting from assisted reproductive technology; Multiple sclerosis; Obesity in pregnancy, antepartum; Obesity; Fetal macrosomia during pregnancy in third trimester; Renal disease during pregnancy; and Preeclampsia on her problem list.  SUBJECTIVE  CC: Shortness of Breath; URI; Nasal Congestion; and Laryngitis   HPI: Charlene [name] is a 5 day post partum 33 y.o. y.o. female presenting for a dry cough that has been present for roughly 30 days, however appears to be improving today.  She was seen for this at an urgent care near the onset of the illness and was treated symptomatically with a codeine tylenol combination,which made her nauseated so she stopped this after one dose. She was also prescribed a Z-pak in the first week of December from her OB for bronchitis, which did not help.  She reports her cough has worsened as of Monday, and she also complains of congestion and rhinorrhea. She states that she is often short of breath with coughing, and that this is scary for her. She also complains of congestion and runny nose.  She denies a history of asthma and allergies.  She is concerned that this may be pneumonia. She denies fever and diaphoresis.  She denies leg pain and new SOB with exertion.  She was taking ASA 81 mg previous to her most recent L&D admission for her "preeclampsia." She feels that the cough is improving as of today.   She has been taking pseudoephedrine 12 hour and ibuprofen 600.  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    She has a current medication list which includes the following prescription(s): glatiramer acetate, ibuprofen, and ranitidine.  Charlene [name] has No Known Allergies. She  reports that she quit smoking about 3 years ago. Her smoking use included Cigarettes. She has a 15 pack-year smoking history. She has never used smokeless tobacco. She reports that she does not drink alcohol or use illicit drugs. She  reports that she currently engages in sexual activity and has had female partners.  The patient  has past surgical history that includes Dilation and curettage of uterus (8/10) and Dilation and evacuation (12/29/2011).  Her family history includes Cancer in her maternal grandmother; Heart attack in her maternal grandfather.  Review of Systems  Constitutional: Positive for malaise/fatigue. Negative for fever, chills, weight loss and diaphoresis.  HENT: Positive for congestion. Negative for sore throat.   Eyes: Negative for blurred vision.  Respiratory: Positive for cough and sputum production. Negative for shortness of breath and wheezing.   Cardiovascular: Negative for chest pain.  Gastrointestinal: Negative for nausea, vomiting, abdominal pain, diarrhea and constipation.  Genitourinary: Negative.   Skin: Negative for itching and rash.  Neurological: Negative for dizziness and weakness.    OBJECTIVE  Blood pressure 132/82, pulse 84, temperature 98.4 F (36.9 C), temperature source Oral, resp. rate 17, height 5\' 6"  (1.676 m), weight 252 lb (  114.306 kg), SpO2 99 %, not currently breastfeeding. The patient's body mass index is 40.69  kg/(m^2).  Physical Exam  Vitals reviewed. Constitutional: She is oriented to person, place, and time. She appears well-developed and well-nourished.  Eyes: Pupils are equal, round, and reactive to light. No scleral icterus.  Cardiovascular: Normal rate and regular rhythm.   Respiratory: Effort normal. No respiratory distress. She has no wheezes. She has no rales. She exhibits no tenderness.  GI: Soft. Bowel sounds are normal. There is no tenderness.  Musculoskeletal:       Legs: Neurological: She is alert and oriented to person, place, and time. No cranial nerve deficit.  Skin: Skin is warm and dry. No erythema. No pallor.  Psychiatric: She has a normal mood and affect. Her behavior is normal. Judgment and thought content normal.   [facility] reading (PRIMARY) by  Dr. Katrinka BlazingSmith: Negative for acute cardiopulmonary disease.     No results found for this or any previous visit (from the past 24 hour(s)).  .  ASSESSMENT & PLAN  Charlene Burtonmily was seen today for shortness of breath, uri, nasal congestion and laryngitis.  Diagnoses and associated orders for this visit:  Cough: Patient already treated with a full course of Azithromycin. It is likely that patient has suffered two viral illnesses, unfortunately this episode complicating the last.  Vital signs and exam reassuring, and DVT/PE unlikely.  Will treat symptomatically. Patient advised to come back or go to the ED should she develop symptoms consistent with DVT/PE   - Chest two view -     Hycodan -     Mucinex -     Patient advised to continue Ibuprofen and pseudoephedrine prn.     The patient was instructed to to call or comeback to clinic as needed, or should symptoms warrant.  Deliah BostonMichael Nikhita Mentzel, MHS, PA-C [facility] 01/09/2014 9:12 AM

## 2014-01-12 ENCOUNTER — Ambulatory Visit (HOSPITAL_COMMUNITY): Payer: BC Managed Care – PPO

## 2014-01-12 ENCOUNTER — Other Ambulatory Visit (HOSPITAL_COMMUNITY): Payer: BC Managed Care – PPO

## 2014-01-13 ENCOUNTER — Other Ambulatory Visit: Payer: Self-pay | Admitting: Neurology

## 2014-01-13 DIAGNOSIS — G35 Multiple sclerosis: Secondary | ICD-10-CM

## 2014-01-14 ENCOUNTER — Inpatient Hospital Stay (HOSPITAL_COMMUNITY): Admission: RE | Admit: 2014-01-14 | Payer: BC Managed Care – PPO | Source: Ambulatory Visit

## 2014-01-24 ENCOUNTER — Other Ambulatory Visit: Payer: BC Managed Care – PPO

## 2014-02-21 ENCOUNTER — Ambulatory Visit
Admission: RE | Admit: 2014-02-21 | Discharge: 2014-02-21 | Disposition: A | Discharge status: Home / Self Care | Payer: BLUE CROSS/BLUE SHIELD | Source: Ambulatory Visit | Attending: Neurology | Admitting: Neurology

## 2014-02-21 DIAGNOSIS — G35 Multiple sclerosis: Secondary | ICD-10-CM

## 2014-02-21 MED ORDER — GADOBENATE DIMEGLUMINE 529 MG/ML IV SOLN
20.0000 mL | Freq: Once | INTRAVENOUS | Status: AC | PRN
  Administered 2014-02-21: 20 mL via INTRAVENOUS

## 2014-02-23 ENCOUNTER — Ambulatory Visit (INDEPENDENT_AMBULATORY_CARE_PROVIDER_SITE_OTHER): Payer: BLUE CROSS/BLUE SHIELD | Admitting: Obstetrics & Gynecology

## 2014-02-23 ENCOUNTER — Encounter: Payer: Self-pay | Admitting: Obstetrics & Gynecology

## 2014-02-23 DIAGNOSIS — F53 Postpartum depression: Secondary | ICD-10-CM

## 2014-02-23 DIAGNOSIS — O99345 Other mental disorders complicating the puerperium: Secondary | ICD-10-CM

## 2014-02-23 MED ORDER — FLUOXETINE HCL 20 MG PO TABS: 20.0000 mg | ORAL_TABLET | Freq: Every day | ORAL | Status: DC

## 2014-02-23 NOTE — Progress Notes (Signed)
  Subjective:     Charlene [name] is a 34 y.o. MW female who presents for a postpartum visit. She is 7 weeks postpartum following a spontaneous vaginal delivery. I have fully reviewed the prenatal and intrapartum course. The delivery was at 37 gestational weeks IOL for pre eclampsic. Outcome: spontaneous vaginal delivery. Anesthesia: epidural. Postpartum course has been uncomplicated. Baby's course has been uncomplicated. Baby is feeding by bottle - Enfamil Nutramigen. Bleeding staining only. Bowel function is normal. Bladder function is normal. Patient is not sexually active. Contraception method is condoms. Her husband plans to have a vasectomy in the near future. Postpartum depression screening: positive. She denies HI and SI. She tried zoloft with her last delivery but had diarrhea. She would prefer a different medication.  The following portions of the patient's history were reviewed and updated as appropriate: allergies, current medications, past family history, past medical history, past social history, past surgical history and problem list.  Review of Systems A comprehensive review of systems was negative.   Objective:    BP 129/84 mmHg  Pulse 83  Resp 16  Ht 5\' 6"  (1.676 m)  Wt 244 lb (110.678 kg)  BMI 39.40 kg/m2  LMP   Breastfeeding? No  General:  alert   Breasts:  inspection negative, no nipple discharge or bleeding, no masses or nodularity palpable  Lungs: clear to auscultation bilaterally  Heart:  regular rate and rhythm, S1, S2 normal, no murmur, click, rub or gallop  Abdomen: soft, non-tender; bowel sounds normal; no masses,  no organomegaly   Vulva:  not evaluated  Vagina: not evaluated  Cervix:  not evaluated  Corpus: not examined  Adnexa:  not evaluated  Rectal Exam: Not performed.        Assessment:     Normal postpartum exam.  PP depression. She declines a referral to a psychiatrist at this time. I will prescribe prozac 20 mg q am.  Pap smear not done at today's  visit.   Plan:    1. Contraception: condoms 2. Depression: Start prozac 3. Follow up in: 4 weeks or as needed.

## 2014-03-11 ENCOUNTER — Ambulatory Visit: Payer: Self-pay | Admitting: Obstetrics & Gynecology

## 2014-06-08 ENCOUNTER — Ambulatory Visit (INDEPENDENT_AMBULATORY_CARE_PROVIDER_SITE_OTHER): Payer: BLUE CROSS/BLUE SHIELD | Admitting: Family Medicine

## 2014-06-08 ENCOUNTER — Encounter: Payer: Self-pay | Admitting: Family Medicine

## 2014-06-08 VITALS — BP 120/74 | HR 101 | Temp 99.1°F | Ht 66.0 in | Wt 264.4 lb

## 2014-06-08 DIAGNOSIS — G43809 Other migraine, not intractable, without status migrainosus: Secondary | ICD-10-CM | POA: Diagnosis not present

## 2014-06-08 DIAGNOSIS — L409 Psoriasis, unspecified: Secondary | ICD-10-CM

## 2014-06-08 DIAGNOSIS — Z7689 Persons encountering health services in other specified circumstances: Secondary | ICD-10-CM

## 2014-06-08 DIAGNOSIS — R7309 Other abnormal glucose: Secondary | ICD-10-CM

## 2014-06-08 DIAGNOSIS — R739 Hyperglycemia, unspecified: Secondary | ICD-10-CM

## 2014-06-08 DIAGNOSIS — Z7189 Other specified counseling: Secondary | ICD-10-CM

## 2014-06-08 DIAGNOSIS — E785 Hyperlipidemia, unspecified: Secondary | ICD-10-CM

## 2014-06-08 DIAGNOSIS — G35 Multiple sclerosis: Secondary | ICD-10-CM

## 2014-06-08 DIAGNOSIS — G43909 Migraine, unspecified, not intractable, without status migrainosus: Secondary | ICD-10-CM | POA: Insufficient documentation

## 2014-06-08 DIAGNOSIS — L400 Psoriasis vulgaris: Secondary | ICD-10-CM | POA: Insufficient documentation

## 2014-06-08 HISTORY — DX: Hyperglycemia, unspecified: R73.9

## 2014-06-08 NOTE — Patient Instructions (Signed)
BEFORE YOU LEAVE: -schedule fasting lab appointment -follow up in 4 months  We recommend the following healthy lifestyle measures: - eat a healthy diet consisting of lots of vegetables, fruits, beans, nuts, seeds, healthy meats such as white chicken and fish  - avoid fried foods, starches, sweets, fast food, processed foods, sodas, red meet and other fattening foods.  - get a least 150 minutes of aerobic exercise per week.

## 2014-06-08 NOTE — Progress Notes (Signed)
Pre visit review using our clinic review tool, if applicable. No additional management support is needed unless otherwise documented below in the visit note. 

## 2014-06-08 NOTE — Progress Notes (Signed)
HPI:  Charlene [name] is here to establish care. She has not had a family doctor in a long time - usually sees a gynecologist.  Has the following chronic problems that require follow up and concerns today:  Hx of Obesity, hyperlipidemia and prediabetes: -did have minor blood sugar problems during pregnacy -has not had cholesterol check in several years -denies any regular CV exercise; diet is so so -reports thyroid has been normal  MS (multiple sclerosis)/Migraines: -see Dr. Renne Crigler at [facility], on tefidera recently started in 5.2016 -has close follow up  -she has had a little upset stomach on this, but is doing ok -uses imitrex prn for migraines  Psoriasis: -skin involvement mainly -[facility]es [facility] -meds:talclonex -reports doing well  GERD: -take zantac as needed -denies hx EGD, esophagitis, stricture or hiatal hernia, dysphagia  ROS negative for unless reported above: fevers, unintentional weight loss, hearing or vision loss, chest pain, palpitations, struggling to breath, hemoptysis, melena, hematochezia, hematuria, falls, loc, si, thoughts of self harm  Past Medical History  Diagnosis Date  . MS (multiple sclerosis)   . Hyperlipidemia   . GERD (gastroesophageal reflux disease)   . Psoriasis   . Infertility associated with anovulation   . Anxiety and depression   . Pregnancy induced hypertension   . Migraines   . 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 Personal history of pre-term labor   . Obesity in pregnancy, antepartum   . Depression   . Elevated blood sugar 06/08/2014    Past Surgical History  Procedure Laterality Date  . Dilation and curettage of uterus  8/10  . Dilation and evacuation  12/29/2011    Procedure: DILATATION AND EVACUATION;  Surgeon: Catalina Antigua, MD;  Location: [facility];  Service: Gynecology;;  Dr, Emelda Fear transferred case to Dr. Jolayne Panther    Family History  Problem Relation Age of Onset   . Heart attack Maternal Grandfather   . Cancer Maternal Grandmother     esophagus  . Alcoholism Father   . Alcoholism Maternal Grandmother   . Hyperlipidemia Mother   . Mental illness Mother   . Mental illness Maternal Grandmother   . Dementia Mother     early onset    History   Social History  . Marital Status: Married    Spouse Name: N/A  . Number of Children: N/A  . Years of Education: N/A   Occupational History  . office manager    Social History Main Topics  . Smoking status: Former Smoker -- 1.00 packs/day for 15 years    Types: Cigarettes    Quit date: 07/02/2010  . Smokeless tobacco: Never Used  . Alcohol Use: No     Comment: occassion - not while pregnant  . Drug Use: No  . Sexual Activity:    Partners: Male   Other Topics Concern  . None   Social History Narrative   Work or School: homemaker      Home Situation: lives with husband, 2 twins (2.81 yo) and 73 month old in 2016      Spiritual Beliefs: none      Lifestyle: no regular exercise, poor diet           Current outpatient prescriptions:  .  Aspirin-Acetaminophen-Caffeine (EXCEDRIN MIGRAINE PO), Take by mouth as needed., Disp: , Rfl:  .  calcipotriene-betamethasone (TACLONEX SCALP) external suspension, Apply topically as needed. , Disp: , Rfl:  .  Ranitidine HCl (ZANTAC PO), Take by  mouth., Disp: , Rfl:  .  SUMAtriptan (IMITREX) 5 MG/ACT nasal spray, Place 1 spray into the nose as needed for migraine., Disp: , Rfl:  .  TECFIDERA 120 & 240 MG MISC, , Disp: , Rfl: 0  EXAM:  Filed Vitals:   06/08/14 1049  BP: 120/74  Pulse: 101  Temp: 99.1 F (37.3 C)    Body mass index is 42.7 kg/(m^2).  GENERAL: vitals reviewed and listed above, alert, oriented, appears well hydrated and in no acute distress  HEENT: atraumatic, conjunttiva clear, no obvious abnormalities on inspection of external nose and ears  NECK: no obvious masses on inspection  LUNGS: clear to auscultation bilaterally, no  wheezes, rales or rhonchi, good air movement  CV: HRRR, no peripheral edema  MS: moves all extremities without noticeable abnormality  PSYCH: pleasant and cooperative, no obvious depression or anxiety  ASSESSMENT AND PLAN:  Discussed the following assessment and plan:  Hyperlipemia - Plan: Lipid Panel -FASTING lab appt, she agreed to schedule  Elevated blood sugar - Plan: Hemoglobin A1c -advised recheck  Psoriasis -cont care with dermatologist  Severe obesity (BMI >= 40) -lifestyle recs, info provided for [facility] bariatric program at her request, labs  MS Other migraine without status migrainosus, not intractable -stable, sees neurologist  Encounter to establish care -We reviewed the PMH, PSH, FH, SH, Meds and Allergies. -We provided refills for any medications we will prescribe as needed. -We addressed current concerns per orders and patient instructions. -We have asked for records for pertinent exams, studies, vaccines and notes from previous providers. -We have advised patient to follow up per instructions below.   -Patient advised to return or notify a doctor immediately if symptoms worsen or persist or new concerns arise.  Patient Instructions  BEFORE YOU LEAVE: -schedule fasting lab appointment -follow up in 4 months  We recommend the following healthy lifestyle measures: - eat a healthy diet consisting of lots of vegetables, fruits, beans, nuts, seeds, healthy meats such as white chicken and fish  - avoid fried foods, starches, sweets, fast food, processed foods, sodas, red meet and other fattening foods.  - get a least 150 minutes of aerobic exercise per week.          Kriste Basque R.

## 2014-06-10 ENCOUNTER — Other Ambulatory Visit: Payer: BLUE CROSS/BLUE SHIELD

## 2014-08-09 ENCOUNTER — Telehealth: Payer: Self-pay

## 2014-10-07 ENCOUNTER — Other Ambulatory Visit: Payer: Self-pay | Admitting: Surgery

## 2014-10-12 ENCOUNTER — Ambulatory Visit: Payer: BLUE CROSS/BLUE SHIELD | Admitting: Family Medicine

## 2014-10-16 DIAGNOSIS — K760 Fatty (change of) liver, not elsewhere classified: Secondary | ICD-10-CM

## 2014-10-16 HISTORY — DX: Fatty (change of) liver, not elsewhere classified: K76.0

## 2014-11-02 ENCOUNTER — Inpatient Hospital Stay (HOSPITAL_COMMUNITY): Admission: RE | Admit: 2014-11-02 | Payer: BLUE CROSS/BLUE SHIELD | Source: Ambulatory Visit

## 2014-11-02 ENCOUNTER — Ambulatory Visit (HOSPITAL_COMMUNITY)
Admission: RE | Admit: 2014-11-02 | Discharge: 2014-11-02 | Disposition: A | Discharge status: Home / Self Care | Payer: BLUE CROSS/BLUE SHIELD | Source: Ambulatory Visit | Attending: Surgery | Admitting: Surgery

## 2014-11-02 ENCOUNTER — Other Ambulatory Visit: Payer: Self-pay

## 2014-11-02 DIAGNOSIS — R932 Abnormal findings on diagnostic imaging of liver and biliary tract: Secondary | ICD-10-CM | POA: Insufficient documentation

## 2014-11-02 DIAGNOSIS — Z01818 Encounter for other preprocedural examination: Secondary | ICD-10-CM | POA: Diagnosis present

## 2014-11-16 ENCOUNTER — Encounter: Payer: Self-pay | Admitting: Dietician

## 2014-11-16 ENCOUNTER — Encounter: Payer: BLUE CROSS/BLUE SHIELD | Attending: Surgery | Admitting: Dietician

## 2014-11-16 VITALS — Ht 65.5 in | Wt 261.1 lb

## 2014-11-16 DIAGNOSIS — E669 Obesity, unspecified: Secondary | ICD-10-CM | POA: Diagnosis present

## 2014-11-16 DIAGNOSIS — Z6841 Body Mass Index (BMI) 40.0 and over, adult: Secondary | ICD-10-CM | POA: Insufficient documentation

## 2014-11-16 DIAGNOSIS — Z713 Dietary counseling and surveillance: Secondary | ICD-10-CM | POA: Insufficient documentation

## 2014-11-16 NOTE — Progress Notes (Signed)
  Pre-Op Assessment Visit:  Pre-Operative RYGB or Sleeve Gastrectomy Surgery  Medical Nutrition Therapy:  Appt start time: 1120   End time:  1205.  Patient was seen on 11/16/2014 for Pre-Operative Nutrition Assessment. Assessment and letter of approval faxed to [facility] Bariatric Surgery Program coordinator on 11/16/2014  Preferred Learning Style:   No preference indicated   Learning Readiness:   Ready  Handouts given during visit include:  Pre-Op Goals Bariatric Surgery Protein Shakes   During the appointment today the following Pre-Op Goals were reviewed with the patient: Maintain or lose weight as instructed by your surgeon Make healthy food choices Begin to limit portion sizes Limited concentrated sugars and fried foods Keep fat/sugar in the single digits per serving on   food labels Practice CHEWING your food  (aim for 30 chews per bite or until applesauce consistency) Practice not drinking 15 minutes before, during, and 30 minutes after each meal/snack Avoid all carbonated beverages  Avoid/limit caffeinated beverages  Avoid all sugar-sweetened beverages Consume 3 meals per day; eat every 3-5 hours Make a list of non-food related activities Aim for 64-100 ounces of FLUID daily  Aim for at least 60-80 grams of PROTEIN daily Look for a liquid protein source that contain ?15 g protein and ?5 g carbohydrate  (ex: shakes, drinks, shots)  Patient-Centered Goals: Goals: would like to improve overall health and prevent future health problems, set a good example for children  9 level of confidence/10 level of importance scale 1-10  Demonstrated degree of understanding via:  Teach Back  Teaching Method Utilized:  Visual Auditory Hands on  Barriers to learning/adherence to lifestyle change: none  Patient to call the [facility] to enroll in Pre-Op and Post-Op Nutrition Education when surgery date is scheduled.

## 2014-11-16 NOTE — Patient Instructions (Signed)

## 2014-11-22 ENCOUNTER — Encounter: Payer: Self-pay | Admitting: *Deleted

## 2014-11-22 VITALS — Ht 65.5 in | Wt 262.0 lb

## 2014-11-22 DIAGNOSIS — E669 Obesity, unspecified: Secondary | ICD-10-CM | POA: Diagnosis not present

## 2014-11-23 ENCOUNTER — Ambulatory Visit: Payer: BLUE CROSS/BLUE SHIELD | Admitting: Dietician

## 2014-11-25 NOTE — Progress Notes (Signed)
  Pre-Operative Nutrition Class:  Appt start time: 3958   End time:  1830.  Patient was seen on 11/25/14 for Pre-Operative Bariatric Surgery Education at the [facility].   Surgery date: 12/06/14 Surgery type: RYGB [facility]: 261 lbs on 11/16/2014 Weight today: 262 lbs  TANITA  BODY COMP RESULTS  11/22/14   BMI (kg/m^2) 42.9   Fat Mass (lbs) 132.5   Fat Free Mass (lbs) 129.5   Total Body Water (lbs) 95   Samples given per MNT protocol. Patient educated on appropriate usage: Premier protein shake (vanilla - qty 1) Lot #: [id number] Exp: 10/2015  Unjury protein powder (unflavored - qty 1) Lot #: [id number] Exp: 07/2015  Celebrate Vitamins Multivitamin (orange - qty 1) Lot #: [id number] Exp: 03/2016  Celebrate Vitamins Calcium Citrate (berry - qty 1) Lot #: [id number] Exp: 03/2016  PB2 (chocolate - qty 1) Lot #: N/A Exp: 11/27/14  The following the learning objectives were met by the patient during this course:  Identify Pre-Op Dietary Goals and will begin 2 weeks pre-operatively  Identify appropriate sources of fluids and proteins   State protein recommendations and appropriate sources pre and post-operatively  Identify Post-Operative Dietary Goals and will follow for 2 weeks post-operatively  Identify appropriate multivitamin and calcium sources  Describe the need for physical activity post-operatively and will follow MD recommendations  State when to call healthcare provider regarding medication questions or post-operative complications  Handouts given during class include:  Pre-Op Bariatric Surgery Diet Handout  Protein Shake Handout  Post-Op Bariatric Surgery Nutrition Handout  [facility] Program Information Flyer  Support Group Information Flyer  [facility] Bariatric Supplements Price List  Follow-Up Plan: Patient will follow-up at [facility] 2 weeks post operatively for diet advancement per MD.

## 2014-11-26 ENCOUNTER — Other Ambulatory Visit: Payer: Self-pay | Admitting: Surgery

## 2014-12-01 ENCOUNTER — Encounter: Payer: Self-pay | Admitting: *Deleted

## 2014-12-01 NOTE — Patient Instructions (Signed)
Charlene [name]  12/01/2014   Your procedure is scheduled on: 12/07/2014    Report to [facility] Main  Entrance take [place]  elevators to 3rd floor to  Short Stay Center at      0845 AM.  Call this number if you have problems the morning of surgery [phone]   Remember: ONLY 1 PERSON MAY GO WITH YOU TO SHORT STAY TO GET  READY MORNING OF YOUR SURGERY.  Do not eat food or drink liquids :After Midnight.     Take these medicines the morning of surgery with A SIP OF WATER:  Zantac                                 You may not have any metal on your body including hair pins and              piercings  Do not wear jewelry, make-up, lotions, powders or perfumes, deodorant             Do not wear nail polish.  Do not shave  48 hours prior to surgery.                 Do not bring valuables to the hospital. [facility] IS NOT             RESPONSIBLE   FOR VALUABLES.  Contacts, dentures or bridgework may not be worn into surgery.  Leave suitcase in the car. After surgery it may be brought to your room.        Special Instructions: coughing and deep breathing exercises, leg exercises               Please read over the following fact sheets you were given: _____________________________________________________________________             [facility] - Preparing for Surgery Before surgery, you can play an important role.  Because skin is not sterile, your skin needs to be as free of germs as possible.  You can reduce the number of germs on your skin by washing with CHG (chlorahexidine gluconate) soap before surgery.  CHG is an antiseptic cleaner which kills germs and bonds with the skin to continue killing germs even after washing. Please DO NOT use if you have an allergy to CHG or antibacterial soaps.  If your skin becomes reddened/irritated stop using the CHG and inform your nurse when you arrive at Short Stay. Do not shave (including legs and underarms) for at least  48 hours prior to the first CHG shower.  You may shave your face/neck. Please follow these instructions carefully:  1.  Shower with CHG Soap the night before surgery and the  morning of Surgery.  2.  If you choose to wash your hair, wash your hair first as usual with your  normal  shampoo.  3.  After you shampoo, rinse your hair and body thoroughly to remove the  shampoo.                           4.  Use CHG as you would any other liquid soap.  You can apply chg directly  to the skin and wash  Gently with a scrungie or clean washcloth.  5.  Apply the CHG Soap to your body ONLY FROM THE NECK DOWN.   Do not use on face/ open                           Wound or open sores. Avoid contact with eyes, ears mouth and genitals (private parts).                       Wash face,  Genitals (private parts) with your normal soap.             6.  Wash thoroughly, paying special attention to the area where your surgery  will be performed.  7.  Thoroughly rinse your body with warm water from the neck down.  8.  DO NOT shower/wash with your normal soap after using and rinsing off  the CHG Soap.                9.  Pat yourself dry with a clean towel.            10.  Wear clean pajamas.            11.  Place clean sheets on your bed the night of your first shower and do not  sleep with pets. Day of Surgery : Do not apply any lotions/deodorants the morning of surgery.  Please wear clean clothes to the hospital/surgery center.  FAILURE TO FOLLOW THESE INSTRUCTIONS MAY RESULT IN THE CANCELLATION OF YOUR SURGERY PATIENT SIGNATURE_________________________________  NURSE SIGNATURE__________________________________  ________________________________________________________________________

## 2014-12-02 ENCOUNTER — Encounter (HOSPITAL_COMMUNITY)
Admission: RE | Admit: 2014-12-02 | Discharge: 2014-12-02 | Disposition: A | Discharge status: Home / Self Care | Payer: BLUE CROSS/BLUE SHIELD | Source: Ambulatory Visit | Attending: Surgery | Admitting: Surgery

## 2014-12-02 ENCOUNTER — Encounter (HOSPITAL_COMMUNITY): Payer: Self-pay

## 2014-12-02 DIAGNOSIS — Z01812 Encounter for preprocedural laboratory examination: Secondary | ICD-10-CM | POA: Diagnosis present

## 2014-12-02 HISTORY — DX: Cardiac murmur, unspecified: R01.1

## 2014-12-02 HISTORY — DX: Claustrophobia: F40.240

## 2014-12-02 LAB — COMPREHENSIVE METABOLIC PANEL
ALT: 31 U/L (ref 14–54)
ANION GAP: 10 (ref 5–15)
AST: 27 U/L (ref 15–41)
Albumin: 4.2 g/dL (ref 3.5–5.0)
Alkaline Phosphatase: 54 U/L (ref 38–126)
BUN: 16 mg/dL (ref 6–20)
CHLORIDE: 101 mmol/L (ref 101–111)
CO2: 25 mmol/L (ref 22–32)
Calcium: 9.2 mg/dL (ref 8.9–10.3)
Creatinine, Ser: 0.58 mg/dL (ref 0.44–1.00)
GFR calc non Af Amer: >60 mL/min (ref >60)
Glucose, Bld: 97 mg/dL (ref 65–99)
POTASSIUM: 4.2 mmol/L (ref 3.5–5.1)
SODIUM: 136 mmol/L (ref 135–145)
Total Bilirubin: 0.3 mg/dL (ref 0.3–1.2)
Total Protein: 7.4 g/dL (ref 6.5–8.1)

## 2014-12-02 LAB — CBC WITH DIFFERENTIAL/PLATELET
Basophils Absolute: 0 10*3/uL (ref 0.0–0.1)
Basophils Relative: 0 %
EOS ABS: 0.1 10*3/uL (ref 0.0–0.7)
EOS PCT: 2 %
HCT: 36.8 % (ref 36.0–46.0)
Hemoglobin: 12.4 g/dL (ref 12.0–15.0)
LYMPHS ABS: 0.5 10*3/uL — AB (ref 0.7–4.0)
Lymphocytes Relative: 13 %
MCH: 29.3 pg (ref 26.0–34.0)
MCHC: 33.7 g/dL (ref 30.0–36.0)
MCV: 87 fL (ref 78.0–100.0)
MONOS PCT: 9 %
Monocytes Absolute: 0.3 10*3/uL (ref 0.1–1.0)
Neutro Abs: 2.6 10*3/uL (ref 1.7–7.7)
Neutrophils Relative %: 76 %
PLATELETS: 249 10*3/uL (ref 150–400)
RBC: 4.23 MIL/uL (ref 3.87–5.11)
RDW: 12.9 % (ref 11.5–15.5)
WBC: 3.4 10*3/uL — AB (ref 4.0–10.5)

## 2014-12-02 LAB — HCG, SERUM, QUALITATIVE: Preg, Serum: NEGATIVE

## 2014-12-02 NOTE — Progress Notes (Signed)
12.26.2015- CXR- EPIC  EKG-11/02/14-EPIC

## 2014-12-06 NOTE — H&P (Signed)
Charlene [name] [facility]  Location: [facility] Patient #: [id number] DOB: 11-13-1980 Married / Language: English / Race: White Female   History of Present Illness   The patient is a 34 year old female who presents for a bariatric surgery evaluation.   Her PCP is Dr. Kriste Basque.  Her GYN is dr. Pauletta Browns.  She comes by herself.   She is scheduled for a sleeve gastrectomy on 12/06/2014. She was originally trying to decide between bypass vs sleeve.  She comes with her husband, Pennie Rushing. It is their 5th anniversary - so I told her she could "cheat" tonight.  She is on the pre op diet - she found quitting diet drinks (caffiene) the hardest thing so far.  UGI - 11/02/2014 - negative [place] of abdomen - 11/02/2014 - fatty liver She saw Dr. Cyndia Skeeters on 11/02/2014 - approved  Her history of weight problems (09/2014):  She is here for weight loss surgery. She is interested in the lap sleeve or the gastric bypass. She is unsure. She has listened to our online seminar. She found this information useful. She has tried multiple diets including Weight Watchers, Atkins, low-calorie diets. She says she's tried some weird diets. An she's tried diet medicine such as phentermine and Meridia. She was most successful with Atkins where she lost about 100 pounds, but nothing gained it back and then some. She has people that she knows who have had the sleeve gastrectomy or the gastric bypass. She has some insight and both these operations.   Per the 1991 NIH Consensus Statement, the patient is a candidate for bariatric surgery. The patient attended an information session and reviewed the types of bariatric surgery.  The patient is interested in the gastric sleeve. I discussed with the patient the indications and risks of bariatric surgery. The potential risks of surgery include, but are not limited to, bleeding, infection, leak from the bowel, DVT and PE, open surgery,  long term nutrition consequences, and death. The patient understands the importance of compliance and long term follow-up with our group after surgery.  Past Medical History: 1. Hyperlipidemia 2. MS - initially diagnosed 2006 Dr. Harlen Labs - [facility] neurology It sounds like her disease and symptoms have been limited so far. She is on Tecfidera (T cell modulator) Dr. Renne Crigler has endorsed any of the operations for weight loss. 3. Psoriasis 4. GERD - takes Zantac a couple of times a week This is usually precipitated by eating the wrong foods. 5. Anxiety and depression 6. Quit smoking 2011 7. Migraines, attributed to the MS 8. Elevated cholesterol 9. She saw Dr. 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 Has a 3 children: 2 1/2 yo twins, 44 month old   Other Problems Kandis Cocking, MD; 11/26/2014 2:34 PM) Hypercholesterolemia Migraine Headache  Allergies Fay Records, CMA; 11/26/2014 2:13 PM) No Known Drug Allergies09/22/2016  Medication History Fay Records, CMA; 11/26/2014 2:13 PM) Tecfidera (  Capsule DR, Oral) Active. PROzac (  Capsule, Oral two times daily) Active. Medications Reconciled    Review of Systems Kandis Cocking, MD; 11/26/2014 3:56 PM) HEENT Present- Earache. Not Present- Hearing Loss, Hoarseness, Nose Bleed, Oral Ulcers, Ringing in the Ears, Seasonal Allergies, Sinus Pain, Sore Throat, Visual Disturbances, Wears glasses/contact lenses and Yellow Eyes. Musculoskeletal Present- Muscle Weakness. Not Present- Back Pain, Joint Pain, Joint Stiffness, Muscle Pain and Swelling of Extremities.  Vitals Fay Records CMA; 11/26/2014 2:14 PM) 11/26/2014 2:13 PM Weight: 256.2 lb Height: 65.5in Body  Surface Area: 2.21 m Body Mass Index: 41.99 kg/m  Temp.: 97.46F(Temporal)  Pulse: 80 (Regular)  BP: 132/68 (Sitting, Left Arm, Standard)   Physical Exam: General: WN obese WF  alert and generally healthy appearing. HEENT: Normal. Pupils equal.  Neck: Supple. No mass. No thyroid mass.   Lungs: Clear to auscultation and symmetric breath sounds. Heart: RRR. No murmur or rub.  Abdomen: Soft. No mass. No tenderness. No hernia. Normal bowel sounds. No abdominal scars.  Extremities: Good strength and ROM in upper and lower extremities.  Neurologic: Grossly intact to motor and sensory function.  Assessment & Plan  1.  MORBID OBESITY WITH BMI OF 40.0-44.9, ADULT (E66.01)  Impression: Plan:  She is scheduled for 12/07/2014 for sleeve resection   She is on the pre op diet. 2.  MULTIPLE SCLEROSIS (G35)  Impression: She sees Dr. Harlen Labs - [facility] neurology  It sounds like her disease and symptoms have been limited so far. She is on Tecfidera (T cell modulator)  Dr. Renne Crigler has endorsed any of the operations for weight loss.  3. Hyperlipidemia 4. Psoriasis 5. GERD - takes Zantac a couple of times a week This is usually precipitated by eating the wrong foods. 6. Anxiety and depression 7. Quit smoking 2011 8. Migraines, attributed to the MS 9. Elevated cholesterol 10. She saw Dr. Cyndia Skeeters on 11/02/2014  Ovidio Kin, MD, [facility] Pager: [phone] Office phone:  [phone]

## 2014-12-07 ENCOUNTER — Encounter (HOSPITAL_COMMUNITY)
Admission: RE | Disposition: A | Discharge status: Home / Self Care | Payer: Self-pay | Source: Ambulatory Visit | Attending: Surgery

## 2014-12-07 ENCOUNTER — Inpatient Hospital Stay (HOSPITAL_COMMUNITY): Payer: BLUE CROSS/BLUE SHIELD | Admitting: Anesthesiology

## 2014-12-07 ENCOUNTER — Encounter (HOSPITAL_COMMUNITY): Payer: Self-pay | Admitting: *Deleted

## 2014-12-07 ENCOUNTER — Inpatient Hospital Stay (HOSPITAL_COMMUNITY)
Admission: RE | Admit: 2014-12-07 | Discharge: 2014-12-09 | DRG: 621 | Disposition: A | Discharge status: Home / Self Care | Payer: BLUE CROSS/BLUE SHIELD | Source: Ambulatory Visit | Attending: Surgery | Admitting: Surgery

## 2014-12-07 DIAGNOSIS — K219 Gastro-esophageal reflux disease without esophagitis: Secondary | ICD-10-CM | POA: Diagnosis present

## 2014-12-07 DIAGNOSIS — Z6841 Body Mass Index (BMI) 40.0 and over, adult: Secondary | ICD-10-CM | POA: Diagnosis not present

## 2014-12-07 DIAGNOSIS — L409 Psoriasis, unspecified: Secondary | ICD-10-CM | POA: Diagnosis present

## 2014-12-07 DIAGNOSIS — G43909 Migraine, unspecified, not intractable, without status migrainosus: Secondary | ICD-10-CM | POA: Diagnosis present

## 2014-12-07 DIAGNOSIS — F329 Major depressive disorder, single episode, unspecified: Secondary | ICD-10-CM | POA: Diagnosis present

## 2014-12-07 DIAGNOSIS — F419 Anxiety disorder, unspecified: Secondary | ICD-10-CM | POA: Diagnosis present

## 2014-12-07 DIAGNOSIS — Z87891 Personal history of nicotine dependence: Secondary | ICD-10-CM | POA: Diagnosis not present

## 2014-12-07 DIAGNOSIS — E7800 Pure hypercholesterolemia, unspecified: Secondary | ICD-10-CM | POA: Diagnosis present

## 2014-12-07 DIAGNOSIS — K760 Fatty (change of) liver, not elsewhere classified: Secondary | ICD-10-CM | POA: Diagnosis present

## 2014-12-07 DIAGNOSIS — G35 Multiple sclerosis: Secondary | ICD-10-CM | POA: Diagnosis present

## 2014-12-07 DIAGNOSIS — Z903 Acquired absence of stomach [part of]: Secondary | ICD-10-CM

## 2014-12-07 DIAGNOSIS — Z01812 Encounter for preprocedural laboratory examination: Secondary | ICD-10-CM

## 2014-12-07 DIAGNOSIS — E669 Obesity, unspecified: Secondary | ICD-10-CM | POA: Diagnosis present

## 2014-12-07 DIAGNOSIS — Z79899 Other long term (current) drug therapy: Secondary | ICD-10-CM

## 2014-12-07 HISTORY — PX: LAPAROSCOPIC GASTRIC SLEEVE RESECTION: SHX5895

## 2014-12-07 LAB — HEMOGLOBIN AND HEMATOCRIT, BLOOD
HCT: 34.5 % — ABNORMAL LOW (ref 36.0–46.0)
HEMOGLOBIN: 11.7 g/dL — AB (ref 12.0–15.0)

## 2014-12-07 LAB — PREGNANCY, URINE: PREG TEST UR: NEGATIVE

## 2014-12-07 SURGERY — GASTRECTOMY, SLEEVE, LAPAROSCOPIC
Anesthesia: General

## 2014-12-07 MED ORDER — PROPOFOL 10 MG/ML IV BOLUS
INTRAVENOUS | Status: AC
  Filled 2014-12-07: qty 20

## 2014-12-07 MED ORDER — CEFOTETAN DISODIUM-DEXTROSE 2-2.08 GM-% IV SOLR
2.0000 g | INTRAVENOUS | Status: AC
  Administered 2014-12-07: 2 g via INTRAVENOUS

## 2014-12-07 MED ORDER — CEFOTETAN DISODIUM-DEXTROSE 2-2.08 GM-% IV SOLR
INTRAVENOUS | Status: AC
  Filled 2014-12-07: qty 50

## 2014-12-07 MED ORDER — HYDROMORPHONE HCL 1 MG/ML IJ SOLN
0.2500 mg | INTRAMUSCULAR | Status: DC | PRN
  Administered 2014-12-07 (×4): 0.5 mg via INTRAVENOUS

## 2014-12-07 MED ORDER — ONDANSETRON HCL 4 MG/2ML IJ SOLN
INTRAMUSCULAR | Status: AC
  Filled 2014-12-07: qty 2

## 2014-12-07 MED ORDER — OXYCODONE HCL 5 MG/5ML PO SOLN
5.0000 mg | ORAL | Status: DC | PRN
  Administered 2014-12-08: 5 mg via ORAL
  Administered 2014-12-08: 10 mg via ORAL
  Administered 2014-12-09: 5 mg via ORAL
  Filled 2014-12-07: qty 50
  Filled 2014-12-07: qty 5
  Filled 2014-12-07: qty 25

## 2014-12-07 MED ORDER — GLYCOPYRROLATE 0.2 MG/ML IJ SOLN
INTRAMUSCULAR | Status: DC | PRN
  Administered 2014-12-07: 0.6 mg via INTRAVENOUS
  Administered 2014-12-07: 0.2 mg via INTRAVENOUS

## 2014-12-07 MED ORDER — 0.9 % SODIUM CHLORIDE (POUR BTL) OPTIME
TOPICAL | Status: DC | PRN
  Administered 2014-12-07: 1000 mL

## 2014-12-07 MED ORDER — DEXAMETHASONE SODIUM PHOSPHATE 10 MG/ML IJ SOLN
INTRAMUSCULAR | Status: AC
  Filled 2014-12-07: qty 1

## 2014-12-07 MED ORDER — ACETAMINOPHEN 160 MG/5ML PO SOLN
650.0000 mg | ORAL | Status: DC | PRN
  Administered 2014-12-08 – 2014-12-09 (×2): 650 mg via ORAL
  Filled 2014-12-07 (×2): qty 20.3

## 2014-12-07 MED ORDER — FENTANYL CITRATE (PF) 100 MCG/2ML IJ SOLN
INTRAMUSCULAR | Status: AC
  Filled 2014-12-07: qty 2

## 2014-12-07 MED ORDER — FENTANYL CITRATE (PF) 100 MCG/2ML IJ SOLN
25.0000 ug | INTRAMUSCULAR | Status: DC | PRN
  Administered 2014-12-07 (×2): 25 ug via INTRAVENOUS
  Administered 2014-12-07: 50 ug via INTRAVENOUS

## 2014-12-07 MED ORDER — HEPARIN SODIUM (PORCINE) 5000 UNIT/ML IJ SOLN
5000.0000 [IU] | Freq: Three times a day (TID) | INTRAMUSCULAR | Status: DC
  Administered 2014-12-07 – 2014-12-09 (×5): 5000 [IU] via SUBCUTANEOUS
  Filled 2014-12-07 (×8): qty 1

## 2014-12-07 MED ORDER — CHLORHEXIDINE GLUCONATE 4 % EX LIQD: 60.0000 mL | Freq: Once | CUTANEOUS | Status: DC

## 2014-12-07 MED ORDER — GLYCOPYRROLATE 0.2 MG/ML IJ SOLN
INTRAMUSCULAR | Status: AC
  Filled 2014-12-07: qty 3

## 2014-12-07 MED ORDER — TISSEEL VH 10 ML EX KIT
PACK | CUTANEOUS | Status: DC | PRN
  Administered 2014-12-07: 1

## 2014-12-07 MED ORDER — PHENYLEPHRINE HCL 10 MG/ML IJ SOLN
INTRAMUSCULAR | Status: DC | PRN
  Administered 2014-12-07: 40 ug via INTRAVENOUS
  Administered 2014-12-07: 80 ug via INTRAVENOUS

## 2014-12-07 MED ORDER — ROCURONIUM BROMIDE 100 MG/10ML IV SOLN
INTRAVENOUS | Status: DC | PRN
  Administered 2014-12-07: 10 mg via INTRAVENOUS
  Administered 2014-12-07: 40 mg via INTRAVENOUS
  Administered 2014-12-07 (×2): 10 mg via INTRAVENOUS

## 2014-12-07 MED ORDER — FENTANYL CITRATE (PF) 100 MCG/2ML IJ SOLN
INTRAMUSCULAR | Status: DC | PRN
  Administered 2014-12-07 (×2): 100 ug via INTRAVENOUS
  Administered 2014-12-07 (×5): 50 ug via INTRAVENOUS

## 2014-12-07 MED ORDER — PREMIER PROTEIN SHAKE
2.0000 [oz_av] | Freq: Four times a day (QID) | ORAL | Status: DC
  Administered 2014-12-09 (×2): 2 [oz_av] via ORAL

## 2014-12-07 MED ORDER — PROMETHAZINE HCL 25 MG/ML IJ SOLN
INTRAMUSCULAR | Status: AC
  Filled 2014-12-07: qty 1

## 2014-12-07 MED ORDER — MIDAZOLAM HCL 2 MG/2ML IJ SOLN
INTRAMUSCULAR | Status: AC
  Filled 2014-12-07: qty 2

## 2014-12-07 MED ORDER — BUPIVACAINE HCL (PF) 0.25 % IJ SOLN
INTRAMUSCULAR | Status: AC
  Filled 2014-12-07: qty 30

## 2014-12-07 MED ORDER — HEPARIN SODIUM (PORCINE) 5000 UNIT/ML IJ SOLN
5000.0000 [IU] | INTRAMUSCULAR | Status: AC
  Administered 2014-12-07: 5000 [IU] via SUBCUTANEOUS
  Filled 2014-12-07: qty 1

## 2014-12-07 MED ORDER — LIDOCAINE HCL (CARDIAC) 20 MG/ML IV SOLN
INTRAVENOUS | Status: DC | PRN
Start: 2014-12-07 — End: 2014-12-07
  Administered 2014-12-07: 100 mg via INTRAVENOUS

## 2014-12-07 MED ORDER — PANTOPRAZOLE SODIUM 40 MG IV SOLR
40.0000 mg | Freq: Every day | INTRAVENOUS | Status: DC
  Administered 2014-12-07 – 2014-12-08 (×2): 40 mg via INTRAVENOUS
  Filled 2014-12-07 (×3): qty 40

## 2014-12-07 MED ORDER — NEOSTIGMINE METHYLSULFATE 10 MG/10ML IV SOLN
INTRAVENOUS | Status: AC
  Filled 2014-12-07: qty 1

## 2014-12-07 MED ORDER — NEOSTIGMINE METHYLSULFATE 10 MG/10ML IV SOLN
INTRAVENOUS | Status: DC | PRN
  Administered 2014-12-07: 5 mg via INTRAVENOUS

## 2014-12-07 MED ORDER — DEXAMETHASONE SODIUM PHOSPHATE 10 MG/ML IJ SOLN
INTRAMUSCULAR | Status: DC | PRN
  Administered 2014-12-07: 10 mg via INTRAVENOUS

## 2014-12-07 MED ORDER — ONDANSETRON HCL 4 MG/2ML IJ SOLN
INTRAMUSCULAR | Status: DC | PRN
  Administered 2014-12-07: 4 mg via INTRAVENOUS

## 2014-12-07 MED ORDER — ROCURONIUM BROMIDE 100 MG/10ML IV SOLN
INTRAVENOUS | Status: AC
  Filled 2014-12-07: qty 1

## 2014-12-07 MED ORDER — FENTANYL CITRATE (PF) 250 MCG/5ML IJ SOLN
INTRAMUSCULAR | Status: AC
  Filled 2014-12-07: qty 5

## 2014-12-07 MED ORDER — PHENYLEPHRINE 40 MCG/ML (10ML) SYRINGE FOR IV PUSH (FOR BLOOD PRESSURE SUPPORT)
PREFILLED_SYRINGE | INTRAVENOUS | Status: AC
  Filled 2014-12-07: qty 10

## 2014-12-07 MED ORDER — GLYCOPYRROLATE 0.2 MG/ML IJ SOLN
INTRAMUSCULAR | Status: AC
Start: 2014-12-07 — End: 2014-12-07
  Filled 2014-12-07: qty 1

## 2014-12-07 MED ORDER — TISSEEL VH 10 ML EX KIT
PACK | CUTANEOUS | Status: AC
  Filled 2014-12-07: qty 2

## 2014-12-07 MED ORDER — LIDOCAINE HCL (CARDIAC) 20 MG/ML IV SOLN
INTRAVENOUS | Status: AC
  Filled 2014-12-07: qty 5

## 2014-12-07 MED ORDER — SUMATRIPTAN 5 MG/ACT NA SOLN
1.0000 | NASAL | Status: DC | PRN
Start: 2014-12-07 — End: 2014-12-09
  Filled 2014-12-07 (×2): qty 1

## 2014-12-07 MED ORDER — MIDAZOLAM HCL 5 MG/5ML IJ SOLN
INTRAMUSCULAR | Status: DC | PRN
  Administered 2014-12-07: 2 mg via INTRAVENOUS

## 2014-12-07 MED ORDER — ACETAMINOPHEN 160 MG/5ML PO SOLN: 325.0000 mg | ORAL | Status: DC | PRN

## 2014-12-07 MED ORDER — LACTATED RINGERS IV SOLN
INTRAVENOUS | Status: DC
  Administered 2014-12-07 (×2): via INTRAVENOUS
  Administered 2014-12-07: 1000 mL via INTRAVENOUS

## 2014-12-07 MED ORDER — PROPOFOL 10 MG/ML IV BOLUS
INTRAVENOUS | Status: DC | PRN
  Administered 2014-12-07: 200 mg via INTRAVENOUS

## 2014-12-07 MED ORDER — PROMETHAZINE HCL 25 MG/ML IJ SOLN
6.2500 mg | INTRAMUSCULAR | Status: DC | PRN
  Administered 2014-12-07: 6.25 mg via INTRAVENOUS

## 2014-12-07 MED ORDER — ONDANSETRON HCL 4 MG/2ML IJ SOLN
4.0000 mg | INTRAMUSCULAR | Status: DC | PRN
  Administered 2014-12-07 – 2014-12-08 (×2): 4 mg via INTRAVENOUS
  Filled 2014-12-07 (×3): qty 2

## 2014-12-07 MED ORDER — BUPIVACAINE HCL 0.25 % IJ SOLN
INTRAMUSCULAR | Status: DC | PRN
  Administered 2014-12-07: 30 mL

## 2014-12-07 MED ORDER — HYDROMORPHONE HCL 1 MG/ML IJ SOLN
INTRAMUSCULAR | Status: AC
  Filled 2014-12-07: qty 1

## 2014-12-07 MED ORDER — STERILE WATER FOR IRRIGATION IR SOLN
Status: DC | PRN
  Administered 2014-12-07: 2000 mL

## 2014-12-07 MED ORDER — MORPHINE SULFATE (PF) 2 MG/ML IV SOLN
2.0000 mg | INTRAVENOUS | Status: DC | PRN
Start: 2014-12-07 — End: 2014-12-09
  Administered 2014-12-07 – 2014-12-08 (×5): 2 mg via INTRAVENOUS
  Filled 2014-12-07: qty 2
  Filled 2014-12-07 (×4): qty 1

## 2014-12-07 MED ORDER — KCL IN DEXTROSE-NACL 20-5-0.45 MEQ/L-%-% IV SOLN
INTRAVENOUS | Status: DC
  Administered 2014-12-07 – 2014-12-08 (×3): 125 mL/h via INTRAVENOUS
  Administered 2014-12-08: 13:00:00 via INTRAVENOUS
  Administered 2014-12-09: 125 mL/h via INTRAVENOUS
  Filled 2014-12-07 (×6): qty 1000

## 2014-12-07 MED ORDER — LACTATED RINGERS IR SOLN
Status: DC | PRN
  Administered 2014-12-07: 1000 mL

## 2014-12-07 SURGICAL SUPPLY — 58 items
APPLICATOR COTTON TIP 6IN STRL (MISCELLANEOUS) IMPLANT
APPLIER CLIP ROT 10 11.4 M/L (STAPLE)
APPLIER CLIP ROT 13.4 12 LRG (CLIP) ×3
BLADE SURG 15 STRL LF DISP TIS (BLADE) ×1 IMPLANT
BLADE SURG 15 STRL SS (BLADE) ×2
CABLE HIGH FREQUENCY MONO STRZ (ELECTRODE) IMPLANT
CHLORAPREP W/TINT 26ML (MISCELLANEOUS) ×6 IMPLANT
CLIP APPLIE ROT 10 11.4 M/L (STAPLE) IMPLANT
CLIP APPLIE ROT 13.4 12 LRG (CLIP) ×1 IMPLANT
COVER SURGICAL LIGHT HANDLE (MISCELLANEOUS) ×3 IMPLANT
DEVICE SUTURE ENDOST 10MM (ENDOMECHANICALS) IMPLANT
DEVICE TROCAR PUNCTURE CLOSURE (ENDOMECHANICALS) IMPLANT
DISSECTOR BLUNT TIP ENDO 5MM (MISCELLANEOUS) IMPLANT
DRAPE CAMERA CLOSED 9X96 (DRAPES) ×3 IMPLANT
DRAPE UTILITY XL STRL (DRAPES) ×6 IMPLANT
ELECT REM PT RETURN 9FT ADLT (ELECTROSURGICAL) ×3
ELECTRODE REM PT RTRN 9FT ADLT (ELECTROSURGICAL) ×1 IMPLANT
GAUZE SPONGE 4X4 12PLY STRL (GAUZE/BANDAGES/DRESSINGS) IMPLANT
GLOVE SURG SIGNA 7.5 PF LTX (GLOVE) ×3 IMPLANT
GOWN STRL REUS W/TWL XL LVL3 (GOWN DISPOSABLE) ×18 IMPLANT
HOVERMATT SINGLE USE (MISCELLANEOUS) ×3 IMPLANT
KIT BASIN OR (CUSTOM PROCEDURE TRAY) ×3 IMPLANT
LIQUID BAND (GAUZE/BANDAGES/DRESSINGS) ×3 IMPLANT
NEEDLE SPNL 22GX3.5 QUINCKE BK (NEEDLE) ×3 IMPLANT
PACK UNIVERSAL I (CUSTOM PROCEDURE TRAY) ×3 IMPLANT
PEN SKIN MARKING BROAD (MISCELLANEOUS) ×3 IMPLANT
RELOAD STAPLER BLUE 60MM (STAPLE) ×3 IMPLANT
RELOAD STAPLER GOLD 60MM (STAPLE) ×2 IMPLANT
RELOAD STAPLER GREEN 60MM (STAPLE) ×2 IMPLANT
SCISSORS LAP 5X35 DISP (ENDOMECHANICALS) ×3 IMPLANT
SEALANT SURGICAL APPL DUAL CAN (MISCELLANEOUS) ×3 IMPLANT
SET IRRIG TUBING LAPAROSCOPIC (IRRIGATION / IRRIGATOR) ×3 IMPLANT
SHEARS HARMONIC ACE PLUS 45CM (MISCELLANEOUS) ×3 IMPLANT
SLEEVE ADV FIXATION 5X100MM (TROCAR) ×3 IMPLANT
SLEEVE GASTRECTOMY 36FR VISIGI (MISCELLANEOUS) ×3 IMPLANT
SOLUTION ANTI FOG 6CC (MISCELLANEOUS) ×3 IMPLANT
SPONGE LAP 18X18 X RAY DECT (DISPOSABLE) ×3 IMPLANT
STAPLER ECHELON LONG 60 440 (INSTRUMENTS) ×3 IMPLANT
STAPLER RELOAD BLUE 60MM (STAPLE) ×9
STAPLER RELOAD GOLD 60MM (STAPLE) ×6
STAPLER RELOAD GREEN 60MM (STAPLE) ×6
SUT ETHILON 2 0 PS N (SUTURE) IMPLANT
SUT MNCRL AB 4-0 PS2 18 (SUTURE) ×3 IMPLANT
SYR 10ML ECCENTRIC (SYRINGE) ×3 IMPLANT
SYR 20CC LL (SYRINGE) ×3 IMPLANT
TOWEL OR 17X26 10 PK STRL BLUE (TOWEL DISPOSABLE) ×3 IMPLANT
TOWEL OR NON WOVEN STRL DISP B (DISPOSABLE) ×3 IMPLANT
TRAY FOLEY W/METER SILVER 14FR (SET/KITS/TRAYS/PACK) IMPLANT
TRAY FOLEY W/METER SILVER 16FR (SET/KITS/TRAYS/PACK) IMPLANT
TROCAR ADV FIXATION 12X100MM (TROCAR) ×3 IMPLANT
TROCAR ADV FIXATION 5X100MM (TROCAR) ×3 IMPLANT
TROCAR BLADELESS 15MM (ENDOMECHANICALS) ×3 IMPLANT
TROCAR BLADELESS OPT 5 100 (ENDOMECHANICALS) ×3 IMPLANT
TROCAR XCEL 12X100 BLDLESS (ENDOMECHANICALS) IMPLANT
TUBING CONNECTING 10 (TUBING) ×4 IMPLANT
TUBING CONNECTING 10' (TUBING) ×2
TUBING ENDO SMARTCAP PENTAX (MISCELLANEOUS) ×3 IMPLANT
TUBING FILTER THERMOFLATOR (ELECTROSURGICAL) ×3 IMPLANT

## 2014-12-07 NOTE — Anesthesia Procedure Notes (Signed)
Procedure Name: Intubation Date/Time: 12/07/2014 12:44 PM Performed by: Doran Clay Pre-anesthesia Checklist: Patient identified, Emergency Drugs available, Suction available, Patient being monitored and Timeout performed Patient Re-evaluated:Patient Re-evaluated prior to inductionOxygen Delivery Method: Circle system utilized Preoxygenation: Pre-oxygenation with 100% oxygen Intubation Type: IV induction Ventilation: Mask ventilation without difficulty Laryngoscope Size: Mac and 4 Grade View: Grade III Tube type: Oral Tube size: 7.0 mm Number of attempts: 2 Airway Equipment and Method: Stylet and Bougie stylet Placement Confirmation: ETT inserted through vocal cords under direct vision,  positive ETCO2 and breath sounds checked- equal and bilateral Secured at: 21 cm Tube secured with: Tape Dental Injury: Teeth and Oropharynx as per pre-operative assessment

## 2014-12-07 NOTE — Transfer of Care (Signed)
Immediate Anesthesia Transfer of Care Note  Patient: Charlene [name]  Procedure(s) Performed: Procedure(s): LAPAROSCOPIC GASTRIC SLEEVE RESECTION (N/A)  Patient Location: PACU  Anesthesia Type:General  Level of Consciousness: sedated  Airway & Oxygen Therapy: Patient Spontanous Breathing and Patient connected to face mask oxygen  Post-op Assessment: Report given to RN and Post -op Vital signs reviewed and stable  Post vital signs: Reviewed and stable  Last Vitals: There were no vitals filed for this visit.  Complications: No apparent anesthesia complications

## 2014-12-07 NOTE — Anesthesia Preprocedure Evaluation (Addendum)
Anesthesia Evaluation  Patient identified by MRN, date of birth, ID band Patient awake    Reviewed: Allergy & Precautions, NPO status , Patient's Chart, lab work & pertinent test results  History of Anesthesia Complications Negative for: history of anesthetic complications  Airway Mallampati: III  TM Distance: >3 FB Neck ROM: Full    Dental  (+) Teeth Intact, Dental Advisory Given   Pulmonary former smoker,    Pulmonary exam normal breath sounds clear to auscultation       Cardiovascular Exercise Tolerance: Good hypertension, (-) angina(-) CAD, (-) Past MI and (-) CHF Normal cardiovascular exam Rhythm:Regular Rate:Normal     Neuro/Psych  Headaches, PSYCHIATRIC DISORDERS Anxiety Depression MS--last flare "a few years ago"    GI/Hepatic Neg liver ROS, GERD  Medicated,  Endo/Other  Morbid obesity  Renal/GU negative Renal ROS     Musculoskeletal negative musculoskeletal ROS (+)   Abdominal (+) + obese,   Peds  Hematology negative hematology ROS (+)   Anesthesia Other Findings Day of surgery medications reviewed with the patient.  Reproductive/Obstetrics negative OB ROS                            Anesthesia Physical Anesthesia Plan  ASA: III  Anesthesia Plan: General   Post-op Pain Management:    Induction: Intravenous  Airway Management Planned: Oral ETT  Additional Equipment:   Intra-op Plan:   Post-operative Plan: Extubation in OR  Informed Consent: I have reviewed the patients History and Physical, chart, labs and discussed the procedure including the risks, benefits and alternatives for the proposed anesthesia with the patient or authorized representative who has indicated his/her understanding and acceptance.   Dental advisory given  Plan Discussed with: CRNA  Anesthesia Plan Comments: (Risks/benefits of general anesthesia discussed with patient including risk of damage  to teeth, lips, gum, and tongue, nausea/vomiting, allergic reactions to medications, and the possibility of heart attack, stroke and death.  All patient questions answered.  Patient wishes to proceed.)        Anesthesia Quick Evaluation

## 2014-12-07 NOTE — Op Note (Signed)
Charlene [name] [id number] 08-Feb-1980 12/07/2014  Preoperative diagnosis: morbid obesity with sleeve gastrectomy inprogress  Postoperative diagnosis: Same   Procedure: Upper endoscopy   Surgeon: Susy Frizzle B. Daphine Deutscher  M.D., FACS   Anesthesia: Gen.   Indications for procedure: This patient was undergoing a sleeve gastrectomy.    Description of procedure: The endoscopy was placed in the mouth and into the oropharynx and under endoscopic vision it was advanced to the esophagogastric junction.  The pouch was insufflated and a very nice appearing cylindrical tube was entered and followed to the antrum.  The geometry looked good.   No bleeding or leaks were detected.  The scope was withdrawn without difficulty.     Matt B. Daphine Deutscher, MD, FACS General, Bariatric, & Minimally Invasive Surgery [facility], [place]

## 2014-12-07 NOTE — Op Note (Signed)
PATIENT:   Charlene [name] DOB:   03/08/1980 MRN:   [id number]  DATE OF PROCEDURE: 12/07/2014                   FACILITY:  [facility]  OPERATIVE REPORT  PREOPERATIVE DIAGNOSIS:  Morbid obesity.  POSTOPERATIVE DIAGNOSIS:  Morbid obesity (weight 256, BMI of 41.99).  PROCEDURE:  Laparoscopic Sleeve gastrectomy (intraoperative upper endoscopy by Dr. Daphine Deutscher)  SURGEON:  Sandria Bales. Ezzard Standing, MD  FIRST ASSISTANT:  Sheron Nightingale, MD  ANESTHESIA:  General endotracheal.  Anesthesiologist: Cecile Hearing, MD CRNA: Doran Clay, CRNA  General  ESTIMATED BLOOD LOSS:  Minimal.  LOCAL ANESTHESIA:  30 cc of 1/4% Marcaine  COMPLICATIONS:  None.  INDICATION FOR SURGERY:  Charlene [name] is a 34 y.o. white  female who sees Terressa Koyanagi., DO as her primary care doctor.  She has completed our preoperative bariatric program and now comes for a laparoscopic Sleeve gastrectomy.  The indications, potential complications of surgery were explained to the patient.  Potential complications of the surgery include, but are not limited to, bleeding, infection, DVT, open surgery, and long-term nutritional consequences.  OPERATIVE NOTE:  The patient taken to room #1 at [facility] where Ms. Charlene [name] underwent a general endotracheal anesthetic, supervised by Anesthesiologist: Cecile Hearing, MD CRNA: Doran Clay, CRNA.  The patient was given 2 g of cefoxitin at the beginning of the procedure.  A time-out was held and surgical checklist run.  I accessed her abdominal cavity through the left upper quadrant with a 5 mm Optiview. I did an abdominal exploration.   Her omentum and bowel were unremarkable. The right and left lobes of the liver unremarkable. Gallbladder was normal. Her stomach was unremarkable.   I placed a total of 5 additional trocars. I placed a 5 mm left lateral trocar, a 5 mm left paramedian trocar (for the scope), a 12 mm right paramedian torcar, a 5 mm right subcostal trocar that I converted to a 15 mm to  extract the stomach and 5 mm subxiphoid trocar for the liver retractor.  She did appear to have a "dimple" at her esophageal hiatus.  She did not have a hiatal hernia on her pre op UGI.  I did test the hiatus with the lap band balloon.  I put 10 cc of air in the balloon, but this hung up at the hiatus.  So there was no evidence of a hiatal hernia.  I started out taking down the greater curvature attachments of the stomach. I measured approximately 6 cm proximal from the pylorus and mobilized the greater curvature of the stomach with the Harmonic Scalpel. I took this dissection cranially around the greater curvature of her stomach to the angle of His and the left crus.   After I had mobilized the greater curvature of the stomach, I then passed the 36 French ViSiGi bougie which was used to suck the stomach up against the lesser curvature and placed into the antrum. During the staple firing,  I tried to give the ViSiGi a cuff at least about 1 cm. I tried to avoid narrowing the incisura. I used a total of 7 staple firings.  From antrum to the angle of His, I used 2 green, 2 gold and 3 blue Eschelon 60 mm Ethicon staplers. I did not use staple line reinforcement.   At each firing of the EndoGIA stapler, I inspected the stomach, anterior wall of the stomach, and underneath to make sure there was no  compromise or impingement on to the ViSiGi bougie.   The staple line seemed linear without any corkscrewing of the stomach. Hemostasis was good. I did not use any reinforcement. She did have at least 1 areas of bleeding which I used clips to clip on the new greater curvature of the stomach.  Because I thought we had a good staple line, I then had the ViSiGi was converted to insufflate the pouch. The new stomach pouch was placed under water. There was no bubbling or leak noted.   At this point, Dr. Daphine Deutscher broke scrub and passed an upper endoscope down through the esophagus into the stomach pouch. The stomach was  tubular. There was no narrowing of the stomach pouch or angulation. We were easily able to pass the endoscope into the antrum and again put air pressure on the staple line. I irrigated the upper abdomen with saline. There was no bubbling or evidence of air leak. The mucosa looked viable. Dr. Daphine Deutscher decompressed the stomach with the endoscopy.   I converted to right subcostal trocar to a 15 trocar and extracted the stomach remnant through this intact and sent this to Pathology. I then placed 10 cc of Tisseel along the new greater curvature staple line and covered the entire staple line with the Tisseel.  I aspirated out the saline that I had irrigated because I thought the staple line looked viable and complete. There was no evidence of leak. I did not leave a drain in place.   Each port site seemed small, and did not require sutures. I closed the skin at each site with a 4-0 Monocryl, painted each wound with LiquiBand.   The patient was transported to recovery room in good condition. Sponge and needle count were correct at the end of the case.    Ovidio Kin, MD, [facility] Surgery Pager: [phone] Office phone:  [phone]

## 2014-12-07 NOTE — Interval H&P Note (Signed)
History and Physical Interval Note:  12/07/2014 11:37 AM  Charlene [name]  has presented today for surgery, with the diagnosis of Morbid Obesity  The various methods of treatment have been discussed with the patient and family.  Her husband is here with her.   After consideration of risks, benefits and other options for treatment, the patient has consented to  Procedure(s): LAPAROSCOPIC GASTRIC SLEEVE RESECTION (N/A) as a surgical intervention .  The patient's history has been reviewed, patient examined, no change in status, stable for surgery.  I have reviewed the patient's chart and labs.  Questions were answered to the patient's satisfaction.     [name]

## 2014-12-08 ENCOUNTER — Inpatient Hospital Stay (HOSPITAL_COMMUNITY): Payer: BLUE CROSS/BLUE SHIELD

## 2014-12-08 ENCOUNTER — Encounter (HOSPITAL_COMMUNITY): Payer: BLUE CROSS/BLUE SHIELD

## 2014-12-08 ENCOUNTER — Encounter (HOSPITAL_COMMUNITY): Payer: Self-pay | Admitting: Surgery

## 2014-12-08 LAB — HEMOGLOBIN AND HEMATOCRIT, BLOOD
HCT: 36.1 % (ref 36.0–46.0)
Hemoglobin: 12 g/dL (ref 12.0–15.0)

## 2014-12-08 LAB — CBC WITH DIFFERENTIAL/PLATELET
BASOS PCT: 0 %
Basophils Absolute: 0 10*3/uL (ref 0.0–0.1)
Eosinophils Absolute: 0 10*3/uL (ref 0.0–0.7)
Eosinophils Relative: 0 %
HEMATOCRIT: 34.4 % — AB (ref 36.0–46.0)
HEMOGLOBIN: 11.4 g/dL — AB (ref 12.0–15.0)
LYMPHS ABS: 0.4 10*3/uL — AB (ref 0.7–4.0)
Lymphocytes Relative: 5 %
MCH: 28.4 pg (ref 26.0–34.0)
MCHC: 33.1 g/dL (ref 30.0–36.0)
MCV: 85.6 fL (ref 78.0–100.0)
MONO ABS: 0.6 10*3/uL (ref 0.1–1.0)
MONOS PCT: 9 %
NEUTROS ABS: 6.2 10*3/uL (ref 1.7–7.7)
NEUTROS PCT: 86 %
Platelets: 218 10*3/uL (ref 150–400)
RBC: 4.02 MIL/uL (ref 3.87–5.11)
RDW: 12.6 % (ref 11.5–15.5)
WBC: 7.1 10*3/uL (ref 4.0–10.5)

## 2014-12-08 MED ORDER — IOHEXOL 300 MG/ML  SOLN
50.0000 mL | Freq: Once | INTRAMUSCULAR | Status: DC | PRN
  Administered 2014-12-08: 50 mL via ORAL
  Filled 2014-12-08: qty 50

## 2014-12-08 NOTE — Progress Notes (Signed)
General Surgery Note  LOS: 1 day  POD -  1 Day Post-Op  Assessment/Plan: 1.  LAPAROSCOPIC GASTRIC SLEEVE RESECTION - 12/07/2014 - D. Malacai Grantz  For MORBID OBESITY (weight 256, BMI of 41.99).  For swallow today.  2. MULTIPLE SCLEROSIS (G35)  She sees Dr. Harlen Labs - [facility] neurology It sounds like her disease and symptoms have been limited so far. She is on Tecfidera (T cell modulator)  3. Hyperlipidemia 4. Psoriasis 5. GERD - takes Zantac a couple of times a week  This is usually precipitated by eating the wrong foods. 6. Anxiety and depression 7. Quit smoking 2011 8. Migraines, attributed to the MS 9. Elevated cholesterol  10.  DVT prophylaxis - SQ Heparin  Active Problems:   Morbid obesity (HCC)   Subjective:  Doing okay except for headaches.  To get swallow today. Objective:   Filed Vitals:   12/08/14 0137 12/08/14 0525  BP: 134/82 116/70  Pulse: 85 103  Temp: 98.8 F (37.1 C) 98.8 F (37.1 C)  Resp: 16 16     Intake/Output from previous day:  11/22 0701 - 11/23 0700 In: 4518.8 [I.V.:4518.8] Out: -   Intake/Output this shift:      Physical Exam:   General: Obese WF who is alert and oriented.    HEENT: Normal. Pupils equal. .   Lungs: Clear   Abdomen: Soft   Wound: Clean     Lab Results:    Recent Labs  12/07/14 1625 12/08/14 0510  WBC  --  7.1  HGB 11.7* 11.4*  HCT 34.5* 34.4*  PLT  --  218    BMET  No results for input(s): NA, K, CL, CO2, GLUCOSE, BUN, CREATININE, CALCIUM in the last 72 hours.  PT/INR  No results for input(s): LABPROT, INR in the last 72 hours.  ABG  No results for input(s): PHART, HCO3 in the last 72 hours.  Invalid input(s): PCO2, PO2   Studies/Results:  No results found.   Anti-infectives:   Anti-infectives    Start     Dose/Rate Route Frequency Ordered Stop   12/07/14 0934  cefoTEtan in Dextrose 5% (CEFOTAN) IVPB 2 g     2 g Intravenous On call to O.R.  12/07/14 0934 12/07/14 1250      Ovidio Kin, MD, FACS Pager: [phone] [facility] [place] [facility] Office: [phone] 12/08/2014

## 2014-12-08 NOTE — Progress Notes (Signed)
Patient alert and oriented, pain is controlled. Patient is tolerating fluids, plan to advance to protein shake tomorrow.  Reviewed Gastric sleeve discharge instructions with patient and patient is able to articulate understanding.  Provided information on BELT program, Support Group and [facility] outpatient pharmacy. All questions answered, will continue to monitor.  

## 2014-12-08 NOTE — Care Management Note (Signed)
Case Management Note  Patient Details  Name: Charlene [name] MRN: [id number] Date of Birth: 04-Apr-1980  Subjective/Objective:        S/p Laparoscopic Sleeve gastrectomy (intraoperative upper endoscopy            Action/Plan: Discharge planning, no new needs identified  Expected Discharge Date:  12/09/14               Expected Discharge Plan:  Home/Self Care  In-House Referral:  NA  Discharge planning Services  CM Consult  Post Acute Care Choice:  NA Choice offered to:  NA  DME Arranged:  N/A DME Agency:  NA  HH Arranged:  NA HH Agency:  NA  Status of Service:  Completed, signed off  Medicare Important Message Given:    Date Medicare IM Given:    Medicare IM give by:    Date Additional Medicare IM Given:    Additional Medicare Important Message give by:     If discussed at Long Length of Stay Meetings, dates discussed:    Additional Comments:  Alexis Goodell, RN 12/08/2014, 10:44 AM

## 2014-12-08 NOTE — Discharge Instructions (Addendum)
° ° ° °GASTRIC BYPASS/SLEEVE ° Home Care Instructions ° ° These instructions are to help you care for yourself when you go home. ° °Call: If you have any problems. °• Call [phone] and ask for the surgeon on call °• If you need immediate assistance come to the ER at [place]. Tell the ER staff you are a new post-op gastric bypass or gastric sleeve patient  °Signs and symptoms to report: • Severe  vomiting or nausea °o If you cannot handle clear liquids for longer than 1 day, call your surgeon °• Abdominal pain which does not get better after taking your pain medication °• Fever greater than 100.4°  F and chills °• Heart rate over 100 beats a minute °• Trouble breathing °• Chest pain °• Redness,  swelling, drainage, or foul odor at incision (surgical) sites °• If your incisions open or pull apart °• Swelling or pain in calf (lower leg) °• Diarrhea (Loose bowel movements that happen often), frequent watery, uncontrolled bowel movements °• Constipation, (no bowel movements for 3 days) if this happens: °o Take Milk of Magnesia, 2 tablespoons by mouth, 3 times a day for 2 days if needed °o Stop taking Milk of Magnesia once you have had a bowel movement °o Call your doctor if constipation continues °Or °o Take Miralax  (instead of Milk of Magnesia) following the label instructions °o Stop taking Miralax once you have had a bowel movement °o Call your doctor if constipation continues °• Anything you think is “abnormal for you” °  °Normal side effects after surgery: • Unable to sleep at night or unable to concentrate °• Irritability °• Being tearful (crying) or depressed ° °These are common complaints, possibly related to your anesthesia, stress of surgery, and change in lifestyle, that usually go away a few weeks after surgery. If these feelings continue, call your medical doctor.  °Wound Care: You may have surgical glue, steri-strips, or staples over your incisions after surgery °• Surgical glue: Looks like clear  film over your incisions and will wear off a little at a time °• Steri-strips: Adhesive strips of tape over your incisions. You may notice a yellowish color on skin under the steri-strips. This is used to make the steri-strips stick better. Do not pull the steri-strips off - let them fall off °• Staples: Staples may be removed before you leave the hospital °o If you go home with staples, call [facility] for an appointment with your surgeon’s nurse to have staples removed 10 days after surgery, [phone] °• Showering: You may shower two (2) days after your surgery unless your surgeon tells you differently °o Wash gently around incisions with warm soapy water, rinse well, and gently pat dry °o If you have a drain (tube from your incision), you may need someone to hold this while you shower °o No tub baths until staples are removed and incisions are healed °  °Medications: • Medications should be liquid or crushed if larger than the size of a dime °• Extended release pills (medication that releases a little bit at a time through the  day) should not be crushed °• Depending on the size and number of medications you take, you may need to space (take a few throughout the day)/change the time you take your medications so that you do not over-fill your pouch (smaller stomach) °• Make sure you follow-up with you primary care physician to make medication changes needed during rapid weight loss and life -style changes °•   If you have diabetes, follow up with your doctor that orders your diabetes medication(s) within one week after surgery and check your blood sugar regularly ° °• Do not drive while taking narcotics (pain medications) ° °• Do not take acetaminophen (Tylenol) and Roxicet or Lortab Elixir at the same time since these pain medications contain acetaminophen °  °Diet:  °First 2 Weeks You will see the nutritionist about two (2) weeks after your surgery. The nutritionist will increase the types of  foods you can eat if you are handling liquids well: °• If you have severe vomiting or nausea and cannot handle clear liquids lasting longer than 1 day call your surgeon °Protein Shake °• Drink at least 2 ounces of shake 5-6 times per day °• Each serving of protein shakes (usually 8-12 ounces) should have a minimum of: °o 15 grams of protein °o And no more than 5 grams of carbohydrate °• Goal for protein each day: °o Men = 80 grams per day °o Women = 60 grams per day °  ° • Protein powder may be added to fluids such as non-fat milk or Lactaid milk or Soy milk (limit to 35 grams added protein powder per serving) ° °Hydration °• Slowly increase the amount of water and other clear liquids as tolerated (See Acceptable Fluids) °• Slowly increase the amount of protein shake as tolerated °• Sip fluids slowly and throughout the day °• May use sugar substitutes in small amounts (no more than 6-8 packets per day; i.e. Splenda) ° °Fluid Goal °• The first goal is to drink at least 8 ounces of protein shake/drink per day (or as directed by the nutritionist); some examples of protein shakes are Syntrax Nectar, Adkins Advantage, EAS Edge HP, and Unjury. - See handout from pre-op Bariatric Education Class: °o Slowly increase the amount of protein shake you drink as tolerated °o You may find it easier to slowly sip shakes throughout the day °o It is important to get your proteins in first °• Your fluid goal is to drink 64-100 ounces of fluid daily °o It may take a few weeks to build up to this  °• 32 oz. (or more) should be clear liquids °And °• 32 oz. (or more) should be full liquids (see below for examples) °• Liquids should not contain sugar, caffeine, or carbonation ° °Clear Liquids: °• Water of Sugar-free flavored water (i.e. Fruit H²O, Propel) °• Decaffeinated coffee or tea (sugar-free) °• Crystal lite, Wyler’s Lite, Minute Maid Lite °• Sugar-free Jell-O °• Bouillon or broth °• Sugar-free Popsicle:    - Less than 20 calories  each; Limit 1 per day ° °Full Liquids: °                  Protein Shakes/Drinks + 2 choices per day of other full liquids °• Full liquids must be: °o No More Than 12 grams of Carbs per serving °o No More Than 3 grams of Fat per serving °• Strained low-fat cream soup °• Non-Fat milk °• Fat-free Lactaid Milk °• Sugar-free yogurt (Dannon Lite & Fit, Greek yogurt) ° °  °Vitamins and Minerals • Start 1 day after surgery unless otherwise directed by your surgeon °• 2 Chewable Multivitamin / Multimineral Supplement with iron (i.e. Centrum for Adults) °• Vitamin B-12, 350-500 micrograms sub-lingual (place tablet under the tongue) each day °• Chewable Calcium Citrate with Vitamin D-3 °(Example: 3 Chewable Calcium  Plus 600 with Vitamin D-3) °o Take 500 mg three (3) times a day for   a total of 1500 mg each day °o Do not take all 3 doses of calcium at one time as it may cause constipation, and you can only absorb 500 mg at a time °o Do not mix multivitamins containing iron with calcium supplements;  take 2 hours apart °o Do not substitute Tums (calcium carbonate) for your calcium °• Menstruating women and those at risk for anemia ( a blood disease that causes weakness) may need extra iron °o Talk to your doctor to see if you need more iron °• If you need extra iron: Total daily Iron recommendation (including Vitamins) is 50 to 100 mg Iron/day °• Do not stop taking or change any vitamins or minerals until you talk to your nutritionist or surgeon °• Your nutritionist and/or surgeon must approve all vitamin and mineral supplements °  °Activity and Exercise: It is important to continue walking at home. Limit your physical activity as instructed by your doctor. During this time, use these guidelines: °• Do not lift anything greater than ten  (10) pounds for at least two (2) weeks °• Do not go back to work or drive until your surgeon says you can °• You may have sex when you feel comfortable °o It is VERY important for female  patients to use a reliable birth control method; fertility often increase after surgery °o Do not get pregnant for at least 18 months °• Start exercising as soon as your doctor tells you that you can °o Make sure your doctor approves any physical activity °• Start with a simple walking program °• Walk 5-15 minutes each day, 7 days per week °• Slowly increase until you are walking 30-45 minutes per day °• Consider joining our BELT program. [phone] or email [email or web address] °  °Special Instructions Things to remember: °• Free counseling is available for you and your family through collaboration between [facility] and [employer]. Please call [phone] and leave a message °• Use your CPAP when sleeping if this applies to you °• Consider buying a medical alert bracelet that says you had lap-band surgery °  °  You will likely have your first fill (fluid added to your band) 6 - 8 weeks after surgery °• [facility] has a free Bariatric Surgery Support Group that meets monthly, the 3rd Thursday, 6pm. [facility] Classrooms. You can see classes online at [email or web address] °• It is very important to keep all follow up appointments with your surgeon, nutritionist, primary care physician, and behavioral health practitioner °o After the first year, please follow up with your bariatric surgeon and nutritionist at least once a year in order to maintain best weight loss results °      °             [facility]:  [phone] ° °             [facility]: [phone] ° °             Bariatric Nurse Coordinator: [phone]  °Gastric Bypass/Sleeve Home Care Instructions  Rev. 02/2012    ° °                                                    Reviewed and Endorsed °                                                     by [facility] Patient Education Committee, Jan, 2016    CENTRAL  SURGERY - DISCHARGE INSTRUCTIONS TO  PATIENT  Activity:  Driving - May drive in 3 or 4 days   Lifting - No lifting more than 15 pounds for one week, then no limit  Wound Care:   May shower  Diet:  Post op sleeve diet  Follow up appointment:  Call Dr. Allene Pyo office [facility]) at [phone] for an appointment in 2 weeks.  Medications and dosages:  Resume your home medications.  You have a prescription for:  Oxycodone and Zofran  Call Dr. Ezzard Standing or his office  [phone]) if you have:  Temperature greater than 100.4,  Persistent nausea and vomiting,  Severe uncontrolled pain,  Redness, tenderness, or signs of infection (pain, swelling, redness, odor or green/yellow discharge around the site),  Difficulty breathing, headache or visual disturbances,  Any other questions or concerns you may have after discharge.  In an emergency, call 911 or go to an Emergency Department at a nearby hospital.

## 2014-12-09 ENCOUNTER — Encounter (HOSPITAL_COMMUNITY): Payer: BLUE CROSS/BLUE SHIELD

## 2014-12-09 LAB — CBC WITH DIFFERENTIAL/PLATELET
BASOS ABS: 0 10*3/uL (ref 0.0–0.1)
Basophils Relative: 0 %
Eosinophils Absolute: 0.1 10*3/uL (ref 0.0–0.7)
Eosinophils Relative: 1 %
HEMATOCRIT: 32.2 % — AB (ref 36.0–46.0)
Hemoglobin: 10.6 g/dL — ABNORMAL LOW (ref 12.0–15.0)
LYMPHS PCT: 8 %
Lymphs Abs: 0.4 10*3/uL — ABNORMAL LOW (ref 0.7–4.0)
MCH: 28.6 pg (ref 26.0–34.0)
MCHC: 32.9 g/dL (ref 30.0–36.0)
MCV: 87 fL (ref 78.0–100.0)
MONO ABS: 0.5 10*3/uL (ref 0.1–1.0)
MONOS PCT: 11 %
NEUTROS ABS: 4.1 10*3/uL (ref 1.7–7.7)
Neutrophils Relative %: 80 %
Platelets: 181 10*3/uL (ref 150–400)
RBC: 3.7 MIL/uL — ABNORMAL LOW (ref 3.87–5.11)
RDW: 12.7 % (ref 11.5–15.5)
WBC: 5.1 10*3/uL (ref 4.0–10.5)

## 2014-12-09 NOTE — Discharge Summary (Signed)
Physician Discharge Summary  Patient ID:  Charlene [name]  MRN: [id number]  DOB/AGE: Sep 06, 1980 34 y.o.  Admit date: 12/07/2014 Discharge date: 12/09/2014  Discharge Diagnoses:  1.  MORBID OBESITY (weight 256, BMI of 41.99).  2. MULTIPLE SCLEROSIS (G35) She sees Dr. Harlen Labs - [facility] neurology It sounds like her disease and symptoms have been limited so far. She is on Tecfidera (T cell modulator)  3. Hyperlipidemia 4. Psoriasis 5. GERD - takes Zantac a couple of times a week This is usually precipitated by eating the wrong foods. 6. Anxiety and depression 7. Quit smoking 2011 8. Migraines, attributed to the MS 9. Elevated cholesterol  Active Problems:   Morbid obesity (HCC)   Operation: Procedure(s):  LAPAROSCOPIC GASTRIC SLEEVE RESECTION, upper endoscopy on 12/07/2014 - D. Ezzard Standing  Discharged Condition: good  Hospital Course: Charlene [name] is an 34 y.o. female whose primary care physician is Terressa Koyanagi., DO and who was admitted 12/07/2014 with a chief complaint of No chief complaint on file. Marland Kitchen   She was brought to the operating room on 12/07/2014 and underwent  LAPAROSCOPIC GASTRIC SLEEVE RESECTION.   Her swallow the first post op day was okay. She has had a headache. She is taking po's well and ready to go home.  The discharge instructions were reviewed with the patient.  Consults: None  Significant Diagnostic Studies: Results for orders placed or performed during the hospital encounter of 12/07/14  Pregnancy, urine STAT morning of surgery  Result Value Ref Range   Preg Test, Ur NEGATIVE NEGATIVE  Hemoglobin and hematocrit, blood  Result Value Ref Range   Hemoglobin 11.7 (L) 12.0 - 15.0 g/dL   HCT 38.4 (L) 53.6 - 46.8 %  CBC WITH DIFFERENTIAL  Result Value Ref Range   WBC 7.1 4.0 - 10.5 K/uL   RBC 4.02 3.87 - 5.11 MIL/uL   Hemoglobin 11.4 (L) 12.0 - 15.0 g/dL   HCT 03.2 (L) 12.2  - 46.0 %   MCV 85.6 78.0 - 100.0 fL   MCH 28.4 26.0 - 34.0 pg   MCHC 33.1 30.0 - 36.0 g/dL   RDW 48.2 50.0 - 37.0 %   Platelets 218 150 - 400 K/uL   Neutrophils Relative % 86 %   Neutro Abs 6.2 1.7 - 7.7 K/uL   Lymphocytes Relative 5 %   Lymphs Abs 0.4 (L) 0.7 - 4.0 K/uL   Monocytes Relative 9 %   Monocytes Absolute 0.6 0.1 - 1.0 K/uL   Eosinophils Relative 0 %   Eosinophils Absolute 0.0 0.0 - 0.7 K/uL   Basophils Relative 0 %   Basophils Absolute 0.0 0.0 - 0.1 K/uL  Hemoglobin and hematocrit, blood  Result Value Ref Range   Hemoglobin 12.0 12.0 - 15.0 g/dL   HCT 48.8 89.1 - 69.4 %  CBC with Differential  Result Value Ref Range   WBC 5.1 4.0 - 10.5 K/uL   RBC 3.70 (L) 3.87 - 5.11 MIL/uL   Hemoglobin 10.6 (L) 12.0 - 15.0 g/dL   HCT 50.3 (L) 88.8 - 28.0 %   MCV 87.0 78.0 - 100.0 fL   MCH 28.6 26.0 - 34.0 pg   MCHC 32.9 30.0 - 36.0 g/dL   RDW 03.4 91.7 - 91.5 %   Platelets 181 150 - 400 K/uL   Neutrophils Relative % 80 %   Neutro Abs 4.1 1.7 - 7.7 K/uL   Lymphocytes Relative 8 %   Lymphs Abs 0.4 (L) 0.7 - 4.0 K/uL  Monocytes Relative 11 %   Monocytes Absolute 0.5 0.1 - 1.0 K/uL   Eosinophils Relative 1 %   Eosinophils Absolute 0.1 0.0 - 0.7 K/uL   Basophils Relative 0 %   Basophils Absolute 0.0 0.0 - 0.1 K/uL    Dg Ugi W/water Sol Cm  12/08/2014  CLINICAL DATA:  Initial encounter for status post gastric sleeve procedure. EXAM: WATER SOLUBLE UPPER GI SERIES TECHNIQUE: Single-column upper GI series was performed using water soluble contrast. CONTRAST:  50mL OMNIPAQUE IOHEXOL 300 MG/ML  SOLN COMPARISON:  11/02/2014. FLUOROSCOPY TIME:  Radiation Exposure Index (as provided by the fluoroscopic device): If the device does not provide the exposure index: Fluoroscopy Time (in minutes and seconds):  0 minutes and 49 seconds Number of Acquired Images: FINDINGS: Patient was given small volume water-soluble contrast by mouth. Contrast material flows freely into the proximal stomach and  then passes through the gastric sleeve to the antral pyloric region. No evidence for contrast extravasation along the gastric suture line. The contrast then is visualized flowing through the pylorus into a nondilated duodenum. No evidence for duodenal fold thickening. Duodenal wall and proximal jejunal peristalsis is slightly sluggish. IMPRESSION: No evidence for gastric obstruction. No contrast extravasation from the lumen of the stomach to suggest leak. Electronically Signed   By: Kennith Center M.D.   On: 12/08/2014 09:54    Discharge Exam:  Filed Vitals:   12/08/14 2142 12/09/14 0159  BP: 124/62 100/50  Pulse: 92 71  Temp: 100 F (37.8 C) 99 F (37.2 C)  Resp: 18 18    General: WN obese WF who is alert and generally healthy appearing.  Lungs: Clear to auscultation and symmetric breath sounds. Heart:  RRR. No murmur or rub. Abdomen: Soft. No mass. No hernia. Normal bowel sounds.  Incisions look good.  Discharge Medications:     Medication List    TAKE these medications        acetaminophen 500 MG tablet  Commonly known as:  TYLENOL  Take 1,000-1,500 mg by mouth daily as needed for moderate pain or headache.     EXCEDRIN MIGRAINE PO  Take 1 tablet by mouth daily as needed (for headache).  Notes to Patient:  Avoid NSAIDs for 6-8 weeks after surgery      FLUoxetine 20 MG capsule  Commonly known as:  PROZAC  Take 20 mg by mouth at bedtime.     SUMAtriptan 5 MG/ACT nasal spray  Commonly known as:  IMITREX  Place 1 spray into the nose as needed for migraine.     TECFIDERA 240 MG Cpdr  Generic drug:  Dimethyl Fumarate  Take 240 mg by mouth 2 (two) times daily.     VITAMIN D PO  Take 4,000 Units by mouth daily.     ZANTAC PO  Take by mouth.     ranitidine 150 MG tablet  Commonly known as:  ZANTAC  Take 150 mg by mouth daily as needed for heartburn.        Disposition: 01-Home or Self Care      Discharge Instructions    Ambulate hourly while awake    Complete  by:  As directed      Call MD for:  difficulty breathing, headache or visual disturbances    Complete by:  As directed      Call MD for:  persistant dizziness or light-headedness    Complete by:  As directed      Call MD for:  persistant nausea and  vomiting    Complete by:  As directed      Call MD for:  redness, tenderness, or signs of infection (pain, swelling, redness, odor or green/yellow discharge around incision site)    Complete by:  As directed      Call MD for:  severe uncontrolled pain    Complete by:  As directed      Call MD for:  temperature >101 F    Complete by:  As directed      Diet bariatric full liquid    Complete by:  As directed      Incentive spirometry    Complete by:  As directed   Perform hourly while awake           Follow-up Information    Follow up with [facility] H, MD. Go on 12/17/2014.   Specialty:  General Surgery   Why:  For Post-Op Check at 4:00   Contact information:   [address] [place] [address] [phone]       Follow up with [facility] H, MD. Go on 01/19/2015.   Specialty:  General Surgery   Why:  For Post-Op Check at 11:15 AM   Contact information:   [address] [place] [address] [phone]      Activity:  Driving - May drive in 3 or 4 days   Lifting - No lifting more than 15 pounds for one week, then no limit  Wound Care:   May shower  Diet:  Post op sleeve diet  Follow up appointment:  Call Dr. Allene Pyo office [facility]) at [phone] for an appointment in 2 weeks.  Medications and dosages:  Resume your home medications.  You have a prescription for:  Oxycodone and Zofran  Signed: Ovidio Kin, M.D., [facility] Office:  [phone]  12/09/2014, 8:44 AM

## 2014-12-12 NOTE — Anesthesia Postprocedure Evaluation (Signed)
Anesthesia Post Note  Patient: Charlene [name]  Procedure(s) Performed: Procedure(s) (LRB): LAPAROSCOPIC GASTRIC SLEEVE RESECTION (N/A)  Patient location during evaluation: PACU Anesthesia Type: General Level of consciousness: awake and alert Pain management: pain level controlled Vital Signs Assessment: post-procedure vital signs reviewed and stable Respiratory status: spontaneous breathing, nonlabored ventilation, respiratory function stable and patient connected to nasal cannula oxygen Cardiovascular status: blood pressure returned to baseline and stable Postop Assessment: No signs of nausea or vomiting Anesthetic complications: no    Last Vitals:  Filed Vitals:   12/09/14 0159 12/09/14 0958  BP: 100/50 115/64  Pulse: 71 76  Temp: 37.2 C 36.8 C  Resp: 18 18    Last Pain:  Filed Vitals:   12/09/14 1055  PainSc: 2                  Cecile Hearing

## 2014-12-13 ENCOUNTER — Telehealth (HOSPITAL_COMMUNITY): Payer: Self-pay

## 2014-12-13 NOTE — Telephone Encounter (Signed)
Made discharge phone call to patient per DROP protocol. Asking the following questions.    1. Do you have someone to care for you now that you are home?  yes 2. Are you having pain now that is not relieved by your pain medication?  no 3. Are you able to drink the recommended daily amount of fluids (48 ounces minimum/day) and protein (60-80 grams/day) as prescribed by the dietitian or nutritional counselor?  Working on it.Marland Kitcheni get so full on protein shakes that I am struggling to get in my clear fluids 4. Are you taking the vitamins and minerals as prescribed?  yes 5. Do you have the "on call" number to contact your surgeon if you have a problem or question?  yes 6. Are your incisions free of redness, swelling or drainage? (If steri strips, address that these can fall off, shower as tolerated) yes 7. Have your bowels moved since your surgery?  If not, are you passing gas?  yes 8. Are you up and walking 3-4 times per day?  yes

## 2014-12-21 ENCOUNTER — Encounter: Payer: BLUE CROSS/BLUE SHIELD | Attending: Surgery

## 2014-12-21 DIAGNOSIS — Z6841 Body Mass Index (BMI) 40.0 and over, adult: Secondary | ICD-10-CM | POA: Diagnosis not present

## 2014-12-21 DIAGNOSIS — Z713 Dietary counseling and surveillance: Secondary | ICD-10-CM | POA: Diagnosis not present

## 2014-12-21 DIAGNOSIS — E669 Obesity, unspecified: Secondary | ICD-10-CM | POA: Insufficient documentation

## 2014-12-22 NOTE — Progress Notes (Signed)
Bariatric Class:  Appt start time: 1530 end time:  1630.  2 Week Post-Operative Nutrition Class  Patient was seen on 12/21/14 for Post[facility]ative [facility]   Surgery date: 12/06/14 Surgery type: RYGB Start weight at [facility]: 261 lbs on 11/16/2014 Weight today: 238.5 lbs  Weight change: 23.5 lbs  TANITA  BODY COMP RESULTS  11/22/14 12/21/14   BMI (kg/m^2) 42.9 39.1   Fat Mass (lbs) 132.5 113   Fat Free Mass (lbs) 129.5 125.5   Total Body Water (lbs) 95 92.    The following the learning objectives were met by the patient during this course:  Identifies Phase 3A (Soft, High Proteins) Dietary Goals and will begin from 2 weeks post-operatively to 2 months post-operatively  Identifies appropriate sources of fluids and proteins   States protein recommendations and appropriate sources post-operatively  Identifies the need for appropriate texture modifications, mastication, and bite sizes when consuming solids  Identifies appropriate multivitamin and calcium sources post-operatively  Describes the need for physical activity post-operatively and will follow MD recommendations  States when to call healthcare provider regarding medication questions or post-operative complications  Handouts given during class include:  Phase 3A: Soft, High Protein Diet Handout  Follow-Up Plan: Patient will follow-up at [facility] in 6 weeks for 2 month post-op nutrition visit for diet advancement per MD.

## 2015-02-08 ENCOUNTER — Ambulatory Visit: Payer: BLUE CROSS/BLUE SHIELD | Admitting: Dietician

## 2015-02-28 ENCOUNTER — Other Ambulatory Visit: Payer: Self-pay | Admitting: Nurse Practitioner

## 2015-02-28 DIAGNOSIS — G35 Multiple sclerosis: Secondary | ICD-10-CM

## 2015-03-14 ENCOUNTER — Encounter: Payer: Self-pay | Admitting: *Deleted

## 2015-03-18 ENCOUNTER — Ambulatory Visit
Admission: RE | Admit: 2015-03-18 | Discharge: 2015-03-18 | Disposition: A | Discharge status: Home / Self Care | Payer: BLUE CROSS/BLUE SHIELD | Source: Ambulatory Visit | Attending: Nurse Practitioner | Admitting: Nurse Practitioner

## 2015-03-18 DIAGNOSIS — G35 Multiple sclerosis: Secondary | ICD-10-CM

## 2015-03-18 MED ORDER — GADOBENATE DIMEGLUMINE 529 MG/ML IV SOLN
20.0000 mL | Freq: Once | INTRAVENOUS | Status: AC | PRN
  Administered 2015-03-18: 20 mL via INTRAVENOUS

## 2015-04-12 ENCOUNTER — Encounter: Payer: Self-pay | Admitting: Dietician

## 2015-04-12 ENCOUNTER — Encounter: Payer: BLUE CROSS/BLUE SHIELD | Attending: Surgery | Admitting: Dietician

## 2015-04-12 DIAGNOSIS — Z029 Encounter for administrative examinations, unspecified: Secondary | ICD-10-CM | POA: Insufficient documentation

## 2015-04-12 NOTE — Progress Notes (Signed)
  Follow-up visit:  4 months Post-Operative sleeve gastrectomy Surgery  Medical Nutrition Therapy:  Appt start time: 1055 end time:  1130.  Primary concerns today: Post-operative Bariatric Surgery Nutrition Management.  Charlene [name] returns for her first nutrition follow up since post op class in December. She has lost a total of 55.5 lbs. She has 35-year old twins and a 48-year old and reports she is very busy and has trouble meal planning. Tolerates all foods. Craving salt since surgery. Was tracking on [employer] app but has not been tracking lately. [name] reports that she feels like she is eating too much protein sometimes and feels like she can eat a lot at one time.  Surgery date: 12/06/14 Surgery type: sleeve gastrectomy Start weight at [facility]: 261 lbs on 11/16/2014 Weight today: 205.5 lbs Weight change: 23.5 lbs Total weight loss: 55.5 lbs  TANITA  BODY COMP RESULTS  11/22/14 12/21/14 04/12/15   BMI (kg/m^2) 42.9 39.1 33.2   Fat Mass (lbs) 132.5 113 90.5   Fat Free Mass (lbs) 129.5 125.5 115   Total Body Water (lbs) 95 92. 84    Preferred Learning Style:   No preference indicated   Learning Readiness:   Ready  24-hr recall: B (AM): 2 hard boiled eggs and protein shake (42g) Snk (AM): almonds and jerky (12g) L (PM): crustless quiche (14g) Snk (PM): almonds or jerky (12g) D (PM): pulled pork OR chicken and spaghetti squash (21g) Snk (PM): jerky and almonds or Balanced Break (12g)  Fluid intake: did not assess Estimated total protein intake: 113g/day  Medications: see list Supplementation: taking, has trouble with Calcium  Drinking while eating: sometimes with salty snacks Hair loss: yes, taking Biotin Carbonated beverages: no N/V/D/C: constipation (takes stool softeners) Dumping syndrome: none  Recent physical activity:  Gym 3-5 days a week (jogging on treadmill)  Progress Towards Goal(s):  In progress.  Handouts given during visit include:  none   Nutritional  Diagnosis:  Charlene [name]-3.3 Overweight/obesity related to past poor dietary habits and physical inactivity as evidenced by patient w/ recent sleeve gastrectomy surgery following dietary guidelines for continued weight loss.     Intervention:  Nutrition counseling provided. Goals: -Work on mindful eating  -Eat sitting at the table when possible -Avoid weighing yourself every day (have your husband hide the scale from you a few days a week) -Look into starting strength training -Pre portion leftovers and freeze them  -Focus on the victories! (Keep these good habits!) -Fill up on veggies -Try Skinny Girl dressing for salad  Teaching Method Utilized:  Visual Auditory Hands on  Barriers to learning/adherence to lifestyle change: family dynamics  Demonstrated degree of understanding via:  Teach Back   Monitoring/Evaluation:  Dietary intake, exercise, and body weight. Follow up in 3 months for 7 month post-op visit.

## 2015-04-12 NOTE — Patient Instructions (Addendum)
-  Work on mindful eating  -Eat sitting at the table when possible -Avoid weighing yourself every day (have your husband hide the scale from you a few days a week) -Look into starting strength training -Pre portion leftovers and freeze them  -Focus on the victories! (Keep these good habits!) -Fill up on veggies -Try Skinny Girl dressing for salad  Surgery date: 12/06/14 Surgery type: RYGB Start weight at [facility]: 261 lbs on 11/16/2014 Weight today: 205.5 lbs Weight change: 23.5 lbs Total weight loss: 55.5 lbs  TANITA  BODY COMP RESULTS  11/22/14 12/21/14 04/12/15   BMI (kg/m^2) 42.9 39.1 33.2   Fat Mass (lbs) 132.5 113 90.5   Fat Free Mass (lbs) 129.5 125.5 115   Total Body Water (lbs) 95 92. 84

## 2015-05-09 ENCOUNTER — Ambulatory Visit (INDEPENDENT_AMBULATORY_CARE_PROVIDER_SITE_OTHER): Payer: BLUE CROSS/BLUE SHIELD | Admitting: Family Medicine

## 2015-05-09 ENCOUNTER — Encounter: Payer: Self-pay | Admitting: Family Medicine

## 2015-05-09 VITALS — BP 90/62 | HR 75 | Temp 98.2°F | Ht 66.0 in | Wt 211.0 lb

## 2015-05-09 DIAGNOSIS — R7309 Other abnormal glucose: Secondary | ICD-10-CM

## 2015-05-09 DIAGNOSIS — H6592 Unspecified nonsuppurative otitis media, left ear: Secondary | ICD-10-CM

## 2015-05-09 DIAGNOSIS — R739 Hyperglycemia, unspecified: Secondary | ICD-10-CM

## 2015-05-09 DIAGNOSIS — E785 Hyperlipidemia, unspecified: Secondary | ICD-10-CM | POA: Diagnosis not present

## 2015-05-09 DIAGNOSIS — J329 Chronic sinusitis, unspecified: Secondary | ICD-10-CM

## 2015-05-09 DIAGNOSIS — H6982 Other specified disorders of Eustachian tube, left ear: Secondary | ICD-10-CM | POA: Diagnosis not present

## 2015-05-09 DIAGNOSIS — J310 Chronic rhinitis: Secondary | ICD-10-CM

## 2015-05-09 NOTE — Patient Instructions (Signed)
Before you leave: -Permission/obtain lab results from the last 1 year doctor [facility]  Start Afrin nasal spray twice daily for 5 days. Do not use longer then 5 days. This is available over-the-counter.  Start Flonase, 2 sprays each nostril daily for 21 days.  Please follow up in 2-3 weeks if symptoms are not resolved or significantly improved.  Follow-up sooner if symptoms worsen or new concerns.

## 2015-05-09 NOTE — Progress Notes (Signed)
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 Ezzard Standing, [facility] surgery  MS (multiple sclerosis)/Migraines: -sees Dr. Renne Crigler at [facility]  Psoriasis: -[facility]  ROS: See pertinent positives and negatives per HPI.  Past Medical History  Diagnosis Date  . MS (multiple sclerosis) (HCC)   . Hyperlipidemia   . GERD (gastroesophageal reflux disease)   . Psoriasis   . Infertility associated with anovulation   . Anxiety and depression   . Pregnancy induced hypertension   . Migraines   . 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 Personal history of pre-term labor   . Obesity in pregnancy, antepartum   . Depression   . Elevated blood sugar 06/08/2014  . Claustrophobia   . Heart murmur     Past Surgical History  Procedure Laterality Date  . Dilation and curettage of uterus  8/10  . Dilation and evacuation  12/29/2011    Procedure: DILATATION AND EVACUATION;  Surgeon: Catalina Antigua, MD;  Location: [facility];  Service: Gynecology;;  Dr, Emelda Fear transferred case to Dr. Jolayne Panther  . Laparoscopic gastric sleeve resection N/A 12/07/2014    Procedure: LAPAROSCOPIC GASTRIC SLEEVE RESECTION;  Surgeon: Ovidio Kin, MD;  Location: [facility];  Service: General;  Laterality: N/A;    Family History  Problem Relation Age of Onset  . Heart attack Maternal Grandfather   . Cancer Maternal Grandmother     esophagus  . Alcoholism Father   . Alcoholism Maternal Grandmother   . Hyperlipidemia Mother   .  Mental illness Mother   . Mental illness Maternal Grandmother   . Dementia Mother     early onset    Social History   Social History  . Marital Status: Married    Spouse Name: N/A  . Number of Children: N/A  . Years of Education: N/A   Occupational History  . office manager    Social History Main Topics  . Smoking status: Former Smoker -- 1.00 packs/day for 15 years    Types: Cigarettes    Quit date: 07/02/2010  . Smokeless tobacco: Never Used  . Alcohol Use: No     Comment: socially   . Drug Use: No  . Sexual Activity:    Partners: Male   Other Topics Concern  . None   Social History Narrative   Work or School: homemaker      Home Situation: lives with husband, 2 twins (2.32 yo) and 62 month old in 2016      Spiritual Beliefs: none      Lifestyle: no regular exercise, poor diet           Current outpatient prescriptions:  .  acetaminophen (TYLENOL) 500 MG tablet, Take 1,000-1,500 mg by mouth daily as needed for moderate pain or headache., Disp: , Rfl:  .  Biotin 10 MG CAPS, Take by mouth., Disp: , Rfl:  .  BuPROPion HCl (WELLBUTRIN PO), Take by mouth., Disp: , Rfl:  .  CALCIUM PO, Take by mouth., Disp: , Rfl:  .  Dextromethorphan-Quinidine 20-10 MG CAPS,  Take by mouth., Disp: , Rfl:  .  docusate sodium (COLACE) 100 MG capsule, Take 100 mg by mouth., Disp: , Rfl:  .  ferrous sulfate 325 (65 FE) MG tablet, Take 325 mg by mouth., Disp: , Rfl:  .  NASCOBAL 500 MCG/0.1ML SOLN, , Disp: , Rfl: 6 .  SUMAtriptan (IMITREX) 5 MG/ACT nasal spray, Place 1 spray into the nose as needed for migraine., Disp: , Rfl:  .  TECFIDERA 240 MG CPDR, Take 240 mg by mouth 2 (two) times daily., Disp: , Rfl: 10  EXAM:  Filed Vitals:   05/09/15 1525  BP: 90/62  Pulse: 75  Temp: 98.2 F (36.8 C)    Body mass index is 34.07 kg/(m^2).  GENERAL: vitals reviewed and listed above, alert, oriented, appears well hydrated and in no acute distress  HEENT: atraumatic, conjunttiva clear,  allergic shiners, no obvious abnormalities on inspection of external nose and ears, normal appearance of ear canals and TMs - except for clear effusion L, clear nasal congestion, mild post oropharyngeal erythema with PND, no tonsillar edema or exudate, no sinus TTP  NECK: no obvious masses on inspection  LUNGS: clear to auscultation bilaterally, no wheezes, rales or rhonchi, good air movement  CV: HRRR, no peripheral edema  MS: moves all extremities without noticeable abnormality  PSYCH: pleasant and cooperative, no obvious depression or anxiety  ASSESSMENT AND PLAN:  Discussed the following assessment and plan:  Middle ear effusion, left  ETD (eustachian tube dysfunction), left  Rhinosinusitis  -we discussed possible serious and likely etiologies, workup and treatment, treatment risks and return precautions - likely ETD/ear effussion secondary to allergic rhinitis -after this discussion, Charlene [name] opted for nasal sprays per instructions -follow up advised if worsening or persists -advised she touch base with neurologist if feeling off balance persists -congratulated and encouraged on weight loss -advised assistant to request labs from her surgeon -of course, we advised Charlene [name]  to return or notify a doctor immediately if symptoms worsen or persist or new concerns arise.   Patient Instructions  Before you leave: -Permission/obtain lab results from the last 1 year doctor [facility]  Start Afrin nasal spray twice daily for 5 days. Do not use longer then 5 days. This is available over-the-counter.  Start Flonase, 2 sprays each nostril daily for 21 days.  Please follow up in 2-3 weeks if symptoms are not resolved or significantly improved.  Follow-up sooner if symptoms worsen or new concerns.     Kriste Basque R.

## 2015-05-09 NOTE — Progress Notes (Signed)
Pre visit review using our clinic review tool, if applicable. No additional management support is needed unless otherwise documented below in the visit note. 

## 2015-06-30 ENCOUNTER — Telehealth: Payer: Self-pay | Admitting: Family Medicine

## 2015-06-30 NOTE — Telephone Encounter (Signed)
Ok to switch if ok w/ Dr Selena Batten

## 2015-06-30 NOTE — Telephone Encounter (Signed)
Pt leaves in [place] and would like to see dr Beverely Low. Is that ok. Pt can not make her appt until dr kim ok the transfer

## 2015-06-30 NOTE — Telephone Encounter (Signed)
Ok with me 

## 2015-07-01 NOTE — Telephone Encounter (Signed)
Pt is aware.  

## 2015-07-14 ENCOUNTER — Encounter: Payer: 59 | Attending: Surgery | Admitting: Dietician

## 2015-07-14 DIAGNOSIS — Z029 Encounter for administrative examinations, unspecified: Secondary | ICD-10-CM | POA: Diagnosis present

## 2015-07-14 NOTE — Progress Notes (Signed)
  Follow-up visit:  8 months Post-Operative sleeve gastrectomy Surgery  Medical Nutrition Therapy:  Appt start time: 210 end time: 255   Primary concerns today: Post-operative Bariatric Surgery Nutrition Management.  Charlene [name] returns having gained 2 pounds. She is discouraged with lack of weight loss. She is very tearful today and frustrated with herself. Struggling to stay on track and feels like old habits are creeping back in. She reports that she feels like taking her diet "meal by meal is dangerous." [name] states that she is hungry all the time, especially at night and has a tendency to graze in the evenings. Concerned that she can eat large amounts without any vomiting.   Surgery date: 12/06/14 Surgery type: sleeve gastrectomy Start weight at [facility]: 261 lbs on 11/16/2014 Weight today: 207.2 lbs Weight change: 2 lbs gain Total weight loss: 53.5 lbs  TANITA  BODY COMP RESULTS  11/22/14 12/21/14 04/12/15 07/14/15   BMI (kg/m^2) 42.9 39.1 33.2 33.4   Fat Mass (lbs) 132.5 113 90.5 86.2   Fat Free Mass (lbs) 129.5 125.5 115 121   Total Body Water (lbs) 95 92. 84 87.6    Preferred Learning Style:   No preference indicated   Learning Readiness:   Ready  24-hr recall:  B (8-8:30 AM): iced coffee with sugar free creamer, boiled eggs or Atkins protein bar Snk (AM): Atkins snack bar L (PM): Chipotle bowl or salad with chicken and black beans   Snk (3-4 PM): Atkins bar D (PM): chicken stromboli or tacos Snk (PM): jerky and almonds or Balanced Break (12g)  Fluid intake: did not assess Estimated total protein intake: 113g/day  Medications: see list Supplementation: taking, has trouble with Calcium  Drinking while eating: sometimes with salty snacks Hair loss: yes, taking Biotin Carbonated beverages: no N/V/D/C: constipation (takes stool softeners) Dumping syndrome: none  Recent physical activity:  inconsistent  Progress Towards Goal(s):  In progress.  Handouts given during visit  include:  none   Nutritional Diagnosis:  [place]-3.3 Overweight/obesity related to past poor dietary habits and physical inactivity as evidenced by patient w/ recent sleeve gastrectomy surgery following dietary guidelines for continued weight loss.     Intervention:  Nutrition counseling provided.   Teaching Method Utilized:  Visual Auditory Hands on  Barriers to learning/adherence to lifestyle change: family dynamics  Demonstrated degree of understanding via:  Teach Back   Monitoring/Evaluation:  Dietary intake, exercise, and body weight. Follow up in 1 months for 8 month post-op visit.

## 2015-07-14 NOTE — Patient Instructions (Addendum)
-  Practice pre-preparing meals and developing a routine -Practice mindful eating (avoid grazing) -Make a loose meal plan that is realistic   -Make a list for the grocery store on your phone  -Make enough for leftovers  -Have some high protein options at home: Frittatas, frozen meals with meat and vegetables  -Be kind to yourself. Try not to focus on the number.  -Find ways to manage stress     Good habits to keep: -No candy bars at the grocery store -No diet soda -Reducing starchy foods like bread and tortillas -More sugar free fluid intake  Habits to redevelop: -Avoiding the drive-thru (if you have to go, choose lean meats)

## 2015-07-15 ENCOUNTER — Encounter: Payer: Self-pay | Admitting: Dietician

## 2015-08-24 ENCOUNTER — Ambulatory Visit: Payer: BLUE CROSS/BLUE SHIELD | Admitting: Dietician

## 2015-12-13 ENCOUNTER — Other Ambulatory Visit: Payer: Self-pay | Admitting: Nurse Practitioner

## 2015-12-13 DIAGNOSIS — G35 Multiple sclerosis: Secondary | ICD-10-CM

## 2016-01-11 ENCOUNTER — Ambulatory Visit
Admission: RE | Admit: 2016-01-11 | Discharge: 2016-01-11 | Disposition: A | Discharge status: Home / Self Care | Payer: 59 | Source: Ambulatory Visit | Attending: Nurse Practitioner | Admitting: Nurse Practitioner

## 2016-01-11 DIAGNOSIS — G35 Multiple sclerosis: Secondary | ICD-10-CM

## 2016-01-11 MED ORDER — GADOBENATE DIMEGLUMINE 529 MG/ML IV SOLN
18.0000 mL | Freq: Once | INTRAVENOUS | Status: AC | PRN
  Administered 2016-01-11: 18 mL via INTRAVENOUS

## 2016-01-19 DIAGNOSIS — G35 Multiple sclerosis: Secondary | ICD-10-CM | POA: Diagnosis not present

## 2016-01-19 DIAGNOSIS — Z79899 Other long term (current) drug therapy: Secondary | ICD-10-CM | POA: Diagnosis not present

## 2016-02-03 DIAGNOSIS — G35 Multiple sclerosis: Secondary | ICD-10-CM | POA: Diagnosis not present

## 2016-02-11 DIAGNOSIS — R05 Cough: Secondary | ICD-10-CM | POA: Diagnosis not present

## 2016-02-13 ENCOUNTER — Encounter: Payer: Self-pay | Admitting: Physician Assistant

## 2016-02-13 ENCOUNTER — Ambulatory Visit (INDEPENDENT_AMBULATORY_CARE_PROVIDER_SITE_OTHER): Payer: 59 | Admitting: Physician Assistant

## 2016-02-13 VITALS — BP 112/70 | HR 86 | Temp 98.3°F | Resp 16 | Ht 66.0 in | Wt 203.0 lb

## 2016-02-13 DIAGNOSIS — J320 Chronic maxillary sinusitis: Secondary | ICD-10-CM | POA: Diagnosis not present

## 2016-02-13 MED ORDER — FLUTICASONE PROPIONATE 50 MCG/ACT NA SUSP: 2.0000 | Freq: Every day | NASAL | 6 refills | Status: DC

## 2016-02-13 MED ORDER — AMOXICILLIN 875 MG PO TABS: 875.0000 mg | ORAL_TABLET | Freq: Two times a day (BID) | ORAL | 0 refills | Status: DC

## 2016-02-13 NOTE — Progress Notes (Signed)
Pre visit review using our clinic review tool, if applicable. No additional management support is needed unless otherwise documented below in the visit note. 

## 2016-02-13 NOTE — Patient Instructions (Signed)
Please take antibiotic as directed.  Increase fluid intake.  Use Saline nasal spray.  Take a daily multivitamin. Mucinex as directed.  Place a humidifier in the bedroom.  Please call or return clinic if symptoms are not improving.  In all honesty, I do not feel your symptoms match an influenza illness. I reviewed the note from [facility] but that have not completed it. Discussed with my supervising MD who agrees giving your history, would consider cessation of the Tamiflu.   Sinusitis Sinusitis is redness, soreness, and swelling (inflammation) of the paranasal sinuses. Paranasal sinuses are air pockets within the bones of your face (beneath the eyes, the middle of the forehead, or above the eyes). In healthy paranasal sinuses, mucus is able to drain out, and air is able to circulate through them by way of your nose. However, when your paranasal sinuses are inflamed, mucus and air can become trapped. This can allow bacteria and other germs to grow and cause infection. Sinusitis can develop quickly and last only a short time (acute) or continue over a long period (chronic). Sinusitis that lasts for more than 12 weeks is considered chronic.  CAUSES  Causes of sinusitis include:  Allergies.  Structural abnormalities, such as displacement of the cartilage that separates your nostrils (deviated septum), which can decrease the air flow through your nose and sinuses and affect sinus drainage.  Functional abnormalities, such as when the small hairs (cilia) that line your sinuses and help remove mucus do not work properly or are not present. SYMPTOMS  Symptoms of acute and chronic sinusitis are the same. The primary symptoms are pain and pressure around the affected sinuses. Other symptoms include:  Upper toothache.  Earache.  Headache.  Bad breath.  Decreased sense of smell and taste.  A cough, which worsens when you are lying flat.  Fatigue.  Fever.  Thick drainage from your nose, which  often is green and may contain pus (purulent).  Swelling and warmth over the affected sinuses. DIAGNOSIS  Your caregiver will perform a physical exam. During the exam, your caregiver may:  Look in your nose for signs of abnormal growths in your nostrils (nasal polyps).  Tap over the affected sinus to check for signs of infection.  View the inside of your sinuses (endoscopy) with a special imaging device with a light attached (endoscope), which is inserted into your sinuses. If your caregiver suspects that you have chronic sinusitis, one or more of the following tests may be recommended:  Allergy tests.  Nasal culture A sample of mucus is taken from your nose and sent to a lab and screened for bacteria.  Nasal cytology A sample of mucus is taken from your nose and examined by your caregiver to determine if your sinusitis is related to an allergy. TREATMENT  Most cases of acute sinusitis are related to a viral infection and will resolve on their own within 10 days. Sometimes medicines are prescribed to help relieve symptoms (pain medicine, decongestants, nasal steroid sprays, or saline sprays).  However, for sinusitis related to a bacterial infection, your caregiver will prescribe antibiotic medicines. These are medicines that will help kill the bacteria causing the infection.  Rarely, sinusitis is caused by a fungal infection. In theses cases, your caregiver will prescribe antifungal medicine. For some cases of chronic sinusitis, surgery is needed. Generally, these are cases in which sinusitis recurs more than 3 times per year, despite other treatments. HOME CARE INSTRUCTIONS   Drink plenty of water. Water helps thin  the mucus so your sinuses can drain more easily.  Use a humidifier.  Inhale steam 3 to 4 times a day (for example, sit in the bathroom with the shower running).  Apply a warm, moist washcloth to your face 3 to 4 times a day, or as directed by your caregiver.  Use saline  nasal sprays to help moisten and clean your sinuses.  Take over-the-counter or prescription medicines for pain, discomfort, or fever only as directed by your caregiver. SEEK IMMEDIATE MEDICAL CARE IF:  You have increasing pain or severe headaches.  You have nausea, vomiting, or drowsiness.  You have swelling around your face.  You have vision problems.  You have a stiff neck.  You have difficulty breathing. MAKE SURE YOU:   Understand these instructions.  Will watch your condition.  Will get help right away if you are not doing well or get worse. Document Released: 01/01/2005 Document Revised: 03/26/2011 Document Reviewed: 01/16/2011 [facility] Patient Information 2014 [place], [place].

## 2016-02-13 NOTE — Progress Notes (Signed)
Patient presents to clinic today c/o > 1 week of nasal congestion, sinus pressure with 3 days of L maxillary sinus pain/facial pain. Denies fever, chills, aches, malaise or fatigue. Went to UC 2 days ago and was told she may have the flu. Was tested and negative. Given Tamilfu to take for flu-like symptoms.   Past Medical History:  Diagnosis Date  . Anxiety and depression   . Claustrophobia   . Depression   . Elevated blood sugar 06/08/2014  . GERD (gastroesophageal reflux disease)   . Heart murmur   . Hyperlipidemia   . Infertility associated with anovulation   . 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 Migraines   . MS (multiple sclerosis) (HCC)   . Obesity in pregnancy, antepartum   . Personal history of pre-term labor   . Pregnancy induced hypertension   . Psoriasis     Current Outpatient Prescriptions on File Prior to Visit  Medication Sig Dispense Refill  . acetaminophen (TYLENOL) 500 MG tablet Take 1,000-1,500 mg by mouth daily as needed for moderate pain or headache.    Marland Kitchen CALCIUM PO Take by mouth.    . docusate sodium (COLACE) 100 MG capsule Take 100 mg by mouth.    . ferrous sulfate 325 (65 FE) MG tablet Take 325 mg by mouth.    . NASCOBAL 500 MCG/0.1ML SOLN   6  . SUMAtriptan (IMITREX) 5 MG/ACT nasal spray Place 1 spray into the nose as needed for migraine.     No current facility-administered medications on file prior to visit.     No Known Allergies  Family History  Problem Relation Age of Onset  . Heart attack Maternal Grandfather   . Cancer Maternal Grandmother     esophagus  . Alcoholism Father   . Alcoholism Maternal Grandmother   . Hyperlipidemia Mother   . Mental illness Mother   . Mental illness Maternal Grandmother   . Dementia Mother     early onset    Social History   Social History  . Marital status: Married    Spouse name: N/A  . Number of children: N/A  . Years of education: N/A   Occupational  History  . office manager    Social History Main Topics  . Smoking status: Former Smoker    Packs/day: 1.00    Years: 15.00    Types: Cigarettes    Quit date: 07/02/2010  . Smokeless tobacco: Never Used  . Alcohol use No     Comment: socially   . Drug use: No  . Sexual activity: Yes    Partners: Male   Other Topics Concern  . None   Social History Narrative   Work or School: homemaker      Home Situation: lives with husband, 2 twins (2.14 yo) and 65 month old in 2016      Spiritual Beliefs: none      Lifestyle: no regular exercise, poor diet         Review of Systems - See HPI.  All other ROS are negative.  BP 112/70   Pulse 86   Temp 98.3 F (36.8 C) (Oral)   Resp 16   Ht 5\' 6"  (1.676 m)   Wt 203 lb (92.1 kg)   SpO2 98%   BMI 32.77 kg/m   Physical Exam  Constitutional: She is oriented to person, place, and time and well-developed, well-nourished, and in no distress.  HENT:  Head: Normocephalic and  atraumatic.  Right Ear: Tympanic membrane normal.  Left Ear: Tympanic membrane normal.  Nose: Mucosal edema and rhinorrhea present. Left sinus exhibits maxillary sinus tenderness.  Mouth/Throat: Uvula is midline and oropharynx is clear and moist.  Eyes: Conjunctivae are normal.  Neck: Neck supple.  Cardiovascular: Normal rate, regular rhythm, normal heart sounds and intact distal pulses.   Pulmonary/Chest: Effort normal and breath sounds normal. No respiratory distress. She has no wheezes. She has no rales. She exhibits no tenderness.  Lymphadenopathy:    She has no cervical adenopathy.  Neurological: She is alert and oriented to person, place, and time.  Skin: Skin is warm and dry. No rash noted.  Psychiatric: Affect normal.  Vitals reviewed.  Assessment/Plan: 1. Left maxillary sinusitis Question diagnosis of flu by UC giving her lack of fever, aches, chills and acute onset of pain. Favor viral URI now with bacterial maxillary sinusitis. Rx Amoxicillin and  Flonase. Supportive measures and OTC medications as directed.   - amoxicillin (AMOXIL) 875 MG tablet; Take 1 tablet (875 mg total) by mouth 2 (two) times daily.  Dispense: 14 tablet; Refill: 0 - fluticasone (FLONASE) 50 MCG/ACT nasal spray; Place 2 sprays into both nostrils daily.  Dispense: 16 g; Refill: 6   Piedad Climes, [place]

## 2016-04-23 ENCOUNTER — Ambulatory Visit (INDEPENDENT_AMBULATORY_CARE_PROVIDER_SITE_OTHER): Payer: 59 | Admitting: Physician Assistant

## 2016-04-23 ENCOUNTER — Other Ambulatory Visit: Payer: Self-pay | Admitting: Physician Assistant

## 2016-04-23 ENCOUNTER — Telehealth: Payer: Self-pay | Admitting: Family Medicine

## 2016-04-23 ENCOUNTER — Ambulatory Visit
Admission: RE | Admit: 2016-04-23 | Discharge: 2016-04-23 | Disposition: A | Discharge status: Home / Self Care | Payer: 59 | Source: Ambulatory Visit | Attending: Physician Assistant | Admitting: Physician Assistant

## 2016-04-23 ENCOUNTER — Encounter: Payer: Self-pay | Admitting: Physician Assistant

## 2016-04-23 VITALS — BP 110/78 | HR 81 | Temp 98.7°F | Resp 14 | Ht 66.0 in | Wt 199.0 lb

## 2016-04-23 DIAGNOSIS — R1030 Lower abdominal pain, unspecified: Secondary | ICD-10-CM

## 2016-04-23 DIAGNOSIS — R1031 Right lower quadrant pain: Secondary | ICD-10-CM | POA: Diagnosis not present

## 2016-04-23 DIAGNOSIS — R1033 Periumbilical pain: Secondary | ICD-10-CM | POA: Diagnosis not present

## 2016-04-23 LAB — POCT URINALYSIS DIPSTICK
Bilirubin, UA: 1.005
GLUCOSE UA: NEGATIVE
KETONES UA: NEGATIVE
Nitrite, UA: NEGATIVE
Protein, UA: NEGATIVE
RBC UA: NEGATIVE
SPEC GRAV UA: 1.005 (ref 1.030–1.035)
Urobilinogen, UA: 0.2 (ref ?–2.0)
pH, UA: 6 (ref 5.0–8.0)

## 2016-04-23 MED ORDER — MELOXICAM 15 MG PO TABS: 15.0000 mg | ORAL_TABLET | Freq: Every day | ORAL | 0 refills | Status: DC

## 2016-04-23 MED ORDER — IOPAMIDOL (ISOVUE-300) INJECTION 61%
80.0000 mL | Freq: Once | INTRAVENOUS | Status: AC | PRN
  Administered 2016-04-23: 80 mL via INTRAVENOUS

## 2016-04-23 NOTE — Patient Instructions (Addendum)
Please stop by front desk -- they are scheduling you for a CT scan so we can rule out appendicitis. We will alter treatment based on findings. If anything acutely worsens before or during imaging, please go to the ER for emergent assessment.   Stay hydrated. Rest. We will start pain medication if CT negative for anything needing emergent treatment.

## 2016-04-23 NOTE — Telephone Encounter (Signed)
Pt states that an Rx was to be called in for her, she went to Pharmacy and nothing was there, pt states that she was to have something called in for lower abd pain. [facility] [place]

## 2016-04-23 NOTE — Progress Notes (Signed)
Patient presents to clinic today c/o 1 day of lower abdominal pain, mostly r-sided with some associated periumbilical pain. Denies fever, chills. Notes mild nausea and anorexia. Denies flank pain, change to urinary habits. Denies change to bowel habits. Last BM yesterday morning. Denies melena, hematochezia or tenesmus. Denies vaginal pain, itch or discharge. Denies dyspareunia. Denies trauma or injury. Pain is worsened with sitting from lying position or when trying to stand. Is painful with walking. Denies radiation into groin or lower extremities.  Past Medical History:  Diagnosis Date  . Anxiety and depression   . Claustrophobia   . Depression   . Elevated blood sugar 06/08/2014  . GERD (gastroesophageal reflux disease)   . Heart murmur   . Hyperlipidemia   . Infertility associated with anovulation   . 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 Migraines   . MS (multiple sclerosis) (HCC)   . Obesity in pregnancy, antepartum   . Personal history of pre-term labor   . Pregnancy induced hypertension   . Psoriasis     Current Outpatient Prescriptions on File Prior to Visit  Medication Sig Dispense Refill  . acetaminophen (TYLENOL) 500 MG tablet Take 1,000-1,500 mg by mouth daily as needed for moderate pain or headache.    Marland Kitchen CALCIUM PO Take by mouth.    . docusate sodium (COLACE) 100 MG capsule Take 100 mg by mouth.    . ferrous sulfate 325 (65 FE) MG tablet Take 325 mg by mouth.    Marland Kitchen LATUDA 40 MG TABS tablet Take 1 tablet daily    . NASCOBAL 500 MCG/0.1ML SOLN   6  . ocrelizumab (OCREVUS) 300 MG/10ML injection Inject 600 mg into the vein.    . SUMAtriptan (IMITREX) 5 MG/ACT nasal spray Place 1 spray into the nose as needed for migraine.    Marland Kitchen VIIBRYD 40 MG TABS Take 1 tablet daily    . fluticasone (FLONASE) 50 MCG/ACT nasal spray Place 2 sprays into both nostrils daily. (Patient not taking: Reported on 04/23/2016) 16 g 6   No current  facility-administered medications on file prior to visit.     No Known Allergies  Family History  Problem Relation Age of Onset  . Heart attack Maternal Grandfather   . Cancer Maternal Grandmother     esophagus  . Alcoholism Maternal Grandmother   . Mental illness Maternal Grandmother   . Alcoholism Father   . Hyperlipidemia Mother   . Mental illness Mother   . Dementia Mother     early onset    Social History   Social History  . Marital status: Married    Spouse name: N/A  . Number of children: N/A  . Years of education: N/A   Occupational History  . office manager    Social History Main Topics  . Smoking status: Former Smoker    Packs/day: 1.00    Years: 15.00    Types: Cigarettes    Quit date: 07/02/2010  . Smokeless tobacco: Never Used  . Alcohol use No     Comment: socially   . Drug use: No  . Sexual activity: Yes    Partners: Male   Other Topics Concern  . None   Social History Narrative   Work or School: homemaker      Home Situation: lives with husband, 2 twins (2.13 yo) and 10 month old in 2016      Spiritual Beliefs: none      Lifestyle: no regular  exercise, poor diet         Review of Systems - See HPI.  All other ROS are negative.  BP 110/78   Pulse 81   Temp 98.7 F (37.1 C) (Oral)   Resp 14   Ht 5\' 6"  (1.676 m)   Wt 199 lb (90.3 kg)   SpO2 99%   BMI 32.12 kg/m   Physical Exam  Constitutional: She is oriented to person, place, and time and well-developed, well-nourished, and in no distress.  HENT:  Head: Normocephalic and atraumatic.  Eyes: Conjunctivae are normal.  Neck: Neck supple.  Cardiovascular: Normal rate, regular rhythm, normal heart sounds and intact distal pulses.   Pulmonary/Chest: Effort normal and breath sounds normal. No respiratory distress. She has no wheezes. She has no rales. She exhibits no tenderness.  Abdominal: Soft. Bowel sounds are normal. There is no hepatosplenomegaly. There is tenderness in the right  lower quadrant, periumbilical area and left lower quadrant. There is tenderness at McBurney's point. There is no rigidity, no rebound, no guarding, no CVA tenderness and negative Murphy's sign.  Musculoskeletal:       Right hip: Normal.       Left hip: Normal.  Neurological: She is alert and oriented to person, place, and time.  Skin: Skin is warm and dry. No rash noted.  Psychiatric: Affect normal.  Vitals reviewed.  Assessment/Plan: 1. Lower abdominal pain 2. RLQ abdominal pain Concern for developing appendicitis. Afebrile. Non-toxic appearing. Significant RLQ without rebound or guarding. Urine dip only with trace LE. No Urinary symptoms. Urine pregnancy negative. Will obtain STAT CT today to assess for appendicitis. Pain control discussed. Discussed if any worsening, she has to go straight to the ER for assessment and intervention.   - CT Abdomen Pelvis W Contrast; Future     Piedad Climes, PA-C

## 2016-04-23 NOTE — Progress Notes (Signed)
Pre visit review using our clinic review tool, if applicable. No additional management support is needed unless otherwise documented below in the visit note. 

## 2016-04-23 NOTE — Telephone Encounter (Signed)
Rx sent to the preferred patient pharmacy  

## 2016-05-03 DIAGNOSIS — H903 Sensorineural hearing loss, bilateral: Secondary | ICD-10-CM | POA: Diagnosis not present

## 2016-05-14 DIAGNOSIS — G35 Multiple sclerosis: Secondary | ICD-10-CM | POA: Diagnosis not present

## 2016-05-14 DIAGNOSIS — H9313 Tinnitus, bilateral: Secondary | ICD-10-CM | POA: Diagnosis not present

## 2016-05-14 DIAGNOSIS — H903 Sensorineural hearing loss, bilateral: Secondary | ICD-10-CM | POA: Diagnosis not present

## 2016-06-08 ENCOUNTER — Ambulatory Visit (INDEPENDENT_AMBULATORY_CARE_PROVIDER_SITE_OTHER): Payer: 59 | Admitting: Family Medicine

## 2016-06-08 ENCOUNTER — Encounter: Payer: Self-pay | Admitting: Family Medicine

## 2016-06-08 VITALS — BP 114/78 | HR 76 | Temp 98.8°F | Resp 16 | Ht 66.0 in | Wt 205.2 lb

## 2016-06-08 DIAGNOSIS — G35 Multiple sclerosis: Secondary | ICD-10-CM

## 2016-06-08 DIAGNOSIS — F319 Bipolar disorder, unspecified: Secondary | ICD-10-CM | POA: Diagnosis not present

## 2016-06-08 DIAGNOSIS — E669 Obesity, unspecified: Secondary | ICD-10-CM

## 2016-06-08 DIAGNOSIS — G35D Multiple sclerosis, unspecified: Secondary | ICD-10-CM

## 2016-06-08 DIAGNOSIS — F603 Borderline personality disorder: Secondary | ICD-10-CM | POA: Diagnosis not present

## 2016-06-08 DIAGNOSIS — E66811 Obesity, class 1: Secondary | ICD-10-CM

## 2016-06-08 DIAGNOSIS — E785 Hyperlipidemia, unspecified: Secondary | ICD-10-CM

## 2016-06-08 LAB — CBC WITH DIFFERENTIAL/PLATELET
BASOS ABS: 0 10*3/uL (ref 0.0–0.1)
Basophils Relative: 0.9 % (ref 0.0–3.0)
Eosinophils Absolute: 0.1 10*3/uL (ref 0.0–0.7)
Eosinophils Relative: 2.1 % (ref 0.0–5.0)
HCT: 40.7 % (ref 36.0–46.0)
Hemoglobin: 13.9 g/dL (ref 12.0–15.0)
LYMPHS ABS: 0.6 10*3/uL — AB (ref 0.7–4.0)
Lymphocytes Relative: 12.7 % (ref 12.0–46.0)
MCHC: 34.2 g/dL (ref 30.0–36.0)
MCV: 94.1 fl (ref 78.0–100.0)
MONOS PCT: 9.8 % (ref 3.0–12.0)
Monocytes Absolute: 0.5 10*3/uL (ref 0.1–1.0)
NEUTROS ABS: 3.7 10*3/uL (ref 1.4–7.7)
NEUTROS PCT: 74.5 % (ref 43.0–77.0)
PLATELETS: 268 10*3/uL (ref 150.0–400.0)
RBC: 4.32 Mil/uL (ref 3.87–5.11)
RDW: 12.7 % (ref 11.5–15.5)
WBC: 5 10*3/uL (ref 4.0–10.5)

## 2016-06-08 LAB — BASIC METABOLIC PANEL
BUN: 13 mg/dL (ref 6–23)
CALCIUM: 9.4 mg/dL (ref 8.4–10.5)
CHLORIDE: 103 meq/L (ref 96–112)
CO2: 32 meq/L (ref 19–32)
Creatinine, Ser: 0.67 mg/dL (ref 0.40–1.20)
GFR: 105.7 mL/min (ref >60.00)
Glucose, Bld: 80 mg/dL (ref 70–99)
Potassium: 4.5 mEq/L (ref 3.5–5.1)
SODIUM: 140 meq/L (ref 135–145)

## 2016-06-08 LAB — LIPID PANEL
Cholesterol: 258 mg/dL — ABNORMAL HIGH (ref 0–200)
HDL: 55.6 mg/dL (ref >39.00)
NONHDL: 202.3
Total CHOL/HDL Ratio: 5
Triglycerides: 206 mg/dL — ABNORMAL HIGH (ref 0.0–149.0)
VLDL: 41.2 mg/dL — ABNORMAL HIGH (ref 0.0–40.0)

## 2016-06-08 LAB — HEPATIC FUNCTION PANEL
ALK PHOS: 51 U/L (ref 39–117)
ALT: 15 U/L (ref 0–35)
AST: 15 U/L (ref 0–37)
Albumin: 4.5 g/dL (ref 3.5–5.2)
BILIRUBIN DIRECT: 0 mg/dL (ref 0.0–0.3)
Total Bilirubin: 0.4 mg/dL (ref 0.2–1.2)
Total Protein: 7.3 g/dL (ref 6.0–8.3)

## 2016-06-08 LAB — LDL CHOLESTEROL, DIRECT: Direct LDL: 163 mg/dL

## 2016-06-08 LAB — TSH: TSH: 0.71 u[IU]/mL (ref 0.35–4.50)

## 2016-06-08 NOTE — Assessment & Plan Note (Signed)
New to provider, ongoing for pt.  S/p gastric sleeve.  Pt is not exercising nor following particular diet.  Admits to binge eating.  Check labs to risk stratify.  Stressed need for healthy diet and regular exercise.  Will follow.

## 2016-06-08 NOTE — Patient Instructions (Signed)
Schedule your complete physical in 6 months We'll notify you of your lab results and make any changes if needed Continue to work on healthy diet and regular exercise- you can do it! Please have your specialists send me copies of their notes so I can follow along Call with any questions or concerns Welcome!  We're glad to have you!

## 2016-06-08 NOTE — Assessment & Plan Note (Signed)
New to provider, following w/ psych.  Currently on [place], Wellbutrin and Vyvanse.  Will follow along.

## 2016-06-08 NOTE — Progress Notes (Signed)
   Subjective:    Patient ID: Charlene [name], female    DOB: Jan 31, 1980, 36 y.o.   MRN: [id number]  HPI New to establish.  Previous MD- Selena Batten  MS- chronic problem, dx'd in 2006.  Pt is seeing Dr Renne Crigler at [facility].  Got last Ocrevus infusion in January and will be starting new medication in July.  Hyperlipidemia- pt has hx of this.  Has never been on medication.  Lost a lot of weight w/ gastric sleeve and 'it's been kind controlled since then'.  No CP, SOB, HAs above baseline, abd pain, N/V.  Obesity- ongoing issue.  BMI is now 33.13  S/p gastric sleeve surgery.  Pt will exercise sporadically- when she does exercise, she will go to gym 4x/week.  Pt has hx of binge eating.  Bipolar/Borderline personality disorder- Dr Mila Homer at [facility].  Currently on Wellbutrin, Latuda, Viibryd and Vyvanse.  'it's a work in progress'.  Pt's last manic episode was 2 months ago.  Tends to be more depressed than manic.  When she has anxiety, will take Alprazolam as needed.   Review of Systems For ROS see HPI     Objective:   Physical Exam  Constitutional: She is oriented to person, place, and time. She appears well-developed and well-nourished. No distress.  obese  HENT:  Head: Normocephalic and atraumatic.  Eyes: Conjunctivae and EOM are normal. Pupils are equal, round, and reactive to light.  Neck: Normal range of motion. Neck supple. No thyromegaly present.  Cardiovascular: Normal rate, regular rhythm, normal heart sounds and intact distal pulses.   No murmur heard. Pulmonary/Chest: Effort normal and breath sounds normal. No respiratory distress.  Abdominal: Soft. She exhibits no distension. There is no tenderness.  Musculoskeletal: She exhibits no edema.  Lymphadenopathy:    She has no cervical adenopathy.  Neurological: She is alert and oriented to person, place, and time.  Skin: Skin is warm and dry.  Psychiatric: She has a normal mood and affect. Her behavior is normal.  Vitals  reviewed.         Assessment & Plan:

## 2016-06-08 NOTE — Progress Notes (Signed)
Pre visit review using our clinic review tool, if applicable. No additional management support is needed unless otherwise documented below in the visit note. 

## 2016-06-08 NOTE — Assessment & Plan Note (Signed)
New to provider, ongoing for pt.  Following w/ Dr Mila Homer.  Will follow along and assist as able.

## 2016-06-08 NOTE — Assessment & Plan Note (Signed)
New to provider, ongoing for pt.  She is not currently on medication.  Check labs.  Start meds prn.

## 2016-06-08 NOTE — Assessment & Plan Note (Signed)
New to provider, ongoing for pt.  Following w/ Dr Renne Crigler at [facility].  Not currently having treatment, plan is to start new medication in July.  Will follow along.

## 2016-06-12 ENCOUNTER — Other Ambulatory Visit: Payer: Self-pay | Admitting: Family Medicine

## 2016-06-12 ENCOUNTER — Other Ambulatory Visit: Payer: Self-pay | Admitting: General Practice

## 2016-06-12 DIAGNOSIS — L409 Psoriasis, unspecified: Secondary | ICD-10-CM | POA: Diagnosis not present

## 2016-06-12 DIAGNOSIS — B070 Plantar wart: Secondary | ICD-10-CM | POA: Diagnosis not present

## 2016-06-12 DIAGNOSIS — E785 Hyperlipidemia, unspecified: Secondary | ICD-10-CM

## 2016-06-12 MED ORDER — ATORVASTATIN CALCIUM 20 MG PO TABS: 20.0000 mg | ORAL_TABLET | Freq: Every day | ORAL | 6 refills | Status: DC

## 2016-07-23 ENCOUNTER — Other Ambulatory Visit (INDEPENDENT_AMBULATORY_CARE_PROVIDER_SITE_OTHER): Payer: 59

## 2016-07-23 ENCOUNTER — Encounter: Payer: Self-pay | Admitting: General Practice

## 2016-07-23 DIAGNOSIS — E785 Hyperlipidemia, unspecified: Secondary | ICD-10-CM

## 2016-07-23 LAB — HEPATIC FUNCTION PANEL
ALBUMIN: 4.2 g/dL (ref 3.5–5.2)
ALK PHOS: 44 U/L (ref 39–117)
ALT: 15 U/L (ref 0–35)
AST: 14 U/L (ref 0–37)
BILIRUBIN TOTAL: 0.3 mg/dL (ref 0.2–1.2)
Bilirubin, Direct: 0.1 mg/dL (ref 0.0–0.3)
Total Protein: 6.8 g/dL (ref 6.0–8.3)

## 2016-07-25 DIAGNOSIS — L409 Psoriasis, unspecified: Secondary | ICD-10-CM | POA: Diagnosis not present

## 2016-07-31 DIAGNOSIS — Z79899 Other long term (current) drug therapy: Secondary | ICD-10-CM | POA: Diagnosis not present

## 2016-07-31 DIAGNOSIS — G35 Multiple sclerosis: Secondary | ICD-10-CM | POA: Diagnosis not present

## 2016-08-16 DIAGNOSIS — G35 Multiple sclerosis: Secondary | ICD-10-CM | POA: Diagnosis not present

## 2016-08-16 DIAGNOSIS — H9313 Tinnitus, bilateral: Secondary | ICD-10-CM | POA: Diagnosis not present

## 2016-08-16 DIAGNOSIS — H903 Sensorineural hearing loss, bilateral: Secondary | ICD-10-CM | POA: Diagnosis not present

## 2016-10-01 DIAGNOSIS — G35 Multiple sclerosis: Secondary | ICD-10-CM | POA: Diagnosis not present

## 2016-10-08 ENCOUNTER — Telehealth: Payer: Self-pay | Admitting: Family Medicine

## 2016-10-08 MED ORDER — ROSUVASTATIN CALCIUM 10 MG PO TABS: 10.0000 mg | ORAL_TABLET | Freq: Every day | ORAL | 3 refills | Status: DC

## 2016-10-08 NOTE — Telephone Encounter (Signed)
Pt states that the lipitor is causing her to lose hair and asking if she could try crestor, [facility] in [place]

## 2016-10-08 NOTE — Telephone Encounter (Signed)
Please advise 

## 2016-10-08 NOTE — Telephone Encounter (Signed)
Crestor  daily, #30, 3 refills

## 2016-10-08 NOTE — Addendum Note (Signed)
Addended by: Geannie Risen on: 10/08/2016 02:32 PM   Modules accepted: Orders

## 2016-10-08 NOTE — Telephone Encounter (Signed)
New medication filled, will call and inform pt.

## 2016-10-17 DIAGNOSIS — L409 Psoriasis, unspecified: Secondary | ICD-10-CM | POA: Diagnosis not present

## 2016-10-18 IMAGING — CR DG UGI W/ GASTROGRAFIN
7 of 8 series · 14 of 17 positions shown · IV contrast (omnipaque)
Comparison: 11/02/2014.

CLINICAL DATA: Initial encounter for status post gastric sleeve
procedure.

EXAM:
WATER SOLUBLE UPPER GI SERIES
TECHNIQUE: Single-column upper GI series was performed using water soluble
contrast.
CONTRAST:  50mL OMNIPAQUE IOHEXOL 300 MG/ML  SOLN

[t abdomen supine]
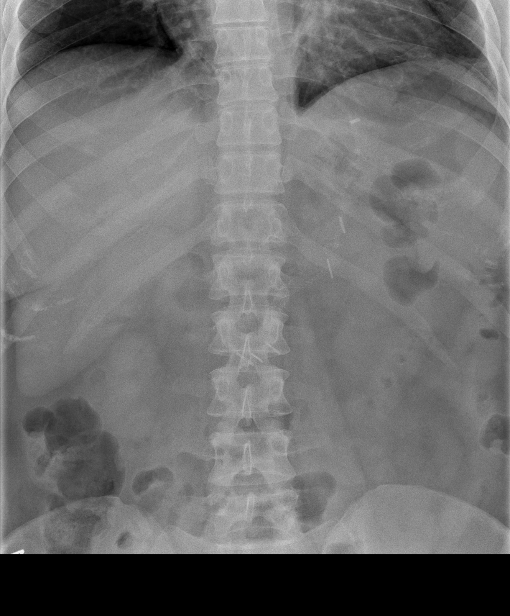

[Series 2: fluoro_barium 2fps_bw · 0.20mm/px · 3 of 22 frames shown (1 of 5)]
[frame 4/22]
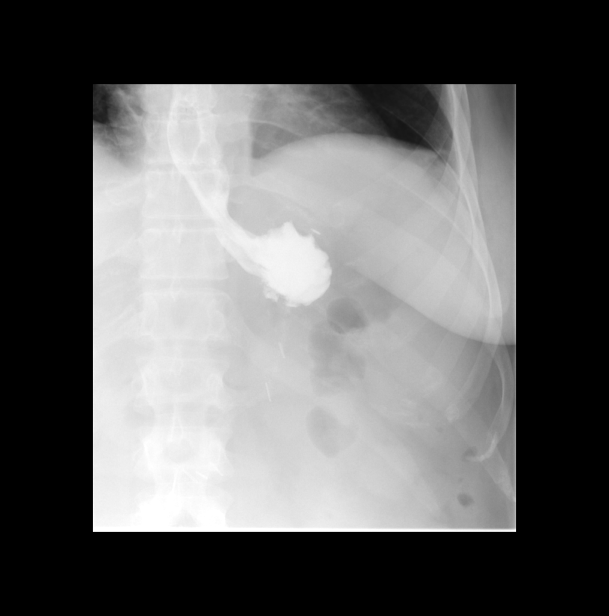
[frame 14/22]
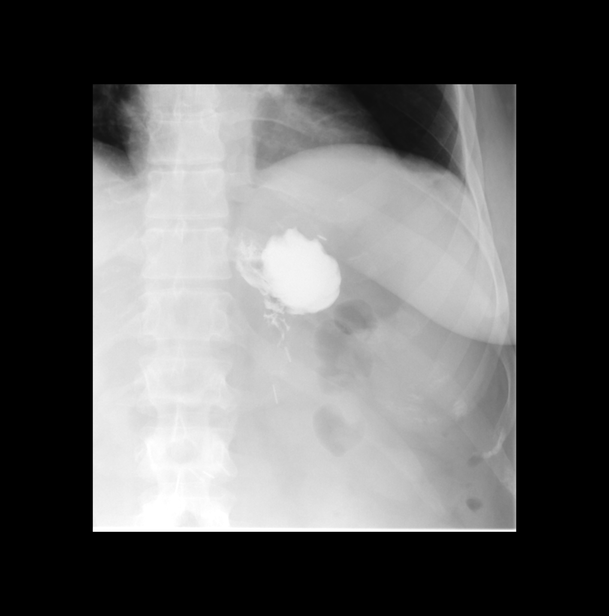
[frame 19/22]
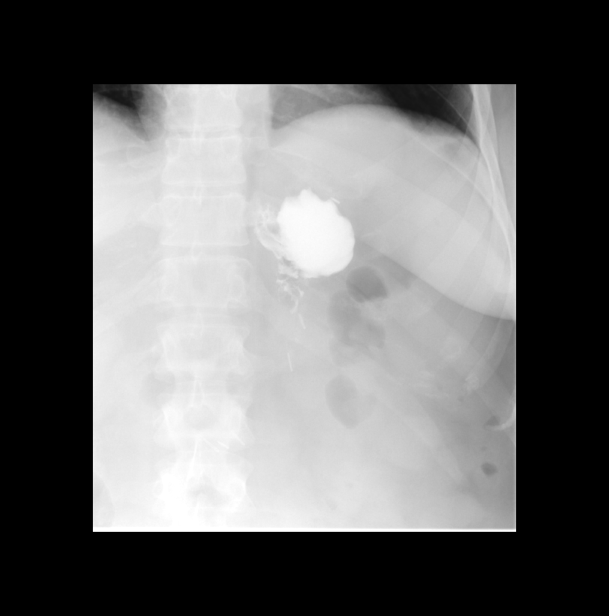

[Series 3: fluoro_barium 2fps_bw · 0.20mm/px · 3 of 19 frames shown (2 of 5)]
[frame 1/19]
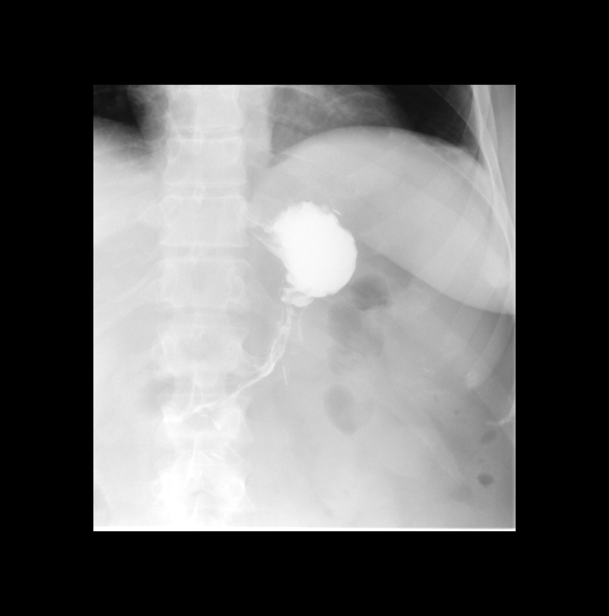
[frame 3/19]
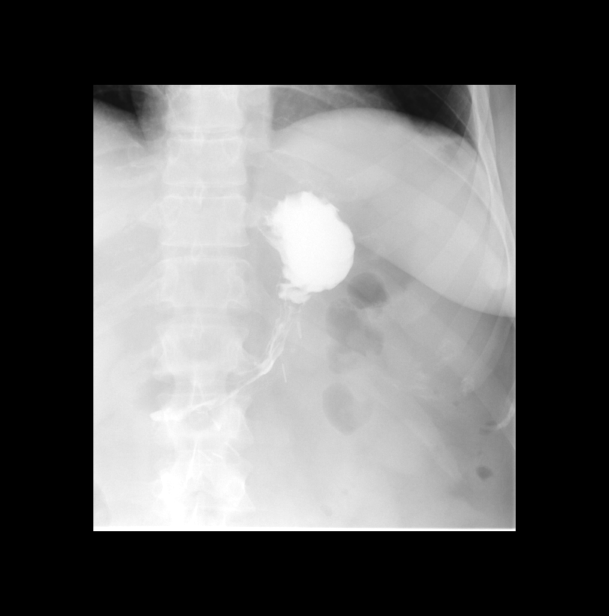
[frame 10/19]
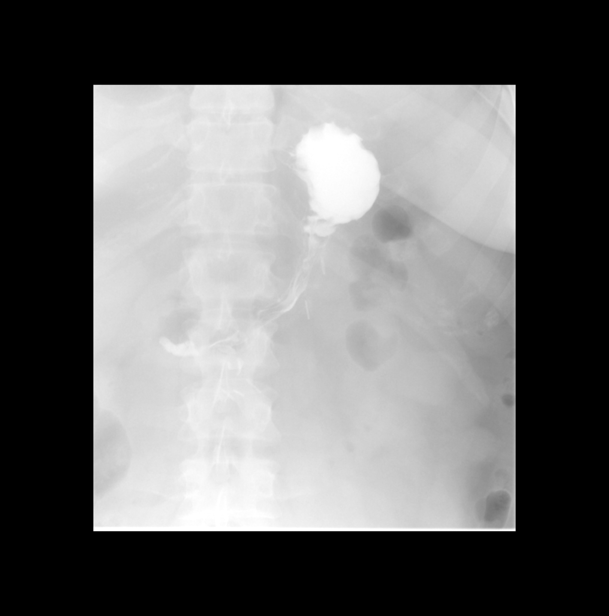

[Series 4: fluoro_barium 2fps_bw · 0.20mm/px · 4 of 17 frames shown (3 of 5)]
[frame 3/17]
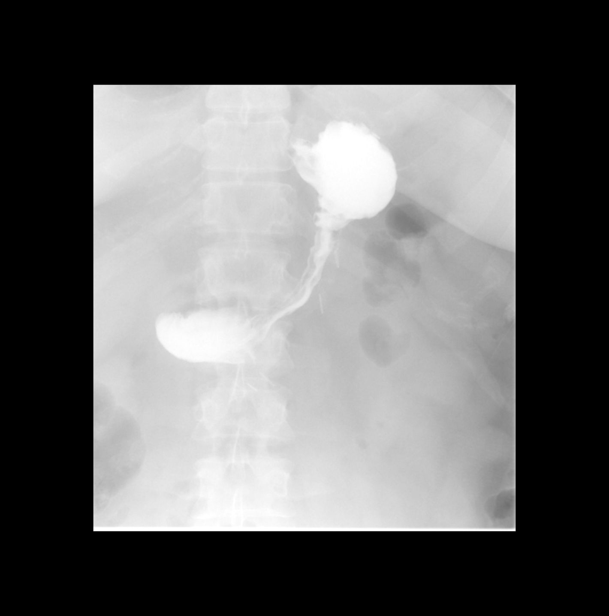
[frame 5/17]
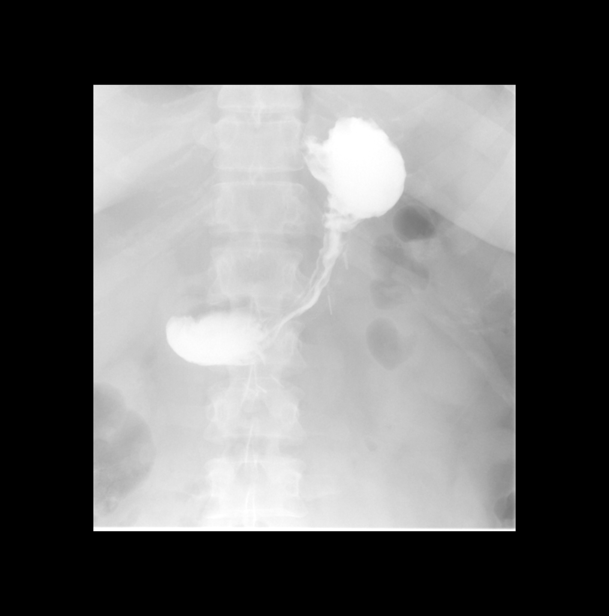
[frame 9/17]
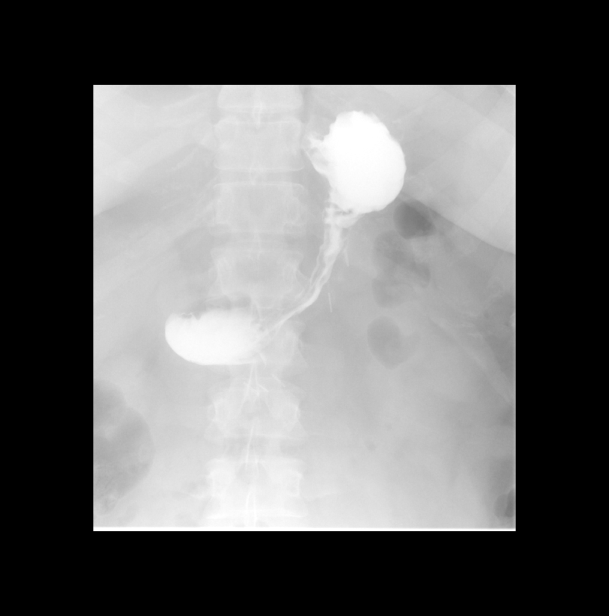
[frame 15/17]
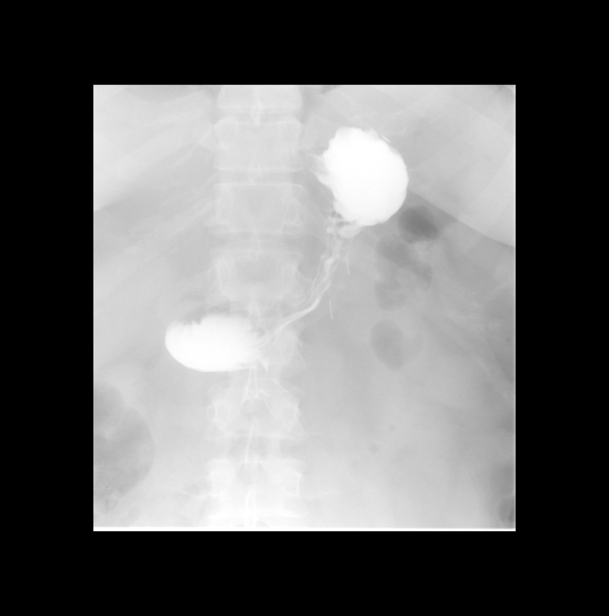

[cp_standard]
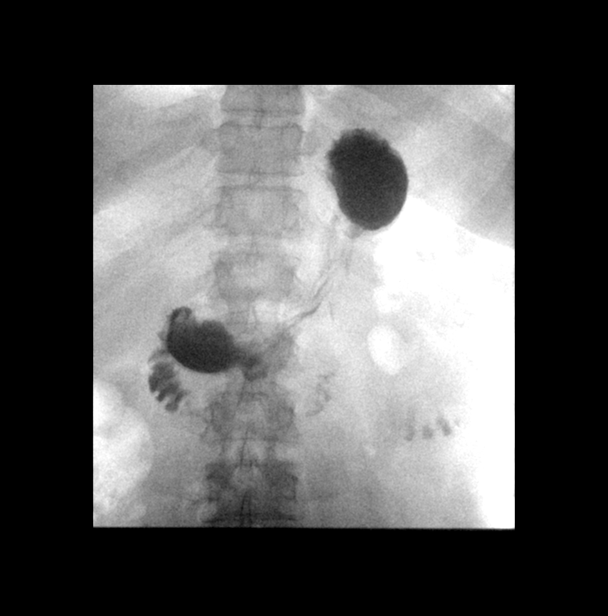

[fluoro_barium 2fps_bw (4 of 5)]
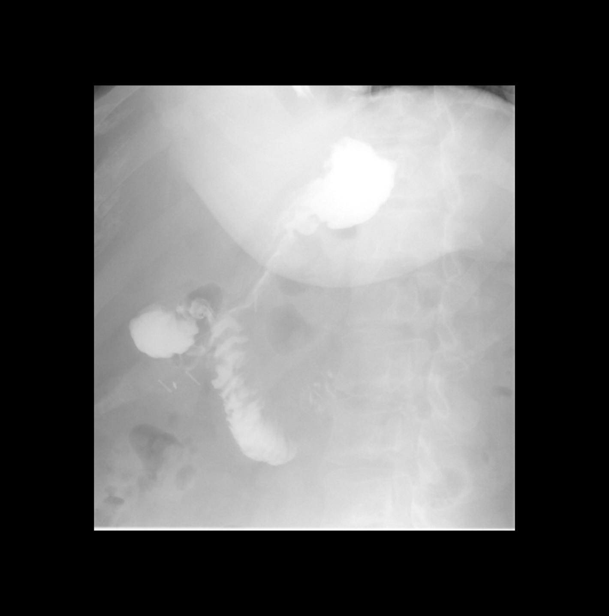

[fluoro_barium 2fps_bw (5 of 5)]
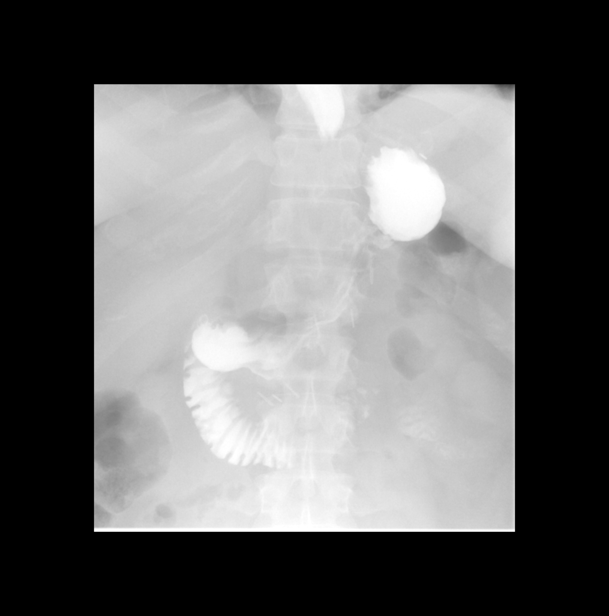

[14 of 17 positions shown; findings below may reference images not displayed]

FLUOROSCOPY TIME:  Radiation Exposure Index (as provided by the
fluoroscopic device):

If the device does not provide the exposure index:

Fluoroscopy Time (in minutes and seconds):  0 minutes and 49 seconds

Number of Acquired Images:
FINDINGS: Patient was given small volume water-soluble contrast by mouth.
Contrast material flows freely into the proximal stomach and then
passes through the gastric sleeve to the antral pyloric region. No
evidence for contrast extravasation along the gastric suture line.
The contrast then is visualized flowing through the pylorus into a
nondilated duodenum. No evidence for duodenal fold thickening.
Duodenal wall and proximal jejunal peristalsis is slightly sluggish.
IMPRESSION: No evidence for gastric obstruction. No contrast extravasation from
the lumen of the stomach to suggest leak.

## 2016-12-12 DIAGNOSIS — L409 Psoriasis, unspecified: Secondary | ICD-10-CM | POA: Diagnosis not present

## 2016-12-31 ENCOUNTER — Other Ambulatory Visit: Payer: Self-pay | Admitting: Nurse Practitioner

## 2016-12-31 DIAGNOSIS — G35 Multiple sclerosis: Secondary | ICD-10-CM

## 2017-01-10 DIAGNOSIS — H109 Unspecified conjunctivitis: Secondary | ICD-10-CM | POA: Diagnosis not present

## 2017-01-13 ENCOUNTER — Other Ambulatory Visit: Payer: 59

## 2017-01-14 ENCOUNTER — Ambulatory Visit
Admission: RE | Admit: 2017-01-14 | Discharge: 2017-01-14 | Disposition: A | Discharge status: Home / Self Care | Payer: 59 | Source: Ambulatory Visit | Attending: Nurse Practitioner | Admitting: Nurse Practitioner

## 2017-01-14 DIAGNOSIS — G35 Multiple sclerosis: Secondary | ICD-10-CM | POA: Diagnosis not present

## 2017-01-14 MED ORDER — GADOBENATE DIMEGLUMINE 529 MG/ML IV SOLN
20.0000 mL | Freq: Once | INTRAVENOUS | Status: AC | PRN
  Administered 2017-01-14: 20 mL via INTRAVENOUS

## 2017-01-17 DIAGNOSIS — G35 Multiple sclerosis: Secondary | ICD-10-CM | POA: Diagnosis not present

## 2017-01-17 DIAGNOSIS — Z79899 Other long term (current) drug therapy: Secondary | ICD-10-CM | POA: Diagnosis not present

## 2017-01-27 ENCOUNTER — Other Ambulatory Visit: Payer: Self-pay | Admitting: Family Medicine

## 2017-02-20 DIAGNOSIS — G35 Multiple sclerosis: Secondary | ICD-10-CM | POA: Diagnosis not present

## 2017-04-08 ENCOUNTER — Telehealth: Payer: Self-pay | Admitting: General Practice

## 2017-04-08 MED ORDER — ROSUVASTATIN CALCIUM 10 MG PO TABS: 10.0000 mg | ORAL_TABLET | Freq: Every day | ORAL | 1 refills | Status: DC

## 2017-04-08 NOTE — Telephone Encounter (Signed)
Med filled per PCP.

## 2017-04-08 NOTE — Telephone Encounter (Signed)
Ok for #90, 1 refill 

## 2017-04-08 NOTE — Telephone Encounter (Signed)
Last OV 06/08/16, no upcoming appts crestor last filled 01/28/17 #30 with 3  Per pt insurance she needs to have a 90 day rx.

## 2017-04-22 ENCOUNTER — Other Ambulatory Visit: Payer: Self-pay | Admitting: Family Medicine

## 2017-04-22 DIAGNOSIS — L659 Nonscarring hair loss, unspecified: Secondary | ICD-10-CM | POA: Diagnosis not present

## 2017-04-22 DIAGNOSIS — L409 Psoriasis, unspecified: Secondary | ICD-10-CM | POA: Diagnosis not present

## 2017-04-29 DIAGNOSIS — Z87891 Personal history of nicotine dependence: Secondary | ICD-10-CM | POA: Diagnosis not present

## 2017-04-29 DIAGNOSIS — E785 Hyperlipidemia, unspecified: Secondary | ICD-10-CM | POA: Diagnosis not present

## 2017-04-29 DIAGNOSIS — R918 Other nonspecific abnormal finding of lung field: Secondary | ICD-10-CM | POA: Diagnosis not present

## 2017-04-29 DIAGNOSIS — J189 Pneumonia, unspecified organism: Secondary | ICD-10-CM | POA: Diagnosis not present

## 2017-04-29 DIAGNOSIS — R05 Cough: Secondary | ICD-10-CM | POA: Diagnosis not present

## 2017-07-17 DIAGNOSIS — G35 Multiple sclerosis: Secondary | ICD-10-CM | POA: Diagnosis not present

## 2017-08-07 DIAGNOSIS — G35 Multiple sclerosis: Secondary | ICD-10-CM | POA: Diagnosis not present

## 2017-10-11 DIAGNOSIS — B070 Plantar wart: Secondary | ICD-10-CM | POA: Diagnosis not present

## 2017-10-11 DIAGNOSIS — L409 Psoriasis, unspecified: Secondary | ICD-10-CM | POA: Diagnosis not present

## 2017-10-11 DIAGNOSIS — L658 Other specified nonscarring hair loss: Secondary | ICD-10-CM | POA: Diagnosis not present

## 2017-10-11 DIAGNOSIS — L650 Telogen effluvium: Secondary | ICD-10-CM | POA: Diagnosis not present

## 2017-10-21 ENCOUNTER — Other Ambulatory Visit: Payer: Self-pay | Admitting: Family Medicine

## 2017-10-31 ENCOUNTER — Other Ambulatory Visit: Payer: Self-pay | Admitting: Family Medicine

## 2017-12-08 DIAGNOSIS — M25572 Pain in left ankle and joints of left foot: Secondary | ICD-10-CM | POA: Diagnosis not present

## 2017-12-20 ENCOUNTER — Other Ambulatory Visit: Payer: Self-pay | Admitting: Neurology

## 2017-12-20 DIAGNOSIS — G35 Multiple sclerosis: Secondary | ICD-10-CM

## 2017-12-23 DIAGNOSIS — M7672 Peroneal tendinitis, left leg: Secondary | ICD-10-CM | POA: Diagnosis not present

## 2018-01-13 ENCOUNTER — Ambulatory Visit
Admission: RE | Admit: 2018-01-13 | Discharge: 2018-01-13 | Disposition: A | Discharge status: Home / Self Care | Payer: 59 | Source: Ambulatory Visit | Attending: Neurology | Admitting: Neurology

## 2018-01-13 DIAGNOSIS — G35 Multiple sclerosis: Secondary | ICD-10-CM | POA: Diagnosis not present

## 2018-01-13 MED ORDER — GADOBENATE DIMEGLUMINE 529 MG/ML IV SOLN
19.0000 mL | Freq: Once | INTRAVENOUS | Status: AC | PRN
  Administered 2018-01-13: 19 mL via INTRAVENOUS

## 2018-01-20 DIAGNOSIS — G35 Multiple sclerosis: Secondary | ICD-10-CM | POA: Diagnosis not present

## 2018-02-11 DIAGNOSIS — G35 Multiple sclerosis: Secondary | ICD-10-CM | POA: Diagnosis not present

## 2018-04-09 DIAGNOSIS — L658 Other specified nonscarring hair loss: Secondary | ICD-10-CM | POA: Diagnosis not present

## 2018-05-01 ENCOUNTER — Ambulatory Visit (INDEPENDENT_AMBULATORY_CARE_PROVIDER_SITE_OTHER): Payer: 59 | Admitting: Family Medicine

## 2018-05-01 ENCOUNTER — Encounter: Payer: Self-pay | Admitting: Family Medicine

## 2018-05-01 ENCOUNTER — Other Ambulatory Visit: Payer: Self-pay

## 2018-05-01 VITALS — Wt 227.0 lb

## 2018-05-01 DIAGNOSIS — F1010 Alcohol abuse, uncomplicated: Secondary | ICD-10-CM

## 2018-05-01 DIAGNOSIS — E785 Hyperlipidemia, unspecified: Secondary | ICD-10-CM

## 2018-05-01 NOTE — Progress Notes (Signed)
Virtual Visit via Video   I connected with Georgianne Fick on 05/01/18 at  3:20 PM EDT by a video enabled telemedicine application and verified that I am speaking with the correct person using two identifiers. Location patient: Home Location provider: Astronomer, Office Persons participating in the virtual visit: pt and myself  I discussed the limitations of evaluation and management by telemedicine and the availability of in person appointments. The patient expressed understanding and agreed to proceed.  Subjective:   HPI:  Hyperlipidemia- pt hasn't been in nearly 2 years.  'i'm not taking care of myself like I should be'.  Prior to COVID joined [employer] and had lost weight.  Now with the crisis is 'off the wagon again'.  Alcohol abuse- 'I drink way too much and i've been doing it for awhile'.  'I definitely have a problem'.  Working on it w/ therapist.  'it's not a hurdle i'm ready to jump over'.  Drinking daily- rum is drink of choice.  Starts in the afternoon and drinks until bed.  Goes through 'big bottle from [facility] store in 4-5 days'.  Went to 1 AA meeting and 'hated it'.  Was previously on Naltrexone which was working but she stopped it when she wanted to drink for her bday.  Pt plans to restart Naltrexone once COVID crisis is over.  Obesity- BMI is 36.64  ROS: See pertinent positives and negatives per HPI.  Patient Active Problem List   Diagnosis Date Noted  . Bipolar disorder (HCC) 06/08/2016  . Borderline personality disorder (HCC) 06/08/2016  . Obesity (BMI 30.0-34.9) 12/07/2014  . Hyperlipemia 06/08/2014  . Elevated blood sugar 06/08/2014  . Psoriasis 06/08/2014  . Migraines 06/08/2014  . Multiple sclerosis (HCC)   . Adjustment disorder with anxiety 01/05/2008    Social History   Tobacco Use  . Smoking status: Former Smoker    Packs/day: 1.00    Years: 15.00    Pack years: 15.00    Types: Cigarettes    Last attempt to quit: 07/02/2010    Years  since quitting: 7.8  . Smokeless tobacco: Never Used  Substance Use Topics  . Alcohol use: No    Comment: socially     Current Outpatient Medications:  .  acetaminophen (TYLENOL) 500 MG tablet, Take 1,000-1,500 mg by mouth daily as needed for moderate pain or headache., Disp: , Rfl:  .  amphetamine-dextroamphetamine (ADDERALL) 20 MG tablet, Take 20 mg by mouth daily., Disp: , Rfl:  .  docusate sodium (COLACE) 100 MG capsule, Take 100 mg by mouth., Disp: , Rfl:  .  iron polysaccharides (NIFEREX) 150 MG capsule, Take 150 mg by mouth daily., Disp: , Rfl:  .  LATUDA 40 MG TABS tablet, Take 1 tablet daily, Disp: , Rfl:  .  spironolactone (ALDACTONE) 50 MG tablet, Take 50 mg by mouth daily., Disp: , Rfl:  .  SUMAtriptan (IMITREX) 5 MG/ACT nasal spray, Place 1 spray into the nose as needed for migraine., Disp: , Rfl:  .  vortioxetine HBr (TRINTELLIX) 20 MG TABS tablet, Take 20 mg by mouth daily., Disp: , Rfl:  .  buPROPion (WELLBUTRIN XL) 300 MG 24 hr tablet, Take 300 mg by mouth every morning., Disp: , Rfl: 2 .  CALCIUM PO, Take by mouth., Disp: , Rfl:  .  ferrous sulfate 325 (65 FE) MG tablet, Take 325 mg by mouth., Disp: , Rfl:  .  NASCOBAL 500 MCG/0.1ML SOLN, , Disp: , Rfl: 6 .  rosuvastatin (CRESTOR)  10 MG tablet, TAKE 1 TABLET BY MOUTH EVERY DAY (Patient not taking: Reported on 05/01/2018), Disp: 90 tablet, Rfl: 1 .  VIIBRYD 40 MG TABS, Take 1 tablet daily, Disp: , Rfl:  .  VYVANSE 60 MG capsule, Take 60 mg by mouth every morning., Disp: , Rfl: 0  No Known Allergies  Objective:   Wt 227 lb (103 kg)   BMI 36.64 kg/m  AAOx3, NAD NCAT, EOMI No obvious CN deficits Coloring WNL Pt is able to speak clearly, coherently without shortness of breath or increased work of breathing.  Thought process is linear.  Mood is appropriate.   Assessment and Plan:   Hyperlipidemia- chronic problem.  Not currently on medication.  Admits to not taking care of herself.  Will check labs and restart meds  if needed.  Pt expressed understanding and is in agreement w/ plan.   Alcohol abuse- new.  Pt reports she is drinking significant amounts of rum daily.  She discusses this w/ therapist, tried AA meeting (hated it), was previously on Naltrexone.  States she is not willing to restart Naltrexone until COVID crisis has passed.  Will check LFTs to assess damage.  Obesity- pt's BMI is 36.  Stressed need for healthy diet and regular exercise.  Pt is doing neither.  Check labs to risk stratify.  Will follow.   Neena RhymesKatherine Briauna Gilmartin, MD 05/01/2018

## 2018-05-05 ENCOUNTER — Other Ambulatory Visit (INDEPENDENT_AMBULATORY_CARE_PROVIDER_SITE_OTHER): Payer: 59

## 2018-05-05 DIAGNOSIS — F1010 Alcohol abuse, uncomplicated: Secondary | ICD-10-CM

## 2018-05-05 DIAGNOSIS — E785 Hyperlipidemia, unspecified: Secondary | ICD-10-CM

## 2018-05-05 DIAGNOSIS — R739 Hyperglycemia, unspecified: Secondary | ICD-10-CM

## 2018-05-05 LAB — BASIC METABOLIC PANEL
BUN: 12 mg/dL (ref 6–23)
CO2: 29 mEq/L (ref 19–32)
Calcium: 9.3 mg/dL (ref 8.4–10.5)
Chloride: 102 mEq/L (ref 96–112)
Creatinine, Ser: 0.73 mg/dL (ref 0.40–1.20)
GFR: 89.14 mL/min (ref >60.00)
Glucose, Bld: 86 mg/dL (ref 70–99)
Potassium: 4.6 mEq/L (ref 3.5–5.1)
Sodium: 138 mEq/L (ref 135–145)

## 2018-05-05 LAB — HEPATIC FUNCTION PANEL
ALT: 15 U/L (ref 0–35)
AST: 18 U/L (ref 0–37)
Albumin: 4.1 g/dL (ref 3.5–5.2)
Alkaline Phosphatase: 71 U/L (ref 39–117)
Bilirubin, Direct: 0 mg/dL (ref 0.0–0.3)
Total Bilirubin: 0.4 mg/dL (ref 0.2–1.2)
Total Protein: 6.9 g/dL (ref 6.0–8.3)

## 2018-05-05 LAB — CBC WITH DIFFERENTIAL/PLATELET
Basophils Absolute: 0 10*3/uL (ref 0.0–0.1)
Basophils Relative: 0.8 % (ref 0.0–3.0)
Eosinophils Absolute: 0.1 10*3/uL (ref 0.0–0.7)
Eosinophils Relative: 2.4 % (ref 0.0–5.0)
HCT: 39.8 % (ref 36.0–46.0)
Hemoglobin: 13.8 g/dL (ref 12.0–15.0)
Lymphocytes Relative: 15.1 % (ref 12.0–46.0)
Lymphs Abs: 0.7 10*3/uL (ref 0.7–4.0)
MCHC: 34.7 g/dL (ref 30.0–36.0)
MCV: 93.9 fl (ref 78.0–100.0)
Monocytes Absolute: 0.5 10*3/uL (ref 0.1–1.0)
Monocytes Relative: 10.4 % (ref 3.0–12.0)
Neutro Abs: 3.4 10*3/uL (ref 1.4–7.7)
Neutrophils Relative %: 71.3 % (ref 43.0–77.0)
Platelets: 259 10*3/uL (ref 150.0–400.0)
RBC: 4.24 Mil/uL (ref 3.87–5.11)
RDW: 13.6 % (ref 11.5–15.5)
WBC: 4.7 10*3/uL (ref 4.0–10.5)

## 2018-05-05 LAB — LIPID PANEL
Cholesterol: 297 mg/dL — ABNORMAL HIGH (ref 0–200)
HDL: 57.2 mg/dL (ref >39.00)
LDL Cholesterol: 210 mg/dL — ABNORMAL HIGH (ref 0–99)
NonHDL: 239.97
Total CHOL/HDL Ratio: 5
Triglycerides: 152 mg/dL — ABNORMAL HIGH (ref 0.0–149.0)
VLDL: 30.4 mg/dL (ref 0.0–40.0)

## 2018-05-05 LAB — HEMOGLOBIN A1C: Hgb A1c MFr Bld: 4.9 % (ref 4.6–6.5)

## 2018-05-05 LAB — TSH: TSH: 1.54 u[IU]/mL (ref 0.35–4.50)

## 2018-05-06 ENCOUNTER — Other Ambulatory Visit: Payer: Self-pay | Admitting: *Deleted

## 2018-05-06 LAB — LIPID PANEL
Cholesterol: 302 mg/dL — ABNORMAL HIGH (ref 0–200)
HDL: 57.5 mg/dL (ref >39.00)
LDL Cholesterol: 214 mg/dL — ABNORMAL HIGH (ref 0–99)
NonHDL: 244.58
Total CHOL/HDL Ratio: 5
Triglycerides: 155 mg/dL — ABNORMAL HIGH (ref 0.0–149.0)
VLDL: 31 mg/dL (ref 0.0–40.0)

## 2018-05-06 MED ORDER — ROSUVASTATIN CALCIUM 10 MG PO TABS: 10.0000 mg | ORAL_TABLET | Freq: Every day | ORAL | 1 refills | Status: DC

## 2018-08-06 ENCOUNTER — Other Ambulatory Visit: Payer: Self-pay

## 2018-08-06 ENCOUNTER — Encounter: Payer: Self-pay | Admitting: Family Medicine

## 2018-08-06 ENCOUNTER — Ambulatory Visit (INDEPENDENT_AMBULATORY_CARE_PROVIDER_SITE_OTHER): Payer: 59 | Admitting: Family Medicine

## 2018-08-06 VITALS — Temp 98.0°F | Ht 66.5 in | Wt 241.0 lb

## 2018-08-06 DIAGNOSIS — B9689 Other specified bacterial agents as the cause of diseases classified elsewhere: Secondary | ICD-10-CM | POA: Diagnosis not present

## 2018-08-06 DIAGNOSIS — J329 Chronic sinusitis, unspecified: Secondary | ICD-10-CM

## 2018-08-06 MED ORDER — AMOXICILLIN 875 MG PO TABS: 875.0000 mg | ORAL_TABLET | Freq: Two times a day (BID) | ORAL | 0 refills | Status: DC

## 2018-08-06 NOTE — Progress Notes (Signed)
Virtual Visit via Video   I connected with patient on 08/06/18 at  4:00 PM EDT by a video enabled telemedicine application and verified that I am speaking with the correct person using two identifiers.  Location patient: Home Location provider: Astronomer, Office Persons participating in the virtual visit: Patient, Provider, CMA (Jess B)  I discussed the limitations of evaluation and management by telemedicine and the availability of in person appointments. The patient expressed understanding and agreed to proceed.  Subjective:   HPI:   URI- 'I think I have a sinus infxn'.  Reports pain in nose, behind R eye, nasal congestion.  Took OTC sudafed pain and pressure w/ '80% improvement'.  No fever.  Denies generalized HA.  Sore throat on the first night but that resolved when sinus pressure started.  No cough.  No known sick contacts.  Pt denies possible COVID exposure- husband works from home, pick up groceries.  They are taking quarantine very seriously.  'feels very similar to previous infxn'.  ROS:   See pertinent positives and negatives per HPI.  Patient Active Problem List   Diagnosis Date Noted  . Alcohol abuse 05/01/2018  . Bipolar disorder (HCC) 06/08/2016  . Borderline personality disorder (HCC) 06/08/2016  . Hyperlipemia 06/08/2014  . Elevated blood sugar 06/08/2014  . Psoriasis 06/08/2014  . Migraines 06/08/2014  . Morbid obesity, unspecified obesity type (HCC)   . Multiple sclerosis (HCC)   . Adjustment disorder with anxiety 01/05/2008    Social History   Tobacco Use  . Smoking status: Former Smoker    Packs/day: 1.00    Years: 15.00    Pack years: 15.00    Types: Cigarettes    Quit date: 07/02/2010    Years since quitting: 8.1  . Smokeless tobacco: Never Used  Substance Use Topics  . Alcohol use: No    Comment: socially     Current Outpatient Medications:  .  acetaminophen (TYLENOL) 500 MG tablet, Take 1,000-1,500 mg by mouth daily as needed  for moderate pain or headache., Disp: , Rfl:  .  amphetamine-dextroamphetamine (ADDERALL) 20 MG tablet, Take 20 mg by mouth daily., Disp: , Rfl:  .  docusate sodium (COLACE) 100 MG capsule, Take 100 mg by mouth., Disp: , Rfl:  .  LATUDA 40 MG TABS tablet, Take 1 tablet daily, Disp: , Rfl:  .  rosuvastatin (CRESTOR) 10 MG tablet, Take 1 tablet (10 mg total) by mouth daily., Disp: 90 tablet, Rfl: 1 .  spironolactone (ALDACTONE) 50 MG tablet, Take 50 mg by mouth daily., Disp: , Rfl:  .  SUMAtriptan (IMITREX) 5 MG/ACT nasal spray, Place 1 spray into the nose as needed for migraine., Disp: , Rfl:  .  vortioxetine HBr (TRINTELLIX) 20 MG TABS tablet, Take 20 mg by mouth daily., Disp: , Rfl:   Allergies  Allergen Reactions  . Ciprofloxacin     Other reaction(s): Mental Status Changes  Manic episode    . Prednisone     Other reaction(s):  Puts her in manic episode. She is Bipolar      Objective:   Temp 98 F (36.7 C) (Oral)   Ht 5' 6.5" (1.689 m)   Wt 241 lb (109.3 kg)   BMI 38.32 kg/m   AAOx3, NAD NCAT, EOMI No obvious CN deficits Coloring WNL Pt is able to speak clearly, coherently without shortness of breath or increased work of breathing.  Thought process is linear.  Mood is appropriate.   Assessment and Plan:  Sinusitis- new.  Pt reports this feels similar to previous sinus infxn.  Did discuss possibility of COVID but this is unlikely given the strict quarantine measures they have been following.  Start Amoxicillin and told pt that if sxs don't resolve w/ treatment we would then need to consider COVID testing.  Pt expressed understanding and is in agreement w/ plan.    Annye Asa, MD 08/06/2018

## 2018-08-06 NOTE — Progress Notes (Signed)
I have discussed the procedure for the virtual visit with the patient who has given consent to proceed with assessment and treatment.   sudafed PE helped today, facial pain, pain behind eyes. and congestion  Charlene [name], CMA

## 2018-11-07 ENCOUNTER — Other Ambulatory Visit: Payer: Self-pay | Admitting: General Practice

## 2018-11-07 MED ORDER — ROSUVASTATIN CALCIUM 10 MG PO TABS: 10.0000 mg | ORAL_TABLET | Freq: Every day | ORAL | 1 refills | Status: DC

## 2018-12-26 ENCOUNTER — Other Ambulatory Visit: Payer: Self-pay | Admitting: Nurse Practitioner

## 2018-12-26 DIAGNOSIS — G35 Multiple sclerosis: Secondary | ICD-10-CM

## 2019-02-23 ENCOUNTER — Other Ambulatory Visit: Payer: 59

## 2019-03-27 ENCOUNTER — Ambulatory Visit: Payer: 59 | Attending: Internal Medicine

## 2019-03-27 DIAGNOSIS — Z23 Encounter for immunization: Secondary | ICD-10-CM

## 2019-03-27 NOTE — Progress Notes (Signed)
   Covid-19 Vaccination Clinic  Name:  Charlene [name]    MRN: [id number] DOB: 1980/04/17  03/27/2019  Charlene [name] was observed post Covid-19 immunization for 15 minutes without incident. She was provided with Vaccine Information Sheet and instruction to access the V-Safe system.   Charlene [name] was instructed to call 911 with any severe reactions post vaccine: Marland Kitchen Difficulty breathing  . Swelling of face and throat  . A fast heartbeat  . A bad rash all over body  . Dizziness and weakness   Immunizations Administered    Name Date Dose VIS Date Route   Pfizer COVID-19 Vaccine 03/27/2019 11:44 AM 0.3 mL 12/26/2018 Intramuscular   Manufacturer: [employer], [employer]   Lot: [id number]   NDC: [address]-2

## 2019-04-20 ENCOUNTER — Ambulatory Visit: Payer: 59 | Attending: Internal Medicine

## 2019-04-20 DIAGNOSIS — Z23 Encounter for immunization: Secondary | ICD-10-CM

## 2019-04-20 NOTE — Progress Notes (Signed)
   Covid-19 Vaccination Clinic  Name:  Charlene [name]    MRN: [id number] DOB: 1980/11/24  04/20/2019  [name] was observed post Covid-19 immunization for 15 minutes without incident. She was provided with Vaccine Information Sheet and instruction to access the V-Safe system.   [name] was instructed to call 911 with any severe reactions post vaccine: Marland Kitchen Difficulty breathing  . Swelling of face and throat  . A fast heartbeat  . A bad rash all over body  . Dizziness and weakness   Immunizations Administered    Name Date Dose VIS Date Route   Pfizer COVID-19 Vaccine 04/20/2019  2:56 PM 0.3 mL 12/26/2018 Intramuscular   Manufacturer: [employer], [employer]   Lot: [id number]   NDC: [address]-5

## 2019-05-07 ENCOUNTER — Ambulatory Visit
Admission: RE | Admit: 2019-05-07 | Discharge: 2019-05-07 | Disposition: A | Discharge status: Home / Self Care | Payer: 59 | Source: Ambulatory Visit | Attending: Nurse Practitioner | Admitting: Nurse Practitioner

## 2019-05-07 ENCOUNTER — Other Ambulatory Visit: Payer: Self-pay

## 2019-05-07 DIAGNOSIS — G35 Multiple sclerosis: Secondary | ICD-10-CM

## 2019-05-07 MED ORDER — GADOBENATE DIMEGLUMINE 529 MG/ML IV SOLN
20.0000 mL | Freq: Once | INTRAVENOUS | Status: AC | PRN
  Administered 2019-05-07: 12:00:00 20 mL via INTRAVENOUS

## 2019-08-12 ENCOUNTER — Ambulatory Visit: Payer: 59 | Attending: Internal Medicine

## 2019-08-12 DIAGNOSIS — Z20822 Contact with and (suspected) exposure to covid-19: Secondary | ICD-10-CM

## 2019-08-13 LAB — SARS-COV-2, NAA 2 DAY TAT

## 2019-08-13 LAB — NOVEL CORONAVIRUS, NAA: SARS-CoV-2, NAA: NOT DETECTED

## 2019-12-22 ENCOUNTER — Encounter: Payer: Self-pay | Admitting: Family Medicine

## 2019-12-22 ENCOUNTER — Other Ambulatory Visit: Payer: Self-pay

## 2019-12-22 ENCOUNTER — Ambulatory Visit (INDEPENDENT_AMBULATORY_CARE_PROVIDER_SITE_OTHER): Payer: 59 | Admitting: Family Medicine

## 2019-12-22 VITALS — BP 117/72 | HR 84 | Temp 97.7°F | Resp 18 | Ht 66.5 in | Wt 222.2 lb

## 2019-12-22 DIAGNOSIS — E669 Obesity, unspecified: Secondary | ICD-10-CM

## 2019-12-22 DIAGNOSIS — F1010 Alcohol abuse, uncomplicated: Secondary | ICD-10-CM

## 2019-12-22 DIAGNOSIS — K625 Hemorrhage of anus and rectum: Secondary | ICD-10-CM

## 2019-12-22 DIAGNOSIS — E66811 Obesity, class 1: Secondary | ICD-10-CM

## 2019-12-22 LAB — BASIC METABOLIC PANEL
BUN: 14 mg/dL (ref 6–23)
CO2: 29 mEq/L (ref 19–32)
Calcium: 9.5 mg/dL (ref 8.4–10.5)
Chloride: 100 mEq/L (ref 96–112)
Creatinine, Ser: 0.7 mg/dL (ref 0.40–1.20)
GFR: 108.66 mL/min (ref >60.00)
Glucose, Bld: 86 mg/dL (ref 70–99)
Potassium: 4.8 mEq/L (ref 3.5–5.1)
Sodium: 137 mEq/L (ref 135–145)

## 2019-12-22 LAB — CBC WITH DIFFERENTIAL/PLATELET
Basophils Absolute: 0 10*3/uL (ref 0.0–0.1)
Basophils Relative: 0.7 % (ref 0.0–3.0)
Eosinophils Absolute: 0.1 10*3/uL (ref 0.0–0.7)
Eosinophils Relative: 1.9 % (ref 0.0–5.0)
HCT: 39.8 % (ref 36.0–46.0)
Hemoglobin: 13.3 g/dL (ref 12.0–15.0)
Lymphocytes Relative: 14.3 % (ref 12.0–46.0)
Lymphs Abs: 0.8 10*3/uL (ref 0.7–4.0)
MCHC: 33.4 g/dL (ref 30.0–36.0)
MCV: 92.2 fl (ref 78.0–100.0)
Monocytes Absolute: 0.6 10*3/uL (ref 0.1–1.0)
Monocytes Relative: 9.5 % (ref 3.0–12.0)
Neutro Abs: 4.3 10*3/uL (ref 1.4–7.7)
Neutrophils Relative %: 73.6 % (ref 43.0–77.0)
Platelets: 387 10*3/uL (ref 150.0–400.0)
RBC: 4.31 Mil/uL (ref 3.87–5.11)
RDW: 13.6 % (ref 11.5–15.5)
WBC: 5.8 10*3/uL (ref 4.0–10.5)

## 2019-12-22 LAB — HEPATIC FUNCTION PANEL
ALT: 29 U/L (ref 0–35)
AST: 24 U/L (ref 0–37)
Albumin: 4.6 g/dL (ref 3.5–5.2)
Alkaline Phosphatase: 64 U/L (ref 39–117)
Bilirubin, Direct: 0 mg/dL (ref 0.0–0.3)
Total Bilirubin: 0.4 mg/dL (ref 0.2–1.2)
Total Protein: 7.5 g/dL (ref 6.0–8.3)

## 2019-12-22 LAB — LIPASE: Lipase: 38 U/L (ref 11.0–59.0)

## 2019-12-22 LAB — LIPID PANEL
Cholesterol: 327 mg/dL — ABNORMAL HIGH (ref 0–200)
HDL: 49.9 mg/dL (ref >39.00)
NonHDL: 277.26
Total CHOL/HDL Ratio: 7
Triglycerides: 218 mg/dL — ABNORMAL HIGH (ref 0.0–149.0)
VLDL: 43.6 mg/dL — ABNORMAL HIGH (ref 0.0–40.0)

## 2019-12-22 LAB — AMYLASE: Amylase: 24 U/L — ABNORMAL LOW (ref 27–131)

## 2019-12-22 LAB — TSH: TSH: 0.67 u[IU]/mL (ref 0.35–4.50)

## 2019-12-22 LAB — LDL CHOLESTEROL, DIRECT: Direct LDL: 236 mg/dL

## 2019-12-22 NOTE — Patient Instructions (Signed)
Schedule your complete physical in 6 months We'll notify you of your lab results and make any changes if needed If you need any help with your alcohol abuse, please let me know We'll call you with your GI referral Try and limit ibuprofen and alcohol at this time Continue to work on healthy diet and regular exercise- you're doing great! Call with any questions or concerns Stay Safe!  Stay Healthy! Happy Holidays!!

## 2019-12-22 NOTE — Assessment & Plan Note (Signed)
Deteriorated.  Pt reports she is an alcoholic and is currently 'in the drinking phase'.  She states she will take Naltrexone periodically and during those times she will be alcohol free and then she will stop her meds and drink again.  She is working with her psychiatrist at the [facility] and her therapist.  Given her use of alcohol, she is at higher risk for GI bleeds and other abnormalities.  Refer to GI for complete evaluation.

## 2019-12-22 NOTE — Progress Notes (Signed)
   Subjective:    Patient ID: Charlene [name], female    DOB: 09-09-1980, 39 y.o.   MRN: [id number]  HPI Rectal bleeding- 'I've had hemorrhoids since I had kids'.  Reports she is 'an alcoholic, I drink way too much, and I take too much ibuprofen'.  Blood is bright red.  Occurs intermittently.  'it's a lot'- occurs more when I drink more.  No pain, itching, burning.  Can occur independently of BM's- like when passing gas.  No blood in underwear.  sxs started ~1 yr ago.  Bleeding is painless.  Not having bleeding at this time.  Denies SOB.  Rare R sided CP.  No dizziness.  Obesity- down 20 lbs since 2020.  Pt is now attempting a keto diet.   Review of Systems For ROS see HPI   This visit occurred during the SARS-CoV-2 public health emergency.  Safety protocols were in place, including screening questions prior to the visit, additional usage of staff PPE, and extensive cleaning of exam room while observing appropriate contact time as indicated for disinfecting solutions.       Objective:   Physical Exam Vitals reviewed.  Constitutional:      General: She is not in acute distress.    Appearance: Normal appearance. She is obese. She is not ill-appearing.  HENT:     Head: Normocephalic.  Eyes:     Extraocular Movements: Extraocular movements intact.     Conjunctiva/sclera: Conjunctivae normal.     Pupils: Pupils are equal, round, and reactive to light.  Cardiovascular:     Rate and Rhythm: Normal rate and regular rhythm.     Pulses: Normal pulses.     Heart sounds: Normal heart sounds. No murmur heard.   Pulmonary:     Effort: Pulmonary effort is normal. No respiratory distress.     Breath sounds: Normal breath sounds. No wheezing or rhonchi.  Abdominal:     General: There is no distension.     Palpations: Abdomen is soft.     Tenderness: There is no abdominal tenderness.  Musculoskeletal:     Right lower leg: No edema.     Left lower leg: No edema.  Skin:    General: Skin is warm  and dry.  Neurological:     General: No focal deficit present.     Mental Status: She is alert and oriented to person, place, and time.  Psychiatric:        Mood and Affect: Mood normal.        Behavior: Behavior normal.           Assessment & Plan:  BRBPR- new.  Pt reports this has been going on for probably the last year.  She know she has a history of hemorrhoids but also knows she is an alcoholic and takes 'a ton of ibuprofen'- placing her at increased risk for bleeding.  Even if internal hemorrhoids were present today on exam, I would not feel comfortable assuming that was the cause of bleeding given the amount described.  Will refer to GI for complete evaluation and tx.  Pt expressed understanding and is in agreement w/ plan.

## 2019-12-22 NOTE — Assessment & Plan Note (Signed)
Pt is down 20 lbs since last visit.  She states she is trying to follow a keto diet.  Applauded her efforts.  Check labs to  Risk stratify.

## 2019-12-23 ENCOUNTER — Ambulatory Visit: Payer: 59 | Admitting: Family Medicine

## 2019-12-24 ENCOUNTER — Other Ambulatory Visit: Payer: Self-pay

## 2019-12-24 DIAGNOSIS — R748 Abnormal levels of other serum enzymes: Secondary | ICD-10-CM

## 2019-12-24 MED ORDER — ROSUVASTATIN CALCIUM 10 MG PO TABS: 10.0000 mg | ORAL_TABLET | Freq: Every day | ORAL | 0 refills | Status: DC

## 2020-02-22 ENCOUNTER — Encounter: Payer: Self-pay | Admitting: *Deleted

## 2020-02-23 ENCOUNTER — Ambulatory Visit (INDEPENDENT_AMBULATORY_CARE_PROVIDER_SITE_OTHER): Payer: 59 | Admitting: Gastroenterology

## 2020-02-23 ENCOUNTER — Encounter: Payer: Self-pay | Admitting: Gastroenterology

## 2020-02-23 ENCOUNTER — Other Ambulatory Visit: Payer: Self-pay

## 2020-02-23 ENCOUNTER — Ambulatory Visit: Payer: 59 | Admitting: Gastroenterology

## 2020-02-23 VITALS — BP 104/74 | HR 90 | Ht 66.5 in | Wt 225.0 lb

## 2020-02-23 DIAGNOSIS — K625 Hemorrhage of anus and rectum: Secondary | ICD-10-CM | POA: Diagnosis not present

## 2020-02-23 MED ORDER — ONDANSETRON HCL 4 MG PO TABS: 4.0000 mg | ORAL_TABLET | Freq: Three times a day (TID) | ORAL | 0 refills | Status: DC | PRN

## 2020-02-23 MED ORDER — HYDROCORTISONE ACETATE 25 MG RE SUPP: 25.0000 mg | Freq: Every day | RECTAL | 1 refills | Status: DC

## 2020-02-23 MED ORDER — SUTAB 1479-225-188 MG PO TABS: 1.0000 | ORAL_TABLET | Freq: Once | ORAL | 0 refills | Status: AC

## 2020-02-23 NOTE — Progress Notes (Signed)
Charlene [name]    [id number]    08-25-1980  Primary Care Physician:Tabori, Helane Rima, MD  Referring Physician: [name], MD [address] [place],  [place] [address]   Chief complaint:  Rectal bleeding  HPI: 40 yr very pleasant female here for new patient visit for evaluation of rectal bleeding.  She has been having on and off rectal bleeding for past 1 year, she was having bleeding almost daily during summer, it has since improved and currently she is having on average every few days to once a week.  It small-volume bright red blood per rectum in the toilet bowl and also when she wipes. No family history of GI malignancy or IBD. Denies any abdominal pain, vomiting, melena, unintentional weight loss or decreased appetite. She uses NSAIDs intermittently.  Outpatient Encounter Medications as of 02/23/2020  Medication Sig  . acetaminophen (TYLENOL) 500 MG tablet Take 1,000-1,500 mg by mouth daily as needed for moderate pain or headache.  . amphetamine-dextroamphetamine (ADDERALL) 20 MG tablet Take 20 mg by mouth daily.  Marland Kitchen Apremilast (OTEZLA) 30 MG TABS Take 1 tablet by mouth in the morning and at bedtime.  . Biotin 94765 MCG TABS Take by mouth daily.  Marland Kitchen LATUDA 40 MG TABS tablet Take 1 tablet daily  . Multiple Vitamin (MULTIVITAMIN) tablet Take 1 tablet by mouth daily.  . rosuvastatin (CRESTOR) 10 MG tablet Take 1 tablet (10 mg total) by mouth daily.  . SUMAtriptan (IMITREX) 5 MG/ACT nasal spray Place 1 spray into the nose as needed for migraine.  . vortioxetine HBr (TRINTELLIX) 20 MG TABS tablet Take 20 mg by mouth daily.   No facility-administered encounter medications on file as of 02/23/2020.    Allergies as of 02/23/2020 - Review Complete 02/23/2020  Allergen Reaction Noted  . Ciprofloxacin  02/11/2016  . Prednisone  02/11/2016    Past Medical History:  Diagnosis Date  . Alcoholism (HCC)   . Anxiety and depression   . Claustrophobia   .  Depression   . Elevated blood sugar 06/08/2014  . Elevated LFTs   . Fatty liver 10/2014  . GERD (gastroesophageal reflux disease)   . Heart murmur   . Hyperlipidemia   . Infertility associated with anovulation   . 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 Migraines   . MS (multiple sclerosis) (HCC)   . Obesity in pregnancy, antepartum   . Personal history of pre-term labor   . Pregnancy induced hypertension   . Psoriasis     Past Surgical History:  Procedure Laterality Date  . DILATION AND CURETTAGE OF UTERUS  8/10  . DILATION AND EVACUATION  12/29/2011   Procedure: DILATATION AND EVACUATION;  Surgeon: Catalina Antigua, MD;  Location: [facility];  Service: Gynecology;;  Dr, Emelda Fear transferred case to Dr. Jolayne Panther  . LAPAROSCOPIC GASTRIC SLEEVE RESECTION N/A 12/07/2014   Procedure: LAPAROSCOPIC GASTRIC SLEEVE RESECTION;  Surgeon: Ovidio Kin, MD;  Location: [facility];  Service: General;  Laterality: N/A;    Family History  Problem Relation Age of Onset  . Heart attack Maternal Grandfather   . Alcoholism Maternal Grandmother   . Mental illness Maternal Grandmother   . Esophageal cancer Maternal Grandmother   . Alcoholism Father   . Hyperlipidemia Mother   . Mental illness Mother   . Dementia Mother        early onset  . Colon cancer Neg Hx   .  Stomach cancer Neg Hx     Social History   Socioeconomic History  . Marital status: Married    Spouse name: Not on file  . Number of children: Not on file  . Years of education: Not on file  . Highest education level: Not on file  Occupational History  . Occupation: Print production planner  Tobacco Use  . Smoking status: Former Smoker    Packs/day: 1.00    Years: 15.00    Pack years: 15.00    Types: Cigarettes    Quit date: 07/02/2010    Years since quitting: 9.6  . Smokeless tobacco: Never Used  Vaping Use  . Vaping Use: Never used  Substance and Sexual Activity  . Alcohol use: Yes    Comment:  alcoholic - so she either does not drink at all or she over drinks  . Drug use: No  . Sexual activity: Yes    Partners: Male  Other Topics Concern  . Not on file  Social History Narrative   Work or School: homemaker      Home Situation: lives with husband, 2 twins (2.92 yo) and 68 month old in 2016      Spiritual Beliefs: none      Lifestyle: no regular exercise, poor diet      Social Determinants of Health   Financial Resource Strain: Not on file  Food Insecurity: Not on file  Transportation Needs: Not on file  Physical Activity: Not on file  Stress: Not on file  Social Connections: Not on file  Intimate Partner Violence: Not on file      Review of systems: All other review of systems negative except as mentioned in the HPI.   Physical Exam: Vitals:   02/23/20 1009  BP: 104/74  Pulse: 90  SpO2: 98%   Body mass index is 35.77 kg/m. Gen:      No acute distress HEENT:  sclera anicteric Abd:      soft, non-tender; no palpable masses, no distension Ext:    No edema Neuro: alert and oriented x 3 Psych: normal mood and affect Rectal exam deferred  Data Reviewed:  Reviewed labs, radiology imaging, old records and pertinent past GI work up   Assessment and Plan/Recommendations:  40 year old very pleasant female for evaluation of rectal bleeding Most likely etiology of rectal bleeding is small-volume hemorrhage from internal hemorrhoids but cannot exclude ulcerative proctitis, stercoral ulcer, bleeding polyp or malignancy We will plan to proceed with colonoscopy for evaluation  Advised patient to use Anusol suppository per rectum daily at bedtime for 7 days, followed by as needed Increase dietary fiber and water intake Avoid excessive straining during defecation  Return after colonoscopy based on findings, will consider hemorrhoidal band ligation if needed  The risks and benefits as well as alternatives of endoscopic procedure(s) have been discussed and  reviewed. All questions answered. The patient agrees to proceed.   The patient was provided an opportunity to ask questions and all were answered. The patient agreed with the plan and demonstrated an understanding of the instructions.  Iona Beard , MD    CC: [name], MD

## 2020-02-23 NOTE — Patient Instructions (Addendum)
If you are age 40 or older, your body mass index should be between 23-30. Your Body mass index is 35.77 kg/m. If this is out of the aforementioned range listed, please consider follow up with your Primary Care Provider.  If you are age 53 or younger, your body mass index should be between 19-25. Your Body mass index is 35.77 kg/m. If this is out of the aformentioned range listed, please consider follow up with your Primary Care Provider.   We have sent the following medications to your pharmacy for you to pick up at your convenience: Anusol [facility] Suppositories   You have been scheduled for a colonoscopy. Please follow written instructions given to you at your visit today.  Please pick up your prep supplies at the pharmacy within the next 1-3 days. If you use inhalers (even only as needed), please bring them with you on the day of your procedure.

## 2020-02-24 ENCOUNTER — Encounter: Payer: Self-pay | Admitting: Gastroenterology

## 2020-03-01 ENCOUNTER — Telehealth (INDEPENDENT_AMBULATORY_CARE_PROVIDER_SITE_OTHER): Payer: 59 | Admitting: Family Medicine

## 2020-03-01 ENCOUNTER — Encounter: Payer: Self-pay | Admitting: Family Medicine

## 2020-03-01 DIAGNOSIS — R110 Nausea: Secondary | ICD-10-CM

## 2020-03-01 DIAGNOSIS — R519 Headache, unspecified: Secondary | ICD-10-CM | POA: Diagnosis not present

## 2020-03-01 DIAGNOSIS — T753XXA Motion sickness, initial encounter: Secondary | ICD-10-CM | POA: Diagnosis not present

## 2020-03-01 MED ORDER — ONDANSETRON HCL 4 MG PO TABS: 4.0000 mg | ORAL_TABLET | Freq: Three times a day (TID) | ORAL | 0 refills | Status: DC | PRN

## 2020-03-01 MED ORDER — SCOPOLAMINE 1 MG/3DAYS TD PT72: 1.0000 | MEDICATED_PATCH | TRANSDERMAL | 0 refills | Status: DC

## 2020-03-01 NOTE — Progress Notes (Signed)
I connected with  Charlene [name] on 03/01/20 by a video enabled telemedicine application and verified that I am speaking with the correct person using two identifiers.   I discussed the limitations of evaluation and management by telemedicine. The patient expressed understanding and agreed to proceed.

## 2020-03-01 NOTE — Progress Notes (Signed)
Virtual Visit via Video   I connected with patient on 03/01/20 at  1:30 PM EST by a video enabled telemedicine application and verified that I am speaking with the correct person using two identifiers.  Location patient: Home Location provider: [facility] Persons participating in the virtual visit: Patient, Provider, CMA (Sabrina M)  I discussed the limitations of evaluation and management by telemedicine and the availability of in person appointments. The patient expressed understanding and agreed to proceed.  Subjective:   HPI:   HA- pt reports everyday for the last 2-3 weeks in the late afternoon she will develop either severe headache or nausea or both.  Pt reports she is able to take Excedrin Migraine to control the headaches but nausea persists.  She states this is different than her typical migraines.  Pt reports increased stress recently- large leak in the house, difficulty w/ contractors.  No relief w/ pepto- vomited.    Motion sickness- pt is concerned b/c she has a hx of motion sickness and they are leaving for a cruise this weekend.  ROS:   See pertinent positives and negatives per HPI.  Patient Active Problem List   Diagnosis Date Noted  . Alcohol abuse 05/01/2018  . Bipolar disorder (HCC) 06/08/2016  . Borderline personality disorder (HCC) 06/08/2016  . Obesity (BMI 30.0-34.9) 12/07/2014  . Hyperlipemia 06/08/2014  . Elevated blood sugar 06/08/2014  . Psoriasis 06/08/2014  . Migraines 06/08/2014  . Multiple sclerosis (HCC)   . Adjustment disorder with anxiety 01/05/2008    Social History   Tobacco Use  . Smoking status: Former Smoker    Packs/day: 1.00    Years: 15.00    Pack years: 15.00    Types: Cigarettes    Quit date: 07/02/2010    Years since quitting: 9.6  . Smokeless tobacco: Never Used  Substance Use Topics  . Alcohol use: Yes    Comment: alcoholic - so she either does not drink at all or she over drinks    Current  Outpatient Medications:  .  amphetamine-dextroamphetamine (ADDERALL) 20 MG tablet, Take 20 mg by mouth daily., Disp: , Rfl:  .  Apremilast (OTEZLA) 30 MG TABS, Take 1 tablet by mouth in the morning and at bedtime., Disp: , Rfl:  .  Biotin 16073 MCG TABS, Take by mouth daily., Disp: , Rfl:  .  hydrocortisone (ANUSOL-HC) 25 MG suppository, Place 1 suppository (25 mg total) rectally at bedtime., Disp: 24 suppository, Rfl: 1 .  LATUDA 40 MG TABS tablet, Take 1 tablet daily, Disp: , Rfl:  .  Multiple Vitamin (MULTIVITAMIN) tablet, Take 1 tablet by mouth daily., Disp: , Rfl:  .  rosuvastatin (CRESTOR) 10 MG tablet, Take 1 tablet (10 mg total) by mouth daily., Disp: 90 tablet, Rfl: 0 .  SUMAtriptan (IMITREX) 5 MG/ACT nasal spray, Place 1 spray into the nose as needed for migraine., Disp: , Rfl:  .  vortioxetine HBr (TRINTELLIX) 20 MG TABS tablet, Take 20 mg by mouth daily., Disp: , Rfl:  .  acetaminophen (TYLENOL) 500 MG tablet, Take 1,000-1,500 mg by mouth daily as needed for moderate pain or headache. (Patient not taking: Reported on 03/01/2020), Disp: , Rfl:  .  ondansetron (ZOFRAN) 4 MG tablet, Take 1 tablet (4 mg total) by mouth every 8 (eight) hours as needed for nausea or vomiting. (Patient not taking: Reported on 03/01/2020), Disp: 5 tablet, Rfl: 0  Allergies  Allergen Reactions  . Ciprofloxacin     Other reaction(s): Mental Status  Changes  Manic episode    . Prednisone     Other reaction(s):  Puts her in manic episode. She is Bipolar      Objective:   LMP 02/07/2020 (Exact Date)   AAOx3, NAD NCAT, EOMI No obvious CN deficits Coloring WNL Pt is able to speak clearly, coherently without shortness of breath or increased work of breathing.  Thought process is linear.  Mood is appropriate.   Assessment and Plan:   Headache- new.  Pt has hx of migraines but reports this is different than her typical migraine HA.  Reports this occurs late in the afternoon, not as severe, and improves  w/ excedrin migraine or ibuprofen.  Has neurology appt/scan upcoming.  Discussed stress as possible cause of headache and pt reports this is a strong possibility.  Encouraged stress management and close follow up.  Pt expressed understanding and is in agreement w/ plan.   Nausea- new.  Pt reports this will occur independently of HAs and last longer.  Will send Zofran to use prn.  She has appt w/ GI upcoming.  Motion sickness- new.  Pt has hx of car sickness and she got sick on a prior cruise.  Since she is set to leave this weekend on a birthday cruise, will send in Scopolamine for her to wear while at sea.  Pt expressed understanding and is in agreement w/ plan.    Neena Rhymes, MD 03/01/2020

## 2020-03-23 ENCOUNTER — Other Ambulatory Visit: Payer: Self-pay | Admitting: Neurology

## 2020-03-23 DIAGNOSIS — G35 Multiple sclerosis: Secondary | ICD-10-CM

## 2020-04-05 ENCOUNTER — Encounter: Payer: 59 | Admitting: Gastroenterology

## 2020-04-07 ENCOUNTER — Telehealth: Payer: Self-pay | Admitting: Family Medicine

## 2020-04-07 MED ORDER — ROSUVASTATIN CALCIUM 10 MG PO TABS: 10.0000 mg | ORAL_TABLET | Freq: Every day | ORAL | 0 refills | Status: DC

## 2020-04-07 NOTE — Addendum Note (Signed)
Addended by: Jimmye Norman on: 04/07/2020 01:37 PM   Modules accepted: Orders

## 2020-04-07 NOTE — Telephone Encounter (Signed)
Rx Rosuvastatin sent to pharmacy.

## 2020-04-07 NOTE — Telephone Encounter (Signed)
Can we send this medication in for the pt she is out of the Rosuvastatin and was told in Dec. To f/up in . She is scheduled for 06/16/20   Please advise pt uses [facility] in [place]

## 2020-04-16 ENCOUNTER — Other Ambulatory Visit: Payer: Self-pay

## 2020-04-16 ENCOUNTER — Ambulatory Visit
Admission: RE | Admit: 2020-04-16 | Discharge: 2020-04-16 | Disposition: A | Discharge status: Home / Self Care | Payer: 59 | Source: Ambulatory Visit | Attending: Neurology | Admitting: Neurology

## 2020-04-16 ENCOUNTER — Other Ambulatory Visit: Payer: 59

## 2020-04-16 DIAGNOSIS — G35 Multiple sclerosis: Secondary | ICD-10-CM

## 2020-04-16 MED ORDER — GADOBENATE DIMEGLUMINE 529 MG/ML IV SOLN
20.0000 mL | Freq: Once | INTRAVENOUS | Status: AC | PRN
  Administered 2020-04-16: 20 mL via INTRAVENOUS

## 2020-05-13 ENCOUNTER — Encounter: Payer: 59 | Admitting: Gastroenterology

## 2020-05-30 ENCOUNTER — Telehealth: Payer: Self-pay | Admitting: Gastroenterology

## 2020-05-30 NOTE — Telephone Encounter (Signed)
ok 

## 2020-05-30 NOTE — Telephone Encounter (Signed)
Good morning,   Inbound call from patient. Cancel procedure for tomoorw 5/17. Positive at home COVID test 5/15. Rescheduled for 7/25.

## 2020-05-31 ENCOUNTER — Encounter: Payer: 59 | Admitting: Gastroenterology

## 2020-06-16 ENCOUNTER — Other Ambulatory Visit: Payer: Self-pay

## 2020-06-16 ENCOUNTER — Encounter: Payer: Self-pay | Admitting: Family Medicine

## 2020-06-16 ENCOUNTER — Ambulatory Visit (INDEPENDENT_AMBULATORY_CARE_PROVIDER_SITE_OTHER): Payer: 59 | Admitting: Family Medicine

## 2020-06-16 VITALS — BP 110/80 | HR 97 | Temp 97.6°F | Ht 66.5 in | Wt 233.0 lb

## 2020-06-16 DIAGNOSIS — E669 Obesity, unspecified: Secondary | ICD-10-CM | POA: Diagnosis not present

## 2020-06-16 DIAGNOSIS — F319 Bipolar disorder, unspecified: Secondary | ICD-10-CM | POA: Diagnosis not present

## 2020-06-16 DIAGNOSIS — Z0000 Encounter for general adult medical examination without abnormal findings: Secondary | ICD-10-CM | POA: Diagnosis not present

## 2020-06-16 MED ORDER — PHENTERMINE HCL 37.5 MG PO CAPS: 37.5000 mg | ORAL_CAPSULE | ORAL | 0 refills | Status: DC

## 2020-06-16 NOTE — Assessment & Plan Note (Signed)
PE WNL w/ exception of obesity.  Due for COVID booster- pt plans to get.  Due for pap and mammo- appt scheduled w/ GYN.  Check labs.  Anticipatory guidance provided.

## 2020-06-16 NOTE — Assessment & Plan Note (Addendum)
Deteriorated.  BMI is now 37.04.  She is starting [facility] on Monday and plans to 'finish up' her alcohol this week and restart her Naltrexone.  Encouraged healthy diet and regular exercise.  Discussed that alcohol is empty calories.  She is interested in starting phentermine to coincide w/ starting WW to improve her initial weight loss and get her closer to goal.  Prescription sent.  Will follow.

## 2020-06-16 NOTE — Assessment & Plan Note (Signed)
Chronic problem, following w/ Rosemarie Beath but plans to transfer to [facility]

## 2020-06-16 NOTE — Progress Notes (Signed)
   Subjective:    Patient ID: Charlene [name], female    DOB: February 23, 1980, 40 y.o.   MRN: [id number]  HPI CPE- UTD on COVID vaccines plans to schedule booster), Tdap, appt scheduled w/ GYN for pap and mammo  Reviewed past medical, surgical, family and social histories.   Patient Care Team    Relationship Specialty Notifications Start End  [name], MD PCP - General Family Medicine  06/08/16   Harlen Labs, MD Referring Physician Neurology  06/08/16   Lesly Dukes, MD Consulting Physician Obstetrics and Gynecology  06/08/16   Sarita Bottom, MD Referring Physician Neurology  06/08/16   Emelda Fear, MD Referring Physician Dermatology  05/01/18     Health Maintenance  Topic Date Due  . COVID-19 Vaccine (3 - Booster for Pfizer series) 09/20/2019  . PAP SMEAR-Modifier  08/31/2020 (Originally 02/20/2016)  . Hepatitis C Screening  03/01/2021 (Originally 03/05/1998)  . INFLUENZA VACCINE  08/15/2020  . TETANUS/TDAP  11/04/2023  . Zoster Vaccines- Shingrix (1 of 2) 03/05/2030  . HIV Screening  Completed  . HPV VACCINES  Aged Out      Review of Systems Patient reports no vision/hearing changes, adenopathy, fever, weight change,  persistant/recurrent hoarseness , swallowing issues, chest pain, palpitations, edema, persistant/recurrent cough, hemoptysis, dyspnea (rest/exertional/paroxysmal nocturnal), abdominal pain, significant heartburn, bowel changes, GU symptoms (dysuria, hematuria, incontinence), Gyn symptoms (abnormal  bleeding, pain),  syncope, focal weakness, memory loss, skin/hair changes, abnormal bruising, anxiety, or depression.  + numbness w/ MS  + brittle nails + rectal bleeding- colonoscopy scheduled for 7/25  This visit occurred during the SARS-CoV-2 public health emergency.  Safety protocols were in place, including screening questions prior to the visit, additional usage of staff PPE, and extensive cleaning of exam room while observing appropriate contact time as  indicated for disinfecting solutions.       Objective:   Physical Exam General Appearance:    Alert, cooperative, no distress, appears stated age, obese  Head:    Normocephalic, without obvious abnormality, atraumatic  Eyes:    PERRL, conjunctiva/corneas clear, EOM's intact, fundi    benign, both eyes  Ears:    Normal TM's and external ear canals, both ears  Nose:   Deferred due to COVID  Throat:   Neck:   Supple, symmetrical, trachea midline, no adenopathy;    Thyroid: no enlargement/tenderness/nodules  Back:     Symmetric, no curvature, ROM normal, no CVA tenderness  Lungs:     Clear to auscultation bilaterally, respirations unlabored  Chest Wall:    No tenderness or deformity   Heart:    Regular rate and rhythm, S1 and S2 normal, no murmur, rub   or gallop  Breast Exam:    Deferred to GYN  Abdomen:     Soft, non-tender, bowel sounds active all four quadrants,    no masses, no organomegaly  Genitalia:    Deferred to GYN  Rectal:    Extremities:   Extremities normal, atraumatic, no cyanosis or edema  Pulses:   2+ and symmetric all extremities  Skin:   Skin color, texture, turgor normal, scattered psoriasis plaques  Lymph nodes:   Cervical, supraclavicular, and axillary nodes normal  Neurologic:   CNII-XII intact, normal strength, sensation and reflexes    throughout          Assessment & Plan:

## 2020-06-16 NOTE — Patient Instructions (Signed)
Follow up in 6 months to recheck weight loss progress We'll notify you of your lab results and make any changes if needed Continue to work on healthy diet and regular exercise- you can do it!! Please have GYN send me a copy of their reports START the Phentermine once daily STOP the Adderall Call with any questions or concerns Have a great summer!!!

## 2020-06-17 ENCOUNTER — Telehealth: Payer: Self-pay

## 2020-06-17 ENCOUNTER — Other Ambulatory Visit: Payer: 59

## 2020-06-17 LAB — CBC WITH DIFFERENTIAL/PLATELET
Basophils Absolute: 0 10*3/uL (ref 0.0–0.1)
Basophils Relative: 0.6 % (ref 0.0–3.0)
Eosinophils Absolute: 0 10*3/uL (ref 0.0–0.7)
Eosinophils Relative: 0.5 % (ref 0.0–5.0)
HCT: 34.5 % — ABNORMAL LOW (ref 36.0–46.0)
Hemoglobin: 12 g/dL (ref 12.0–15.0)
Lymphocytes Relative: 11.6 % — ABNORMAL LOW (ref 12.0–46.0)
Lymphs Abs: 1 10*3/uL (ref 0.7–4.0)
MCHC: 34.9 g/dL (ref 30.0–36.0)
MCV: 96.2 fl (ref 78.0–100.0)
Monocytes Absolute: 0.5 10*3/uL (ref 0.1–1.0)
Monocytes Relative: 6.4 % (ref 3.0–12.0)
Neutro Abs: 6.9 10*3/uL (ref 1.4–7.7)
Neutrophils Relative %: 80.9 % — ABNORMAL HIGH (ref 43.0–77.0)
Platelets: 315 10*3/uL (ref 150.0–400.0)
RBC: 3.58 Mil/uL — ABNORMAL LOW (ref 3.87–5.11)
RDW: 14 % (ref 11.5–15.5)
WBC: 8.6 10*3/uL (ref 4.0–10.5)

## 2020-06-17 LAB — VITAMIN D 25 HYDROXY (VIT D DEFICIENCY, FRACTURES): VITD: 19.2 ng/mL — ABNORMAL LOW (ref 30.00–100.00)

## 2020-06-17 LAB — TSH: TSH: 0.46 u[IU]/mL (ref 0.35–4.50)

## 2020-06-17 NOTE — Telephone Encounter (Signed)
Processed a PA for patient for Phentermine 37.5mg  caps with [facility]. Waiting on approval or denial.

## 2020-06-20 ENCOUNTER — Other Ambulatory Visit: Payer: Self-pay

## 2020-06-20 DIAGNOSIS — E559 Vitamin D deficiency, unspecified: Secondary | ICD-10-CM

## 2020-06-20 LAB — BASIC METABOLIC PANEL
BUN: 10 mg/dL (ref 6–23)
CO2: 24 mEq/L (ref 19–32)
Calcium: 9.4 mg/dL (ref 8.4–10.5)
Chloride: 102 mEq/L (ref 96–112)
Creatinine, Ser: 0.64 mg/dL (ref 0.40–1.20)
GFR: 110.64 mL/min (ref >60.00)
Glucose, Bld: 93 mg/dL (ref 70–99)
Potassium: 4.5 mEq/L (ref 3.5–5.1)
Sodium: 139 mEq/L (ref 135–145)

## 2020-06-20 LAB — LIPID PANEL
Cholesterol: 198 mg/dL (ref 0–200)
HDL: 67.6 mg/dL (ref >39.00)
LDL Cholesterol: 103 mg/dL — ABNORMAL HIGH (ref 0–99)
NonHDL: 130.42
Total CHOL/HDL Ratio: 3
Triglycerides: 138 mg/dL (ref 0.0–149.0)
VLDL: 27.6 mg/dL (ref 0.0–40.0)

## 2020-06-20 LAB — HEPATIC FUNCTION PANEL
ALT: 29 U/L (ref 0–35)
AST: 34 U/L (ref 0–37)
Albumin: 4.4 g/dL (ref 3.5–5.2)
Alkaline Phosphatase: 68 U/L (ref 39–117)
Bilirubin, Direct: 0.1 mg/dL (ref 0.0–0.3)
Total Bilirubin: 0.4 mg/dL (ref 0.2–1.2)
Total Protein: 7.1 g/dL (ref 6.0–8.3)

## 2020-06-20 MED ORDER — VITAMIN D (ERGOCALCIFEROL) 1.25 MG (50000 UNIT) PO CAPS: 50000.0000 [IU] | ORAL_CAPSULE | ORAL | 0 refills | Status: DC

## 2020-07-01 ENCOUNTER — Other Ambulatory Visit: Payer: Self-pay | Admitting: Family Medicine

## 2020-07-13 ENCOUNTER — Encounter: Payer: Self-pay | Admitting: *Deleted

## 2020-08-08 ENCOUNTER — Encounter: Payer: 59 | Admitting: Gastroenterology

## 2020-09-15 ENCOUNTER — Ambulatory Visit (INDEPENDENT_AMBULATORY_CARE_PROVIDER_SITE_OTHER): Payer: 59 | Admitting: Obstetrics & Gynecology

## 2020-09-15 ENCOUNTER — Other Ambulatory Visit: Payer: Self-pay

## 2020-09-15 ENCOUNTER — Encounter (HOSPITAL_BASED_OUTPATIENT_CLINIC_OR_DEPARTMENT_OTHER): Payer: Self-pay | Admitting: Obstetrics & Gynecology

## 2020-09-15 ENCOUNTER — Other Ambulatory Visit (HOSPITAL_COMMUNITY)
Admission: RE | Admit: 2020-09-15 | Discharge: 2020-09-15 | Disposition: A | Discharge status: Home / Self Care | Payer: 59 | Source: Ambulatory Visit | Attending: Obstetrics & Gynecology | Admitting: Obstetrics & Gynecology

## 2020-09-15 VITALS — BP 122/84 | HR 99 | Ht 66.0 in | Wt 244.8 lb

## 2020-09-15 DIAGNOSIS — Z124 Encounter for screening for malignant neoplasm of cervix: Secondary | ICD-10-CM | POA: Diagnosis not present

## 2020-09-15 DIAGNOSIS — Z01419 Encounter for gynecological examination (general) (routine) without abnormal findings: Secondary | ICD-10-CM

## 2020-09-15 DIAGNOSIS — L719 Rosacea, unspecified: Secondary | ICD-10-CM | POA: Insufficient documentation

## 2020-09-15 DIAGNOSIS — R6882 Decreased libido: Secondary | ICD-10-CM | POA: Diagnosis not present

## 2020-09-15 DIAGNOSIS — Z1231 Encounter for screening mammogram for malignant neoplasm of breast: Secondary | ICD-10-CM

## 2020-09-15 NOTE — Progress Notes (Deleted)
40 y.o. [id number] Married White or Caucasian female here for annual exam.    No LMP recorded.          Sexually active: {yes no:314532}  The current method of family planning is {contraception:315051}.    Exercising: {yes no:314532}  {types:19826} Smoker:  {YES NO:22349}  Health Maintenance: Pap:  02/19/13 History of abnormal Pap:  {YES NO:22349} MMG:  *** Colonoscopy:  *** BMD:   *** TDaP:  11/03/13 Pneumonia vaccine(s):  *** Shingrix:   *** Hep C testing: *** Screening Labs: ***   reports that she quit smoking about 10 years ago. Her smoking use included cigarettes. She has a 15.00 pack-year smoking history. She has never used smokeless tobacco. She reports current alcohol use. She reports that she does not use drugs.  Past Medical History:  Diagnosis Date   Alcoholism (HCC)    Anxiety and depression    Claustrophobia    Depression    Elevated blood sugar 06/08/2014   Elevated LFTs    Fatty liver 10/2014   GERD (gastroesophageal reflux disease)    Heart murmur    Hyperlipidemia    Infertility associated with anovulation    Low grade squamous intraepithelial lesion (LGSIL) on Pap smear 10/30/2011   Migraines    MS (multiple sclerosis) (HCC)    Obesity in pregnancy, antepartum    Personal history of pre-term labor    Pregnancy induced hypertension    Psoriasis     Past Surgical History:  Procedure Laterality Date   DILATION AND CURETTAGE OF UTERUS  8/10   DILATION AND EVACUATION  12/29/2011   Procedure: DILATATION AND EVACUATION;  Surgeon: Catalina Antigua, MD;  Location: [facility];  Service: Gynecology;;  Dr, Emelda Fear transferred case to Dr. Jolayne Panther   LAPAROSCOPIC GASTRIC SLEEVE RESECTION N/A 12/07/2014   Procedure: LAPAROSCOPIC GASTRIC SLEEVE RESECTION;  Surgeon: Ovidio Kin, MD;  Location: [facility];  Service: General;  Laterality: N/A;    Current Outpatient Medications  Medication Sig Dispense Refill   acetaminophen (TYLENOL) 500 MG tablet Take 1,000-1,500 mg by mouth  daily as needed for moderate pain or headache.     ALPRAZolam (XANAX) 0.5 MG tablet Take 0.5 mg by mouth daily as needed.     Apremilast (OTEZLA) 30 MG TABS Take 1 tablet by mouth in the morning and at bedtime.     Biotin 73419 MCG TABS Take by mouth daily.     hydrocortisone (ANUSOL-HC) 25 MG suppository Place 1 suppository (25 mg total) rectally at bedtime. 24 suppository 1   LATUDA 40 MG TABS tablet Take 1 tablet daily     Multiple Vitamin (MULTIVITAMIN) tablet Take 1 tablet by mouth daily.     naltrexone (DEPADE) 50 MG tablet Take 50 mg by mouth daily.     ocrelizumab (OCREVUS) 300 MG/10ML injection See admin instructions.     ondansetron (ZOFRAN) 4 MG tablet Take 1 tablet (4 mg total) by mouth every 8 (eight) hours as needed for nausea or vomiting. 30 tablet 0   phentermine 37.5 MG capsule Take 1 capsule (37.5 mg total) by mouth every morning. 90 capsule 0   rosuvastatin (CRESTOR) 10 MG tablet TAKE 1 TABLET(10 MG) BY MOUTH DAILY 90 tablet 0   scopolamine (TRANSDERM-SCOP, 1.5 MG,) 1 MG/3DAYS Place 1 patch (1.5 mg total) onto the skin every 3 (three) days. 4 patch 0   SUMAtriptan (IMITREX) 5 MG/ACT nasal spray Place 1 spray into the nose as needed for migraine.     Vitamin D, Ergocalciferol, (DRISDOL)  1.25 MG (50000 UNIT) CAPS capsule Take 1 capsule (50,000 Units total) by mouth every 7 (seven) days. 12 capsule 0   vortioxetine HBr (TRINTELLIX) 20 MG TABS tablet Take 20 mg by mouth daily.     No current facility-administered medications for this visit.    Family History  Problem Relation Age of Onset   Heart attack Maternal Grandfather    Alcoholism Maternal Grandmother    Mental illness Maternal Grandmother    Esophageal cancer Maternal Grandmother    Alcoholism Father    Hyperlipidemia Mother    Mental illness Mother    Dementia Mother        early onset   Colon cancer Neg Hx    Stomach cancer Neg Hx     Review of Systems  Exam:   There were no vitals taken for this visit.      General appearance: alert, cooperative and appears stated age Head: Normocephalic, without obvious abnormality, atraumatic Neck: no adenopathy, supple, symmetrical, trachea midline and thyroid {EXAM; THYROID:18604} Lungs: clear to auscultation bilaterally Breasts: {Exam; breast:13139::"normal appearance, no masses or tenderness"} Heart: regular rate and rhythm Abdomen: soft, non-tender; bowel sounds normal; no masses,  no organomegaly Extremities: extremities normal, atraumatic, no cyanosis or edema Skin: Skin color, texture, turgor normal. No rashes or lesions Lymph nodes: Cervical, supraclavicular, and axillary nodes normal. No abnormal inguinal nodes palpated Neurologic: Grossly normal   Pelvic: External genitalia:  no lesions              Urethra:  normal appearing urethra with no masses, tenderness or lesions              Bartholins and Skenes: normal                 Vagina: normal appearing vagina with normal color and no discharge, no lesions              Cervix: {exam; cervix:14595}              Pap taken: {yes no:314532} Bimanual Exam:  Uterus:  {exam; uterus:12215}              Adnexa: {exam; adnexa:12223}               Rectovaginal: Confirms               Anus:  normal sphincter tone, no lesions  Chaperone, ***, CMA, was present for exam.  Assessment/Plan:

## 2020-09-15 NOTE — Progress Notes (Signed)
40 y.o. E3P2951 Married White or Caucasian female here for annual exam.  She is a new patient.  Cycles are regular.  Flow is heavy for one day but very manageable.  Does not feel she needs any treatment.  Husband had vasectomy but she states they did have fertility issues.  She does have decreased libido that she feels is interfering with marriage.  Can orgasm with clitoral stimulation.  Would like to know if there if any possible treatment.  Needs pap smear updated.  Last was more than 5 years ago.  Patient's last menstrual period was 09/04/2020 (exact date).          Sexually active: Yes.    The current method of family planning is vasectomy.    Exercising: No.   Smoker:  yes  Health Maintenance: Pap:  today MMG:  guidelines reviewed Colonoscopy:  scheduled for October due to rectal bleeding BMD:   not indicated TDaP:  2015 Hep C testing: discussed with pt.  Feels she's had done with dermatology Screening Labs: done with Dr. Beverely Low   reports that she quit smoking about 10 years ago. Her smoking use included cigarettes. She has a 15.00 pack-year smoking history. She has never used smokeless tobacco. She reports current alcohol use. She reports that she does not use drugs.  Past Medical History:  Diagnosis Date   Alcoholism (HCC)    Anxiety and depression    Bipolar 2 disorder (HCC)    Borderline personality disorder (HCC)    Claustrophobia    Depression    Elevated blood sugar 06/08/2014   Elevated LFTs    Fatty liver 10/2014   GERD (gastroesophageal reflux disease)    Heart murmur    Hyperlipidemia    Infertility associated with anovulation    Low grade squamous intraepithelial lesion (LGSIL) on Pap smear 10/30/2011   Migraines    MS (multiple sclerosis) (HCC)    Obesity in pregnancy, antepartum    Personal history of pre-term labor    Pregnancy induced hypertension    Psoriasis     Past Surgical History:  Procedure Laterality Date   DILATION AND CURETTAGE OF UTERUS   8/10   DILATION AND EVACUATION  12/29/2011   Procedure: DILATATION AND EVACUATION;  Surgeon: Catalina Antigua, MD;  Location: [facility];  Service: Gynecology;;  Dr, Emelda Fear transferred case to Dr. Jolayne Panther   LAPAROSCOPIC GASTRIC SLEEVE RESECTION N/A 12/07/2014   Procedure: LAPAROSCOPIC GASTRIC SLEEVE RESECTION;  Surgeon: Ovidio Kin, MD;  Location: [facility];  Service: General;  Laterality: N/A;    Current Outpatient Medications  Medication Sig Dispense Refill   acetaminophen (TYLENOL) 500 MG tablet Take 1,000-1,500 mg by mouth daily as needed for moderate pain or headache.     ALPRAZolam (XANAX) 0.5 MG tablet Take 0.5 mg by mouth daily as needed.     Biotin 88416 MCG TABS Take by mouth daily.     clobetasol (TEMOVATE) 0.05 % external solution Apply 1 application topically 2 (two) times daily.     LATUDA 40 MG TABS tablet Take 1 tablet daily     Multiple Vitamin (MULTIVITAMIN) tablet Take 1 tablet by mouth daily.     naltrexone (DEPADE) 50 MG tablet Take 50 mg by mouth daily.     ocrelizumab (OCREVUS) 300 MG/10ML injection See admin instructions.     ondansetron (ZOFRAN) 4 MG tablet Take 1 tablet (4 mg total) by mouth every 8 (eight) hours as needed for nausea or vomiting. 30 tablet 0   rosuvastatin (  CRESTOR) 10 MG tablet TAKE 1 TABLET(10 MG) BY MOUTH DAILY 90 tablet 0   SUMAtriptan (IMITREX) 5 MG/ACT nasal spray Place 1 spray into the nose as needed for migraine.     ustekinumab (STELARA) 90 MG/ML SOSY injection See admin instructions.     vortioxetine HBr (TRINTELLIX) 20 MG TABS tablet Take 20 mg by mouth daily.     zolmitriptan (ZOMIG) 5 MG tablet TAKE 1 TABLET BY MOUTH AS NEEDED FOR MIGRAINE     hydrocortisone (ANUSOL-HC) 25 MG suppository Place 1 suppository (25 mg total) rectally at bedtime. (Patient not taking: Reported on 09/15/2020) 24 suppository 1   No current facility-administered medications for this visit.    Family History  Problem Relation Age of Onset   Alcoholism Maternal  Grandmother    Mental illness Maternal Grandmother    Esophageal cancer Maternal Grandmother    Heart attack Maternal Grandfather    Alcoholism Father    Hyperlipidemia Mother    Mental illness Mother    Dementia Mother        early onset   Colon cancer Neg Hx    Stomach cancer Neg Hx     Review of Systems  All other systems reviewed and are negative.  Exam:   BP 122/84   Pulse 99   Ht 5\' 6"  (1.676 m)   Wt 244 lb 12.8 oz (111 kg)   LMP 09/04/2020 (Exact Date)   BMI 39.51 kg/m   Height: 5\' 6"  (167.6 cm)  General appearance: alert, cooperative and appears stated age Head: Normocephalic, without obvious abnormality, atraumatic Neck: no adenopathy, supple, symmetrical, trachea midline and thyroid normal to inspection and palpation Lungs: clear to auscultation bilaterally Breasts: normal appearance, no masses or tenderness Heart: regular rate and rhythm Abdomen: soft, non-tender; bowel sounds normal; no masses,  no organomegaly Extremities: extremities normal, atraumatic, no cyanosis or edema Skin: Skin color, texture, turgor normal. No rashes or lesions Lymph nodes: Cervical, supraclavicular, and axillary nodes normal. No abnormal inguinal nodes palpated Neurologic: Grossly normal   Pelvic: External genitalia:  erythematous patchy vulvar lesions c/w psoriasis              Urethra:  normal appearing urethra with no masses, tenderness or lesions              Bartholins and Skenes: normal                 Vagina: normal appearing vagina with normal color and no discharge, no lesions              Cervix: no lesions              Pap taken: Yes.   Bimanual Exam:  Uterus:  normal size, contour, position, consistency, mobility, non-tender              Adnexa: normal adnexa and no mass, fullness, tenderness               Rectovaginal: Confirms               Anus:  normal sphincter tone, no lesions  Chaperone, 09/06/2020, CMA, was present for exam.  Assessment/Plan: 1. Well  woman exam with routine gynecological exam - pap and HR HPV obtained - MMG screening guidelines reviewed.  Orders placed. - has colonoscopy scheduled.  Guidelines reviewed. - screening lab work done with Dr. - vaccines reviewed/updated  2. Decreased libido - Testosterone, Total, LC/MS/MS  3.  Starting immunosuppression treatment for psoriasis -  pt is going to monitor vulvar lesions to ensure they improve with treatment.  If not, I did recommend letting me know for possible biopsy. - also could not find Hep C testing in blood work.  Encouraged her to check with dermatologist about this.

## 2020-09-16 LAB — CYTOLOGY - PAP
Comment: NEGATIVE
Diagnosis: NEGATIVE
High risk HPV: NEGATIVE

## 2020-09-17 LAB — TESTOSTERONE, TOTAL, LC/MS/MS: Testosterone, total: 19.9 ng/dL

## 2020-09-20 ENCOUNTER — Ambulatory Visit (HOSPITAL_BASED_OUTPATIENT_CLINIC_OR_DEPARTMENT_OTHER)
Admission: RE | Admit: 2020-09-20 | Discharge: 2020-09-20 | Disposition: A | Discharge status: Home / Self Care | Payer: 59 | Source: Ambulatory Visit | Attending: Obstetrics & Gynecology | Admitting: Obstetrics & Gynecology

## 2020-09-20 ENCOUNTER — Other Ambulatory Visit: Payer: Self-pay

## 2020-09-20 DIAGNOSIS — Z1231 Encounter for screening mammogram for malignant neoplasm of breast: Secondary | ICD-10-CM | POA: Insufficient documentation

## 2020-09-21 ENCOUNTER — Telehealth (INDEPENDENT_AMBULATORY_CARE_PROVIDER_SITE_OTHER): Payer: 59 | Admitting: Registered Nurse

## 2020-09-21 ENCOUNTER — Encounter: Payer: Self-pay | Admitting: Registered Nurse

## 2020-09-21 ENCOUNTER — Other Ambulatory Visit: Payer: Self-pay | Admitting: Family Medicine

## 2020-09-21 DIAGNOSIS — R0681 Apnea, not elsewhere classified: Secondary | ICD-10-CM

## 2020-09-21 NOTE — Progress Notes (Signed)
I connected with  Charlene [name] on 09/21/20 by a video enabled telemedicine application and verified that I am speaking with the correct person using two identifiers.   I discussed the limitations of evaluation and management by telemedicine. The patient expressed understanding and agreed to proceed.

## 2020-09-21 NOTE — Progress Notes (Signed)
Telemedicine Encounter- SOAP NOTE Established Patient  This telephone encounter was conducted with the patient's (or proxy's) verbal consent via audio telecommunications: yes/no: Yes Patient was instructed to have this encounter in a suitably private space; and to only have persons present to whom they give permission to participate. In addition, patient identity was confirmed by use of name plus two identifiers (DOB and address).  I discussed the limitations, risks, security and privacy concerns of performing an evaluation and management service by telephone and the availability of in person appointments. I also discussed with the patient that there may be a patient responsible charge related to this service. The patient expressed understanding and agreed to proceed.  I spent a total of 15 minutes talking with the patient or their proxy.  Patient at home Provider in office  Participants: Jari Sportsman, NP and Georgianne Fick  Chief Complaint  Patient presents with   needs a referral for a sleep study    Subjective   Charlene [name] is a 40 y.o. established patient. Telephone visit today for referral  HPI Sleep study Suspects OSA due to - longstanding hx of 12+ hours of sleep each day with ongoing daytime somnolence, severe snoring, weight gain. 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   Migraines 06/08/2014   Multiple sclerosis (HCC)    History of abnormal cervical Pap smear 10/30/2011   Adjustment disorder with anxiety 01/05/2008    Past Medical  History:  Diagnosis Date   Alcoholism (HCC)    Anxiety and depression    Bipolar 2 disorder (HCC)    Borderline personality disorder (HCC)    Claustrophobia    Depression    Elevated blood sugar 06/08/2014   Elevated LFTs    Fatty liver 10/2014   GERD (gastroesophageal reflux disease)    Heart murmur    Hyperlipidemia    Infertility associated with anovulation    Low grade squamous intraepithelial lesion (LGSIL) on Pap smear 10/30/2011   Migraines    MS (multiple sclerosis) (HCC)    Obesity in pregnancy, antepartum    Personal history of pre-term labor    Pregnancy induced hypertension    Psoriasis     Current Outpatient Medications  Medication Sig Dispense Refill   acetaminophen (TYLENOL) 500 MG tablet Take 1,000-1,500 mg by mouth daily as needed for moderate pain or headache.     ALPRAZolam (XANAX) 0.5 MG tablet Take 0.5 mg by mouth daily as needed.     Biotin 70017 MCG TABS Take by mouth daily.     clobetasol (TEMOVATE) 0.05 % external solution Apply 1 application topically 2 (two) times daily.     LATUDA 40 MG TABS tablet Take 1 tablet daily     Multiple Vitamin (MULTIVITAMIN) tablet Take 1 tablet by mouth daily.     naltrexone (DEPADE) 50 MG tablet Take 50 mg by mouth daily.     ocrelizumab (OCREVUS) 300 MG/10ML injection See admin instructions.     rosuvastatin (CRESTOR) 10 MG tablet TAKE 1 TABLET(10 MG) BY MOUTH DAILY 90 tablet 0  SUMAtriptan (IMITREX) 5 MG/ACT nasal spray Place 1 spray into the nose as needed for migraine.     ustekinumab (STELARA) 90 MG/ML SOSY injection See admin instructions.     vortioxetine HBr (TRINTELLIX) 20 MG TABS tablet Take 20 mg by mouth daily.     zolmitriptan (ZOMIG) 5 MG tablet TAKE 1 TABLET BY MOUTH AS NEEDED FOR MIGRAINE     hydrocortisone (ANUSOL-HC) 25 MG suppository Place 1 suppository (25 mg total) rectally at bedtime. (Patient not taking: No sig reported) 24 suppository 1   ondansetron (ZOFRAN) 4 MG tablet Take 1 tablet (4 mg  total) by mouth every 8 (eight) hours as needed for nausea or vomiting. (Patient not taking: Reported on 09/21/2020) 30 tablet 0   No current facility-administered medications for this visit.    Allergies  Allergen Reactions   Ciprofloxacin     Other reaction(s): Mental Status Changes  Manic episode     Prednisone     Other reaction(s):  Puts her in manic episode. She is Bipolar      Social History   Socioeconomic History   Marital status: Married    Spouse name: Not on file   Number of children: Not on file   Years of education: Not on file   Highest education level: Not on file  Occupational History   Occupation: Print production planner  Tobacco Use   Smoking status: Former    Packs/day: 1.00    Years: 15.00    Pack years: 15.00    Types: Cigarettes    Quit date: 07/02/2010    Years since quitting: 10.2   Smokeless tobacco: Never  Vaping Use   Vaping Use: Never used  Substance and Sexual Activity   Alcohol use: Yes    Comment: alcoholic - so she either does not drink at all or she over drinks   Drug use: No   Sexual activity: Yes    Partners: Male    Birth control/protection: None    Comment: vasectomy  Other Topics Concern   Not on file  Social History Narrative   Work or School: homemaker      Home Situation: lives with husband, 2 twins (2.50 yo) and 63 month old in 2016      Spiritual Beliefs: none      Lifestyle: no regular exercise, poor diet      Social Determinants of Health   Financial Resource Strain: Not on file  Food Insecurity: Not on file  Transportation Needs: Not on file  Physical Activity: Not on file  Stress: Not on file  Social Connections: Not on file  Intimate Partner Violence: Not on file    Review of Systems  Constitutional: Negative.   HENT: Negative.    Eyes: Negative.   Respiratory: Negative.    Cardiovascular: Negative.   Gastrointestinal: Negative.   Genitourinary: Negative.   Musculoskeletal: Negative.   Skin: Negative.    Neurological: Negative.   Endo/Heme/Allergies: Negative.   Psychiatric/Behavioral: Negative.    All other systems reviewed and are negative.  Objective   Vitals as reported by the patient: There were no vitals filed for this visit.  There are no diagnoses linked to this encounter.  PLAN Fits a lot of symptoms of OSA. Will refer to pulmonology as she is good candidate for sleep study and potentially CPAP/BiPAP therapy if warranted. Patient encouraged to call clinic with any questions, comments, or concerns.  I discussed the assessment and treatment plan with the patient. The patient was provided  an opportunity to ask questions and all were answered. The patient agreed with the plan and demonstrated an understanding of the instructions.   The patient was advised to call back or seek an in-person evaluation if the symptoms worsen or if the condition fails to improve as anticipated.  I provided 15 minutes of non-face-to-face time during this encounter.  Janeece Agee, NP

## 2020-09-23 ENCOUNTER — Other Ambulatory Visit (HOSPITAL_BASED_OUTPATIENT_CLINIC_OR_DEPARTMENT_OTHER): Payer: Self-pay | Admitting: Obstetrics & Gynecology

## 2020-09-23 MED ORDER — NONFORMULARY OR COMPOUNDED ITEM
1 refills | Status: DC
Start: 2020-09-23 — End: 2021-01-12

## 2020-10-03 ENCOUNTER — Other Ambulatory Visit: Payer: Self-pay

## 2020-10-03 ENCOUNTER — Ambulatory Visit (AMBULATORY_SURGERY_CENTER): Payer: 59

## 2020-10-03 VITALS — Ht 66.5 in | Wt 240.0 lb

## 2020-10-03 DIAGNOSIS — K625 Hemorrhage of anus and rectum: Secondary | ICD-10-CM

## 2020-10-03 NOTE — Progress Notes (Signed)
Pre visit completed via phone; Patient verified name, DOB, and address; No egg or soy allergy known to patient  No issues known to pt with past sedation with any surgeries or procedures Patient denies ever being told they had issues or difficulty with intubation  No FH of Malignant Hyperthermia Pt is not on diet pills Pt is not on  home 02  Pt is not on blood thinners  Pt denies issues with constipation  No A fib or A flutter  EMMI video via MyChart  COVID 19 guidelines implemented in [facility] today with Pt and RN  Pt is fully vaccinated for Covid x 2;  Due to the COVID-19 pandemic we are asking patients to follow certain guidelines.  Pt aware of COVID protocols and [facility] guidelines   Patient reports she is an alcoholic;  Patient reports she already has the prep (Sutba) at home; RX not sent in;

## 2020-10-04 ENCOUNTER — Telehealth (HOSPITAL_BASED_OUTPATIENT_CLINIC_OR_DEPARTMENT_OTHER): Payer: Self-pay

## 2020-10-04 NOTE — Telephone Encounter (Signed)
Patient called today with a concern about the testosterone compounded cream that was given to her a week ago. Patient states she did not read the label correctly and thought it said to apply 1-2 a day. The Rx says to apply 1-2 a week. Patient states she does not feel any different. She wants to know if she should be concerned about anything or if she needs to stop medication or what. Please advise.

## 2020-10-05 ENCOUNTER — Encounter: Payer: Self-pay | Admitting: Gastroenterology

## 2020-10-07 NOTE — Telephone Encounter (Signed)
DPR reviewed. LMOVM that it should not have caused any harm that she used the cream 1-2 times a day for a few days. Advised that the cream does take several weeks for the blood level to increase, that its not a medication that works a few hours later. Advised to continue using 1-2 times weekly and her level would be checked in December. Advised to call with any questions.

## 2020-10-12 ENCOUNTER — Telehealth: Payer: Self-pay | Admitting: Family Medicine

## 2020-10-12 NOTE — Telephone Encounter (Signed)
Patient said that she thought she was going to be referred to a pulmonologist about sleep apnea but hasn't been contacted by anyone.  Please advise

## 2020-10-12 NOTE — Telephone Encounter (Signed)
Called and spoke to patient about referral. Pt stated that she did speak with someone about her referral at pulmonary and she is now scheduled. No further concerns at this time.

## 2020-10-17 ENCOUNTER — Encounter: Payer: Self-pay | Admitting: Gastroenterology

## 2020-10-17 ENCOUNTER — Ambulatory Visit (AMBULATORY_SURGERY_CENTER): Payer: 59 | Admitting: Gastroenterology

## 2020-10-17 ENCOUNTER — Other Ambulatory Visit: Payer: Self-pay

## 2020-10-17 VITALS — BP 119/82 | HR 74 | Temp 97.2°F | Resp 14 | Ht 66.5 in | Wt 240.0 lb

## 2020-10-17 DIAGNOSIS — K625 Hemorrhage of anus and rectum: Secondary | ICD-10-CM | POA: Diagnosis not present

## 2020-10-17 HISTORY — PX: COLONOSCOPY: SHX174

## 2020-10-17 MED ORDER — SODIUM CHLORIDE 0.9 % IV SOLN: 500.0000 mL | Freq: Once | INTRAVENOUS | Status: DC

## 2020-10-17 MED ORDER — MICONAZOLE NITRATE 2 % EX CREA
1.0000 | TOPICAL_CREAM | Freq: Two times a day (BID) | CUTANEOUS | 0 refills | Status: AC
Start: 2020-10-17 — End: 2020-11-28

## 2020-10-17 NOTE — Patient Instructions (Signed)
YOU HAD AN ENDOSCOPIC PROCEDURE TODAY AT THE [facility]:   Refer to the procedure report that was given to you for any specific questions about what was found during the examination.  If the procedure report does not answer your questions, please call your gastroenterologist to clarify.  If you requested that your care partner not be given the details of your procedure findings, then the procedure report has been included in a sealed envelope for you to review at your convenience later.  YOU SHOULD EXPECT: Some feelings of bloating in the abdomen. Passage of more gas than usual.  Walking can help get rid of the air that was put into your GI tract during the procedure and reduce the bloating. If you had a lower endoscopy (such as a colonoscopy or flexible sigmoidoscopy) you may notice spotting of blood in your stool or on the toilet paper. If you underwent a bowel prep for your procedure, you may not have a normal bowel movement for a few days.  Please Note:  You might notice some irritation and congestion in your nose or some drainage.  This is from the oxygen used during your procedure.  There is no need for concern and it should clear up in a day or so.  SYMPTOMS TO REPORT IMMEDIATELY:   Following lower endoscopy (colonoscopy or flexible sigmoidoscopy):  Excessive amounts of blood in the stool  Significant tenderness or worsening of abdominal pains  Swelling of the abdomen that is new, acute  Fever of 100F or higher  For urgent or emergent issues, a gastroenterologist can be reached at any hour by calling [phone]. Do not use MyChart messaging for urgent concerns.    DIET:  We do recommend a small meal at first, but then you may proceed to your regular diet.  Drink plenty of fluids but you should avoid alcoholic beverages for 24 hours.  ACTIVITY:  You should plan to take it easy for the rest of today and you should NOT DRIVE or use heavy machinery until tomorrow (because  of the sedation medicines used during the test).    FOLLOW UP: Our staff will call the number listed on your records 48-72 hours following your procedure to check on you and address any questions or concerns that you may have regarding the information given to you following your procedure. If we do not reach you, we will leave a message.  We will attempt to reach you two times.  During this call, we will ask if you have developed any symptoms of COVID 19. If you develop any symptoms (ie: fever, flu-like symptoms, shortness of breath, cough etc.) before then, please call [phone].  If you test positive for Covid 19 in the 2 weeks post procedure, please call and report this information to us.    If any biopsies were taken you will be contacted by phone or by letter within the next 1-3 weeks.  Please call us at [phone] if you have not heard about the biopsies in 3 weeks.    SIGNATURES/CONFIDENTIALITY: You and/or your care partner have signed paperwork which will be entered into your electronic medical record.  These signatures attest to the fact that that the information above on your After Visit Summary has been reviewed and is understood.  Full responsibility of the confidentiality of this discharge information lies with you and/or your care-partner. 

## 2020-10-17 NOTE — Progress Notes (Signed)
Report given to PACU, vss 

## 2020-10-17 NOTE — Op Note (Signed)
[facility] Patient Name: Charlene [name] Procedure Date: 10/17/2020 11:33 AM MRN: [id number] Endoscopist: Napoleon Form , MD Age: 40 Referring MD:  Date of Birth: 02-05-1980 Gender: Female Account #: [id number] Procedure:                Colonoscopy Indications:              Evaluation of unexplained GI bleeding presenting                            with Hematochezia Medicines:                Monitored Anesthesia Care Procedure:                Pre-Anesthesia Assessment:                           - Prior to the procedure, a History and Physical                            was performed, and patient medications and                            allergies were reviewed. The patient's tolerance of                            previous anesthesia was also reviewed. The risks                            and benefits of the procedure and the sedation                            options and risks were discussed with the patient.                            All questions were answered, and informed consent                            was obtained. Prior Anticoagulants: The patient has                            taken no previous anticoagulant or antiplatelet                            agents. ASA Grade Assessment: II - A patient with                            mild systemic disease. After reviewing the risks                            and benefits, the patient was deemed in                            satisfactory condition to undergo the procedure.  After obtaining informed consent, the colonoscope                            was passed under direct vision. Throughout the                            procedure, the patient's blood pressure, pulse, and                            oxygen saturations were monitored continuously. The                            Olympus PCF-H190DL (#[id number]) Colonoscope was                            introduced through the anus and advanced  to the the                            cecum, identified by appendiceal orifice and                            ileocecal valve. The colonoscopy was performed                            without difficulty. The patient tolerated the                            procedure well. The quality of the bowel                            preparation was excellent. The ileocecal valve,                            appendiceal orifice, and rectum were photographed. Scope In: 11:40:57 AM Scope Out: 11:53:39 AM Scope Withdrawal Time: 0 hours 6 minutes 35 seconds  Total Procedure Duration: 0 hours 12 minutes 42 seconds  Findings:                 The perianal exam findings include a perianal                            fungal rash.                           Non-bleeding external and internal hemorrhoids were                            found during retroflexion. The hemorrhoids were                            medium-sized.                           The exam was otherwise without abnormality. Complications:            No immediate complications. Estimated Blood Loss:  Estimated blood loss was minimal. Impression:               - Perianal fungal rash found on perianal exam.                           - Non-bleeding external and internal hemorrhoids.                           - The examination was otherwise normal.                           - No specimens collected. Recommendation:           - Patient has a contact number available for                            emergencies. The signs and symptoms of potential                            delayed complications were discussed with the                            patient. Return to normal activities tomorrow.                            Written discharge instructions were provided to the                            patient.                           - Resume previous diet.                           - Continue present medications.                           - Repeat  colonoscopy in 10 years for screening                            purposes.                           - Use Miconazole 2% cream, apply thin layer twice                            daily in the perinanal area for 4-6 weeks, follow                            up with Dermatology if no improvement. Napoleon Form, MD 10/17/2020 11:58:58 AM This report has been signed electronically.

## 2020-10-17 NOTE — Progress Notes (Signed)
[facility] History and Physical   Primary Care Physician:  [name], MD   Reason for Procedure:  Rectal bleeding  Plan:    Diagnostic colonoscopy with possible interventions as needed     HPI: Charlene [name] is a very pleasant 40 y.o. female here for colonoscopy for evaluation of rectal bleeding. Denies any nausea, vomiting, abdominal pain,or  melena  The risks and benefits as well as alternatives of endoscopic procedure(s) have been discussed and reviewed. All questions answered. The patient agrees to proceed.    Past Medical History:  Diagnosis Date   Alcoholism (HCC)    Anxiety and depression    Bipolar 2 disorder (HCC)    Borderline personality disorder (HCC)    Claustrophobia    on meds   Depression    on meds   Elevated blood sugar 06/08/2014   Elevated LFTs    Fatty liver 10/2014   GERD (gastroesophageal reflux disease)    diet related   Heart murmur    Hyperlipidemia    on meds   Infertility associated with anovulation    Low grade squamous intraepithelial lesion (LGSIL) on Pap smear 10/30/2011   Migraines    MS (multiple sclerosis) (HCC)    Neuromuscular disorder (HCC)    Obesity in pregnancy, antepartum    Personal history of pre-term labor    Pregnancy induced hypertension    pregnancy related- hx of   Psoriasis     Past Surgical History:  Procedure Laterality Date   DILATION AND CURETTAGE OF UTERUS  8/10   DILATION AND EVACUATION  12/29/2011   Procedure: DILATATION AND EVACUATION;  Surgeon: Catalina Antigua, MD;  Location: [facility];  Service: Gynecology;;  Dr, Emelda Fear transferred case to Dr. Jolayne Panther   LAPAROSCOPIC GASTRIC SLEEVE RESECTION N/A 12/07/2014   Procedure: LAPAROSCOPIC GASTRIC SLEEVE RESECTION;  Surgeon: Ovidio Kin, MD;  Location: [facility];  Service: General;  Laterality: N/A;    Prior to Admission medications   Medication Sig Start Date End Date Taking? Authorizing Provider  amphetamine-dextroamphetamine  (ADDERALL) 20 MG tablet Take 20 mg by mouth daily as needed. 09/21/20  Yes [provider]  Biotin 73710 MCG TABS Take 1 tablet by mouth daily.   Yes [provider]  LATUDA 40 MG TABS tablet Take 40 mg by mouth daily with breakfast. Take 1 tablet daily 02/07/16  Yes [provider]  NONFORMULARY OR COMPOUNDED ITEM Topical testosterone cream 1mg /0.57ml.  Apply topically 0.28ml topically one to two times weekly.  Disp: 3 month supply.  #1RF. 09/23/20  Yes 11/23/20, MD  ondansetron (ZOFRAN) 4 MG tablet Take 1 tablet (4 mg total) by mouth every 8 (eight) hours as needed for nausea or vomiting. 03/01/20  Yes 03/03/20, MD  rosuvastatin (CRESTOR) 10 MG tablet TAKE 1 TABLET(10 MG) BY MOUTH DAILY 09/21/20  Yes 11/21/20, MD  vortioxetine HBr (TRINTELLIX) 20 MG TABS tablet Take 20 mg by mouth daily.   Yes [provider]  acetaminophen (TYLENOL) 500 MG tablet Take 1,000-1,500 mg by mouth daily as needed for moderate pain or headache.    [provider]  ALPRAZolam [name]) 0.5 MG tablet Take 0.5 mg by mouth daily as needed. 04/12/20   [provider]  clobetasol (TEMOVATE) 0.05 % external solution Apply 1 application topically 2 (two) times daily. 08/02/20   [provider]  clonazePAM (KLONOPIN) 1 MG tablet Take 1 mg by mouth at bedtime as needed. 09/28/20   [provider]  hydrocortisone (ANUSOL-HC) 25 MG suppository Place 1 suppository (25 mg total) rectally at bedtime. Patient not taking: No sig reported 02/23/20   Napoleon Form, MD  Multiple Vitamin (MULTIVITAMIN) tablet Take 1 tablet by mouth daily.    [provider]  naltrexone (DEPADE) 50 MG tablet Take 50 mg by mouth daily. 03/23/20   [provider]  ocrelizumab (OCREVUS) 300 MG/10ML injection See admin instructions.    [provider]  SUMAtriptan (IMITREX) 5 MG/ACT nasal spray Place 1 spray into the nose as needed for migraine.     [provider]  ustekinumab Marcy Panning) 90 MG/ML SOSY injection See admin instructions. 08/02/20   [provider]  zolmitriptan (ZOMIG) 5 MG tablet TAKE 1 TABLET BY MOUTH AS NEEDED FOR MIGRAINE    [provider]    Current Outpatient Medications  Medication Sig Dispense Refill   amphetamine-dextroamphetamine (ADDERALL) 20 MG tablet Take 20 mg by mouth daily as needed.     Biotin 16109 MCG TABS Take 1 tablet by mouth daily.     LATUDA 40 MG TABS tablet Take 40 mg by mouth daily with breakfast. Take 1 tablet daily     NONFORMULARY OR COMPOUNDED ITEM Topical testosterone cream 1mg /0.96ml.  Apply topically 0.70ml topically one to two times weekly.  Disp: 3 month supply.  #1RF. 1 each 1   ondansetron (ZOFRAN) 4 MG tablet Take 1 tablet (4 mg total) by mouth every 8 (eight) hours as needed for nausea or vomiting. 30 tablet 0   rosuvastatin (CRESTOR) 10 MG tablet TAKE 1 TABLET(10 MG) BY MOUTH DAILY 90 tablet 0   vortioxetine HBr (TRINTELLIX) 20 MG TABS tablet Take 20 mg by mouth daily.     acetaminophen (TYLENOL) 500 MG tablet Take 1,000-1,500 mg by mouth daily as needed for moderate pain or headache.     ALPRAZolam (XANAX) 0.5 MG tablet Take 0.5 mg by mouth daily as needed.     clobetasol (TEMOVATE) 0.05 % external solution Apply 1 application topically 2 (two) times daily.     clonazePAM (KLONOPIN) 1 MG tablet Take 1 mg by mouth at bedtime as needed.     hydrocortisone (ANUSOL-HC) 25 MG suppository Place 1 suppository (25 mg total) rectally at bedtime. (Patient not taking: No sig reported) 24 suppository 1   Multiple Vitamin (MULTIVITAMIN) tablet Take 1 tablet by mouth daily.     naltrexone (DEPADE) 50 MG tablet Take 50 mg by mouth daily.     ocrelizumab (OCREVUS) 300 MG/10ML injection See admin instructions.     SUMAtriptan (IMITREX) 5 MG/ACT nasal spray Place 1 spray into the nose as needed for migraine.     ustekinumab (STELARA) 90 MG/ML SOSY injection See admin  instructions.     zolmitriptan (ZOMIG) 5 MG tablet TAKE 1 TABLET BY MOUTH AS NEEDED FOR MIGRAINE     Current Facility-Administered Medications  Medication Dose Route Frequency Provider Last Rate Last Admin   0.9 %  sodium chloride infusion  500 mL Intravenous Once 0m, MD        Allergies as of 10/17/2020 - Review Complete 10/17/2020  Allergen Reaction Noted   Ciprofloxacin Other (See Comments) 02/11/2016   Prednisone Other (See Comments) 02/11/2016    Family History  Problem Relation Age of Onset   Hyperlipidemia Mother    Mental illness Mother    Dementia Mother        early onset   Alcoholism Father    Heart disease Father    Alcoholism  Maternal Grandmother    Mental illness Maternal Grandmother    Esophageal cancer Maternal Grandmother    Heart attack Maternal Grandfather    Colon cancer Neg Hx    Stomach cancer Neg Hx    Colon polyps Neg Hx    Rectal cancer Neg Hx     Social History   Socioeconomic History   Marital status: Married    Spouse name: Not on file   Number of children: Not on file   Years of education: Not on file   Highest education level: Not on file  Occupational History   Occupation: Print production planner  Tobacco Use   Smoking status: Former    Packs/day: 1.00    Years: 15.00    Pack years: 15.00    Types: Cigarettes    Quit date: 07/02/2010    Years since quitting: 10.3   Smokeless tobacco: Never  Vaping Use   Vaping Use: Never used  Substance and Sexual Activity   Alcohol use: Yes    Comment: alcoholic - so she either does not drink at all or she over drinks   Drug use: No   Sexual activity: Yes    Partners: Male    Birth control/protection: None    Comment: vasectomy  Other Topics Concern   Not on file  Social History Narrative   Work or School: homemaker      Home Situation: lives with husband, 2 twins (2.1 yo) and 63 month old in 2016      Spiritual Beliefs: none      Lifestyle: no regular exercise, poor diet       Social Determinants of Health   Financial Resource Strain: Not on file  Food Insecurity: Not on file  Transportation Needs: Not on file  Physical Activity: Not on file  Stress: Not on file  Social Connections: Not on file  Intimate Partner Violence: Not on file    Review of Systems:  All other review of systems negative except as mentioned in the HPI.  Physical Exam: Vital signs in last 24 hours: BP 140/80 (BP Location: Right Arm, Patient Position: Sitting, Cuff Size: Normal)   Pulse 82   Temp (!) 97.2 F (36.2 C)   Ht 5' 6.5" (1.689 m)   Wt 240 lb (108.9 kg)   LMP 09/30/2020 (Exact Date)   SpO2 96%   BMI 38.16 kg/m     General:   Alert, NAD Lungs:  Clear .   Heart:  Regular rate and rhythm Abdomen:  Soft, nontender and nondistended. Neuro/Psych:  Alert and cooperative. Normal mood and affect. A and O x 3  Reviewed labs, radiology imaging, old records and pertinent past GI work up  Patient is appropriate for planned procedure(s) and anesthesia in an ambulatory setting   K. Scherry Ran , MD [phone]

## 2020-10-17 NOTE — Progress Notes (Signed)
Vitals-CW  Lastperiod September 16th 2022  Pt's states no medical or surgical changes since previsit or office visit.

## 2020-10-19 ENCOUNTER — Telehealth: Payer: Self-pay

## 2020-10-19 NOTE — Telephone Encounter (Signed)
Left message on answering machine. 

## 2020-10-19 NOTE — Telephone Encounter (Signed)
Attempted f/u call. No answer, left VM. 

## 2020-11-10 ENCOUNTER — Ambulatory Visit (INDEPENDENT_AMBULATORY_CARE_PROVIDER_SITE_OTHER): Payer: 59 | Admitting: Pulmonary Disease

## 2020-11-10 ENCOUNTER — Encounter: Payer: Self-pay | Admitting: Pulmonary Disease

## 2020-11-10 ENCOUNTER — Other Ambulatory Visit: Payer: Self-pay

## 2020-11-10 VITALS — BP 116/80 | HR 106 | Temp 98.0°F | Ht 66.0 in | Wt 250.0 lb

## 2020-11-10 DIAGNOSIS — G4733 Obstructive sleep apnea (adult) (pediatric): Secondary | ICD-10-CM | POA: Diagnosis not present

## 2020-11-10 NOTE — Progress Notes (Signed)
Charlene [name]    [id number]    [birth year]  Primary Care Physician:Tabori, Helane Rima, MD  Referring Physician: Janeece Agee, NP [address] [place],  [place] [address]  Chief complaint:   Patient being seen for snoring, excessive daytime sleepiness  HPI:  Longstanding history of snoring worse in the last year Nonrestorative sleep Wakes up still feeling tired Usually goes to bed between 930 and 10:30 PM 15 to 20 minutes to fall asleep Multiple awakenings final wake up time usually about 10 AM Usually wakes up about 6 AM to get at kids ready for school, will go back to laying in bed and finally try and get up by 10 Will usually use the snooze button multiple times before finally getting up  Very easy for her to take a daytime nap  Admits to dryness of mouth in the morning Occasional sore throat Occasional night sweats Sometimes wake up wakes up with a headache  Dad snored Reformed smoker   Outpatient Encounter Medications as of 11/10/2020  Medication Sig   ALPRAZolam (XANAX) 0.5 MG tablet Take 0.5 mg by mouth daily as needed.   amphetamine-dextroamphetamine (ADDERALL) 20 MG tablet Take 20 mg by mouth daily as needed.   Biotin 70488 MCG TABS Take 1 tablet by mouth daily.   clobetasol (TEMOVATE) 0.05 % external solution Apply 1 application topically 2 (two) times daily.   clonazePAM (KLONOPIN) 1 MG tablet Take 1 mg by mouth at bedtime as needed.   LATUDA 40 MG TABS tablet Take 40 mg by mouth daily with breakfast. Take 1 tablet daily   miconazole (MICATIN) 2 % cream Apply 1 application topically 2 (two) times daily.   Multiple Vitamin (MULTIVITAMIN) tablet Take 1 tablet by mouth daily.   naltrexone (DEPADE) 50 MG tablet Take 50 mg by mouth daily.   NONFORMULARY OR COMPOUNDED ITEM Topical testosterone cream 1mg /0.56ml.  Apply topically 0.63ml topically one to two times weekly.  Disp: 3 month supply.  #1RF.   ocrelizumab (OCREVUS) 300 MG/10ML injection See  admin instructions.   ondansetron (ZOFRAN) 4 MG tablet Take 1 tablet (4 mg total) by mouth every 8 (eight) hours as needed for nausea or vomiting.   rosuvastatin (CRESTOR) 10 MG tablet TAKE 1 TABLET(10 MG) BY MOUTH DAILY   SUMAtriptan (IMITREX) 5 MG/ACT nasal spray Place 1 spray into the nose as needed for migraine.   ustekinumab (STELARA) 90 MG/ML SOSY injection See admin instructions.   vortioxetine HBr (TRINTELLIX) 20 MG TABS tablet Take 20 mg by mouth daily.   zolmitriptan (ZOMIG) 5 MG tablet TAKE 1 TABLET BY MOUTH AS NEEDED FOR MIGRAINE   No facility-administered encounter medications on file as of 11/10/2020.    Allergies as of 11/10/2020 - Review Complete 11/10/2020  Allergen Reaction Noted   Ciprofloxacin Other (See Comments) 02/11/2016   Prednisone Other (See Comments) 02/11/2016    Past Medical History:  Diagnosis Date   Alcoholism (HCC)    Anxiety and depression    Bipolar 2 disorder (HCC)    Borderline personality disorder (HCC)    Claustrophobia    on meds   Depression    on meds   Elevated blood sugar 06/08/2014   Elevated LFTs    Fatty liver 10/2014   GERD (gastroesophageal reflux disease)    diet related   Heart murmur    Hyperlipidemia    on meds   Infertility associated with anovulation    Low grade squamous intraepithelial  lesion (LGSIL) on Pap smear 10/30/2011   Migraines    MS (multiple sclerosis) (HCC)    Neuromuscular disorder (HCC)    Obesity in pregnancy, antepartum    Personal history of pre-term labor    Pregnancy induced hypertension    pregnancy related- hx of   Psoriasis     Past Surgical History:  Procedure Laterality Date   COLONOSCOPY  10/17/2020   DILATION AND CURETTAGE OF UTERUS  08/15/2008   DILATION AND EVACUATION  12/29/2011   Procedure: DILATATION AND EVACUATION;  Surgeon: Catalina Antigua, MD;  Location: [facility];  Service: Gynecology;;  Dr, Emelda Fear transferred case to Dr. Jolayne Panther   LAPAROSCOPIC GASTRIC SLEEVE RESECTION N/A  12/07/2014   Procedure: LAPAROSCOPIC GASTRIC SLEEVE RESECTION;  Surgeon: Ovidio Kin, MD;  Location: [facility];  Service: General;  Laterality: N/A;    Family History  Problem Relation Age of Onset   Hyperlipidemia Mother    Mental illness Mother    Dementia Mother        early onset   Alcoholism Father    Heart disease Father    Alcoholism Maternal Grandmother    Mental illness Maternal Grandmother    Esophageal cancer Maternal Grandmother    Heart attack Maternal Grandfather    Colon cancer Neg Hx    Stomach cancer Neg Hx    Colon polyps Neg Hx    Rectal cancer Neg Hx     Social History   Socioeconomic History   Marital status: Married    Spouse name: Not on file   Number of children: Not on file   Years of education: Not on file   Highest education level: Not on file  Occupational History   Occupation: Print production planner  Tobacco Use   Smoking status: Former    Packs/day: 1.00    Years: 15.00    Pack years: 15.00    Types: Cigarettes    Quit date: 07/02/2010    Years since quitting: 10.3   Smokeless tobacco: Never  Vaping Use   Vaping Use: Never used  Substance and Sexual Activity   Alcohol use: Yes    Comment: alcoholic - so she either does not drink at all or she over drinks   Drug use: No   Sexual activity: Yes    Partners: Male    Birth control/protection: None    Comment: vasectomy  Other Topics Concern   Not on file  Social History Narrative   Work or School: homemaker      Home Situation: lives with husband, 2 twins (2.64 yo) and 12 month old in 2016      Spiritual Beliefs: none      Lifestyle: no regular exercise, poor diet      Social Determinants of Health   Financial Resource Strain: Not on file  Food Insecurity: Not on file  Transportation Needs: Not on file  Physical Activity: Not on file  Stress: Not on file  Social Connections: Not on file  Intimate Partner Violence: Not on file    Review of Systems  Constitutional:  Positive for  fatigue.  Psychiatric/Behavioral:  Positive for sleep disturbance.    Vitals:   11/10/20 1041  BP: 116/80  Pulse: (!) 106  Temp: 98 F (36.7 C)  SpO2: 98%     Physical Exam Constitutional:      Appearance: She is obese.  HENT:     Head: Normocephalic.     Nose: Nose normal.     Mouth/Throat:  Mouth: Mucous membranes are moist.     Comments: Moist oral mucosa, crowded oropharynx, Mallampati 4 Eyes:     Pupils: Pupils are equal, round, and reactive to light.  Cardiovascular:     Rate and Rhythm: Normal rate and regular rhythm.     Heart sounds: No murmur heard.   No friction rub.  Pulmonary:     Effort: No respiratory distress.     Breath sounds: No stridor. No wheezing or rhonchi.  Musculoskeletal:     Cervical back: No rigidity or tenderness.  Neurological:     Mental Status: She is alert.  Psychiatric:        Mood and Affect: Mood normal.   Results of the Epworth flowsheet 11/10/2020  Sitting and reading 2  Watching TV 2  Sitting, inactive in a public place (e.g. a theatre or a meeting) 0  As a passenger in a car for an hour without a break 0  Lying down to rest in the afternoon when circumstances permit 1  Sitting and talking to someone 1  Sitting quietly after a lunch without alcohol 0  In a car, while stopped for a few minutes in traffic 1  Total score 7     Data Reviewed: No previous sleep study  Assessment:   Pathophysiology of sleep disordered breathing reviewed with the patient  Treatment options reviewed with patient  Moderate probability of significant obstructive sleep apnea  Obesity  Daytime sleepiness  Daytime fatigue    Plan/Recommendations: Schedule patient for home sleep study  Weight loss efforts encouraged  Regular exercise encouraged  Tentative follow-up in 4 months   Virl Diamond MD [facility] 11/10/2020, 11:01 AM  CC: Janeece Agee, NP

## 2020-11-10 NOTE — Patient Instructions (Signed)
Moderate probability of significant obstructive sleep apnea  We will schedule you for home sleep study Update you with results as soon as reviewed  Treatment options as we discussed  Tentative follow-up in about 4 months  Call with significant concerns  Sleep Apnea Sleep apnea affects breathing during sleep. It causes breathing to stop for 10 seconds or more, or to become shallow. People with sleep apnea usually snore loudly. It can also increase the risk of: Heart attack. Stroke. Being very overweight (obese). Diabetes. Heart failure. Irregular heartbeat. High blood pressure. The goal of treatment is to help you breathe normally again. What are the causes? The most common cause of this condition is a collapsed or blocked airway. There are three kinds of sleep apnea: Obstructive sleep apnea. This is caused by a blocked or collapsed airway. Central sleep apnea. This happens when the brain does not send the right signals to the muscles that control breathing. Mixed sleep apnea. This is a combination of obstructive and central sleep apnea. What increases the risk? Being overweight. Smoking. Having a small airway. Being older. Being female. Drinking alcohol. Taking medicines to calm yourself (sedatives or tranquilizers). Having family members with the condition. Having a tongue or tonsils that are larger than normal. What are the signs or symptoms? Trouble staying asleep. Loud snoring. Headaches in the morning. Waking up gasping. Dry mouth or sore throat in the morning. Being sleepy or tired during the day. If you are sleepy or tired during the day, you may also: Not be able to focus your mind (concentrate). Forget things. Get angry a lot and have mood swings. Feel sad (depressed). Have changes in your personality. Have less interest in sex, if you are female. Be unable to have an erection, if you are female. How is this treated?  Sleeping on your side. Using a  medicine to get rid of mucus in your nose (decongestant). Avoiding the use of alcohol, medicines to help you relax, or certain pain medicines (narcotics). Losing weight, if needed. Changing your diet. Quitting smoking. Using a machine to open your airway while you sleep, such as: An oral appliance. This is a mouthpiece that shifts your lower jaw forward. A CPAP device. This device blows air through a mask when you breathe out (exhale). An EPAP device. This has valves that you put in each nostril. A BPAP device. This device blows air through a mask when you breathe in (inhale) and breathe out. Having surgery if other treatments do not work. Follow these instructions at home: Lifestyle Make changes that your doctor recommends. Eat a healthy diet. Lose weight if needed. Avoid alcohol, medicines to help you relax, and some pain medicines. Do not smoke or use any products that contain nicotine or tobacco. If you need help quitting, ask your doctor. General instructions Take over-the-counter and prescription medicines only as told by your doctor. If you were given a machine to use while you sleep, use it only as told by your doctor. If you are having surgery, make sure to tell your doctor you have sleep apnea. You may need to bring your device with you. Keep all follow-up visits. Contact a doctor if: The machine that you were given to use during sleep bothers you or does not seem to be working. You do not get better. You get worse. Get help right away if: Your chest hurts. You have trouble breathing in enough air. You have an uncomfortable feeling in your back, arms, or stomach. You have trouble  talking. One side of your body feels weak. A part of your face is hanging down. These symptoms may be an emergency. Get help right away. Call your local emergency services (911 in the [place] Do not wait to see if the symptoms will go away. Do not drive yourself to the hospital. Summary This  condition affects breathing during sleep. The most common cause is a collapsed or blocked airway. The goal of treatment is to help you breathe normally while you sleep. This information is not intended to replace advice given to you by your health care provider. Make sure you discuss any questions you have with your health care provider. Document Revised: 12/11/2019 Document Reviewed: 12/11/2019 Elsevier Patient Education  2022 [employer].

## 2020-11-23 ENCOUNTER — Telehealth (HOSPITAL_BASED_OUTPATIENT_CLINIC_OR_DEPARTMENT_OTHER): Payer: Self-pay

## 2020-11-23 NOTE — Telephone Encounter (Signed)
Patient called today with concerns about having low labido. She was given testosterone to take twice weekly and it seems to not be working.  She states they are getting ready to go on a 2 week, second honeymoon and would like to know if there is something else that you can call something else in for her. Please advise.

## 2020-11-25 ENCOUNTER — Encounter (HOSPITAL_BASED_OUTPATIENT_CLINIC_OR_DEPARTMENT_OTHER): Payer: Self-pay

## 2020-11-28 ENCOUNTER — Other Ambulatory Visit (HOSPITAL_BASED_OUTPATIENT_CLINIC_OR_DEPARTMENT_OTHER): Payer: Self-pay | Admitting: Obstetrics & Gynecology

## 2020-12-12 ENCOUNTER — Ambulatory Visit: Payer: 59 | Admitting: Family Medicine

## 2020-12-26 ENCOUNTER — Other Ambulatory Visit (HOSPITAL_BASED_OUTPATIENT_CLINIC_OR_DEPARTMENT_OTHER): Payer: Self-pay | Admitting: Obstetrics & Gynecology

## 2020-12-26 ENCOUNTER — Encounter (HOSPITAL_BASED_OUTPATIENT_CLINIC_OR_DEPARTMENT_OTHER): Payer: Self-pay

## 2020-12-26 ENCOUNTER — Other Ambulatory Visit: Payer: Self-pay

## 2020-12-26 ENCOUNTER — Other Ambulatory Visit (HOSPITAL_BASED_OUTPATIENT_CLINIC_OR_DEPARTMENT_OTHER): Payer: 59

## 2020-12-26 DIAGNOSIS — R6882 Decreased libido: Secondary | ICD-10-CM

## 2020-12-27 LAB — TESTOSTERONE, TOTAL, LC/MS/MS: Testosterone, total: 14 ng/dL

## 2020-12-30 NOTE — Addendum Note (Signed)
Addended by: Jerene Bears on: 12/30/2020 06:42 AM   Modules accepted: Orders

## 2021-01-12 ENCOUNTER — Other Ambulatory Visit (HOSPITAL_BASED_OUTPATIENT_CLINIC_OR_DEPARTMENT_OTHER): Payer: Self-pay | Admitting: *Deleted

## 2021-01-12 MED ORDER — NONFORMULARY OR COMPOUNDED ITEM: 1 refills | Status: DC

## 2021-01-17 ENCOUNTER — Telehealth (HOSPITAL_BASED_OUTPATIENT_CLINIC_OR_DEPARTMENT_OTHER): Payer: Self-pay

## 2021-01-17 NOTE — Telephone Encounter (Signed)
Patient called in last week for Testosterone cream because she almost out. Dr. Shawnie Pons printed the Rx and the pharmacy states that they never received it.

## 2021-01-23 ENCOUNTER — Other Ambulatory Visit (HOSPITAL_BASED_OUTPATIENT_CLINIC_OR_DEPARTMENT_OTHER): Payer: Self-pay | Admitting: Obstetrics & Gynecology

## 2021-01-23 MED ORDER — NONFORMULARY OR COMPOUNDED ITEM: 1 refills | Status: DC

## 2021-01-24 NOTE — Telephone Encounter (Signed)
Spoke with patient on 01/23/2021. Patient was notified that Rx had been sent. tbw

## 2021-01-27 ENCOUNTER — Other Ambulatory Visit: Payer: Self-pay

## 2021-01-27 ENCOUNTER — Ambulatory Visit (INDEPENDENT_AMBULATORY_CARE_PROVIDER_SITE_OTHER): Payer: 59

## 2021-01-27 DIAGNOSIS — G4733 Obstructive sleep apnea (adult) (pediatric): Secondary | ICD-10-CM

## 2021-01-31 ENCOUNTER — Telehealth: Payer: Self-pay | Admitting: Pulmonary Disease

## 2021-01-31 DIAGNOSIS — G4733 Obstructive sleep apnea (adult) (pediatric): Secondary | ICD-10-CM

## 2021-01-31 NOTE — Telephone Encounter (Signed)
Call patient  Sleep study result  Date of study: 01/27/2021  Impression: Moderate obstructive sleep apnea  Recommendation: DME referral  Recommend CPAP therapy for moderate obstructive sleep apnea  Auto titrating CPAP with pressure settings of 5-15 will be appropriate  Encourage weight loss measures  Follow-up in the office 4 to 6 weeks following initiation of treatment

## 2021-02-01 NOTE — Telephone Encounter (Signed)
Called patient but she did not answer. Left message for her to call back.  

## 2021-02-01 NOTE — Telephone Encounter (Signed)
I called the patient and left a message to call back for sleep study results.

## 2021-02-02 NOTE — Telephone Encounter (Signed)
Spoke with the pt and notified of results of sleep study. Pt verbalized understanding and was agreeable to CPAP therapy. I have placed DME referral for this and pt aware to contact the office for 31-90 day f/u once they begin using machine per insurance requirement.   

## 2021-02-02 NOTE — Telephone Encounter (Signed)
Patient returning call.

## 2021-03-21 ENCOUNTER — Other Ambulatory Visit: Payer: Self-pay

## 2021-03-21 ENCOUNTER — Other Ambulatory Visit (HOSPITAL_BASED_OUTPATIENT_CLINIC_OR_DEPARTMENT_OTHER): Payer: 59

## 2021-03-21 DIAGNOSIS — R6882 Decreased libido: Secondary | ICD-10-CM

## 2021-03-23 DIAGNOSIS — Z0289 Encounter for other administrative examinations: Secondary | ICD-10-CM

## 2021-03-23 LAB — TESTOSTERONE, TOTAL, LC/MS/MS: Testosterone, total: 17.1 ng/dL

## 2021-03-29 ENCOUNTER — Ambulatory Visit (INDEPENDENT_AMBULATORY_CARE_PROVIDER_SITE_OTHER): Payer: 59 | Admitting: Family Medicine

## 2021-03-29 ENCOUNTER — Other Ambulatory Visit: Payer: Self-pay

## 2021-03-29 ENCOUNTER — Encounter (INDEPENDENT_AMBULATORY_CARE_PROVIDER_SITE_OTHER): Payer: Self-pay | Admitting: Family Medicine

## 2021-03-29 VITALS — BP 137/87 | HR 93 | Temp 98.4°F | Ht 66.0 in | Wt 250.0 lb

## 2021-03-29 DIAGNOSIS — R0602 Shortness of breath: Secondary | ICD-10-CM

## 2021-03-29 DIAGNOSIS — Z9884 Bariatric surgery status: Secondary | ICD-10-CM

## 2021-03-29 DIAGNOSIS — G35 Multiple sclerosis: Secondary | ICD-10-CM | POA: Diagnosis not present

## 2021-03-29 DIAGNOSIS — R5383 Other fatigue: Secondary | ICD-10-CM | POA: Diagnosis not present

## 2021-03-29 DIAGNOSIS — F319 Bipolar disorder, unspecified: Secondary | ICD-10-CM | POA: Diagnosis not present

## 2021-03-29 DIAGNOSIS — Z6841 Body Mass Index (BMI) 40.0 and over, adult: Secondary | ICD-10-CM

## 2021-03-29 DIAGNOSIS — F1011 Alcohol abuse, in remission: Secondary | ICD-10-CM

## 2021-03-29 DIAGNOSIS — G4733 Obstructive sleep apnea (adult) (pediatric): Secondary | ICD-10-CM | POA: Diagnosis not present

## 2021-03-29 DIAGNOSIS — E7849 Other hyperlipidemia: Secondary | ICD-10-CM | POA: Diagnosis not present

## 2021-03-29 DIAGNOSIS — E669 Obesity, unspecified: Secondary | ICD-10-CM

## 2021-03-29 DIAGNOSIS — Z1331 Encounter for screening for depression: Secondary | ICD-10-CM | POA: Diagnosis not present

## 2021-03-29 DIAGNOSIS — E559 Vitamin D deficiency, unspecified: Secondary | ICD-10-CM

## 2021-03-29 DIAGNOSIS — F5081 Binge eating disorder: Secondary | ICD-10-CM

## 2021-03-30 LAB — LIPID PANEL WITH LDL/HDL RATIO
Cholesterol, Total: 317 mg/dL — ABNORMAL HIGH (ref 100–199)
HDL: 40 mg/dL (ref >39)
LDL Chol Calc (NIH): 198 mg/dL — ABNORMAL HIGH (ref 0–99)
LDL/HDL Ratio: 5 ratio — ABNORMAL HIGH (ref 0.0–3.2)
Triglycerides: 385 mg/dL — ABNORMAL HIGH (ref 0–149)
VLDL Cholesterol Cal: 79 mg/dL — ABNORMAL HIGH (ref 5–40)

## 2021-03-30 LAB — COMPREHENSIVE METABOLIC PANEL
ALT: 70 IU/L — ABNORMAL HIGH (ref 0–32)
AST: 62 IU/L — ABNORMAL HIGH (ref 0–40)
Albumin/Globulin Ratio: 1.6 (ref 1.2–2.2)
Albumin: 4.5 g/dL (ref 3.8–4.8)
Alkaline Phosphatase: 77 IU/L (ref 44–121)
BUN/Creatinine Ratio: 12 (ref 9–23)
BUN: 8 mg/dL (ref 6–24)
Bilirubin Total: 0.2 mg/dL (ref 0.0–1.2)
CO2: 23 mmol/L (ref 20–29)
Calcium: 9.6 mg/dL (ref 8.7–10.2)
Chloride: 99 mmol/L (ref 96–106)
Creatinine, Ser: 0.67 mg/dL (ref 0.57–1.00)
Globulin, Total: 2.8 g/dL (ref 1.5–4.5)
Glucose: 94 mg/dL (ref 70–99)
Potassium: 4.5 mmol/L (ref 3.5–5.2)
Sodium: 140 mmol/L (ref 134–144)
Total Protein: 7.3 g/dL (ref 6.0–8.5)
eGFR: 113 mL/min/{1.73_m2} (ref >59)

## 2021-03-30 LAB — HEMOGLOBIN A1C
Est. average glucose Bld gHb Est-mCnc: 108 mg/dL
Hgb A1c MFr Bld: 5.4 % (ref 4.8–5.6)

## 2021-03-30 LAB — T3: T3, Total: 129 ng/dL (ref 71–180)

## 2021-03-30 LAB — PREALBUMIN: PREALBUMIN: 21 mg/dL (ref 12–34)

## 2021-03-30 LAB — T4, FREE: Free T4: 0.99 ng/dL (ref 0.82–1.77)

## 2021-03-30 LAB — TSH: TSH: 1.48 u[IU]/mL (ref 0.450–4.500)

## 2021-03-30 LAB — INSULIN, RANDOM: INSULIN: 15.6 u[IU]/mL (ref 2.6–24.9)

## 2021-03-30 LAB — FOLATE: Folate: 6.9 ng/mL (ref >3.0)

## 2021-04-03 ENCOUNTER — Encounter (INDEPENDENT_AMBULATORY_CARE_PROVIDER_SITE_OTHER): Payer: Self-pay | Admitting: Family Medicine

## 2021-04-03 NOTE — Progress Notes (Signed)
? ? ? ?Chief Complaint:  ? ?OBESITY ?Charlene [name] (MR# [id number]) is a 41 y.o. female who presents for evaluation and treatment of obesity and related comorbidities. Current BMI is Body mass index is 40.35 kg/m?Marland Kitchen Charlene [name] has been struggling with her weight for many years and has been unsuccessful in either losing weight, maintaining weight loss, or reaching her healthy weight goal. ? ?Charlene [name] was referred by [facility] (wants revision to bypass). She doesn't  have much restriction after gastric sleeve. She doesn't like some vegetables but will eat brussels sprouts, zucchini, squash and cauliflower. Her first 30 minutes [employer] sausage egg and cheese McMuffin and diet coke (satisfied) may have leftovers in the morning or [employer] (bacon egg and cheese on biscuit). 3 pm Spaghetti (whole can) (fill). Dinner eating out frequency, kids burrito (chicken) from [employer].  ? ?Charlene [name] is currently in the action stage of change and ready to dedicate time achieving and maintaining a healthier weight. Charlene [name] is interested in becoming our patient and working on intensive lifestyle modifications including (but not limited to) diet and exercise for weight loss. ? ?Charlene [name]'s habits were reviewed today and are as follows: Her family eats meals together, she thinks her family will eat healthier with her, her desired weight loss is 90 pounds, she has been heavy most of her life, she started gaining weight as a child. Lost some as a teen and gained it back around 41 years old, her heaviest weight ever was 260 pounds, she is a picky eater and doesn't like to eat healthier foods, she has significant food cravings issues, she skips meals frequently, she is frequently drinking liquids with calories, she frequently makes poor food choices, she has binge eating behaviors, and she struggles with emotional eating. ? ?Depression Screen ?Charlene [name]'s Food and Mood (modified PHQ-9) score was 22. ? ?Depression screen [facility] 2/9 03/29/2021  ?Decreased Interest 3   ?Down, Depressed, Hopeless 3  ?PHQ - 2 Score 6  ?Altered sleeping 3  ?Tired, decreased energy 3  ?Change in appetite 3  ?Feeling bad or failure about yourself  3  ?Trouble concentrating 3  ?Moving slowly or fidgety/restless 1  ?Suicidal thoughts 0  ?PHQ-9 Score 22  ?Difficult doing work/chores Very difficult  ?Some recent data might be hidden  ? ?Subjective:  ? ?1. Other fatigue ?Charlene [name]'s EKG shows normal sinus rhythm. Charlene [name] reports daytime somnolence and reports waking up still tired. Patient has a history of symptoms of morning fatigue and morning headache. Charlene [name] generally gets 12 hours of sleep per night, and states that she has difficulty falling asleep. Snoring is present. Apneic episodes is present. Epworth Sleepiness Score is 9.   ? ?2. SOBOE (shortness of breath on exertion) ?Charlene [name]'s EKG shows normal sinus rhythm. Charlene [name] notes increasing shortness of breath with exercising and seems to be worsening over time with weight gain. She notes getting out of breath sooner with activity than she used to. This has not gotten worse recently. Charlene [name] denies shortness of breath at rest or orthopnea.  ? ?3. H/O bariatric surgery ?Charlene [name] had sleeve surgery in 2016 with Charlene [name]. She was seen for revision to bypass. She initially lost 80 lbs with sleeve.  ? ?4. Multiple sclerosis (Charlene [name]) ?Charlene [name] sees Dr. Harlen [name] with [facility] in [facility] managing visits every 6 months.  ? ?5. OSA (obstructive sleep apnea) ?Charlene [name] has a CPAP and wears her CPAP most nights.  ? ?6. Other hyperlipidemia ?Charlene [name] was diagnosed years ago and she is on Crestor previously but ran  out.  ? ?7. Bipolar affective disorder, remission status unspecified (Charlene [name]) ?Charlene [name] sees Charlene [name] at [facility]. She is weaning off Charlene [name] and Charlene [name] and adding Charlene [name].  ? ?8. History of alcohol abuse ?Charlene [name] has naltrexone that she hasn't taken in 2 weeks. Her last drink was last night.  ? ?9. Binge eating disorder ?Charlene [name] is on Vyvanse M-F. This medication is  being prescribed by psych [name].  ? ?10. Vitamin D deficiency ?Charlene [name] is currently on Vitamin D. She notes fatigue. Her last level was 32 2 months ago.  ? ?Assessment/Plan:  ? ?1. Other fatigue ?We will check EKG, IC and [name] today.Charlene [name] does feel that her weight is causing her energy to be lower than it should be. Fatigue may be related to obesity, depression or many other causes. [name] will be ordered, and in the meanwhile, Charlene [name] will focus on self care including making healthy food choices, increasing physical activity and focusing on stress reduction.  ?- EKG 12-Lead ?- Hemoglobin A1c ?- Insulin, random ?- T3 ?- T4, free ?- TSH ? ?2. SOBOE (shortness of breath on exertion) ?We will check EKG, IC and [name] today. Charlene [name] does feel that she gets out of breath more easily that she used to when she exercises. Charlene [name]'s shortness of breath appears to be obesity related and exercise induced. She has agreed to work on weight loss and gradually increase exercise to treat her exercise induced shortness of breath. Will continue to monitor closely.  ? ?3. H/O bariatric surgery ?We will check Charlene [name]'s Prealbumin today.  ? ?- Prealbumin ? ?4. Multiple sclerosis (Charlene [name]) ?Charlene [name] will follow up next schedule appointment.  ? ?5. OSA (obstructive sleep apnea) ?Charlene [name] will follow up on compliance at next appointment. Intensive lifestyle modifications are the first line treatment for this issue. We discussed several lifestyle modifications today and she will continue to work on diet, exercise and weight loss efforts. We will continue to monitor. Orders and follow up as documented in patient record.   ? ?6. Other hyperlipidemia ?Cardiovascular risk and specific lipid/LDL goals reviewed.  We will check FLP today. We discussed several lifestyle modifications today and Braeden will continue to work on diet, exercise and weight loss efforts. Orders and follow up as documented in patient record.  ? ?Counseling ?Intensive lifestyle modifications are the  first line treatment for this issue. ?Dietary changes: Increase soluble fiber. Decrease simple carbohydrates. ?Exercise changes: Moderate to vigorous-intensity aerobic activity 150 minutes per week if tolerated. ?Lipid-lowering medications: see documented in medical record. ? ?- Lipid Panel With LDL/HDL Ratio ? ?7. Bipolar affective disorder, remission status unspecified (Charlene [name]) ?Behavior modification techniques were discussed today to help Alektra deal with her emotional/non-hunger eating behaviors.  Orders and follow up as documented in patient record.  ? ?8. History of alcohol abuse ?We will follow up on alcohol consumption at next appointment. We will check [name] today.  ? ?- Comprehensive metabolic panel ?- Folate ? ?9. Binge eating disorder ?Quentella will follow up with psych at already scheduled appointment. Behavior modification techniques were discussed today to help Charl deal with her emotional/non-hunger eating behaviors.  Orders and follow up as documented in patient record.  ? ?10. Vitamin D deficiency ?Low Vitamin D level contributes to fatigue and are associated with obesity, breast, and colon cancer. We will refill prescription Vitamin D 50,000 IU every week at next appointment and Adrienn will follow-up for routine testing of Vitamin D, at least 2-3 times per year to avoid over-replacement. ? ?11. Depression screening ?  Jlynn had a positive depression screening. Depression is commonly associated with obesity and often results in emotional eating behaviors. We will monitor this closely and work on CBT to help improve the non-hunger eating patterns. Referral to Psychology may be required if no improvement is seen as she continues in our clinic.  ? ?12. Obesity with current BMI of 40.4 ?Natalea is currently in the action stage of change and her goal is to continue with weight loss efforts. I recommend Andrea begin the structured treatment plan as follows: ? ?She has agreed to the Category 4 Plan plus 200  calories. ? ?Exercise goals: No exercise has been prescribed at this time.  ? ?Behavioral modification strategies: increasing lean protein intake, meal planning and cooking strategies, keeping healthy foods in the

## 2021-04-04 NOTE — Telephone Encounter (Signed)
Dr.Ukleja 

## 2021-04-04 NOTE — Telephone Encounter (Signed)
Please advise 

## 2021-04-05 ENCOUNTER — Telehealth (HOSPITAL_BASED_OUTPATIENT_CLINIC_OR_DEPARTMENT_OTHER): Payer: Self-pay

## 2021-04-05 NOTE — Telephone Encounter (Signed)
FYI

## 2021-04-05 NOTE — Telephone Encounter (Signed)
Patient called back to let us know that she was seen at [facility] yesterday for bloodwork. She will be going in next week to see the doctor. She will let us know how things go. tbw ?

## 2021-04-05 NOTE — Telephone Encounter (Signed)
Called patient and LMOM at 12:33 asking if she has gotten an appointment with Theodis Shove MD. Advised patient to shoot me a message via MyChart or give me a call at the office and leave voicemail if I am unavailable. tbe ?

## 2021-04-12 ENCOUNTER — Encounter (INDEPENDENT_AMBULATORY_CARE_PROVIDER_SITE_OTHER): Payer: Self-pay | Admitting: Family Medicine

## 2021-04-12 ENCOUNTER — Other Ambulatory Visit: Payer: Self-pay

## 2021-04-12 ENCOUNTER — Ambulatory Visit (INDEPENDENT_AMBULATORY_CARE_PROVIDER_SITE_OTHER): Payer: 59 | Admitting: Family Medicine

## 2021-04-12 VITALS — BP 135/82 | HR 110 | Temp 98.5°F | Ht 66.0 in | Wt 250.0 lb

## 2021-04-12 DIAGNOSIS — F1010 Alcohol abuse, uncomplicated: Secondary | ICD-10-CM | POA: Diagnosis not present

## 2021-04-12 DIAGNOSIS — F319 Bipolar disorder, unspecified: Secondary | ICD-10-CM | POA: Diagnosis not present

## 2021-04-12 DIAGNOSIS — E7849 Other hyperlipidemia: Secondary | ICD-10-CM

## 2021-04-12 DIAGNOSIS — R7401 Elevation of levels of liver transaminase levels: Secondary | ICD-10-CM

## 2021-04-12 DIAGNOSIS — E559 Vitamin D deficiency, unspecified: Secondary | ICD-10-CM

## 2021-04-12 DIAGNOSIS — Z9189 Other specified personal risk factors, not elsewhere classified: Secondary | ICD-10-CM | POA: Diagnosis not present

## 2021-04-12 DIAGNOSIS — E8881 Metabolic syndrome: Secondary | ICD-10-CM

## 2021-04-12 MED ORDER — ROSUVASTATIN CALCIUM 10 MG PO TABS: 10.0000 mg | ORAL_TABLET | Freq: Every day | ORAL | 0 refills | Status: DC

## 2021-04-12 MED ORDER — VITAMIN D (ERGOCALCIFEROL) 1.25 MG (50000 UNIT) PO CAPS: 50000.0000 [IU] | ORAL_CAPSULE | ORAL | 0 refills | Status: DC

## 2021-04-13 ENCOUNTER — Ambulatory Visit: Payer: 59 | Admitting: Pulmonary Disease

## 2021-04-13 ENCOUNTER — Other Ambulatory Visit: Payer: Self-pay | Admitting: Acute Care

## 2021-04-13 DIAGNOSIS — G35 Multiple sclerosis: Secondary | ICD-10-CM

## 2021-04-18 NOTE — Progress Notes (Signed)
? ? ? ?Chief Complaint:  ? ?OBESITY ?Charlene [name] is here to discuss her progress with her obesity treatment plan along with follow-up of her obesity related diagnoses. Charlene [name] is on the Category 4 Plan + 200 calories and states she is following her eating plan approximately 90-95%% of the time. Charlene [name] states she is doing 0 exercise for 0 minutes 0 times a week. ? ?Today's visit was #: 2 ?Starting weight: 250 lbs ?Starting date: 03/29/2021 ?Today's weight: 250 lbs ?Today's date: 04/12/2021 ?Total lbs lost to date: 0 ?Total lbs lost since last in-office visit: 0 ? ?Interim History: Charlene [name] took 1st week to get all food in her house and then started plan 2nd week. She wants to get more creative in food prep options, using snacks calories for alcohol. Charlene [name] did just eat breakfast in route to this appointment. She was making separate dinner regular for husband and herself. ? ?Subjective:  ? ?1. Transaminitis ?Charlene [name]'s LFT's are elevated, ALT at 70, AST at 62. She has had consistent alcohol use. ? ?2. Alcohol abuse ?Charlene [name] has naltrexone, she is still drinking but staying in allotted calories. ? ?3. Bipolar affective disorder, remission status unspecified (HCC) ?Charlene [name] was on Jordan and Trintellix previously. She now is on Lamictal (sexual side effects on above) has psychiatry and therapist. ? ?4. Other hyperlipidemia ?Charlene [name] was previously on Crestor, stopped due to refill running out. ? ?5. Vitamin D deficiency ?Charlene [name]'s Vit D was 32 on 01/15/2021. She notes fatigue. ? ?6. Insulin resistance ?Charlene [name]'s A1C at 5.4, Insulin at 15.6, not on any medications. (However on appetite suppressant Vyvanse). ? ?7. At risk for impaired function of liver ?Charlene [name] is at increased risk for impaired function of liver due to current nutrition and muscle mass. ? ? ?Assessment/Plan:  ? ?1. Transaminitis ?Charlene [name] will follow up with labs in 3 months. ? ?2. Alcohol abuse ?Charlene [name] has been counseled on cessation. She will follow up on intake at next  appointment. ? ?3. Bipolar affective disorder, remission status unspecified (HCC) ?Charlene [name] will follow up with psych next week. ? ?4. Other hyperlipidemia ?Charlene [name] will restart Crestor. ? ?-Refill rosuvastatin (CRESTOR) 10 MG tablet; Take 1 tablet (10 mg total) by mouth daily.  Dispense: 90 tablet; Refill: 0 ? ?5. Vitamin D deficiency ?Charlene [name] will restart Vit D 50k for 1 month with no refills. ? ?- Refill Vitamin D, Ergocalciferol, (DRISDOL) 1.25 MG (50000 UNIT) CAPS capsule; Take 1 capsule (50,000 Units total) by mouth every 7 (seven) days.  Dispense: 4 capsule; Refill: 0 ? ?6. Insulin resistance ?Charlene [name] will continue on meal plan and repeat labs in 3 months. ? ?7. At risk for impaired function of liver ?Charlene [name] was given approximately 15 minutes of counseling today regarding prevention of impaired liver function. Charlene [name] was educated about her risk of developing NASH or even liver failure and advised that the only proven treatment for NAFLD was weight loss of at least 5-10% of body weight.  ? ?Charlene [name] is currently in the action stage of change. As such, her goal is to continue with weight loss efforts. She has agreed to the Category 4 Plan+ 200 calories. ? ?Exercise goals: No exercise has been prescribed at this time. ? ?Behavioral modification strategies: increasing lean protein intake, meal planning and cooking strategies, keeping healthy foods in the home, and planning for success. ? ?Charlene [name] has agreed to follow-up with our clinic in 2 weeks. She was informed of the importance of frequent follow-up visits to maximize her success with intensive lifestyle modifications for her multiple  health conditions.  ? ?Objective:  ? ?Blood pressure 135/82, pulse (!) 110, temperature 98.5 ?F (36.9 ?C), height 5\' 6"  (1.676 m), weight 250 lb (113.4 kg), SpO2 97 %. ?Body mass index is 40.35 kg/m?. ? ?General: Cooperative, alert, well developed, in no acute distress. ?HEENT: Conjunctivae and lids unremarkable. ?Cardiovascular: Regular  rhythm.  ?Lungs: Normal work of breathing. ?Neurologic: No focal deficits.  ? ?Lab Results  ?Component Value Date  ? CREATININE 0.67 03/29/2021  ? BUN 8 03/29/2021  ? NA 140 03/29/2021  ? K 4.5 03/29/2021  ? CL 99 03/29/2021  ? CO2 23 03/29/2021  ? ?Lab Results  ?Component Value Date  ? ALT 70 (H) 03/29/2021  ? AST 62 (H) 03/29/2021  ? ALKPHOS 77 03/29/2021  ? BILITOT 0.2 03/29/2021  ? ?Lab Results  ?Component Value Date  ? HGBA1C 5.4 03/29/2021  ? HGBA1C 4.9 05/05/2018  ? ?Lab Results  ?Component Value Date  ? INSULIN 15.6 03/29/2021  ? ?Lab Results  ?Component Value Date  ? TSH 1.480 03/29/2021  ? ?Lab Results  ?Component Value Date  ? CHOL 317 (H) 03/29/2021  ? HDL 40 03/29/2021  ? LDLCALC 198 (H) 03/29/2021  ? LDLDIRECT 236.0 12/22/2019  ? TRIG 385 (H) 03/29/2021  ? CHOLHDL 3 06/17/2020  ? ?Lab Results  ?Component Value Date  ? VD25OH 19.20 (L) 06/17/2020  ? VD25OH 35 09/05/2009  ? VD25OH 9.0 02/20/2008  ? ?Lab Results  ?Component Value Date  ? WBC 8.6 06/17/2020  ? HGB 12.0 06/17/2020  ? HCT 34.5 (L) 06/17/2020  ? MCV 96.2 06/17/2020  ? PLT 315.0 06/17/2020  ? ?No results found for: IRON, TIBC, FERRITIN ? ? ?Attestation Statements:  ? ?Reviewed by clinician on day of visit: allergies, medications, problem list, medical history, surgical history, family history, social history, and previous encounter notes. ? ?I, Brendell Tyus, am acting as transcriptionist for 08/17/2020, MD. ? ?I have reviewed the above documentation for accuracy and completeness, and I agree with the above. Reuben Likes, MD ? ? ?

## 2021-05-01 ENCOUNTER — Encounter (INDEPENDENT_AMBULATORY_CARE_PROVIDER_SITE_OTHER): Payer: Self-pay | Admitting: Family Medicine

## 2021-05-01 ENCOUNTER — Ambulatory Visit (INDEPENDENT_AMBULATORY_CARE_PROVIDER_SITE_OTHER): Payer: 59 | Admitting: Family Medicine

## 2021-05-01 VITALS — BP 127/83 | HR 99 | Temp 98.2°F | Ht 66.0 in | Wt 255.0 lb

## 2021-05-01 DIAGNOSIS — Z6841 Body Mass Index (BMI) 40.0 and over, adult: Secondary | ICD-10-CM | POA: Diagnosis not present

## 2021-05-01 DIAGNOSIS — E559 Vitamin D deficiency, unspecified: Secondary | ICD-10-CM

## 2021-05-01 DIAGNOSIS — E669 Obesity, unspecified: Secondary | ICD-10-CM | POA: Diagnosis not present

## 2021-05-01 DIAGNOSIS — E7849 Other hyperlipidemia: Secondary | ICD-10-CM | POA: Diagnosis not present

## 2021-05-01 MED ORDER — VITAMIN D (ERGOCALCIFEROL) 1.25 MG (50000 UNIT) PO CAPS: 50000.0000 [IU] | ORAL_CAPSULE | ORAL | 0 refills | Status: DC

## 2021-05-02 ENCOUNTER — Encounter: Payer: Self-pay | Admitting: Pulmonary Disease

## 2021-05-02 ENCOUNTER — Ambulatory Visit (INDEPENDENT_AMBULATORY_CARE_PROVIDER_SITE_OTHER): Payer: 59 | Admitting: Pulmonary Disease

## 2021-05-02 VITALS — BP 116/76 | HR 101 | Temp 98.3°F | Ht 66.0 in | Wt 257.0 lb

## 2021-05-02 DIAGNOSIS — G4733 Obstructive sleep apnea (adult) (pediatric): Secondary | ICD-10-CM

## 2021-05-02 DIAGNOSIS — Z9989 Dependence on other enabling machines and devices: Secondary | ICD-10-CM | POA: Diagnosis not present

## 2021-05-02 NOTE — Progress Notes (Signed)
? ?      ?Charlene [name]    [id number]    March 18, 1980 ? ?Primary Care Physician:Tabori, Helane Rima, MD ? ?Referring Physician: [name], MD ?[address] ?[place],  [place] [address] ? ?Chief complaint:   ?Follow-up for moderate obstructive sleep apnea ? ?HPI: ? ?Still adjusting to using CPAP on a regular basis ? ?Has no significant problems with the mask, no significant problems with the pressure ?Sometimes headgear moves around a lot ? ?Overall tolerated it okay but not logging it so far ? ?Daytime sleepiness is better however she is also had medications adjusted so it is unclear whether it is just CPAP treatment or the medications that help. ? ?Her health is remained stable ? ?Recently joined weight loss program ? ? ?Historically ?Usually goes to bed between 930 and 10:30 PM ?15 to 20 minutes to fall asleep ?Multiple awakenings ?final wake up time usually about 10 AM ?Usually wakes up about 6 AM to get at kids ready for school, will go back to laying in bed and finally try and get up by 10 ?Will usually use the snooze button multiple times before finally getting up ? ?Very easy for her to take a daytime nap ? ?Admits to dryness of mouth in the morning ?Occasional sore throat ?Occasional night sweats ?Sometimes wake up wakes up with a headache ? ?Dad snored ?Reformed smoker ? ? ?Outpatient Encounter Medications as of 05/02/2021  ?Medication Sig  ? ALPRAZolam (XANAX) 0.5 MG tablet Take 0.5 mg by mouth daily as needed.  ? amphetamine-dextroamphetamine (ADDERALL) 20 MG tablet Take 20 mg by mouth daily as needed.  ? Biotin 71062 MCG TABS Take 1 tablet by mouth daily.  ? calcium carbonate (TUMS EX) 750 MG chewable tablet Chew 1 tablet by mouth daily.  ? clobetasol (TEMOVATE) 0.05 % external solution Apply 1 application topically 2 (two) times daily.  ? clonazePAM (KLONOPIN) 1 MG tablet Take 1 mg by mouth at bedtime as needed.  ? Docusate Sodium 100 MG capsule Take 100 mg by mouth 2 (two) times daily.  ?  ibuprofen (ADVIL) 200 MG tablet Take 200 mg by mouth every 6 (six) hours as needed.  ? lamoTRIgine (LAMICTAL) 25 MG tablet Take 25 mg by mouth daily.  ? lisdexamfetamine (VYVANSE) 50 MG capsule Take 50 mg by mouth daily.  ? Multiple Vitamin (MULTIVITAMIN) tablet Take 1 tablet by mouth daily.  ? naltrexone (DEPADE) 50 MG tablet Take 50 mg by mouth daily.  ? ocrelizumab (OCREVUS) 300 MG/10ML injection See admin instructions.  ? rosuvastatin (CRESTOR) 10 MG tablet Take 1 tablet (10 mg total) by mouth daily.  ? SUMAtriptan (IMITREX) 5 MG/ACT nasal spray Place 1 spray into the nose as needed for migraine.  ? ustekinumab (STELARA) 90 MG/ML SOSY injection See admin instructions.  ? Vitamin D, Ergocalciferol, (DRISDOL) 1.25 MG (50000 UNIT) CAPS capsule Take 1 capsule (50,000 Units total) by mouth every 7 (seven) days.  ? zolmitriptan (ZOMIG) 5 MG tablet 4mg  as needed  ? [DISCONTINUED] ondansetron (ZOFRAN) 4 MG tablet Take 1 tablet (4 mg total) by mouth every 8 (eight) hours as needed for nausea or vomiting. (Patient not taking: Reported on 05/02/2021)  ? ?No facility-administered encounter medications on file as of 05/02/2021.  ? ? ?Allergies as of 05/02/2021 - Review Complete 05/02/2021  ?Allergen Reaction Noted  ? Ciprofloxacin Other (See Comments) 02/11/2016  ? Prednisone Other (See Comments) 02/11/2016  ? African mango [irvingia gabonensis] Other (See Comments) 03/29/2021  ? ? ?  Past Medical History:  ?Diagnosis Date  ? Alcoholism (HCC)   ? Anxiety   ? Anxiety and depression   ? Bipolar 2 disorder (HCC)   ? Borderline personality disorder (HCC)   ? Chest pain   ? Chronic fatigue syndrome   ? Claustrophobia   ? on meds  ? Constipation   ? Depression   ? on meds  ? Elevated blood sugar 06/08/2014  ? Elevated LFTs   ? Fatigue   ? Fatty liver 10/2014  ? GERD (gastroesophageal reflux disease)   ? diet related  ? Heart murmur   ? Hyperlipidemia   ? on meds  ? Infertility associated with anovulation   ? Joint pain   ? Low grade  squamous intraepithelial lesion (LGSIL) on Pap smear 10/30/2011  ? Major depressive disorder   ? Migraines   ? MS (multiple sclerosis) (HCC)   ? Neuromuscular disorder (HCC)   ? Obesity   ? Obesity in pregnancy, antepartum   ? PCOS (polycystic ovarian syndrome)   ? Personal history of pre-term labor   ? Prediabetes   ? Pregnancy induced hypertension   ? pregnancy related- hx of  ? Psoriasis   ? Sleep apnea   ? SOB (shortness of breath)   ? Vitamin D deficiency   ? ? ?Past Surgical History:  ?Procedure Laterality Date  ? COLONOSCOPY  10/17/2020  ? DILATION AND CURETTAGE OF UTERUS  08/15/2008  ? DILATION AND EVACUATION  12/29/2011  ? Procedure: DILATATION AND EVACUATION;  Surgeon: Catalina Antigua, MD;  Location: [facility];  Service: Gynecology;;  Dr, Emelda Fear transferred case to Dr. Jolayne Panther  ? LAPAROSCOPIC GASTRIC SLEEVE RESECTION N/A 12/07/2014  ? Procedure: LAPAROSCOPIC GASTRIC SLEEVE RESECTION;  Surgeon: Ovidio Kin, MD;  Location: [facility];  Service: General;  Laterality: N/A;  ? ? ?Family History  ?Problem Relation Age of Onset  ? Hypertension Mother   ? Hyperlipidemia Mother   ? Mental illness Mother   ? Dementia Mother   ?     early onset  ? Depression Mother   ? Anxiety disorder Mother   ? Drug abuse Mother   ? Eating disorder Mother   ? Alcoholism Father   ? Heart disease Father   ? Alcoholism Maternal Grandmother   ? Mental illness Maternal Grandmother   ? Esophageal cancer Maternal Grandmother   ? Heart attack Maternal Grandfather   ? Colon cancer Neg Hx   ? Stomach cancer Neg Hx   ? Colon polyps Neg Hx   ? Rectal cancer Neg Hx   ? ? ?Social History  ? ?Socioeconomic History  ? Marital status: Married  ?  Spouse name: Not on file  ? Number of children: Not on file  ? Years of education: Not on file  ? Highest education level: Not on file  ?Occupational History  ? Occupation: Print production planner  ? Occupation: stay at home mom, part time daycare  ?Tobacco Use  ? Smoking status: Former  ?  Packs/day: 1.00  ?  Years:  15.00  ?  Pack years: 15.00  ?  Types: Cigarettes  ?  Quit date: 07/02/2010  ?  Years since quitting: 10.8  ? Smokeless tobacco: Never  ?Vaping Use  ? Vaping Use: Never used  ?Substance and Sexual Activity  ? Alcohol use: Yes  ?  Comment: alcoholic - so she either does not drink at all or she over drinks  ? Drug use: No  ? Sexual activity: Yes  ?  Partners: Male  ?  Birth control/protection: None  ?  Comment: vasectomy  ?Other Topics Concern  ? Not on file  ?Social History Narrative  ? Work or School: homemaker  ?   ? Home Situation: lives with husband, 2 twins (2.6 yo) and 30 month old in 2016  ?   ? Spiritual Beliefs: none  ?   ? Lifestyle: no regular exercise, poor diet  ?   ? ?Social Determinants of Health  ? ?Financial Resource Strain: Not on file  ?Food Insecurity: Not on file  ?Transportation Needs: Not on file  ?Physical Activity: Not on file  ?Stress: Not on file  ?Social Connections: Not on file  ?Intimate Partner Violence: Not on file  ? ? ?Review of Systems  ?Constitutional:  Positive for fatigue.  ?Psychiatric/Behavioral:  Positive for sleep disturbance.   ? ?Vitals:  ? 05/02/21 1109  ?BP: 116/76  ?Pulse: (!) 101  ?Temp: 98.3 ?F (36.8 ?C)  ?SpO2: 98%  ? ? ? ?Physical Exam ?Constitutional:   ?   Appearance: She is obese.  ?HENT:  ?   Mouth/Throat:  ?   Mouth: Mucous membranes are moist.  ?   Comments: Moist oral mucosa, crowded oropharynx, Mallampati 4 ?Eyes:  ?   Pupils: Pupils are equal, round, and reactive to light.  ?Cardiovascular:  ?   Rate and Rhythm: Normal rate and regular rhythm.  ?   Heart sounds: No murmur heard. ?  No friction rub.  ?Pulmonary:  ?   Effort: No respiratory distress.  ?   Breath sounds: No stridor. No wheezing or rhonchi.  ?Musculoskeletal:  ?   Cervical back: No rigidity or tenderness.  ?Neurological:  ?   Mental Status: She is alert.  ?Psychiatric:     ?   Mood and Affect: Mood normal.  ? ? ?  11/10/2020  ? 10:00 AM  ?Results of the Epworth flowsheet  ?Sitting and reading 2   ?Watching TV 2  ?Sitting, inactive in a public place (e.g. a theatre or a meeting) 0  ?As a passenger in a car for an hour without a break 0  ?Lying down to rest in the afternoon when circumstances perm

## 2021-05-02 NOTE — Patient Instructions (Signed)
Continue CPAP ? ?Weight loss efforts as tolerated ? ?Your download shows that CPAP is helping, treating the events ? ?I will see you in 6 months ? ?Call with significant concerns ?

## 2021-05-13 NOTE — Progress Notes (Signed)
Chief Complaint:   OBESITY Charlene [name] is here to discuss her progress with her obesity treatment plan along with follow-up of her obesity related diagnoses. Charlene [name] is on the Category 4 Plan + 200 calories and states she is following her eating plan approximately 0% of the time. Charlene [name] states she is not exercising.  Today's visit was #: 3 Starting weight: 250 lbs Starting date: 03/29/2021 Today's weight: 255 lbs Today's date: 05/01/2021 Total lbs lost to date: 0 Total lbs lost since last in-office visit: 0  Interim History: Charlene [name] has been on vacation for most of the time since her last appointment. She went camping for the weekend and then drove to [place] and stayed with her aunt. Charlene [name] was eating out frequently while away. She has 1 trip per month until October, but will be at home for the next 4 months.  Subjective:   1. Vitamin D deficiency Charlene [name] is on prescription vitamin D. She notes fatigue and denies nausea, vomiting, and muscle weakness.  2. Other hyperlipidemia Charlene [name] has a 90 day prescription of Crestor. She denies myalgias.  Assessment/Plan:   1. Vitamin D deficiency Charlene [name] agrees to continue taking prescription vitamin D and will follow up at the agreed upon time.  - Vitamin D, Ergocalciferol, (DRISDOL) 1.25 MG (50000 UNIT) CAPS capsule; Take 1 capsule (50,000 Units total) by mouth every 7 (seven) days.  Dispense: 4 capsule; Refill: 0  2. Other hyperlipidemia Charlene [name] agrees to continue taking Crestor with no refills needed or changes.  3. Obesity with current BMI of 41.2 Charlene [name] is currently in the action stage of change. As such, her goal is to continue with weight loss efforts. She has agreed to the Category 4 Plan + 200 calories.  Exercise goals: No exercise has been prescribed at this time.  Behavioral modification strategies: increasing lean protein intake, meal planning and cooking strategies, keeping healthy foods in the home, travel eating strategies, and planning for  success.  Charlene [name] has agreed to follow-up with our clinic in 3 weeks. She was informed of the importance of frequent follow-up visits to maximize her success with intensive lifestyle modifications for her multiple health conditions.   Objective:   Blood pressure 127/83, pulse 99, temperature 98.2 F (36.8 C), height 5\' 6"  (1.676 m), weight 255 lb (115.7 kg), last menstrual period 04/22/2021, SpO2 98 %. Body mass index is 41.16 kg/m.  General: Cooperative, alert, well developed, in no acute distress. HEENT: Conjunctivae and lids unremarkable. Cardiovascular: Regular rhythm.  Lungs: Normal work of breathing. Neurologic: No focal deficits.   Lab Results  Component Value Date   CREATININE 0.67 03/29/2021   BUN 8 03/29/2021   NA 140 03/29/2021   K 4.5 03/29/2021   CL 99 03/29/2021   CO2 23 03/29/2021   Lab Results  Component Value Date   ALT 70 (H) 03/29/2021   AST 62 (H) 03/29/2021   ALKPHOS 77 03/29/2021   BILITOT 0.2 03/29/2021   Lab Results  Component Value Date   HGBA1C 5.4 03/29/2021   HGBA1C 4.9 05/05/2018   Lab Results  Component Value Date   INSULIN 15.6 03/29/2021   Lab Results  Component Value Date   TSH 1.480 03/29/2021   Lab Results  Component Value Date   CHOL 317 (H) 03/29/2021   HDL 40 03/29/2021   LDLCALC 198 (H) 03/29/2021   LDLDIRECT 236.0 12/22/2019   TRIG 385 (H) 03/29/2021   CHOLHDL 3 06/17/2020   Lab Results  Component Value Date  VD25OH 19.20 (L) 06/17/2020   VD25OH 35 09/05/2009   VD25OH 9.0 02/20/2008   Lab Results  Component Value Date   WBC 8.6 06/17/2020   HGB 12.0 06/17/2020   HCT 34.5 (L) 06/17/2020   MCV 96.2 06/17/2020   PLT 315.0 06/17/2020   No results found for: IRON, TIBC, FERRITIN  Attestation Statements:   Reviewed by clinician on day of visit: allergies, medications, problem list, medical history, surgical history, family history, social history, and previous encounter notes.  IKirke Corin, CMA, am acting  as transcriptionist for Reuben Likes, MD  I have reviewed the above documentation for accuracy and completeness, and I agree with the above. - Reuben Likes, MD

## 2021-05-22 ENCOUNTER — Ambulatory Visit
Admission: RE | Admit: 2021-05-22 | Discharge: 2021-05-22 | Disposition: A | Discharge status: Home / Self Care | Payer: 59 | Source: Ambulatory Visit | Attending: Acute Care | Admitting: Acute Care

## 2021-05-22 DIAGNOSIS — G35 Multiple sclerosis: Secondary | ICD-10-CM

## 2021-05-22 MED ORDER — GADOBENATE DIMEGLUMINE 529 MG/ML IV SOLN
20.0000 mL | Freq: Once | INTRAVENOUS | Status: AC | PRN
  Administered 2021-05-22: 20 mL via INTRAVENOUS

## 2021-05-24 ENCOUNTER — Encounter (INDEPENDENT_AMBULATORY_CARE_PROVIDER_SITE_OTHER): Payer: Self-pay | Admitting: Family Medicine

## 2021-05-24 ENCOUNTER — Telehealth (INDEPENDENT_AMBULATORY_CARE_PROVIDER_SITE_OTHER): Payer: 59 | Admitting: Family Medicine

## 2021-05-24 DIAGNOSIS — E8881 Metabolic syndrome: Secondary | ICD-10-CM

## 2021-05-24 DIAGNOSIS — E669 Obesity, unspecified: Secondary | ICD-10-CM | POA: Diagnosis not present

## 2021-05-24 DIAGNOSIS — E7849 Other hyperlipidemia: Secondary | ICD-10-CM

## 2021-05-24 DIAGNOSIS — Z6841 Body Mass Index (BMI) 40.0 and over, adult: Secondary | ICD-10-CM | POA: Diagnosis not present

## 2021-05-25 ENCOUNTER — Encounter (INDEPENDENT_AMBULATORY_CARE_PROVIDER_SITE_OTHER): Payer: Self-pay | Admitting: Family Medicine

## 2021-05-31 NOTE — Progress Notes (Signed)
TeleHealth Visit:  Due to the COVID-19 pandemic, this visit was completed with telemedicine (audio/video) technology to reduce patient and provider exposure as well as to preserve personal protective equipment.   Charlene [name] has verbally consented to this TeleHealth visit. The patient is located at home, the provider is located at home. The participants in this visit include the listed provider and patient. The visit was conducted today via Mychart video.  Chief Complaint: OBESITY Charlene [name] is here to discuss her progress with her obesity treatment plan along with follow-up of her obesity related diagnoses. Charlene [name] is on the Category 4 Plan + 200 and states she is following her eating plan approximately 90% of the time. Charlene [name] states she is exercising 0 minutes 0 times per week.  Today's visit was #: 4 Starting weight: 250 lbs Starting date: 03/29/2021  Interim History: Charlene [name] had a MRI for assessment of MS; found to have mastoid effusion. She has to go to ENT tomorrow. She reports loss of 6 lbs since last appointment. Does think she's been very compliant on monitoring calories and protein. She does notice an increase in intake at the time of her period. Charlene [name] is going to [place] next week for 11 days.  Subjective:   1. Other hyperlipidemia Charlene [name] is noticing  significant soreness on Rosuvastatin and increased back pain.  2. Insulin resistance A1c of 5.4, insulin of 15.6. She has some increased drive for carbs, (especially on period).  Assessment/Plan:   1. Other hyperlipidemia Stop Rosuvastatin.  2. Insulin resistance Charlene [name] will have labs in 3 months.  3. Obesity with current BMI of 41.2 Charlene [name] is currently in the action stage of change. As such, her goal is to continue with weight loss efforts. She has agreed to the Category 4 Plan and keeping a food journal and adhering to recommended goals of 2000-2200 calories and 150+ grams of protein daily  Exercise goals: All adults should avoid  inactivity. Some physical activity is better than none, and adults who participate in any amount of physical activity gain some health benefits.  Behavioral modification strategies: increasing lean protein intake, meal planning and cooking strategies, keeping healthy foods in the home, travel eating strategies, and planning for success.  Charlene [name] has agreed to follow-up with our clinic in 3 weeks. She was informed of the importance of frequent follow-up visits to maximize her success with intensive lifestyle modifications for her multiple health conditions.  Objective:   VITALS: Per patient if applicable, see vitals. GENERAL: Alert and in no acute distress. CARDIOPULMONARY: No increased WOB. Speaking in clear sentences.  PSYCH: Pleasant and cooperative. Speech normal rate and rhythm. Affect is appropriate. Insight and judgement are appropriate. Attention is focused, linear, and appropriate.  NEURO: Oriented as arrived to appointment on time with no prompting.   Lab Results  Component Value Date   CREATININE 0.67 03/29/2021   BUN 8 03/29/2021   NA 140 03/29/2021   K 4.5 03/29/2021   CL 99 03/29/2021   CO2 23 03/29/2021   Lab Results  Component Value Date   ALT 70 (H) 03/29/2021   AST 62 (H) 03/29/2021   ALKPHOS 77 03/29/2021   BILITOT 0.2 03/29/2021   Lab Results  Component Value Date   HGBA1C 5.4 03/29/2021   HGBA1C 4.9 05/05/2018   Lab Results  Component Value Date   INSULIN 15.6 03/29/2021   Lab Results  Component Value Date   TSH 1.480 03/29/2021   Lab Results  Component Value Date   CHOL 317 (  H) 03/29/2021   HDL 40 03/29/2021   LDLCALC 198 (H) 03/29/2021   LDLDIRECT 236.0 12/22/2019   TRIG 385 (H) 03/29/2021   CHOLHDL 3 06/17/2020   Lab Results  Component Value Date   VD25OH 19.20 (L) 06/17/2020   VD25OH 35 09/05/2009   VD25OH 9.0 02/20/2008   Lab Results  Component Value Date   WBC 8.6 06/17/2020   HGB 12.0 06/17/2020   HCT 34.5 (L) 06/17/2020   MCV  96.2 06/17/2020   PLT 315.0 06/17/2020   No results found for: IRON, TIBC, FERRITIN  Attestation Statements:   Reviewed by clinician on day of visit: allergies, medications, problem list, medical history, surgical history, family history, social history, and previous encounter notes.  I, Fortino Sic, RMA am acting as transcriptionist for Reuben Likes, MD.  I have reviewed the above documentation for accuracy and completeness, and I agree with the above. - Reuben Likes, MD

## 2021-06-14 ENCOUNTER — Other Ambulatory Visit (INDEPENDENT_AMBULATORY_CARE_PROVIDER_SITE_OTHER): Payer: Self-pay

## 2021-06-14 ENCOUNTER — Encounter (INDEPENDENT_AMBULATORY_CARE_PROVIDER_SITE_OTHER): Payer: Self-pay

## 2021-06-14 ENCOUNTER — Ambulatory Visit (INDEPENDENT_AMBULATORY_CARE_PROVIDER_SITE_OTHER): Payer: 59 | Admitting: Family Medicine

## 2021-06-14 ENCOUNTER — Encounter (INDEPENDENT_AMBULATORY_CARE_PROVIDER_SITE_OTHER): Payer: Self-pay | Admitting: Family Medicine

## 2021-06-14 DIAGNOSIS — E559 Vitamin D deficiency, unspecified: Secondary | ICD-10-CM

## 2021-06-14 MED ORDER — VITAMIN D (ERGOCALCIFEROL) 1.25 MG (50000 UNIT) PO CAPS: 50000.0000 [IU] | ORAL_CAPSULE | ORAL | 0 refills | Status: DC

## 2021-06-14 NOTE — Telephone Encounter (Signed)
Dr.Ukleja 

## 2021-06-26 ENCOUNTER — Encounter (INDEPENDENT_AMBULATORY_CARE_PROVIDER_SITE_OTHER): Payer: Self-pay | Admitting: Family Medicine

## 2021-06-26 ENCOUNTER — Ambulatory Visit (INDEPENDENT_AMBULATORY_CARE_PROVIDER_SITE_OTHER): Payer: 59 | Admitting: Family Medicine

## 2021-06-26 VITALS — BP 124/85 | HR 96 | Temp 98.7°F | Ht 66.0 in | Wt 243.0 lb

## 2021-06-26 DIAGNOSIS — Z6839 Body mass index (BMI) 39.0-39.9, adult: Secondary | ICD-10-CM

## 2021-06-26 DIAGNOSIS — E669 Obesity, unspecified: Secondary | ICD-10-CM

## 2021-06-26 DIAGNOSIS — E559 Vitamin D deficiency, unspecified: Secondary | ICD-10-CM | POA: Diagnosis not present

## 2021-06-26 DIAGNOSIS — Z9189 Other specified personal risk factors, not elsewhere classified: Secondary | ICD-10-CM

## 2021-06-26 MED ORDER — VITAMIN D (ERGOCALCIFEROL) 1.25 MG (50000 UNIT) PO CAPS: 50000.0000 [IU] | ORAL_CAPSULE | ORAL | 0 refills | Status: DC

## 2021-06-28 NOTE — Progress Notes (Unsigned)
Chief Complaint:   OBESITY Charlene [name] is here to discuss her progress with her obesity treatment plan along with follow-up of her obesity related diagnoses. Charlene [name] is on the Category 4 Plan and keeping a food journal and adhering to recommended goals of 2000-2200 calories and 150+ grams of protein and states she is following her eating plan approximately 90% of the time. Charlene [name] states she is exercising 0 minutes 0 times per week.  Today's visit was #: 4 Starting weight: 250 lbs Starting date: 03/29/2021 Today's weight: 243 lbs Today's date: 06/26/2021 Total lbs lost to date: 13 lbs Total lbs lost since last in-office visit: 12  Interim History: Charlene [name] is unsure if she is getting enough calories in daily. She wants to get a bit more disciplined with weighing and measuring. She is eating Vilma Meckel delight breakfast sandwich. She knows she is getting enough protein at dinner and close to it at lunch with [employer] and [employer]. She is going to the beach at the end of month to [facility].  Subjective:   1. Vitamin D deficiency Charlene [name] denies any nausea, vomiting or muscle weakness. She notes fatigue.  2. At risk for activity intolerance Charlene [name] is at risk of exercise intolerance due to obesity.  Assessment/Plan:   1. Vitamin D deficiency We will refill Vit D 50,000 IU once a week for 1 month with 0 refills.  -Refill Vitamin D, Ergocalciferol, (DRISDOL) 1.25 MG (50000 UNIT) CAPS capsule; Take 1 capsule (50,000 Units total) by mouth every 7 (seven) days.  Dispense: 12 capsule; Refill: 0  2. At risk for activity intolerance Charlene [name] was given approximately 15 minutes of exercise intolerance counseling today. She is 41 y.o. female and has risk factors exercise intolerance including obesity. We discussed intensive lifestyle modifications today with an emphasis on specific weight loss instructions and strategies. Charlene [name] will slowly increase activity as tolerated.  Repetitive spaced learning was  employed today to elicit superior memory formation and behavioral change.   3. Obesity with current BMI 39.3 Charlene [name] is currently in the action stage of change. As such, her goal is to continue with weight loss efforts. She has agreed to the Category 4 Plan and keeping a food journal and adhering to recommended goals of 2000-2200 calories and 150+ grams of  protein daily.   Exercise goals: Charlene [name] is to consider 10 minutes 3 times a week on exercise bike.  Behavioral modification strategies: increasing lean protein intake, meal planning and cooking strategies, keeping healthy foods in the home, and planning for success.  Charlene [name] has agreed to follow-up with our clinic in 3 weeks. She was informed of the importance of frequent follow-up visits to maximize her success with intensive lifestyle modifications for her multiple health conditions.   Objective:   Blood pressure 124/85, pulse 96, temperature 98.7 F (37.1 C), height 5\' 6"  (1.676 m), weight 243 lb (110.2 kg), SpO2 98 %. Body mass index is 39.22 kg/m.  General: Cooperative, alert, well developed, in no acute distress. HEENT: Conjunctivae and lids unremarkable. Cardiovascular: Regular rhythm.  Lungs: Normal work of breathing. Neurologic: No focal deficits.   Lab Results  Component Value Date   CREATININE 0.67 03/29/2021   BUN 8 03/29/2021   NA 140 03/29/2021   K 4.5 03/29/2021   CL 99 03/29/2021   CO2 23 03/29/2021   Lab Results  Component Value Date   ALT 70 (H) 03/29/2021   AST 62 (H) 03/29/2021   ALKPHOS 77 03/29/2021   BILITOT  0.2 03/29/2021   Lab Results  Component Value Date   HGBA1C 5.4 03/29/2021   HGBA1C 4.9 05/05/2018   Lab Results  Component Value Date   INSULIN 15.6 03/29/2021   Lab Results  Component Value Date   TSH 1.480 03/29/2021   Lab Results  Component Value Date   CHOL 317 (H) 03/29/2021   HDL 40 03/29/2021   LDLCALC 198 (H) 03/29/2021   LDLDIRECT 236.0 12/22/2019   TRIG 385 (H) 03/29/2021    CHOLHDL 3 06/17/2020   Lab Results  Component Value Date   VD25OH 19.20 (L) 06/17/2020   VD25OH 35 09/05/2009   VD25OH 9.0 02/20/2008   Lab Results  Component Value Date   WBC 8.6 06/17/2020   HGB 12.0 06/17/2020   HCT 34.5 (L) 06/17/2020   MCV 96.2 06/17/2020   PLT 315.0 06/17/2020   No results found for: "IRON", "TIBC", "FERRITIN"  Attestation Statements:   Reviewed by clinician on day of visit: allergies, medications, problem list, medical history, surgical history, family history, social history, and previous encounter notes.  I, Fortino Sic, RMA am acting as transcriptionist for Reuben Likes, MD.  I have reviewed the above documentation for accuracy and completeness, and I agree with the above. -  ***

## 2021-06-30 ENCOUNTER — Encounter (INDEPENDENT_AMBULATORY_CARE_PROVIDER_SITE_OTHER): Payer: Self-pay | Admitting: Family Medicine

## 2021-07-03 NOTE — Telephone Encounter (Signed)
Please advise 

## 2021-07-20 ENCOUNTER — Ambulatory Visit (INDEPENDENT_AMBULATORY_CARE_PROVIDER_SITE_OTHER): Payer: 59 | Admitting: Family Medicine

## 2021-07-20 ENCOUNTER — Other Ambulatory Visit (INDEPENDENT_AMBULATORY_CARE_PROVIDER_SITE_OTHER): Payer: Self-pay | Admitting: Family Medicine

## 2021-07-20 ENCOUNTER — Encounter (INDEPENDENT_AMBULATORY_CARE_PROVIDER_SITE_OTHER): Payer: Self-pay | Admitting: Family Medicine

## 2021-07-20 VITALS — BP 121/80 | HR 107 | Temp 98.7°F | Ht 66.0 in | Wt 244.0 lb

## 2021-07-20 DIAGNOSIS — Z6839 Body mass index (BMI) 39.0-39.9, adult: Secondary | ICD-10-CM | POA: Diagnosis not present

## 2021-07-20 DIAGNOSIS — E669 Obesity, unspecified: Secondary | ICD-10-CM | POA: Diagnosis not present

## 2021-07-20 DIAGNOSIS — E7849 Other hyperlipidemia: Secondary | ICD-10-CM

## 2021-07-20 DIAGNOSIS — E8881 Metabolic syndrome: Secondary | ICD-10-CM

## 2021-07-20 MED ORDER — ATORVASTATIN CALCIUM 20 MG PO TABS: 20.0000 mg | ORAL_TABLET | Freq: Every day | ORAL | 0 refills | Status: DC

## 2021-07-24 NOTE — Progress Notes (Signed)
Chief Complaint:   OBESITY Charlene [name] is here to discuss her progress with her obesity treatment plan along with follow-up of her obesity related diagnoses. Charlene [name] is on the Category 4 Plan and keeping a food journal and adhering to recommended goals of 2000-2200 calories and 150+ grams of protein and states she is following her eating plan approximately 90% of the time. Charlene [name] states she is exercising 0 minutes 0 times per week.  Today's visit was #: 5 Starting weight: 250 lbs Starting date: 03/29/2021 Today's weight: 244 lbs Today's date: 07/20/2021 Total lbs lost to date: 6 lbs Total lbs lost since last in-office visit: 0  Interim History: Charlene [name] had a MS relapse and got 6hr infusion yesterday. She has had some feeling of increased adrenaline. She has been very mindful of food choices. A few select indulgences while away on vacation. She's trying to stick to 2000 calories and always hitting protein target. No up coming plans except cruise to [place] for 1st 2 weeks in August.  Subjective:   1. Other hyperlipidemia Charlene [name] was on Rosuvastatin previously but unfortunately had significant myalgias and joint pain.  2. Insulin resistance Charlene [name] is doing well with logging. Minimal carb cravings.   Assessment/Plan:   1. Other hyperlipidemia START Lipitor 20 by mouth daily for 1 month with 0 refills.  -Start atorvastatin (LIPITOR) 20 MG tablet; Take 1 tablet (20 mg total) by mouth daily.  Dispense: 30 tablet; Refill: 0  2. Insulin resistance We will obtain labs in Sept.  3. Obesity with current BMI of 39.5 Charlene [name] is currently in the action stage of change. As such, her goal is to continue with weight loss efforts. She has agreed to the Category 4 Plan and keeping a food journal and adhering to recommended goals of 2000-2200 calories and 150+ grams of protein daily.  Exercise goals: All adults should avoid inactivity. Some physical activity is better than none, and adults who participate in  any amount of physical activity gain some health benefits.  Behavioral modification strategies: increasing lean protein intake, meal planning and cooking strategies, keeping healthy foods in the home, and planning for success.  Charlene [name] has agreed to follow-up with our clinic in 3 weeks. She was informed of the importance of frequent follow-up visits to maximize her success with intensive lifestyle modifications for her multiple health conditions.   Objective:   Blood pressure 121/80, pulse (!) 107, temperature 98.7 F (37.1 C), height 5\' 6"  (1.676 m), weight 244 lb (110.7 kg), last menstrual period 07/17/2021, SpO2 99 %. Body mass index is 39.38 kg/m.  General: Cooperative, alert, well developed, in no acute distress. HEENT: Conjunctivae and lids unremarkable. Cardiovascular: Regular rhythm.  Lungs: Normal work of breathing. Neurologic: No focal deficits.   Lab Results  Component Value Date   CREATININE 0.67 03/29/2021   BUN 8 03/29/2021   NA 140 03/29/2021   K 4.5 03/29/2021   CL 99 03/29/2021   CO2 23 03/29/2021   Lab Results  Component Value Date   ALT 70 (H) 03/29/2021   AST 62 (H) 03/29/2021   ALKPHOS 77 03/29/2021   BILITOT 0.2 03/29/2021   Lab Results  Component Value Date   HGBA1C 5.4 03/29/2021   HGBA1C 4.9 05/05/2018   Lab Results  Component Value Date   INSULIN 15.6 03/29/2021   Lab Results  Component Value Date   TSH 1.480 03/29/2021   Lab Results  Component Value Date   CHOL 317 (H) 03/29/2021   HDL 40  03/29/2021   LDLCALC 198 (H) 03/29/2021   LDLDIRECT 236.0 12/22/2019   TRIG 385 (H) 03/29/2021   CHOLHDL 3 06/17/2020   Lab Results  Component Value Date   VD25OH 19.20 (L) 06/17/2020   VD25OH 35 09/05/2009   VD25OH 9.0 02/20/2008   Lab Results  Component Value Date   WBC 8.6 06/17/2020   HGB 12.0 06/17/2020   HCT 34.5 (L) 06/17/2020   MCV 96.2 06/17/2020   PLT 315.0 06/17/2020   No results found for: "IRON", "TIBC",  "FERRITIN"  Attestation Statements:   Reviewed by clinician on day of visit: allergies, medications, problem list, medical history, surgical history, family history, social history, and previous encounter notes.  I, Fortino Sic, RMA am acting as transcriptionist for Reuben Likes, MD.  I have reviewed the above documentation for accuracy and completeness, and I agree with the above. - Reuben Likes, MD

## 2021-08-09 ENCOUNTER — Ambulatory Visit (INDEPENDENT_AMBULATORY_CARE_PROVIDER_SITE_OTHER): Payer: 59 | Admitting: Family Medicine

## 2021-08-09 ENCOUNTER — Encounter (INDEPENDENT_AMBULATORY_CARE_PROVIDER_SITE_OTHER): Payer: Self-pay | Admitting: Family Medicine

## 2021-08-09 VITALS — BP 121/78 | HR 65 | Temp 98.4°F | Ht 66.0 in | Wt 242.0 lb

## 2021-08-09 DIAGNOSIS — E669 Obesity, unspecified: Secondary | ICD-10-CM | POA: Diagnosis not present

## 2021-08-09 DIAGNOSIS — Z6839 Body mass index (BMI) 39.0-39.9, adult: Secondary | ICD-10-CM

## 2021-08-09 DIAGNOSIS — E7849 Other hyperlipidemia: Secondary | ICD-10-CM | POA: Diagnosis not present

## 2021-08-09 MED ORDER — ATORVASTATIN CALCIUM 20 MG PO TABS: 20.0000 mg | ORAL_TABLET | Freq: Every day | ORAL | 0 refills | Status: DC

## 2021-08-16 NOTE — Progress Notes (Unsigned)
Chief Complaint:   OBESITY Charlene [name] is here to discuss her progress with her obesity treatment plan along with follow-up of her obesity related diagnoses. Charlene [name] is on the Category 4 Plan or keeping a food journal and adhering to recommended goals of 2000-2200 calories and 150+ grams of protein daily and states she is following her eating plan approximately 80% of the time. Charlene [name] states she is doing 0 minutes 0 times per week.  Today's visit was #: 6 Starting weight: 250 lbs Starting date: 03/29/2021 Today's weight: 242 lbs Today's date: 08/09/2021 Total lbs lost to date: 8 Total lbs lost since last in-office visit: 2  Interim History: Charlene [name] continues to do well with weight loss.  She is journaling regularly, and she is doing well with increasing her protein.  Her hunger is mostly controlled and she is minimizing her snacking.  She is still retaining some fluid however.  Subjective:   1. Other hyperlipidemia Charlene [name] is on Lipitor 20 mg, with no chest pain or myalgias noted.  She is doing well with decreasing cholesterol in her diet.  Assessment/Plan:   1. Other hyperlipidemia We will refill Lipitor 20 mg once daily for 90 days, and we will recheck labs in 1 month.  - atorvastatin (LIPITOR) 20 MG tablet; Take 1 tablet (20 mg total) by mouth daily.  Dispense: 90 tablet; Refill: 0  2. Obesity, Current BMI 39.1 Charlene [name] is currently in the action stage of change. As such, her goal is to continue with weight loss efforts. She has agreed to the Category 4 Plan or keeping a food journal and adhering to recommended goals of 2000-2200 calories and 150+ grams of protein daily.   Behavioral modification strategies: increasing lean protein intake, increasing water intake, and decreasing liquid calories.  Charlene [name] has agreed to follow-up with our clinic in 2 to 3 weeks. She was informed of the importance of frequent follow-up visits to maximize her success with intensive lifestyle modifications for her  multiple health conditions.   Objective:   Blood pressure 121/78, pulse 65, temperature 98.4 F (36.9 C), height 5\' 6"  (1.676 m), weight 242 lb (109.8 kg), last menstrual period 07/17/2021, SpO2 96 %. Body mass index is 39.06 kg/m.  General: Cooperative, alert, well developed, in no acute distress. HEENT: Conjunctivae and lids unremarkable. Cardiovascular: Regular rhythm.  Lungs: Normal work of breathing. Neurologic: No focal deficits.   Lab Results  Component Value Date   CREATININE 0.67 03/29/2021   BUN 8 03/29/2021   NA 140 03/29/2021   K 4.5 03/29/2021   CL 99 03/29/2021   CO2 23 03/29/2021   Lab Results  Component Value Date   ALT 70 (H) 03/29/2021   AST 62 (H) 03/29/2021   ALKPHOS 77 03/29/2021   BILITOT 0.2 03/29/2021   Lab Results  Component Value Date   HGBA1C 5.4 03/29/2021   HGBA1C 4.9 05/05/2018   Lab Results  Component Value Date   INSULIN 15.6 03/29/2021   Lab Results  Component Value Date   TSH 1.480 03/29/2021   Lab Results  Component Value Date   CHOL 317 (H) 03/29/2021   HDL 40 03/29/2021   LDLCALC 198 (H) 03/29/2021   LDLDIRECT 236.0 12/22/2019   TRIG 385 (H) 03/29/2021   CHOLHDL 3 06/17/2020   Lab Results  Component Value Date   VD25OH 19.20 (L) 06/17/2020   VD25OH 35 09/05/2009   VD25OH 9.0 02/20/2008   Lab Results  Component Value Date   WBC 8.6 06/17/2020  HGB 12.0 06/17/2020   HCT 34.5 (L) 06/17/2020   MCV 96.2 06/17/2020   PLT 315.0 06/17/2020   No results found for: "IRON", "TIBC", "FERRITIN"  Attestation Statements:   Reviewed by clinician on day of visit: allergies, medications, problem list, medical history, surgical history, family history, social history, and previous encounter notes.  Time spent on visit including pre-visit chart review and post-visit care and charting was 32 minutes.   I, Burt Knack, am acting as transcriptionist for Quillian Quince, MD.  I have reviewed the above documentation for  accuracy and completeness, and I agree with the above. -  Quillian Quince, MD

## 2021-08-23 ENCOUNTER — Encounter (INDEPENDENT_AMBULATORY_CARE_PROVIDER_SITE_OTHER): Payer: Self-pay

## 2021-08-27 ENCOUNTER — Other Ambulatory Visit (INDEPENDENT_AMBULATORY_CARE_PROVIDER_SITE_OTHER): Payer: Self-pay | Admitting: Family Medicine

## 2021-08-27 DIAGNOSIS — E559 Vitamin D deficiency, unspecified: Secondary | ICD-10-CM

## 2021-08-31 ENCOUNTER — Ambulatory Visit (INDEPENDENT_AMBULATORY_CARE_PROVIDER_SITE_OTHER): Payer: 59 | Admitting: Family Medicine

## 2021-09-12 ENCOUNTER — Ambulatory Visit: Payer: 59 | Admitting: Neurology

## 2021-09-21 ENCOUNTER — Ambulatory Visit (INDEPENDENT_AMBULATORY_CARE_PROVIDER_SITE_OTHER): Payer: 59 | Admitting: Family Medicine

## 2021-09-25 NOTE — Progress Notes (Unsigned)
[facility]  PATIENT: Charlene [name] DOB: 1980-10-23  REFERRING DOCTOR OR PCP: Neena Rhymes, MD (PCP); Harlen Labs MD ([facility]) SOURCE: Patient, notes from Dr. Renne Crigler, imaging and lab reports, MRI images personally reviewed.  _________________________________   HISTORICAL  CHIEF COMPLAINT:  Chief Complaint  Patient presents with   New Patient (Initial Visit)    Pt alone, rm 2. 2006 dg with MS. Currently on Ocrevus. Last infusion 07/19/21 and was getting them [facility] [place]. Wanting to transfer to our infusion. She states overall stable    HISTORY OF PRESENT ILLNESS:  I had the pleasure seeing your patient, Charlene [name], at the [facility] at [facility] for neurologic consultation regarding her multiple sclerosis.  She is a 41 year old woman who was diagnosed by Dr. Priscille Loveless in 2006 in [place] after presenting with right optic neuritis.  A few years earlier, she had an MRI showing white matter lesions concerning for MS.  A spinal tap before the ON was non-diagnostic.  She was on Copaxone and then Betaseron.   She then switched to Tysabri around 2007-2011 and then stopped for childbearing. 2012-2016.   She had some fluctuations of symptoms those years but no definite exaceration.   She was placed on Tecfidera around 2016 but stopped due to low WBC.  She has been on Ocrevus since 2017.  On it she has had no exacerbations (last infusion was July 2023).     Currently, she feels she is doing eell physically but notes fatigue and cognitive issues.   She has trouble with word recall, following written or verbal instructions .   She has depression, mood swings and anxiety.  Physically, she feels gait does well though balance mildly off and she uses the bannister on stairs and cannot use a ladder.  She notes tingling in her wrists, ankles and feet bilaterally.   She notes mild arm weakness.   She has heat intolerance and poor exercise  tolerance.    She sometimes feels dizzy - worse if she bends or kneels when she often gets nauseous as well.   She generally does well wit hbladder function but if she has nausea or vomiting she may have incontinence.   She has been 20/20 with vision but the past year has noted some blurriness.  However, eye exam was fine.   She has some irritating sensations in her eyes.  She was started on dry eyedrops but without benefit.    She has biploar disease and also diagnosed with PTSD, borderline personality disease and depression.  She sees [facility] a [facility].    She sometimes has inappropriate crying.   Does not have inappropriate laughing.    This was daily while she was working and now occurs a couple times a week.     Her mother had dementia and also cerebellar atrophy.       Imaging: MRI of the brain 05/22/2021 shows multiple T2/FLAIR hyperintense foci in the periventricular, juxtacortical and deep white matter of the hemispheres.  None of the foci enhance or appear to be acute.  MRI of the brain 04/16/2020 shows multiple T2/FLAIR hyperintense foci in the periventricular, juxtacortical and deep white matter of the hemispheres.  None of the foci enhance or appear to be acute.  No infratentorial foci noted.     REVIEW OF SYSTEMS: Constitutional: No fevers, chills, sweats, or change in appetite.  Other fatigue Eyes: No visual changes, double vision.  No eye pain but  has eye irritation Ear, nose and throat: No hearing loss, ear pain, nasal congestion, sore throat Cardiovascular: No chest pain, palpitations Respiratory:  No shortness of breath at rest or with exertion.   No wheezes GastrointestinaI: No nausea, vomiting, diarrhea, abdominal pain, fecal incontinence Genitourinary:  No dysuria, urinary retention or frequency.  No nocturia. Musculoskeletal:  No neck pain, back pain Integumentary: No rash, pruritus, skin lesions Neurological: as above Psychiatric: She has  bipolar disease and depression.   Endocrine: No palpitations, diaphoresis, change in appetite, change in weigh or increased thirst Hematologic/Lymphatic:  No anemia, purpura, petechiae. Allergic/Immunologic: No itchy/runny eyes, nasal congestion, recent allergic reactions, rashes  ALLERGIES: Allergies  Allergen Reactions   Ciprofloxacin Other (See Comments)    Other reaction(s): Mental Status Changes  Manic episode   Manic episode Manic episode Other reaction(s): Mental Status Changes  Manic episode   Prednisone Other (See Comments)    Other reaction(s):  Puts her in manic episode. She is Bipolar   Puts her in manic episode. She is Bipolar Puts her in manic episode. She is Bipolar Other reaction(s):  Puts her in manic episode. She is Bipolar   African Mango [Irvingia Gabonensis] Other (See Comments)    HOME MEDICATIONS:  Current Outpatient Medications:    ALPRAZolam (XANAX) 0.5 MG tablet, Take 0.5 mg by mouth daily as needed., Disp: , Rfl:    atorvastatin (LIPITOR) 20 MG tablet, Take 20 mg by mouth daily., Disp: , Rfl:    calcium carbonate (TUMS EX) 750 MG chewable tablet, Chew 1 tablet by mouth daily., Disp: , Rfl:    clonazePAM (KLONOPIN) 1 MG tablet, Take 1 mg by mouth at bedtime as needed., Disp: , Rfl:    ibuprofen (ADVIL) 200 MG tablet, Take 200 mg by mouth every 6 (six) hours as needed., Disp: , Rfl:    LAMOTRIGINE PO, Take 100 mg by mouth daily., Disp: , Rfl:    lisdexamfetamine (VYVANSE) 50 MG capsule, Take 50 mg by mouth daily., Disp: , Rfl:    Multiple Vitamin (MULTIVITAMIN) tablet, Take 1 tablet by mouth daily., Disp: , Rfl:    naltrexone (DEPADE) 50 MG tablet, Take 50 mg by mouth daily., Disp: , Rfl:    ocrelizumab (OCREVUS) 300 MG/10ML injection, See admin instructions., Disp: , Rfl:    SUMAtriptan (IMITREX) 5 MG/ACT nasal spray, Place 1 spray into the nose as needed for migraine., Disp: , Rfl:    Vitamin D, Ergocalciferol, (DRISDOL) 1.25 MG (50000 UNIT) CAPS  capsule, Take 1 capsule (50,000 Units total) by mouth every 7 (seven) days., Disp: 12 capsule, Rfl: 0   zolmitriptan (ZOMIG) 5 MG tablet, 4mg  as needed, Disp: , Rfl:   PAST MEDICAL HISTORY: Past Medical History:  Diagnosis Date   Alcoholism (HCC)    Anxiety    Anxiety and depression    Bipolar 2 disorder (HCC)    Borderline personality disorder (HCC)    Chest pain    Chronic fatigue syndrome    Claustrophobia    on meds   Constipation    Depression    on meds   Elevated blood sugar 06/08/2014   Elevated LFTs    Fatigue    Fatty liver 10/2014   GERD (gastroesophageal reflux disease)    diet related   Heart murmur    Hyperlipidemia    on meds   Infertility associated with anovulation    Joint pain    Low grade squamous intraepithelial lesion (LGSIL) on Pap smear 10/30/2011   Major depressive  disorder    Migraines    MS (multiple sclerosis) (HCC)    Neuromuscular disorder (HCC)    Obesity    Obesity in pregnancy, antepartum    PCOS (polycystic ovarian syndrome)    Personal history of pre-term labor    Prediabetes    Pregnancy induced hypertension    pregnancy related- hx of   Psoriasis    Sleep apnea    SOB (shortness of breath)    Vitamin D deficiency     PAST SURGICAL HISTORY: Past Surgical History:  Procedure Laterality Date   COLONOSCOPY  10/17/2020   DILATION AND CURETTAGE OF UTERUS  08/15/2008   DILATION AND EVACUATION  12/29/2011   Procedure: DILATATION AND EVACUATION;  Surgeon: Catalina Antigua, MD;  Location: [facility];  Service: Gynecology;;  Dr, Emelda Fear transferred case to Dr. Jolayne Panther   LAPAROSCOPIC GASTRIC SLEEVE RESECTION N/A 12/07/2014   Procedure: LAPAROSCOPIC GASTRIC SLEEVE RESECTION;  Surgeon: Ovidio Kin, MD;  Location: [facility];  Service: General;  Laterality: N/A;    FAMILY HISTORY: Family History  Problem Relation Age of Onset   Hypertension Mother    Hyperlipidemia Mother    Mental illness Mother    Dementia Mother        early onset    Depression Mother    Anxiety disorder Mother    Drug abuse Mother    Eating disorder Mother    Alcoholism Father    Heart disease Father    Alcoholism Maternal Grandmother    Mental illness Maternal Grandmother    Esophageal cancer Maternal Grandmother    Heart attack Maternal Grandfather    Colon cancer Neg Hx    Stomach cancer Neg Hx    Colon polyps Neg Hx    Rectal cancer Neg Hx     SOCIAL HISTORY: Social History   Socioeconomic History   Marital status: Married    Spouse name: Not on file   Number of children: Not on file   Years of education: Not on file   Highest education level: Not on file  Occupational History   Occupation: Print production planner   Occupation: stay at home mom, part time daycare  Tobacco Use   Smoking status: Former    Packs/day: 1.00    Years: 15.00    Total pack years: 15.00    Types: Cigarettes    Quit date: 07/02/2010    Years since quitting: 11.2   Smokeless tobacco: Never  Vaping Use   Vaping Use: Never used  Substance and Sexual Activity   Alcohol use: Yes    Comment: alcoholic - so she either does not drink at all or she over drinks   Drug use: No   Sexual activity: Yes    Partners: Male    Birth control/protection: None    Comment: vasectomy  Other Topics Concern   Not on file  Social History Narrative   Work or School: homemaker      Home Situation: lives with husband, 2 twins (2.26 yo) and 23 month old in 2016      Spiritual Beliefs: none      Lifestyle: no regular exercise, poor diet      Social Determinants of Health   Financial Resource Strain: Not on file  Food Insecurity: Not on file  Transportation Needs: Not on file  Physical Activity: Not on file  Stress: Not on file  Social Connections: Not on file  Intimate Partner Violence: Not on file       PHYSICAL  EXAM  Vitals:   09/26/21 0905  BP: (!) 152/94  Pulse: (!) 101  Weight: 245 lb (111.1 kg)  Height: 5' 6.5" (1.689 m)    Body mass index is 38.95  kg/m.   General: The patient is well-developed and well-nourished and in no acute distress  HEENT:  Head is [place]/AT.  Sclera are anicteric.   Neck: No carotid bruits are noted.  The neck is nontender.  Cardiovascular: The heart has a regular rate and rhythm with a normal S1 and S2. There were no murmurs, gallops or rubs.    Skin: Extremities are without rash or  edema.  Musculoskeletal:  Back is nontender  Neurologic Exam  Mental status: The patient is alert and oriented x 3 at the time of the examination. The patient has apparent normal recent and remote memory, with an apparently normal attention span and concentration ability.   Speech is normal.  Cranial nerves: Extraocular movements are full. Pupils are equal, round, and reactive to light and accomodation.  Color vision was symmetric.  Facial symmetry is present. There is good facial sensation to soft touch bilaterally.Facial strength is normal.  Trapezius and sternocleidomastoid strength is normal. No dysarthria is noted.  The tongue is midline, and the patient has symmetric elevation of the soft palate. No obvious hearing deficits are noted.  Motor:  Muscle bulk is normal.   Tone is normal. Strength is  5 / 5 in all 4 extremities.   Sensory: Sensory testing is intact to pinprick, soft touch and vibration sensation in all 4 extremities.  Coordination: Cerebellar testing reveals good finger-nose-finger and heel-to-shin bilaterally.  Gait and station: Station is normal.   Gait is normal. Tandem gait is mildly wide. Romberg is negative.   Reflexes: Deep tendon reflexes are symmetric and normal bilaterally.   Plantar responses are flexor.    DIAGNOSTIC DATA (LABS, IMAGING, TESTING) - I reviewed patient records, labs, notes, testing and imaging myself where available.  Lab Results  Component Value Date   WBC 8.6 06/17/2020   HGB 12.0 06/17/2020   HCT 34.5 (L) 06/17/2020   MCV 96.2 06/17/2020   PLT 315.0 06/17/2020       Component Value Date/Time   NA 140 03/29/2021 1025   K 4.5 03/29/2021 1025   CL 99 03/29/2021 1025   CO2 23 03/29/2021 1025   GLUCOSE 94 03/29/2021 1025   GLUCOSE 93 06/17/2020 1139   GLUCOSE 93 11/24/2013 1126   BUN 8 03/29/2021 1025   CREATININE 0.67 03/29/2021 1025   CREATININE 0.43 (L) 12/31/2013 1542   CALCIUM 9.6 03/29/2021 1025   PROT 7.3 03/29/2021 1025   ALBUMIN 4.5 03/29/2021 1025   AST 62 (H) 03/29/2021 1025   ALT 70 (H) 03/29/2021 1025   ALKPHOS 77 03/29/2021 1025   BILITOT 0.2 03/29/2021 1025   GFRNONAA >60 12/02/2014 1120   GFRAA >60 12/02/2014 1120   Lab Results  Component Value Date   CHOL 317 (H) 03/29/2021   HDL 40 03/29/2021   LDLCALC 198 (H) 03/29/2021   LDLDIRECT 236.0 12/22/2019   TRIG 385 (H) 03/29/2021   CHOLHDL 3 06/17/2020   Lab Results  Component Value Date   HGBA1C 5.4 03/29/2021   No results found for: "VITAMINB12" Lab Results  Component Value Date   TSH 1.480 03/29/2021       ASSESSMENT AND PLAN  Multiple sclerosis (HCC) - Plan: IgG, IgA, IgM, Hepatitis B surface antigen, HIV Antibody (routine testing w rflx), QuantiFERON-TB Gold Plus, Varicella zoster  antibody, IgG, Hepatitis B core antibody, total, Hepatitis B surface antibody,qualitative, Hepatitis C antibody  History of abnormal cervical Pap smear  High risk medication use - Plan: IgG, IgA, IgM, Hepatitis B surface antigen, HIV Antibody (routine testing w rflx), QuantiFERON-TB Gold Plus, Varicella zoster antibody, IgG, Hepatitis B core antibody, total, Hepatitis B surface antibody,qualitative, Hepatitis C antibody  Vitamin D deficiency - Plan: Vitamin D, Ergocalciferol, (DRISDOL) 1.25 MG (50000 UNIT) CAPS capsule   In summary, Charlene [name] is a 41 year old woman with a long history of multiple sclerosis who has been stable on Ocrevus therapy for the past few years.  She has no recent exacerbations.  I will check blood work for chronic infections.  CD19/CD20 count was negative  when recently checked a [facility].  Additionally we will check a varicella IgG (it was negative in the past).  If it is still negative I would recommend that she get the Shingrix vaccination.  Continue to be active and exercise as tolerated.  I will renew the vitamin D.  She will return to see me in 6 months or sooner if there are new or worsening neurologic symptoms.   Roben Schliep A. Epimenio Foot, MD, [facility] 09/26/2021, 9:19 AM Certified in Neurology, Clinical Neurophysiology, Sleep Medicine and Neuroimaging  [facility] [address] [place], [place] [address] [phone]

## 2021-09-26 ENCOUNTER — Ambulatory Visit (INDEPENDENT_AMBULATORY_CARE_PROVIDER_SITE_OTHER): Payer: 59 | Admitting: Neurology

## 2021-09-26 ENCOUNTER — Encounter: Payer: Self-pay | Admitting: Neurology

## 2021-09-26 VITALS — BP 152/94 | HR 101 | Ht 66.5 in | Wt 245.0 lb

## 2021-09-26 DIAGNOSIS — Z79899 Other long term (current) drug therapy: Secondary | ICD-10-CM

## 2021-09-26 DIAGNOSIS — G35 Multiple sclerosis: Secondary | ICD-10-CM | POA: Diagnosis not present

## 2021-09-26 DIAGNOSIS — Z8742 Personal history of other diseases of the female genital tract: Secondary | ICD-10-CM

## 2021-09-26 DIAGNOSIS — E559 Vitamin D deficiency, unspecified: Secondary | ICD-10-CM

## 2021-09-26 MED ORDER — VITAMIN D (ERGOCALCIFEROL) 1.25 MG (50000 UNIT) PO CAPS: 50000.0000 [IU] | ORAL_CAPSULE | ORAL | 3 refills | Status: DC

## 2021-10-02 LAB — IGG, IGA, IGM
IgA/Immunoglobulin A, Serum: 190 mg/dL (ref 87–352)
IgG (Immunoglobin G), Serum: 874 mg/dL (ref 586–1602)
IgM (Immunoglobulin M), Srm: 53 mg/dL (ref 26–217)

## 2021-10-02 LAB — QUANTIFERON-TB GOLD PLUS
QuantiFERON Mitogen Value: >10 IU/mL
QuantiFERON Nil Value: 0.03 IU/mL
QuantiFERON TB1 Ag Value: 0.03 IU/mL
QuantiFERON TB2 Ag Value: 0.04 IU/mL
QuantiFERON-TB Gold Plus: NEGATIVE

## 2021-10-02 LAB — HEPATITIS C ANTIBODY: Hep C Virus Ab: NONREACTIVE

## 2021-10-02 LAB — HIV ANTIBODY (ROUTINE TESTING W REFLEX): HIV Screen 4th Generation wRfx: NONREACTIVE

## 2021-10-02 LAB — HEPATITIS B SURFACE ANTIBODY,QUALITATIVE: Hep B Surface Ab, Qual: NONREACTIVE

## 2021-10-02 LAB — HEPATITIS B CORE ANTIBODY, TOTAL: Hep B Core Total Ab: NEGATIVE

## 2021-10-02 LAB — VARICELLA ZOSTER ANTIBODY, IGG: Varicella zoster IgG: <135 index — ABNORMAL LOW (ref >165)

## 2021-10-02 LAB — HEPATITIS B SURFACE ANTIGEN: Hepatitis B Surface Ag: NEGATIVE

## 2021-10-03 ENCOUNTER — Telehealth: Payer: Self-pay | Admitting: *Deleted

## 2021-10-03 NOTE — Telephone Encounter (Signed)
-----   Message from Britt Bottom, MD sent at 10/02/2021  5:13 PM EDT ----- She is on Ocrevus and the labs are fine to continue.  However, the varicella IgG did not show that she had antibodies against the virus.  I would like her to get the Shingrix vaccine (1 shot followed 2 months later by the other shot) she should be able to get this either through primary care or a drugstore.  She cannot take the live vaccine as she is on an immune acting agent

## 2021-10-06 ENCOUNTER — Other Ambulatory Visit (HOSPITAL_BASED_OUTPATIENT_CLINIC_OR_DEPARTMENT_OTHER): Payer: Self-pay | Admitting: Obstetrics & Gynecology

## 2021-10-06 DIAGNOSIS — Z1231 Encounter for screening mammogram for malignant neoplasm of breast: Secondary | ICD-10-CM

## 2021-10-10 NOTE — Telephone Encounter (Signed)
Ocrevus order given to Dr. Felecia Shelling for review and signature.

## 2021-10-10 NOTE — Telephone Encounter (Signed)
Ocrevus order given to the infusion suite for processing. She is not due for her Ocrevus until January but should obtain complete Shingrix vaccination series prior to next infusion.

## 2021-10-12 ENCOUNTER — Ambulatory Visit (HOSPITAL_BASED_OUTPATIENT_CLINIC_OR_DEPARTMENT_OTHER)
Admission: RE | Admit: 2021-10-12 | Discharge: 2021-10-12 | Disposition: A | Discharge status: Home / Self Care | Payer: 59 | Source: Ambulatory Visit | Attending: Obstetrics & Gynecology | Admitting: Obstetrics & Gynecology

## 2021-10-12 ENCOUNTER — Ambulatory Visit (INDEPENDENT_AMBULATORY_CARE_PROVIDER_SITE_OTHER): Payer: 59 | Admitting: Family Medicine

## 2021-10-12 DIAGNOSIS — Z23 Encounter for immunization: Secondary | ICD-10-CM

## 2021-10-12 DIAGNOSIS — Z1231 Encounter for screening mammogram for malignant neoplasm of breast: Secondary | ICD-10-CM | POA: Insufficient documentation

## 2021-10-12 NOTE — Progress Notes (Signed)
Patient presented to the office for Nurse visit to receive first shingles vaccine pt is aware of potential out of pocket cost if insurance does not cover vaccine

## 2021-10-17 ENCOUNTER — Encounter (INDEPENDENT_AMBULATORY_CARE_PROVIDER_SITE_OTHER): Payer: Self-pay | Admitting: Family Medicine

## 2021-10-17 ENCOUNTER — Ambulatory Visit (INDEPENDENT_AMBULATORY_CARE_PROVIDER_SITE_OTHER): Payer: 59 | Admitting: Family Medicine

## 2021-10-17 VITALS — BP 135/88 | HR 94 | Temp 98.5°F | Ht 66.0 in | Wt 241.0 lb

## 2021-10-17 DIAGNOSIS — E669 Obesity, unspecified: Secondary | ICD-10-CM | POA: Diagnosis not present

## 2021-10-17 DIAGNOSIS — E559 Vitamin D deficiency, unspecified: Secondary | ICD-10-CM | POA: Diagnosis not present

## 2021-10-17 DIAGNOSIS — R030 Elevated blood-pressure reading, without diagnosis of hypertension: Secondary | ICD-10-CM | POA: Diagnosis not present

## 2021-10-17 DIAGNOSIS — R0602 Shortness of breath: Secondary | ICD-10-CM

## 2021-10-17 DIAGNOSIS — Z6839 Body mass index (BMI) 39.0-39.9, adult: Secondary | ICD-10-CM

## 2021-10-18 NOTE — Progress Notes (Signed)
Chief Complaint:   OBESITY Charlene [name] is here to discuss her progress with her obesity treatment plan along with follow-up of her obesity related diagnoses. Charlene [name] is on the Category 4 Plan or keeping a food journal and adhering to recommended goals of 2000-2200 calories and 150+ grams of protein daily and states she is following her eating plan approximately 0% of the time. Charlene [name] states she is doing 0 minutes 0 times per week.  Today's visit was #: 7 Starting weight: 250 lbs Starting date: 03/29/2021 Today's weight: 241 lbs Today's date: 10/17/2021 Total lbs lost to date: 9 Total lbs lost since last in-office visit: 1  Interim History: [name]'s last visit was approximately 8 weeks ago due to insurance mistakes.  This has been straightened out and she is ready to get back on track.  Subjective:   1. Pre-hypertension Charlene [name] has had a lot of stress recently and her blood pressure is mildly elevated today.  2. Vitamin D deficiency Charlene [name] is on vitamin D prescription, but her last level was not yet at goal.  3. SOBOE (shortness of breath on exertion) Charlene [name] notes no change in shortness of breath, and she is due for her IC to be repeated.  Assessment/Plan:   1. Pre-hypertension Charlene [name] will continue with her diet and exercise, and we will recheck labs at her next visit.  2. Vitamin D deficiency Charlene [name] will continue prescription Vitamin D 50,000 IU every week, and we will recheck labs in 1 month. She will follow-up for routine testing of Vitamin D, at least 2-3 times per year to avoid over-replacement.  3. SOBOE (shortness of breath on exertion) IC today shows RMR is more appropriate.  Charlene [name] will continue with her diet as modified and she will increase her exercise.  4. Obesity, Current BMI 39.0 Charlene [name] is currently in the action stage of change. As such, her goal is to continue with weight loss efforts. She has agreed to change to keeping a food journal and adhering to recommended goals of  1800-2000 calories and 120+ grams of protein daily.   Behavioral modification strategies: increasing lean protein intake.  Charlene [name] has agreed to follow-up with our clinic in 2 to 3 weeks, and then in 2 weeks. She was informed of the importance of frequent follow-up visits to maximize her success with intensive lifestyle modifications for her multiple health conditions.   Objective:   Blood pressure 135/88, pulse 94, temperature 98.5 F (36.9 C), height 5\' 6"  (1.676 m), weight 241 lb (109.3 kg), last menstrual period 10/11/2021, SpO2 97 %. Body mass index is 38.9 kg/m.  General: Cooperative, alert, well developed, in no acute distress. HEENT: Conjunctivae and lids unremarkable. Cardiovascular: Regular rhythm.  Lungs: Normal work of breathing. Neurologic: No focal deficits.   Lab Results  Component Value Date   CREATININE 0.67 03/29/2021   BUN 8 03/29/2021   NA 140 03/29/2021   K 4.5 03/29/2021   CL 99 03/29/2021   CO2 23 03/29/2021   Lab Results  Component Value Date   ALT 70 (H) 03/29/2021   AST 62 (H) 03/29/2021   ALKPHOS 77 03/29/2021   BILITOT 0.2 03/29/2021   Lab Results  Component Value Date   HGBA1C 5.4 03/29/2021   HGBA1C 4.9 05/05/2018   Lab Results  Component Value Date   INSULIN 15.6 03/29/2021   Lab Results  Component Value Date   TSH 1.480 03/29/2021   Lab Results  Component Value Date   CHOL 317 (H) 03/29/2021  HDL 40 03/29/2021   LDLCALC 198 (H) 03/29/2021   LDLDIRECT 236.0 12/22/2019   TRIG 385 (H) 03/29/2021   CHOLHDL 3 06/17/2020   Lab Results  Component Value Date   VD25OH 19.20 (L) 06/17/2020   VD25OH 35 09/05/2009   VD25OH 9.0 02/20/2008   Lab Results  Component Value Date   WBC 8.6 06/17/2020   HGB 12.0 06/17/2020   HCT 34.5 (L) 06/17/2020   MCV 96.2 06/17/2020   PLT 315.0 06/17/2020   No results found for: "IRON", "TIBC", "FERRITIN"  Attestation Statements:   Reviewed by clinician on day of visit: allergies, medications,  problem list, medical history, surgical history, family history, social history, and previous encounter notes.  Time spent on visit including pre-visit chart review and post-visit care and charting was 30 minutes.   I, Trixie Dredge, am acting as transcriptionist for Dennard Nip, MD.  I have reviewed the above documentation for accuracy and completeness, and I agree with the above. -  Dennard Nip, MD

## 2021-10-31 ENCOUNTER — Encounter: Payer: Self-pay | Admitting: Neurology

## 2021-11-08 ENCOUNTER — Encounter (INDEPENDENT_AMBULATORY_CARE_PROVIDER_SITE_OTHER): Payer: Self-pay | Admitting: Family Medicine

## 2021-11-08 ENCOUNTER — Ambulatory Visit (INDEPENDENT_AMBULATORY_CARE_PROVIDER_SITE_OTHER): Payer: 59 | Admitting: Family Medicine

## 2021-11-08 VITALS — BP 120/82 | HR 95 | Temp 98.7°F | Ht 66.0 in | Wt 236.0 lb

## 2021-11-08 DIAGNOSIS — R7989 Other specified abnormal findings of blood chemistry: Secondary | ICD-10-CM

## 2021-11-08 DIAGNOSIS — E669 Obesity, unspecified: Secondary | ICD-10-CM | POA: Diagnosis not present

## 2021-11-08 DIAGNOSIS — E7849 Other hyperlipidemia: Secondary | ICD-10-CM

## 2021-11-08 DIAGNOSIS — E559 Vitamin D deficiency, unspecified: Secondary | ICD-10-CM

## 2021-11-08 DIAGNOSIS — Z6838 Body mass index (BMI) 38.0-38.9, adult: Secondary | ICD-10-CM

## 2021-11-08 DIAGNOSIS — R739 Hyperglycemia, unspecified: Secondary | ICD-10-CM | POA: Diagnosis not present

## 2021-11-08 MED ORDER — ATORVASTATIN CALCIUM 20 MG PO TABS: 20.0000 mg | ORAL_TABLET | Freq: Every day | ORAL | 0 refills | Status: DC

## 2021-11-17 LAB — CMP14+EGFR
ALT: 45 IU/L — ABNORMAL HIGH (ref 0–32)
AST: 41 IU/L — ABNORMAL HIGH (ref 0–40)
Albumin/Globulin Ratio: 1.9 (ref 1.2–2.2)
Albumin: 4.6 g/dL (ref 3.9–4.9)
Alkaline Phosphatase: 59 IU/L (ref 44–121)
BUN/Creatinine Ratio: 10 (ref 9–23)
BUN: 8 mg/dL (ref 6–24)
Bilirubin Total: <0.2 mg/dL (ref 0.0–1.2)
CO2: 25 mmol/L (ref 20–29)
Calcium: 9.1 mg/dL (ref 8.7–10.2)
Chloride: 101 mmol/L (ref 96–106)
Creatinine, Ser: 0.81 mg/dL (ref 0.57–1.00)
Globulin, Total: 2.4 g/dL (ref 1.5–4.5)
Glucose: 88 mg/dL (ref 70–99)
Potassium: 4.3 mmol/L (ref 3.5–5.2)
Sodium: 138 mmol/L (ref 134–144)
Total Protein: 7 g/dL (ref 6.0–8.5)
eGFR: 93 mL/min/{1.73_m2} (ref >59)

## 2021-11-17 LAB — HEMOGLOBIN A1C
Est. average glucose Bld gHb Est-mCnc: 100 mg/dL
Hgb A1c MFr Bld: 5.1 % (ref 4.8–5.6)

## 2021-11-17 LAB — VITAMIN D 25 HYDROXY (VIT D DEFICIENCY, FRACTURES): Vit D, 25-Hydroxy: 68 ng/mL (ref 30.0–100.0)

## 2021-11-17 LAB — LIPID PANEL WITH LDL/HDL RATIO
Cholesterol, Total: 144 mg/dL (ref 100–199)
HDL: 48 mg/dL (ref >39)
LDL Chol Calc (NIH): 77 mg/dL (ref 0–99)
LDL/HDL Ratio: 1.6 ratio (ref 0.0–3.2)
Triglycerides: 106 mg/dL (ref 0–149)
VLDL Cholesterol Cal: 19 mg/dL (ref 5–40)

## 2021-11-17 LAB — VITAMIN B6: Vitamin B6: 13.7 ug/L (ref 3.4–65.2)

## 2021-11-17 LAB — FOLATE: Folate: >20 ng/mL (ref >3.0)

## 2021-11-17 LAB — INSULIN, RANDOM: INSULIN: 15.6 u[IU]/mL (ref 2.6–24.9)

## 2021-11-17 LAB — VITAMIN B1: Thiamine: 135.6 nmol/L (ref 66.5–200.0)

## 2021-11-17 LAB — VITAMIN B12: Vitamin B-12: 684 pg/mL (ref 232–1245)

## 2021-11-29 ENCOUNTER — Encounter (INDEPENDENT_AMBULATORY_CARE_PROVIDER_SITE_OTHER): Payer: Self-pay | Admitting: Family Medicine

## 2021-11-29 ENCOUNTER — Ambulatory Visit (INDEPENDENT_AMBULATORY_CARE_PROVIDER_SITE_OTHER): Payer: 59 | Admitting: Family Medicine

## 2021-11-29 VITALS — BP 131/81 | HR 104 | Temp 98.2°F | Ht 66.0 in | Wt 231.0 lb

## 2021-11-29 DIAGNOSIS — E88819 Insulin resistance, unspecified: Secondary | ICD-10-CM | POA: Diagnosis not present

## 2021-11-29 DIAGNOSIS — E559 Vitamin D deficiency, unspecified: Secondary | ICD-10-CM

## 2021-11-29 DIAGNOSIS — E7849 Other hyperlipidemia: Secondary | ICD-10-CM | POA: Diagnosis not present

## 2021-11-29 DIAGNOSIS — Z6841 Body Mass Index (BMI) 40.0 and over, adult: Secondary | ICD-10-CM

## 2021-11-29 DIAGNOSIS — R7989 Other specified abnormal findings of blood chemistry: Secondary | ICD-10-CM | POA: Diagnosis not present

## 2021-11-29 DIAGNOSIS — Z6837 Body mass index (BMI) 37.0-37.9, adult: Secondary | ICD-10-CM

## 2021-11-29 DIAGNOSIS — E669 Obesity, unspecified: Secondary | ICD-10-CM

## 2021-11-29 MED ORDER — ATORVASTATIN CALCIUM 20 MG PO TABS: 20.0000 mg | ORAL_TABLET | Freq: Every day | ORAL | 0 refills | Status: DC

## 2021-12-02 NOTE — Progress Notes (Signed)
Chief Complaint:   OBESITY Charlene [name] is here to discuss her progress with her obesity treatment plan along with follow-up of her obesity related diagnoses. Charlene [name] is on keeping a food journal and adhering to recommended goals of 1800-2000 calories and 120+ grams of protein daily and states she is following her eating plan approximately 87% of the time. Charlene [name] states she is doing 0 minutes 0 times per week.  Today's visit was #: 8 Starting weight: 250 lbs Starting date: 03/29/2021 Today's weight: 236 lbs Today's date: 11/08/2021 Total lbs lost to date: 14 Total lbs lost since last in-office visit: 5  Interim History: Charlene [name] continues to do well with weight loss. She is working on increasing her protein and she is getting better, but her muscle mass is staring to decrease.   Subjective:   1. Other hyperlipidemia Charlene [name] is on Lipitor and she is working on decreasing cholesterol in her diet.   2. Elevated blood glucose Charlene [name] has a history of elevated glucose. She is due for labs.   3. Vitamin D deficiency Charlene [name] is on Vitamin D and she is at risk of over-replacement.   4. Elevated LFTs Charlene [name] has quit ETOH completely and she is working on her weight loss.   Assessment/Plan:   1. Other hyperlipidemia We will check labs today, and we will refill Lipitor for 1 month. Charlene [name] will continue to work on diet, exercise and weight loss efforts. Orders and follow up as documented in patient record.   - CMP14+EGFR - Lipid Panel With LDL/HDL Ratio  2. Elevated blood glucose We will check labs today, and we will follow-up at [facility] next visit.   - Hemoglobin A1c  3. Vitamin D deficiency We will check labs today. Charlene [name] will follow-up for routine testing of Vitamin D, at least 2-3 times per year to avoid over-replacement.  - Vitamin B12 - Insulin, random - VITAMIN D 25 Hydroxy (Vit-D Deficiency, Fractures) - Vitamin B6 - Vitamin B1 - Folate  4. Elevated LFTs We will check labs today. We  discussed the likely diagnosis of non-alcoholic fatty liver disease today and how this condition is obesity related. Charlene [name] was educated the importance of weight loss. Charlene [name] agreed to continue with her weight loss efforts with healthier diet and exercise as an essential part of her treatment plan.  - CMP14+EGFR  5. Obesity, Current BMI 38.1 Charlene [name] is currently in the action stage of change. As such, her goal is to continue with weight loss efforts. She has agreed to keeping a food journal and adhering to recommended goals of 1800-2000 calories and 120+ grams of protein daily.   Exercise goals: Increase strengthening exercise.   Behavioral modification strategies: increasing lean protein intake.  Charlene [name] has agreed to follow-up with our clinic in 3 to 4 weeks. She was informed of the importance of frequent follow-up visits to maximize her success with intensive lifestyle modifications for her multiple health conditions.   Charlene [name] was informed we would discuss her lab results at her next visit unless there is a critical issue that needs to be addressed sooner. Charlene [name] agreed to keep her next visit at the agreed upon time to discuss these results.  Objective:   Blood pressure 120/82, pulse 95, temperature 98.7 F (37.1 C), height '5\' 6"'  (1.676 m), weight 236 lb (107 kg), last menstrual period 11/04/2021, SpO2 98 %. Body mass index is 38.09 kg/m.  General: Cooperative, alert, well developed, in no acute distress. HEENT: Conjunctivae and lids unremarkable. Cardiovascular: Regular rhythm.  Lungs: Normal work of breathing. Neurologic: No focal deficits.   Lab Results  Component Value Date   CREATININE 0.81 11/08/2021   BUN 8 11/08/2021   NA 138 11/08/2021   K 4.3 11/08/2021   CL 101 11/08/2021   CO2 25 11/08/2021   Lab Results  Component Value Date   ALT 45 (H) 11/08/2021   AST 41 (H) 11/08/2021   ALKPHOS 59 11/08/2021   BILITOT <0.2 11/08/2021   Lab Results  Component Value Date    HGBA1C 5.1 11/08/2021   HGBA1C 5.4 03/29/2021   HGBA1C 4.9 05/05/2018   Lab Results  Component Value Date   INSULIN 15.6 11/08/2021   INSULIN 15.6 03/29/2021   Lab Results  Component Value Date   TSH 1.480 03/29/2021   Lab Results  Component Value Date   CHOL 144 11/08/2021   HDL 48 11/08/2021   LDLCALC 77 11/08/2021   LDLDIRECT 236.0 12/22/2019   TRIG 106 11/08/2021   CHOLHDL 3 06/17/2020   Lab Results  Component Value Date   VD25OH 68.0 11/08/2021   VD25OH 19.20 (L) 06/17/2020   VD25OH 35 09/05/2009   Lab Results  Component Value Date   WBC 8.6 06/17/2020   HGB 12.0 06/17/2020   HCT 34.5 (L) 06/17/2020   MCV 96.2 06/17/2020   PLT 315.0 06/17/2020   No results found for: "IRON", "TIBC", "FERRITIN"  Attestation Statements:   Reviewed by clinician on day of visit: allergies, medications, problem list, medical history, surgical history, family history, social history, and previous encounter notes.   I, Trixie Dredge, am acting as transcriptionist for Dennard Nip, MD.  I have reviewed the above documentation for accuracy and completeness, and I agree with the above. -  Dennard Nip, MD

## 2021-12-04 ENCOUNTER — Telehealth (INDEPENDENT_AMBULATORY_CARE_PROVIDER_SITE_OTHER): Payer: Self-pay | Admitting: Family Medicine

## 2021-12-04 ENCOUNTER — Encounter (INDEPENDENT_AMBULATORY_CARE_PROVIDER_SITE_OTHER): Payer: Self-pay | Admitting: Family Medicine

## 2021-12-04 DIAGNOSIS — E7849 Other hyperlipidemia: Secondary | ICD-10-CM

## 2021-12-04 MED ORDER — ATORVASTATIN CALCIUM 20 MG PO TABS: 20.0000 mg | ORAL_TABLET | Freq: Every day | ORAL | 0 refills | Status: DC

## 2021-12-04 NOTE — Telephone Encounter (Signed)
Patient called stating that she needs a 90 Day supply of  atorvastatin (LIPITOR) 20 MG tablet. Patient states that her insurance will only approve the 90 day supply. Pt states that the pharmacy has sent in a request twice for the 90 day suppy, but each has been denied by our office. Pt only has one pill remaining. Patient is leaving town for Thanksgiving on tomorrow. Please call the patient at the number provided. Thank You!

## 2021-12-06 ENCOUNTER — Other Ambulatory Visit: Payer: Self-pay | Admitting: Neurology

## 2021-12-06 ENCOUNTER — Encounter: Payer: Self-pay | Admitting: Neurology

## 2021-12-06 MED ORDER — ZOLMITRIPTAN 5 MG PO TABS: ORAL_TABLET | ORAL | 11 refills | Status: DC

## 2021-12-12 NOTE — Progress Notes (Signed)
Chief Complaint:   OBESITY Charlene [name] is here to discuss her progress with her obesity treatment plan along with follow-up of her obesity related diagnoses. Charlene [name] is on keeping a food journal and adhering to recommended goals of 1800-2000 calories and 120+ grams of protein and states she is following her eating plan approximately 90% of the time. Charlene [name] states she is doing 0 minutes 0 times per week.  Today's visit was #: 9 Starting weight: 250 lbs Starting date: 03/29/2021 Today's weight: 231 lbs Today's date: 11/29/2021 Total lbs lost to date: 19 Total lbs lost since last in-office visit: 5  Interim History: Charlene [name] has done well with her weight loss. Her calories range between 1800-2000, and she is focusing on getting 100-120+ grams of protein daily. She is drinking some protein shakes.   Subjective:   1. Other hyperlipidemia Charlene [name] is taking Lipitor with no side effects noted. Her last LDL was 77, triglycerides 106, and HDL 48. All have much improved and at goal. I discussed labs with the patient today.   2. Vitamin D deficiency Charlene [name] is taking Vitamin D prescription, with no side effects noted. Her last Vitamin D level was 68, at goal. I discussed labs with the patient today.   3. Insulin resistance Charlene [name]'s last A1c was 5.1 and insulin level was 15.6, stable. I discussed labs with the patient today.   4. Elevated LFTs Charlene [name]'s last AST was 41 and ALT 45, both are improving. I discussed labs with the patient today.   Assessment/Plan:   1. Other hyperlipidemia Charlene [name] will continue Lipitor, and she will continue her eating plan and exercise.   2. Vitamin D deficiency Charlene [name] will continue prescription Vitamin D, and we will recheck her level again in several months to avoid over-supplementation.   3. Insulin resistance Charlene [name] will continue her eating plan and exercise.   4. Elevated LFTs Charlene [name] will continue with her healthy eating plan and exercise.   5. Obesity, Current BMI  37.3 Charlene [name] is currently in the action stage of change. As such, her goal is to continue with weight loss efforts. She has agreed to the Category 4 Plan.   Exercise goals: All adults should avoid inactivity. Some physical activity is better than none, and adults who participate in any amount of physical activity gain some health benefits.  Behavioral modification strategies: increasing lean protein intake, decreasing simple carbohydrates, and holiday eating strategies .  Charlene [name] has agreed to follow-up with our clinic in 2 to 3 weeks. She was informed of the importance of frequent follow-up visits to maximize her success with intensive lifestyle modifications for her multiple health conditions.   Objective:   Blood pressure 131/81, pulse (!) 104, temperature 98.2 F (36.8 C), height 5\' 6"  (1.676 m), weight 231 lb (104.8 kg), last menstrual period 11/04/2021, SpO2 98 %. Body mass index is 37.28 kg/m.  General: Cooperative, alert, well developed, in no acute distress. HEENT: Conjunctivae and lids unremarkable. Cardiovascular: Regular rhythm.  Lungs: Normal work of breathing. Neurologic: No focal deficits.   Lab Results  Component Value Date   CREATININE 0.81 11/08/2021   BUN 8 11/08/2021   NA 138 11/08/2021   K 4.3 11/08/2021   CL 101 11/08/2021   CO2 25 11/08/2021   Lab Results  Component Value Date   ALT 45 (H) 11/08/2021   AST 41 (H) 11/08/2021   ALKPHOS 59 11/08/2021   BILITOT <0.2 11/08/2021   Lab Results  Component Value Date   HGBA1C 5.1 11/08/2021  HGBA1C 5.4 03/29/2021   HGBA1C 4.9 05/05/2018   Lab Results  Component Value Date   INSULIN 15.6 11/08/2021   INSULIN 15.6 03/29/2021   Lab Results  Component Value Date   TSH 1.480 03/29/2021   Lab Results  Component Value Date   CHOL 144 11/08/2021   HDL 48 11/08/2021   LDLCALC 77 11/08/2021   LDLDIRECT 236.0 12/22/2019   TRIG 106 11/08/2021   CHOLHDL 3 06/17/2020   Lab Results  Component Value Date    VD25OH 68.0 11/08/2021   VD25OH 19.20 (L) 06/17/2020   VD25OH 35 09/05/2009   Lab Results  Component Value Date   WBC 8.6 06/17/2020   HGB 12.0 06/17/2020   HCT 34.5 (L) 06/17/2020   MCV 96.2 06/17/2020   PLT 315.0 06/17/2020   No results found for: "IRON", "TIBC", "FERRITIN"  Attestation Statements:   Reviewed by clinician on day of visit: allergies, medications, problem list, medical history, surgical history, family history, social history, and previous encounter notes.   I, Burt Knack, am acting as transcriptionist for Quillian Quince, MD.  I have reviewed the above documentation for accuracy and completeness, and I agree with the above. -  Quillian Quince, MD

## 2021-12-20 ENCOUNTER — Encounter (INDEPENDENT_AMBULATORY_CARE_PROVIDER_SITE_OTHER): Payer: Self-pay | Admitting: Family Medicine

## 2021-12-20 ENCOUNTER — Ambulatory Visit (INDEPENDENT_AMBULATORY_CARE_PROVIDER_SITE_OTHER): Payer: 59 | Admitting: Family Medicine

## 2021-12-20 VITALS — BP 121/77 | HR 102 | Temp 98.3°F | Ht 66.0 in | Wt 229.0 lb

## 2021-12-20 DIAGNOSIS — Z6837 Body mass index (BMI) 37.0-37.9, adult: Secondary | ICD-10-CM

## 2021-12-20 DIAGNOSIS — F319 Bipolar disorder, unspecified: Secondary | ICD-10-CM | POA: Diagnosis not present

## 2021-12-20 DIAGNOSIS — E669 Obesity, unspecified: Secondary | ICD-10-CM | POA: Diagnosis not present

## 2022-01-01 NOTE — Progress Notes (Signed)
Chief Complaint:   OBESITY Charlene [name] is here to discuss her progress with her obesity treatment plan along with follow-up of her obesity related diagnoses. Charlene [name] is on the Category 4 Plan and states she is following her eating plan approximately 20% of the time. [name] states she is doing 0 minutes 0 times per week.  Today's visit was #: 10 Starting weight: 250 lbs Starting date: 03/29/2021 Today's weight: 229 lbs Today's date: 12/20/2021 Total lbs lost to date: 21 Total lbs lost since last in-office visit: 2  Interim History: [name] did well with avoiding holiday weight gain over Thanksgiving. She is concerned about Christmas weight gain, especially with extra celebrations around the holidays.   Subjective:   1. Bipolar affective disorder, remission status unspecified (HCC) Charlene [name] has been more manic lately and she has been very physically active. She follows-up with her Psychiatrist and she is starting to normalize after increasing Lamictal from 150 mg to 200 mg.   Assessment/Plan:   1. Bipolar affective disorder, remission status unspecified (HCC) Charlene [name] will continue to be mindful of her behaviors, and will continue to monitor, she will work on healthy eating behaviors.   2. Obesity, Current BMI 37.1 Charlene [name] is currently in the action stage of change. As such, her goal is to continue with weight loss efforts. She has agreed to the Category 4 Plan.   Behavioral modification strategies: increasing lean protein intake and holiday eating strategies .  Charlene [name] has agreed to follow-up with our clinic in 4 weeks. She was informed of the importance of frequent follow-up visits to maximize her success with intensive lifestyle modifications for her multiple health conditions.   Objective:   Blood pressure 121/77, pulse (!) 102, temperature 98.3 F (36.8 C), height 5\' 6"  (1.676 m), weight 229 lb (103.9 kg), SpO2 97 %. Body mass index is 36.96 kg/m.  General: Cooperative, alert, well  developed, in no acute distress. HEENT: Conjunctivae and lids unremarkable. Cardiovascular: Regular rhythm.  Lungs: Normal work of breathing. Neurologic: No focal deficits.   Lab Results  Component Value Date   CREATININE 0.81 11/08/2021   BUN 8 11/08/2021   NA 138 11/08/2021   K 4.3 11/08/2021   CL 101 11/08/2021   CO2 25 11/08/2021   Lab Results  Component Value Date   ALT 45 (H) 11/08/2021   AST 41 (H) 11/08/2021   ALKPHOS 59 11/08/2021   BILITOT <0.2 11/08/2021   Lab Results  Component Value Date   HGBA1C 5.1 11/08/2021   HGBA1C 5.4 03/29/2021   HGBA1C 4.9 05/05/2018   Lab Results  Component Value Date   INSULIN 15.6 11/08/2021   INSULIN 15.6 03/29/2021   Lab Results  Component Value Date   TSH 1.480 03/29/2021   Lab Results  Component Value Date   CHOL 144 11/08/2021   HDL 48 11/08/2021   LDLCALC 77 11/08/2021   LDLDIRECT 236.0 12/22/2019   TRIG 106 11/08/2021   CHOLHDL 3 06/17/2020   Lab Results  Component Value Date   VD25OH 68.0 11/08/2021   VD25OH 19.20 (L) 06/17/2020   VD25OH 35 09/05/2009   Lab Results  Component Value Date   WBC 8.6 06/17/2020   HGB 12.0 06/17/2020   HCT 34.5 (L) 06/17/2020   MCV 96.2 06/17/2020   PLT 315.0 06/17/2020   No results found for: "IRON", "TIBC", "FERRITIN"  Attestation Statements:   Reviewed by clinician on day of visit: allergies, medications, problem list, medical history, surgical history, family history, social history, and  previous encounter notes.  Time spent on visit including pre-visit chart review and post-visit care and charting was 42 minutes.   I, Burt Knack, am acting as transcriptionist for Quillian Quince, MD.  I have reviewed the above documentation for accuracy and completeness, and I agree with the above. -  Quillian Quince, MD

## 2022-01-03 ENCOUNTER — Ambulatory Visit (INDEPENDENT_AMBULATORY_CARE_PROVIDER_SITE_OTHER): Payer: 59 | Admitting: Family Medicine

## 2022-01-03 ENCOUNTER — Encounter (INDEPENDENT_AMBULATORY_CARE_PROVIDER_SITE_OTHER): Payer: Self-pay | Admitting: Family Medicine

## 2022-01-03 VITALS — BP 126/83 | HR 93 | Temp 98.4°F | Ht 66.0 in | Wt 228.0 lb

## 2022-01-03 DIAGNOSIS — E7849 Other hyperlipidemia: Secondary | ICD-10-CM | POA: Diagnosis not present

## 2022-01-03 DIAGNOSIS — Z6836 Body mass index (BMI) 36.0-36.9, adult: Secondary | ICD-10-CM | POA: Diagnosis not present

## 2022-01-03 DIAGNOSIS — E669 Obesity, unspecified: Secondary | ICD-10-CM

## 2022-01-03 DIAGNOSIS — E559 Vitamin D deficiency, unspecified: Secondary | ICD-10-CM | POA: Diagnosis not present

## 2022-01-03 MED ORDER — VITAMIN D (ERGOCALCIFEROL) 1.25 MG (50000 UNIT) PO CAPS: 50000.0000 [IU] | ORAL_CAPSULE | ORAL | 3 refills | Status: DC

## 2022-01-23 NOTE — Progress Notes (Signed)
Chief Complaint:   OBESITY Charlene [name] is here to discuss her progress with her obesity treatment plan along with follow-up of her obesity related diagnoses. Charlene [name] is on the Category 4 Plan and states she is following her eating plan approximately 85% of the time. Charlene [name] states she is doing 0 minutes 0 times per week.  Today's visit was #: 11 Starting weight: 250 lbs Starting date: 03/29/2021 Today's weight: 228 lbs Today's date: 01/03/2022 Total lbs lost to date: 22 Total lbs lost since last in-office visit: 1  Interim History: Charlene [name] has done well with weight loss in the last month. Her hunger has been stable and she has done well with increasing her protein. She is open to discussing Christmas eating strategies.   Subjective:   1. Other hyperlipidemia Charlene [name] is on Lipitor but she is working on decreasing cholesterol in her diet to control.  2. Vitamin D deficiency Charlene [name] is stable on vitamin D with no signs of over replacement.  Assessment/Plan:   1. Other hyperlipidemia Charlene [name] was encouraged to continue to work on weight loss as this is the most reliable way to improve her cholesterol level.  We will continue to monitor closely.  2. Vitamin D deficiency We will refill prescription Vitamin D 50,000 IU every week for 90 days. Charlene [name] will follow-up for routine testing of Vitamin D, at least 2-3 times per year to avoid over-replacement.  - Vitamin D, Ergocalciferol, (DRISDOL) 1.25 MG (50000 UNIT) CAPS capsule; Take 1 capsule (50,000 Units total) by mouth every 7 (seven) days.  Dispense: 12 capsule; Refill: 3  3. Obesity, Current BMI 36.9 Charlene [name] is currently in the action stage of change. As such, her goal is to continue with weight loss efforts. She has agreed to the Category 4 Plan.   Behavioral modification strategies: travel eating strategies and holiday eating strategies .  Charlene [name] has agreed to follow-up with our clinic in 3 weeks. She was informed of the importance of frequent  follow-up visits to maximize her success with intensive lifestyle modifications for her multiple health conditions.   Objective:   Blood pressure 126/83, pulse 93, temperature 98.4 F (36.9 C), height 5\' 6"  (1.676 m), weight 228 lb (103.4 kg), SpO2 99 %. Body mass index is 36.8 kg/m.  General: Cooperative, alert, well developed, in no acute distress. HEENT: Conjunctivae and lids unremarkable. Cardiovascular: Regular rhythm.  Lungs: Normal work of breathing. Neurologic: No focal deficits.   Lab Results  Component Value Date   CREATININE 0.81 11/08/2021   BUN 8 11/08/2021   NA 138 11/08/2021   K 4.3 11/08/2021   CL 101 11/08/2021   CO2 25 11/08/2021   Lab Results  Component Value Date   ALT 45 (H) 11/08/2021   AST 41 (H) 11/08/2021   ALKPHOS 59 11/08/2021   BILITOT <0.2 11/08/2021   Lab Results  Component Value Date   HGBA1C 5.1 11/08/2021   HGBA1C 5.4 03/29/2021   HGBA1C 4.9 05/05/2018   Lab Results  Component Value Date   INSULIN 15.6 11/08/2021   INSULIN 15.6 03/29/2021   Lab Results  Component Value Date   TSH 1.480 03/29/2021   Lab Results  Component Value Date   CHOL 144 11/08/2021   HDL 48 11/08/2021   LDLCALC 77 11/08/2021   LDLDIRECT 236.0 12/22/2019   TRIG 106 11/08/2021   CHOLHDL 3 06/17/2020   Lab Results  Component Value Date   VD25OH 68.0 11/08/2021   VD25OH 19.20 (L) 06/17/2020   VD25OH 35  09/05/2009   Lab Results  Component Value Date   WBC 8.6 06/17/2020   HGB 12.0 06/17/2020   HCT 34.5 (L) 06/17/2020   MCV 96.2 06/17/2020   PLT 315.0 06/17/2020   No results found for: "IRON", "TIBC", "FERRITIN"  Attestation Statements:   Reviewed by clinician on day of visit: allergies, medications, problem list, medical history, surgical history, family history, social history, and previous encounter notes.   I, Burt Knack, am acting as transcriptionist for Quillian Quince, MD.  I have reviewed the above documentation for accuracy and  completeness, and I agree with the above. -  Quillian Quince, MD

## 2022-01-25 ENCOUNTER — Ambulatory Visit (INDEPENDENT_AMBULATORY_CARE_PROVIDER_SITE_OTHER): Payer: 59 | Admitting: Family Medicine

## 2022-01-25 ENCOUNTER — Encounter (INDEPENDENT_AMBULATORY_CARE_PROVIDER_SITE_OTHER): Payer: Self-pay | Admitting: Family Medicine

## 2022-01-25 VITALS — BP 137/74 | HR 115 | Temp 98.5°F | Ht 66.0 in | Wt 227.0 lb

## 2022-01-25 DIAGNOSIS — F439 Reaction to severe stress, unspecified: Secondary | ICD-10-CM

## 2022-01-25 DIAGNOSIS — M549 Dorsalgia, unspecified: Secondary | ICD-10-CM

## 2022-01-25 DIAGNOSIS — Z6836 Body mass index (BMI) 36.0-36.9, adult: Secondary | ICD-10-CM

## 2022-01-25 DIAGNOSIS — E669 Obesity, unspecified: Secondary | ICD-10-CM | POA: Diagnosis not present

## 2022-01-30 ENCOUNTER — Ambulatory Visit: Payer: 59 | Admitting: Family Medicine

## 2022-02-05 NOTE — Progress Notes (Unsigned)
Chief Complaint:   OBESITY Charlene [name] is here to discuss her progress with her obesity treatment plan along with follow-up of her obesity related diagnoses. Charlene [name] is on the Category 4 Plan and states she is following her eating plan approximately 50% of the time. Charlene [name] states she is doing 0 minutes 0 times per week.  Today's visit was #: 12 Starting weight: 250 lbs Starting date: 03/29/2021 Today's weight: 227 lbs Today's date: 01/25/2022 Total lbs lost to date: 23 Total lbs lost since last in-office visit: 1  Interim History: Charlene [name] has done very well with avoiding holiday weight gain.  She is working on getting back to journaling and she is thinking about adding exercise.  Subjective:   1. Stress Charlene [name] had a stressful holiday with a lot of traveling and celebrations and family drama.  She felt overwhelmed and she is working on getting into a slower pace.  2. Other acute back pain Charlene [name] notes increased pain for 3 to 4 months, worse with prolonged standing or worse in left mid back, and she has a burning/numbing feeling.  She denies improvement with NSAIDs, but improves with lying down.  Assessment/Plan:   1. Stress We discussed having a good support system.  Encouragement was provided.  2. Other acute back pain We will refer Charlene [name] to Dr. Georgina Snell for evaluation.  - Ambulatory referral to Sports Medicine  3. Obesity, Current BMI 36.7 Charlene [name] is currently in the action stage of change. As such, her goal is to continue with weight loss efforts. She has agreed to keeping a food journal and adhering to recommended goals of 1800 calories and 120+ grams of protein daily.   Exercise goals: Chair yoga or start walking.   Behavioral modification strategies: increasing lean protein intake.  Aamira has agreed to follow-up with our clinic in 4 weeks. She was informed of the importance of frequent follow-up visits to maximize her success with intensive lifestyle modifications for her multiple  health conditions.   Objective:   Blood pressure 137/74, pulse (!) 115, temperature 98.5 F (36.9 C), height 5\' 6"  (1.676 m), weight 227 lb (103 kg), SpO2 98 %. Body mass index is 36.64 kg/m.  General: Cooperative, alert, well developed, in no acute distress. HEENT: Conjunctivae and lids unremarkable. Cardiovascular: Regular rhythm.  Lungs: Normal work of breathing. Neurologic: No focal deficits.   Lab Results  Component Value Date   CREATININE 0.81 11/08/2021   BUN 8 11/08/2021   NA 138 11/08/2021   K 4.3 11/08/2021   CL 101 11/08/2021   CO2 25 11/08/2021   Lab Results  Component Value Date   ALT 45 (H) 11/08/2021   AST 41 (H) 11/08/2021   ALKPHOS 59 11/08/2021   BILITOT <0.2 11/08/2021   Lab Results  Component Value Date   HGBA1C 5.1 11/08/2021   HGBA1C 5.4 03/29/2021   HGBA1C 4.9 05/05/2018   Lab Results  Component Value Date   INSULIN 15.6 11/08/2021   INSULIN 15.6 03/29/2021   Lab Results  Component Value Date   TSH 1.480 03/29/2021   Lab Results  Component Value Date   CHOL 144 11/08/2021   HDL 48 11/08/2021   LDLCALC 77 11/08/2021   LDLDIRECT 236.0 12/22/2019   TRIG 106 11/08/2021   CHOLHDL 3 06/17/2020   Lab Results  Component Value Date   VD25OH 68.0 11/08/2021   VD25OH 19.20 (L) 06/17/2020   VD25OH 35 09/05/2009   Lab Results  Component Value Date   WBC 8.6 06/17/2020  HGB 12.0 06/17/2020   HCT 34.5 (L) 06/17/2020   MCV 96.2 06/17/2020   PLT 315.0 06/17/2020   No results found for: "IRON", "TIBC", "FERRITIN"  Attestation Statements:   Reviewed by clinician on day of visit: allergies, medications, problem list, medical history, surgical history, family history, social history, and previous encounter notes.   I, Trixie Dredge, am acting as transcriptionist for Dennard Nip, MD.  I have reviewed the above documentation for accuracy and completeness, and I agree with the above. -  Dennard Nip, MD

## 2022-02-06 ENCOUNTER — Encounter: Payer: Self-pay | Admitting: Neurology

## 2022-02-06 NOTE — Telephone Encounter (Signed)
Per Dr.Sater "Since conservative measures have not helped -- lets send in ropinirole 0.5 mg #60   one or two at bedtime.    5 refill"

## 2022-02-07 MED ORDER — ROPINIROLE HCL 0.5 MG PO TABS: ORAL_TABLET | ORAL | 5 refills | Status: DC

## 2022-02-08 NOTE — Progress Notes (Signed)
Rito Ehrlich, am serving as a Education administrator for Dr. Lynne Leader.  Subjective:    CC: Back pain  HPI: Patient is a 42 year old female presenting with back pain ongoing for 3-4 months.  Patient locates pain to Left middle back, the pain is usually in the same circular area on her back but last night seemed like the pain started creeping a bit higher on her back. Pain is not constant and is not sure what actually causes the pain. No MOI. Patient has noticed the pain is starting to get more frequent.  Starts off as a numb patch, then will go to a burning feeling. Is patient is on her feet a lot then that's when she feels the pain   Radiating pain:no LE numbness/tingling: only because of her MS  LE weakness: no  Aggravates: Prolonged standing Treatments tried: bear back and body, massages, naproxen, massage chair. Better shoes, stopped walking bare foot. Nothing seemed to have helped.   Additionally she has some pain in the right lateral hip.  This occurs when she stands from a seated position and with prolonged walking.  Pain is located the lateral hip and radiates down to the lateral thigh.  Worse with activity better with rest.  No pain radiating below the level of the knee.  No injury history.  Additionally she has restless leg syndrome symptoms.  She notes a creepy crawly sensation both legs at night that interfere with sleep.  She has been prescribed Requip by her providers for this but is afraid to take it as she read that Requip can cause mania and she has a history of bipolar.   Pertinent review of Systems: No fevers or chills.  Positive for right lateral hip pain and restless leg syndrome symptoms.  Relevant historical information: Bipolar disorder, controlled MS, iron deficiency anemia seen on lab obtained July 2023.  No recent iron store labs visible in the chart   Objective:    Vitals:   02/09/22 1058  BP: 130/82  Pulse: 96  SpO2: 98%   General: Well Developed, well  nourished, and in no acute distress.   MSK: L-spine: Normal appearing Nontender palpation spinal midline. Mildly tender palpation left lumbar paraspinal musculature. Decreased lumbar motion. Lower extremity strength is intact except noted below.   Right hip: Normal appearing Normal motion. Some pain and stretch with crossover stretch and figure 4 stretch felt in the right lateral and posterior buttocks. Hip abduction and external rotation strength mildly diminished 4+/5. Leg lengths are equal  Lab and Radiology Results  X-ray images T-spine and L-spine obtained today personally and independently interpreted  T-spine: Mild degenerative changes.  No acute fractures are visible.  L-spine: Minimal DJD.  No acute fractures.  No significant malalignment.  Await formal radiology review  CBC" 07/19/21 WBC 4.4 - 11.0 x 10*3/uL 3.8 Low   RBC 4.10 - 5.10 x 10*6/uL 3.70 Low   Hemoglobin 12.3 - 15.3 G/DL 10.4 Low   Hematocrit 35.9 - 44.6 % 31.1 Low   MCV 80.0 - 96.0 FL 84.1  MCH 27.5 - 33.2 PG 28.0  MCHC 33.0 - 37.0 G/DL 33.4  RDW 12.3 - 17.0 % 13.9  Platelets 150 - 450 X 10*3/uL 338  MPV 6.8 - 10.2 FL 8.1  Neutrophil % % 67  Lymphocyte % % 17  Monocyte % % 11  Eosinophil % % 4  Basophil % % 1  Neutrophil Absolute 1.8 - 7.8 x 10*3/uL 2.6  Lymphocyte Absolute 1.0 - 4.8  x 10*3/uL 0.7 Low   Monocyte Absolute 0.0 - 0.8 x 10*3/uL 0.4  Eosinophil Absolute 0.0 - 0.5 x 10*3/uL 0.2  Basophil Absolute 0.0 - 0.2 x 10*3/uL 0.0  nRBC <=0 x 10*3/uL 0    Impression and Recommendations:    Assessment and Plan: 42 y.o. female with  Chronic left low back pain.  Patient describes a burning sensation associate with pain with prolonged standing.  I think this is most likely to be muscle dysfunction and spasm.  I do not think there is a nerve component to explain her pain.  She does have MS so that is always a possibility but her symptoms and physical exam are much more consistent with muscle  dysfunction.  Thoracic radiculopathy is on the differential but again less likely.  Plan for trial of physical therapy.  Check back in 6 weeks.  Additionally she has right lateral hip pain thought to be hip abductor tendinopathy or trochanteric bursitis.  Again plan to treat with physical therapy.  Check back 6 weeks.  She mentions restless leg syndrome.  She is rightfully concerned about taking Requip especially with a history of bipolar disorder.  I think the actual risk of mania is pretty low but the consequences of mania are very high.  She does have a relevant lab finding in her chart of anemia with a hemoglobin of less than 11 in July.  I do not see that iron stores have been checked and iron deficiency anemia can worsen or cause restless leg syndrome.  Will go ahead and check iron stores and if low encourage oral repletion and if that does not work would encourage IV iron repletion.  This has a pretty good chance of improving restless leg syndrome symptoms without Requip if iron deficiency anemia is found to be true.  PDMP not reviewed this encounter. Orders Placed This Encounter  Procedures   DG Lumbar Spine 2-3 Views    Standing Status:   Future    Number of Occurrences:   1    Standing Expiration Date:   03/11/2022    Order Specific Question:   Reason for Exam (SYMPTOM  OR DIAGNOSIS REQUIRED)    Answer:   Low back pain    Order Specific Question:   Preferred imaging location?    Answer:   [facility]    Order Specific Question:   Is patient pregnant?    Answer:   No   DG Thoracic Spine 2 View    Standing Status:   Future    Number of Occurrences:   1    Standing Expiration Date:   02/10/2023    Order Specific Question:   Reason for Exam (SYMPTOM  OR DIAGNOSIS REQUIRED)    Answer:   back pain    Order Specific Question:   Is patient pregnant?    Answer:   No    Order Specific Question:   Preferred imaging location?    Answer:   [facility]   Iron, TIBC and  Ferritin Panel    Standing Status:   Future    Number of Occurrences:   1    Standing Expiration Date:   02/10/2023   Ambulatory referral to Physical Therapy    Referral Priority:   Routine    Referral Type:   Physical Medicine    Referral Reason:   Specialty Services Required    Requested Specialty:   Physical Therapy    Number of Visits Requested:  1   No orders of the defined types were placed in this encounter.   Discussed warning signs or symptoms. Please see discharge instructions. Patient expresses understanding.   The above documentation has been reviewed and is accurate and complete Lynne Leader, M.D.

## 2022-02-09 ENCOUNTER — Ambulatory Visit (INDEPENDENT_AMBULATORY_CARE_PROVIDER_SITE_OTHER): Payer: 59

## 2022-02-09 ENCOUNTER — Ambulatory Visit (INDEPENDENT_AMBULATORY_CARE_PROVIDER_SITE_OTHER): Payer: 59 | Admitting: Family Medicine

## 2022-02-09 VITALS — BP 130/82 | HR 96 | Ht 66.0 in | Wt 222.0 lb

## 2022-02-09 DIAGNOSIS — G2581 Restless legs syndrome: Secondary | ICD-10-CM

## 2022-02-09 DIAGNOSIS — M76891 Other specified enthesopathies of right lower limb, excluding foot: Secondary | ICD-10-CM

## 2022-02-09 DIAGNOSIS — M546 Pain in thoracic spine: Secondary | ICD-10-CM

## 2022-02-09 DIAGNOSIS — D509 Iron deficiency anemia, unspecified: Secondary | ICD-10-CM | POA: Diagnosis not present

## 2022-02-09 NOTE — Patient Instructions (Signed)
Thank you for coming in today.   Please get an Xray today before you leave   I've referred you to Physical Therapy.  Let us know if you don't hear from them in one week.   Please get labs today before you leave   Recheck in 6 weeks.   Let me know if this is not working.   Let me know if you get that MRI before you come back.

## 2022-02-10 LAB — IRON,TIBC AND FERRITIN PANEL
%SAT: 3 % (calc) — ABNORMAL LOW (ref 16–45)
Ferritin: 2 ng/mL — ABNORMAL LOW (ref 16–232)
Iron: 15 ug/dL — ABNORMAL LOW (ref 40–190)
TIBC: 457 mcg/dL (calc) — ABNORMAL HIGH (ref 250–450)

## 2022-02-12 ENCOUNTER — Other Ambulatory Visit: Payer: Self-pay

## 2022-02-12 ENCOUNTER — Encounter: Payer: Self-pay | Admitting: Family Medicine

## 2022-02-12 DIAGNOSIS — D509 Iron deficiency anemia, unspecified: Secondary | ICD-10-CM

## 2022-02-12 NOTE — Progress Notes (Signed)
Lumbar spine x-ray shows a little bit of arthritis.

## 2022-02-12 NOTE — Progress Notes (Signed)
You have significant iron deficiency.  Please immediately start oral iron.  Recommend over-the-counter iron sulfate pills or iron bisglycinate pills (easier to tolerate) 1-3 times daily is much as you can tolerate.  We should recheck this in 1 month.  If not better or you cannot tolerate you should get IV iron repletion.  I suspect this may be contributing to your restless leg syndrome.

## 2022-02-12 NOTE — Progress Notes (Signed)
Thoracic spine x-ray shows multilevel arthritis

## 2022-02-13 ENCOUNTER — Ambulatory Visit (INDEPENDENT_AMBULATORY_CARE_PROVIDER_SITE_OTHER): Payer: 59 | Admitting: Physical Therapy

## 2022-02-13 DIAGNOSIS — M25551 Pain in right hip: Secondary | ICD-10-CM

## 2022-02-13 DIAGNOSIS — M5459 Other low back pain: Secondary | ICD-10-CM | POA: Diagnosis not present

## 2022-02-13 DIAGNOSIS — M6281 Muscle weakness (generalized): Secondary | ICD-10-CM

## 2022-02-13 DIAGNOSIS — M546 Pain in thoracic spine: Secondary | ICD-10-CM | POA: Diagnosis not present

## 2022-02-13 NOTE — Therapy (Signed)
OUTPATIENT PHYSICAL THERAPY THORACOLUMBAR EVALUATION   Patient Name: Jacinta Penalver MRN: [id number] DOB:1980/09/03, 42 y.o., female Today's Date: 02/13/2022  END OF SESSION:  PT End of Session - 02/16/22 1939     Visit Number 1    Number of Visits 16    Date for PT Re-Evaluation 04/10/22    Authorization Type [employer]    PT Start Time 1104    PT Stop Time 1145    PT Time Calculation (min) 41 min    Activity Tolerance Patient tolerated treatment well    Behavior During Therapy WFL for tasks assessed/performed             Past Medical History:  Diagnosis Date   Alcoholism ([place])    Anxiety    Anxiety and depression    Bipolar 2 disorder ([place])    Borderline personality disorder ([place])    Chest pain    Chronic fatigue syndrome    Claustrophobia    on meds   Constipation    Depression    on meds   Elevated blood sugar 06/08/2014   Elevated LFTs    Fatigue    Fatty liver 10/2014   GERD (gastroesophageal reflux disease)    diet related   Heart murmur    Hyperlipidemia    on meds   Infertility associated with anovulation    Joint pain    Low grade squamous intraepithelial lesion (LGSIL) on Pap smear 10/30/2011   Major depressive disorder    Migraines    MS (multiple sclerosis) ([place])    Neuromuscular disorder ([place])    Obesity    Obesity in pregnancy, antepartum    PCOS (polycystic ovarian syndrome)    Personal history of pre-term labor    Prediabetes    Pregnancy induced hypertension    pregnancy related- hx of   Psoriasis    Sleep apnea    SOB (shortness of breath)    Vitamin D deficiency    Past Surgical History:  Procedure Laterality Date   COLONOSCOPY  10/17/2020   DILATION AND CURETTAGE OF UTERUS  08/15/2008   DILATION AND EVACUATION  12/29/2011   Procedure: DILATATION AND EVACUATION;  Surgeon: Mora Bellman, MD;  Location: [facility];  Service: Gynecology;;  Dr, Glo Herring transferred case to Dr. 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   Iron deficiency anemia 02/15/2022   Notalgia 01/25/2022   Stress 01/25/2022   Insulin resistance 11/29/2021   Elevated LFTs 11/08/2021   Pre-hypertension 10/17/2021   Vitamin D deficiency 10/17/2021   SOBOE (shortness of breath on exertion) 10/17/2021   Other hyperlipidemia 08/09/2021   Rosacea 09/15/2020   Physical exam 06/16/2020   Alcohol abuse 05/01/2018   Bipolar disorder ([place]) 06/08/2016   Borderline personality disorder ([place]) 06/08/2016   Obesity (BMI 30-39.9) 12/07/2014   Hyperlipemia 06/08/2014   Elevated blood sugar 06/08/2014   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   Migraines 06/08/2014   Multiple sclerosis ([place])    History of abnormal cervical Pap smear 10/30/2011   Adjustment disorder with anxiety 01/05/2008   Morbid obesity ([place]) 11/26/2007    PCP: Annye Asa  REFERRING PROVIDER: Lynne Leader  REFERRING DIAG: L Thoracic back pain, R hip pain  Rationale for Evaluation and Treatment: Rehabilitation  THERAPY  DIAG:  Other low back pain  Pain in thoracic spine  Muscle weakness (generalized)  Pain in right hip  ONSET DATE:   SUBJECTIVE:                                                                                                                                                                                           SUBJECTIVE STATEMENT: Pt states pain for Several months, no injury to report. She has Increased pain in back -increased with standing/activity around the house.  Most pain in L mid and low back, R lateral hip/thigh- if sitting too long  States pain getting worse in last couple months, pain is becoming more constant.  PMH:  May be getting MRI in near future. For T and or L spine as routine  f/u.  MS: has had tingling in feet before. Had 17 years: more cognitive brain fog, word finding, every 6 moths, gets infusions, Does not need AD, has intermittent numbness/tingling, fatigue.  Going to healthy weight and wellness. Does have equipment at home,  [employer].   PERTINENT HISTORY: MS,  chronic fatigue  PAIN:  Are you having pain? Yes: NPRS scale: up to 9/10 Pain location: L back an R hip  Pain description: Sore, tight, needs to pop Aggravating factors: standing, sitting,  Relieving factors: none stated   PRECAUTIONS: None  WEIGHT BEARING RESTRICTIONS: No  FALLS:  Has patient fallen in last 6 months? No  PLOF: Independent  PATIENT GOALS:  Decreased pain back and hips.   NEXT MD VISIT:   OBJECTIVE:   DIAGNOSTIC FINDINGS:   PATIENT SURVEYS:    COGNITION: Overall cognitive status: Within functional limits for tasks assessed     SENSATION: Altered due to MS,   POSTURE: No Significant postural limitations   PALPATION:  Pain in L mid thoracic with palpation, and radiating pain into L ribs,  Tenderness in R gr troch and glute.   LUMBAR ROM:  Lumbar ROM: WFL Hips: WFL  LOWER EXTREMITY MMT:    MMT Right eval Left eval  Hip flexion 4- 4-  Hip extension 4- 4-  Hip abduction 4- 4-  Hip adduction    Hip internal rotation    Hip external rotation    Knee flexion 5 5  Knee extension 5 5  Ankle dorsiflexion    Ankle plantarflexion    Ankle inversion    Ankle eversion     (Blank rows = not tested)  LUMBAR SPECIAL TESTS:  Neg SLR   FUNCTIONAL TESTS:    TODAY'S TREATMENT:  DATE:   02/13/22:  Therapeutic Exercise: Aerobic: Supine: Seated: Standing: Stretches: Childs pose trial (pt dizzy); Seated childs pose L, R, center, Seated pelvic tilts x 15;  Seated QL stretch 30 sec x 3 on L;  Neuromuscular  Re-education: Manual Therapy: Therapeutic Activity: Self Care:   PATIENT EDUCATION:  Education details: PT POC, Exam findings, HEP,  Person educated: Patient Education method: Explanation, Demonstration, Tactile cues, Verbal cues, and Handouts Education comprehension: verbalized understanding, returned demonstration, verbal cues required, tactile cues required, and needs further education  HOME EXERCISE PROGRAM: Access Code: [email or web address] URL: [email or web address] Date: 02/16/2022 Prepared by: Lyndee Hensen  Exercises - Seated Sidebending Arms Overhead  - 2 x daily - 3 reps - 30 hold - Seated Child's Pose with Table  - 2 x daily - 3 reps - 30 hold - Seated Pelvic Tilt  - 2 x daily - 2 sets - 10 reps  ASSESSMENT:  CLINICAL IMPRESSION: Patient presents with primary complaint of increased pain in thoracic and lumbar region. She has pain in mid T-spine on L, with radiating pain into ribs/torso. She also has pain in R hip. She has mild hypermobility, and instability of hips and core. She has lack of effective HEP for diagnosis of back pain and MS. Pt with decreased ability for full functional activities due to pain , and will benefit from skilled PT to improve.    OBJECTIVE IMPAIRMENTS: decreased activity tolerance, decreased mobility, decreased strength, increased muscle spasms, improper body mechanics, and pain.   ACTIVITY LIMITATIONS: carrying, lifting, bending, sitting, standing, squatting, stairs, and locomotion level  PARTICIPATION LIMITATIONS: cleaning, shopping, community activity, and yard work  PERSONAL FACTORS:  none  are also affecting patient's functional outcome.   REHAB POTENTIAL: Good  CLINICAL DECISION MAKING: Stable/uncomplicated  EVALUATION COMPLEXITY: Low   GOALS: Goals reviewed with patient? Yes  SHORT TERM GOALS: Target date: 02/27/22  Pt to be independent with initial HEP  Goal status: INITIAL  2.  Pt to demo ability to self correct  posture in seated position at least 50 % of the time.   Goal status: INITIAL   LONG TERM GOALS: Target date: 04/10/22  Pt to be independent with final HEP  Goal status: INITIAL  2.  Pt to report decreased pain in back and R hip to 0-3/10 with standing activity and IADLs.   Goal status: INITIAL  3.  Pt to demo improved strength and stability of hips and core to be [facility] for pt age.  Goal status: INITIAL    PLAN:  PT FREQUENCY: 1-2x/week  PT DURATION: 8 weeks  PLANNED INTERVENTIONS: Therapeutic exercises, Therapeutic activity, Neuromuscular re-education, Balance training, Gait training, Patient/Family education, Self Care, Joint mobilization, Joint manipulation, Stair training, Aquatic Therapy, Dry Needling, Electrical stimulation, Spinal manipulation, Spinal mobilization, Cryotherapy, Moist heat, Taping, Traction, Ultrasound, Ionotophoresis 4mg /ml Dexamethasone, and Manual therapy.  PLAN FOR NEXT SESSION:  Lyndee Hensen, PT, DPT 8:43 PM  02/16/22

## 2022-02-15 ENCOUNTER — Encounter (INDEPENDENT_AMBULATORY_CARE_PROVIDER_SITE_OTHER): Payer: Self-pay | Admitting: Family Medicine

## 2022-02-15 ENCOUNTER — Telehealth (INDEPENDENT_AMBULATORY_CARE_PROVIDER_SITE_OTHER): Payer: 59 | Admitting: Family Medicine

## 2022-02-15 DIAGNOSIS — Z6836 Body mass index (BMI) 36.0-36.9, adult: Secondary | ICD-10-CM | POA: Diagnosis not present

## 2022-02-15 DIAGNOSIS — Z9884 Bariatric surgery status: Secondary | ICD-10-CM | POA: Diagnosis not present

## 2022-02-15 DIAGNOSIS — E669 Obesity, unspecified: Secondary | ICD-10-CM | POA: Diagnosis not present

## 2022-02-15 DIAGNOSIS — D509 Iron deficiency anemia, unspecified: Secondary | ICD-10-CM | POA: Diagnosis not present

## 2022-02-15 MED ORDER — IRON (FERROUS SULFATE) 325 (65 FE) MG PO TABS: 325.0000 mg | ORAL_TABLET | Freq: Three times a day (TID) | ORAL | Status: DC

## 2022-02-15 NOTE — Progress Notes (Signed)
TeleHealth Visit:  This visit was completed with telemedicine (audio/video) technology. Charlene [name] has verbally consented to this TeleHealth visit. The patient is located at home, the provider is located at home. The participants in this visit include the listed provider and patient. The visit was conducted today via MyChart video.  OBESITY Charlene [name] is here to discuss her progress with her obesity treatment plan along with follow-up of her obesity related diagnoses.   Today's visit was # 13 Starting weight: 250 lbs Starting date: 03/29/2021 Weight at last in office visit: 227 lbs on 01/25/22 Total weight loss: 23 lbs at last in office visit on 01/25/22. Today's reported weight: 220 lbs   Nutrition Plan:  keeping a food journal and adhering to recommended goals of 1800 calories and 120+ grams of protein daily. .   Current exercise:  PT twice weekly.  Interim History:  Charlene [name] switched to a virtual visit because she is sick with sore throat and headache.  I encouraged her to do a COVID test at home.  She has been doing well with journaling.  She uses paper and pencil. According to her scale at home she is lost 7 pounds since her in office visit on January 11.  She was referred to sports medicine last office visit and has started physical therapy twice weekly.  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  Assessment/Plan:  1.  Status post sleeve gastrectomy (2016) Had sleeve gastrectomy in 2016 by Dr. Alinda [name] [facility].  High weight before surgery 260 pounds.  Nadir 179 pounds.  Did not feel she had much restriction after surgery.  However since attending our program she seems to have more portion restriction.   She is taking a multivitamin daily.  Plan: Continue to focus on protein intake and regular journaling.    2. Iron deficiency anemia Had appointment with Dr. Lynne [name] she mentioned having restless legs and he ordered an anemia panel which showed very low iron,  ferritin and iron saturation. Iron-15, iron saturation 3, ferritin 2. CBC not done but hemoglobin was 10.4 at [facility] in July 2023. Colonoscopy in October 2022 was negative except for internal and external hemorrhoids.  Kids are not especially heavy.  Likely related to sleeve gastrectomy in 2016 to some degree. Dr. Georgina [name] had her start iron 3 times per day.  She is having some mild constipation. Lab Results  Component Value Date   WBC 8.6 06/17/2020   HGB 12.0 06/17/2020   HCT 34.5 (L) 06/17/2020   MCV 96.2 06/17/2020   PLT 315.0 06/17/2020   Lab Results  Component Value Date   IRON 15 (L) 02/09/2022   TIBC 457 (H) 02/09/2022   FERRITIN 2 (L) 02/09/2022     Plan: Continue supplementation at current dose May take MiraLAX daily for constipation. Increase water intake. Check CBC at next office visit if not checked by Dr. Georgina [name] at follow-up with him.   3. Obesity: Current BMI 30 Charlene [name] is currently in the action stage of change. As such, her goal is to continue with weight loss efforts.  She has agreed to  keeping a food journal and adhering to recommended goals of 1800 calories and 120+ grams of protein daily.   Exercise goals:  as is  Behavioral modification strategies: increasing lean protein intake, decreasing simple carbohydrates, increasing water intake, increasing high fiber foods, planning for success, and keeping a strict food journal.  Charlene [name] has agreed to follow-up with our clinic in 4 weeks.   No  orders of the defined types were placed in this encounter.   There are no discontinued medications.   Meds ordered this encounter  Medications   Iron, Ferrous Sulfate, 325 (65 Fe) MG TABS    Sig: Take 325 mg by mouth in the morning, at noon, and at bedtime.    Dispense:  30 tablet    Order Specific Question:   Supervising Provider    Answer:   Charlene [name] [[id number]]      Objective:   VITALS: Per patient if applicable, see vitals. GENERAL: Alert and in no acute  distress. CARDIOPULMONARY: No increased WOB. Speaking in clear sentences.  PSYCH: Pleasant and cooperative. Speech normal rate and rhythm. Affect is appropriate. Insight and judgement are appropriate. Attention is focused, linear, and appropriate.  NEURO: Oriented as arrived to appointment on time with no prompting.   Lab Results  Component Value Date   CREATININE 0.81 11/08/2021   BUN 8 11/08/2021   NA 138 11/08/2021   K 4.3 11/08/2021   CL 101 11/08/2021   CO2 25 11/08/2021   Lab Results  Component Value Date   ALT 45 (H) 11/08/2021   AST 41 (H) 11/08/2021   ALKPHOS 59 11/08/2021   BILITOT <0.2 11/08/2021   Lab Results  Component Value Date   HGBA1C 5.1 11/08/2021   HGBA1C 5.4 03/29/2021   HGBA1C 4.9 05/05/2018   Lab Results  Component Value Date   INSULIN 15.6 11/08/2021   INSULIN 15.6 03/29/2021   Lab Results  Component Value Date   TSH 1.480 03/29/2021   Lab Results  Component Value Date   CHOL 144 11/08/2021   HDL 48 11/08/2021   LDLCALC 77 11/08/2021   LDLDIRECT 236.0 12/22/2019   TRIG 106 11/08/2021   CHOLHDL 3 06/17/2020   Lab Results  Component Value Date   WBC 8.6 06/17/2020   HGB 12.0 06/17/2020   HCT 34.5 (L) 06/17/2020   MCV 96.2 06/17/2020   PLT 315.0 06/17/2020   Lab Results  Component Value Date   IRON 15 (L) 02/09/2022   TIBC 457 (H) 02/09/2022   FERRITIN 2 (L) 02/09/2022   Lab Results  Component Value Date   VD25OH 68.0 11/08/2021   VD25OH 19.20 (L) 06/17/2020   VD25OH 35 09/05/2009    Attestation Statements:   Reviewed by clinician on day of visit: allergies, medications, problem list, medical history, surgical history, family history, social history, and previous encounter notes.  Time spent on visit including the items listed below was 30 minutes.  -preparing to see the patient (e.g., review of tests, history, previous notes) -obtaining and/or reviewing separately obtained history -counseling and educating the  patient/family/caregiver -documenting clinical information in the electronic or other health record -ordering medications, tests, or procedures -independently interpreting results and communicating results to the patient/ family/caregiver -referring and communicating with other health care professionals  -care coordination

## 2022-02-16 ENCOUNTER — Encounter: Payer: Self-pay | Admitting: Physical Therapy

## 2022-02-19 ENCOUNTER — Ambulatory Visit (INDEPENDENT_AMBULATORY_CARE_PROVIDER_SITE_OTHER): Payer: 59 | Admitting: Physical Therapy

## 2022-02-19 ENCOUNTER — Encounter: Payer: Self-pay | Admitting: Physical Therapy

## 2022-02-19 DIAGNOSIS — M6281 Muscle weakness (generalized): Secondary | ICD-10-CM

## 2022-02-19 DIAGNOSIS — M25551 Pain in right hip: Secondary | ICD-10-CM | POA: Diagnosis not present

## 2022-02-19 DIAGNOSIS — M5459 Other low back pain: Secondary | ICD-10-CM

## 2022-02-19 DIAGNOSIS — M546 Pain in thoracic spine: Secondary | ICD-10-CM | POA: Diagnosis not present

## 2022-02-19 NOTE — Therapy (Unsigned)
OUTPATIENT PHYSICAL THERAPY THORACOLUMBAR TREATMENT   Patient Name: Charlene [name] MRN: [id number] DOB:Jun 03, 1980, 42 y.o., female Today's Date: 02/19/2022   END OF SESSION:  PT End of Session - 02/19/22 1347     Visit Number 2    Number of Visits 16    Date for PT Re-Evaluation 04/10/22    Authorization Type [employer]    PT Start Time 1062    PT Stop Time 6948    PT Time Calculation (min) 41 min    Activity Tolerance Patient tolerated treatment well    Behavior During Therapy WFL for tasks assessed/performed             Past Medical History:  Diagnosis Date   Alcoholism ([place])    Anxiety    Anxiety and depression    Bipolar 2 disorder ([place])    Borderline personality disorder ([place])    Chest pain    Chronic fatigue syndrome    Claustrophobia    on meds   Constipation    Depression    on meds   Elevated blood sugar 06/08/2014   Elevated LFTs    Fatigue    Fatty liver 10/2014   GERD (gastroesophageal reflux disease)    diet related   Heart murmur    Hyperlipidemia    on meds   Infertility associated with anovulation    Joint pain    Low grade squamous intraepithelial lesion (LGSIL) on Pap smear 10/30/2011   Major depressive disorder    Migraines    MS (multiple sclerosis) (HCC)    Neuromuscular disorder ([place])    Obesity    Obesity in pregnancy, antepartum    PCOS (polycystic ovarian syndrome)    Personal history of pre-term labor    Prediabetes    Pregnancy induced hypertension    pregnancy related- hx of   Psoriasis    Sleep apnea    SOB (shortness of breath)    Vitamin D deficiency    Past Surgical History:  Procedure Laterality Date   COLONOSCOPY  10/17/2020   DILATION AND CURETTAGE OF UTERUS  08/15/2008   DILATION AND EVACUATION  12/29/2011   Procedure: DILATATION AND EVACUATION;  Surgeon: Mora Bellman, MD;  Location: [facility];  Service: Gynecology;;  Dr, Glo Herring transferred case to Dr. 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   Iron deficiency anemia 02/15/2022   Notalgia 01/25/2022   Stress 01/25/2022   Insulin resistance 11/29/2021   Elevated LFTs 11/08/2021   Pre-hypertension 10/17/2021   Vitamin D deficiency 10/17/2021   SOBOE (shortness of breath on exertion) 10/17/2021   Other hyperlipidemia 08/09/2021   Rosacea 09/15/2020   Physical exam 06/16/2020   Alcohol abuse 05/01/2018   Bipolar disorder ([place]) 06/08/2016   Borderline personality disorder ([place]) 06/08/2016   Obesity (BMI 30-39.9) 12/07/2014   Hyperlipemia 06/08/2014   Elevated blood sugar 06/08/2014   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   Migraines 06/08/2014   Multiple sclerosis ([place])    History of abnormal cervical Pap smear 10/30/2011   Adjustment disorder with anxiety 01/05/2008   Morbid obesity ([place]) 11/26/2007    PCP: Annye Asa  REFERRING PROVIDER: Lynne Leader  REFERRING DIAG: L Thoracic back pain, R hip pain  Rationale for Evaluation and Treatment: Rehabilitation  THERAPY DIAG:  Other low back pain  Pain in thoracic spine  Muscle weakness (generalized)  Pain in right hip  ONSET DATE:   SUBJECTIVE:                                                                                                                                                                                           SUBJECTIVE STATEMENT:  02/19/2022 Pt states she has not had time to do HEP. Still has soreness in ribs/back and R hip.   Eval: Pt states pain for Several months, no injury to report. She has Increased pain in back -increased with standing/activity around the house.  Most pain in L mid and low back, R lateral hip/thigh- if sitting too long  States pain getting worse in last couple months, pain  is becoming more constant.  PMH:  May be getting MRI in near future. For T and or L spine as routine f/u.  MS: has had tingling in feet before. Had 17 years: more cognitive brain fog, word finding, every 6 moths, gets infusions, Does not need AD, has intermittent numbness/tingling, fatigue.  Going to healthy weight and wellness. Does have equipment at home,  [employer].   PERTINENT HISTORY: MS,  chronic fatigue  PAIN:  Are you having pain? Yes: NPRS scale: up to 9/10 Pain location: L back an R hip  Pain description: Sore, tight, needs to pop Aggravating factors: standing, sitting,  Relieving factors: none stated   PRECAUTIONS: None  WEIGHT BEARING RESTRICTIONS: No  FALLS:  Has patient fallen in last 6 months? No  PLOF: Independent  PATIENT GOALS:  Decreased pain back and hips.   NEXT MD VISIT:   OBJECTIVE:   DIAGNOSTIC FINDINGS:   PATIENT SURVEYS:    COGNITION: Overall cognitive status: Within functional limits for tasks assessed     SENSATION: Altered due to MS,   POSTURE: No Significant postural limitations   PALPATION:  Pain in L mid thoracic with palpation, and radiating pain into L ribs,  Tenderness in R gr troch and glute.   LUMBAR ROM:  Lumbar ROM: WFL Hips: WFL  LOWER EXTREMITY MMT:    MMT Right eval Left eval  Hip flexion 4- 4-  Hip extension 4- 4-  Hip abduction 4- 4-  Hip adduction    Hip internal rotation    Hip external rotation    Knee flexion 5 5  Knee extension 5 5  Ankle dorsiflexion    Ankle plantarflexion    Ankle inversion    Ankle eversion     (Blank rows = not tested)  LUMBAR SPECIAL TESTS:  Neg  SLR   FUNCTIONAL TESTS:    TODAY'S TREATMENT:                                                                                                                              DATE:   02/19/22: Therapeutic Exercise: Aerobic: Supine:  Breathing , practice for belly breathing vs chest rise x 10;   Flexion/AAROM for lat  stretch x 10;  TA contraction x 15 with breathing; cuing for achieving optimal contraction;  SLR 2 x 5 with TA bil;  S/L clams 2 x 10;  Seated: Standing: Stretches:;  Seated QL stretch 30 sec x 3 on L;  Neuromuscular Re-education: Manual Therapy: Long leg distraction on R for hip and lumbar; STM/roller to R glute, ITB   02/13/22:  Therapeutic Exercise: Aerobic: Supine: Seated: Standing: Stretches: Childs pose trial (pt dizzy); Seated childs pose L, R, center, Seated pelvic tilts x 15;  Seated QL stretch 30 sec x 3 on L;  Neuromuscular Re-education: Manual Therapy: Therapeutic Activity: Self Care:   PATIENT EDUCATION:  Education details: reviewed and updated HEP Person educated: Patient Education method: Explanation, Demonstration, Tactile cues, Verbal cues, and Handouts Education comprehension: verbalized understanding, returned demonstration, verbal cues required, tactile cues required, and needs further education  HOME EXERCISE PROGRAM: Access Code: [email or web address] URL: [email or web address] Date: 02/16/2022 Prepared by: Lyndee Hensen  Exercises - Seated Sidebending Arms Overhead  - 2 x daily - 3 reps - 30 hold - Seated Child's Pose with Table  - 2 x daily - 3 reps - 30 hold - Seated Pelvic Tilt  - 2 x daily - 2 sets - 10 reps  ASSESSMENT:  CLINICAL IMPRESSION:  02/19/2022 ** focus on breathing and TA   Eval: Patient presents with primary complaint of increased pain in thoracic and lumbar region. She has pain in mid T-spine on L, with radiating pain into ribs/torso. She also has pain in R hip. She has mild hypermobility, and instability of hips and core. She has lack of effective HEP for diagnosis of back pain and MS. Pt with decreased ability for full functional activities due to pain , and will benefit from skilled PT to improve.    OBJECTIVE IMPAIRMENTS: decreased activity tolerance, decreased mobility, decreased strength, increased muscle spasms, improper  body mechanics, and pain.   ACTIVITY LIMITATIONS: carrying, lifting, bending, sitting, standing, squatting, stairs, and locomotion level  PARTICIPATION LIMITATIONS: cleaning, shopping, community activity, and yard work  PERSONAL FACTORS:  none  are also affecting patient's functional outcome.   REHAB POTENTIAL: Good  CLINICAL DECISION MAKING: Stable/uncomplicated  EVALUATION COMPLEXITY: Low   GOALS: Goals reviewed with patient? Yes  SHORT TERM GOALS: Target date: 02/27/22  Pt to be independent with initial HEP  Goal status: INITIAL  2.  Pt to demo ability to self correct posture in seated position at least 50 % of the time.   Goal status: INITIAL   LONG TERM GOALS: Target date: 04/10/22  Pt  to be independent with final HEP  Goal status: INITIAL  2.  Pt to report decreased pain in back and R hip to 0-3/10 with standing activity and IADLs.   Goal status: INITIAL  3.  Pt to demo improved strength and stability of hips and core to be [facility] for pt age.  Goal status: INITIAL    PLAN:  PT FREQUENCY: 1-2x/week  PT DURATION: 8 weeks  PLANNED INTERVENTIONS: Therapeutic exercises, Therapeutic activity, Neuromuscular re-education, Balance training, Gait training, Patient/Family education, Self Care, Joint mobilization, Joint manipulation, Stair training, Aquatic Therapy, Dry Needling, Electrical stimulation, Spinal manipulation, Spinal mobilization, Cryotherapy, Moist heat, Taping, Traction, Ultrasound, Ionotophoresis 4mg /ml Dexamethasone, and Manual therapy.  PLAN FOR NEXT SESSION:    , PT, DPT 2:30 PM  02/19/22

## 2022-02-23 ENCOUNTER — Encounter: Payer: 59 | Admitting: Physical Therapy

## 2022-02-26 ENCOUNTER — Ambulatory Visit (INDEPENDENT_AMBULATORY_CARE_PROVIDER_SITE_OTHER): Payer: 59 | Admitting: Physical Therapy

## 2022-02-26 ENCOUNTER — Encounter: Payer: Self-pay | Admitting: Physical Therapy

## 2022-02-26 DIAGNOSIS — M5459 Other low back pain: Secondary | ICD-10-CM | POA: Diagnosis not present

## 2022-02-26 DIAGNOSIS — M546 Pain in thoracic spine: Secondary | ICD-10-CM

## 2022-02-26 DIAGNOSIS — M6281 Muscle weakness (generalized): Secondary | ICD-10-CM | POA: Diagnosis not present

## 2022-02-26 DIAGNOSIS — M25551 Pain in right hip: Secondary | ICD-10-CM

## 2022-02-26 NOTE — Therapy (Signed)
OUTPATIENT PHYSICAL THERAPY THORACOLUMBAR TREATMENT   Patient Name: Charlene [name] MRN: [id number] DOB:1980-10-26, 42 y.o., female Today's Date: 02/26/2022   END OF SESSION:  PT End of Session - 02/26/22 1605     Visit Number 3    Number of Visits 16    Date for PT Re-Evaluation 04/10/22    Authorization Type UHC    PT Start Time [id number]    PT Stop Time [id number]    PT Time Calculation (min) 40 min    Activity Tolerance Patient tolerated treatment well    Behavior During Therapy WFL for tasks assessed/performed             Past Medical History:  Diagnosis Date   Alcoholism ([place])    Anxiety    Anxiety and depression    Bipolar 2 disorder ([place])    Borderline personality disorder ([place])    Chest pain    Chronic fatigue syndrome    Claustrophobia    on meds   Constipation    Depression    on meds   Elevated blood sugar 06/08/2014   Elevated LFTs    Fatigue    Fatty liver 10/2014   GERD (gastroesophageal reflux disease)    diet related   Heart murmur    Hyperlipidemia    on meds   Infertility associated with anovulation    Joint pain    Low grade squamous intraepithelial lesion (LGSIL) on Pap smear 10/30/2011   Major depressive disorder    Migraines    MS (multiple sclerosis) (HCC)    Neuromuscular disorder ([place])    Obesity    Obesity in pregnancy, antepartum    PCOS (polycystic ovarian syndrome)    Personal history of pre-term labor    Prediabetes    Pregnancy induced hypertension    pregnancy related- hx of   Psoriasis    Sleep apnea    SOB (shortness of breath)    Vitamin D deficiency    Past Surgical History:  Procedure Laterality Date   COLONOSCOPY  10/17/2020   DILATION AND CURETTAGE OF UTERUS  08/15/2008   DILATION AND EVACUATION  12/29/2011   Procedure: DILATATION AND EVACUATION;  Surgeon: Mora Bellman, MD;  Location: [facility];  Service: Gynecology;;  Dr, Glo Herring transferred case to Dr. Elly Modena   LAPAROSCOPIC GASTRIC SLEEVE RESECTION N/A  12/07/2014   Procedure: LAPAROSCOPIC GASTRIC SLEEVE RESECTION;  Surgeon: Alphonsa Overall, MD;  Location: [facility];  Service: General;  Laterality: N/A;   Patient Active Problem List   Diagnosis Date Noted   Status post laparoscopic sleeve gastrectomy (2016) 02/15/2022   Iron deficiency anemia 02/15/2022   Notalgia 01/25/2022   Stress 01/25/2022   Insulin resistance 11/29/2021   Elevated LFTs 11/08/2021   Pre-hypertension 10/17/2021   Vitamin D deficiency 10/17/2021   SOBOE (shortness of breath on exertion) 10/17/2021   Other hyperlipidemia 08/09/2021   Rosacea 09/15/2020   Physical exam 06/16/2020   Alcohol abuse 05/01/2018   Bipolar disorder ([place]) 06/08/2016   Borderline personality disorder ([place]) 06/08/2016   Obesity (BMI 30-39.9) 12/07/2014   Hyperlipemia 06/08/2014   Elevated blood sugar 06/08/2014   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   Migraines 06/08/2014   Multiple sclerosis ([name])    History of abnormal cervical Pap smear 10/30/2011   Adjustment disorder with anxiety 01/05/2008   Morbid obesity ([place]) 11/26/2007    PCP: Annye Asa  REFERRING PROVIDER: Lynne Leader  REFERRING DIAG: L Thoracic back pain, R hip pain  Rationale for Evaluation and Treatment: Rehabilitation  THERAPY DIAG:  Other low back pain  Pain in thoracic spine  Muscle weakness (generalized)  Pain in right hip  ONSET DATE:   SUBJECTIVE:                                                                                                                                                                                           SUBJECTIVE STATEMENT:  02/26/2022 Pt states increased pain in R side of her back in the last few days. She states increased pain in back also after trying her HEP a few times, with more difficulty sleeping in the evenings.    Eval: Pt states pain for Several months, no injury to report. She has Increased pain in back -increased with standing/activity around the house.  Most pain in  L mid and low back, R lateral hip/thigh- if sitting too long  States pain getting worse in last couple months, pain is becoming more constant.  PMH:  May be getting MRI in near future. For T and or L spine as routine f/u.  MS: has had tingling in feet before. Had 17 years: more cognitive brain fog, word finding, every 6 moths, gets infusions, Does not need AD, has intermittent numbness/tingling, fatigue.  Going to healthy weight and wellness. Does have equipment at home,  [employer].   PERTINENT HISTORY: MS,  chronic fatigue  PAIN:  Are you having pain? Yes: NPRS scale: up to 9/10 Pain location: L back an R hip  Pain description: Sore, tight, needs to pop Aggravating factors: standing, sitting,  Relieving factors: none stated   PRECAUTIONS: None  WEIGHT BEARING RESTRICTIONS: No  FALLS:  Has patient fallen in last 6 months? No  PLOF: Independent  PATIENT GOALS:  Decreased pain back and hips.   NEXT MD VISIT:   OBJECTIVE:   DIAGNOSTIC FINDINGS:   PATIENT SURVEYS:    COGNITION: Overall cognitive status: Within functional limits for tasks assessed     SENSATION: Altered due to MS,   POSTURE: No Significant postural limitations   PALPATION:  Pain in L mid thoracic with palpation, and radiating pain into L ribs,  Tenderness in R gr troch and glute.   LUMBAR ROM:  Lumbar ROM: WFL Hips: WFL  LOWER EXTREMITY MMT:    MMT Right eval Left eval  Hip flexion 4- 4-  Hip extension 4- 4-  Hip abduction 4- 4-  Hip adduction    Hip internal rotation    Hip external rotation    Knee flexion 5 5  Knee extension 5 5  Ankle dorsiflexion    Ankle plantarflexion    Ankle inversion  Ankle eversion     (Blank rows = not tested)  LUMBAR SPECIAL TESTS:  Neg SLR   FUNCTIONAL TESTS:    TODAY'S TREATMENT:                                                                                                                              DATE:    02/26/22: Therapeutic Exercise: Aerobic: Supine:  Breathing , practice for belly breathing vs chest rise x 10;   Flexion/AAROM for lat stretch x 10;  Shoulder protraction x 10;  TA contraction x 15 with breathing; cuing for achieving optimal contraction;  Bridging x 10;  Seated: Standing: Stretches:;  Seated QL stretch 30 sec x 3 on L;  LTR x 10  Neuromuscular Re-education: Manual Therapy: Long leg distraction on R for hip and lumbar;  Modalities:  Moist heat pack to low back during supine exercises.    02/19/22: Therapeutic Exercise: Aerobic: Supine:  Breathing , practice for belly breathing vs chest rise x 10;   Flexion/AAROM for lat stretch x 10;  TA contraction x 15 with breathing; cuing for achieving optimal contraction;  SLR 2 x 5 with TA bil;  S/L clams 2 x 10;  Seated: Standing: Stretches:;  Seated QL stretch 30 sec x 3 on L;  Neuromuscular Re-education: Manual Therapy: Long leg distraction on R for hip and lumbar; STM/roller to R glute, ITB   02/13/22:  Therapeutic Exercise: Aerobic: Supine: Seated: Standing: Stretches: Childs pose trial (pt dizzy); Seated childs pose L, R, center, Seated pelvic tilts x 15;  Seated QL stretch 30 sec x 3 on L;  Neuromuscular Re-education: Manual Therapy: Therapeutic Activity: Self Care:   PATIENT EDUCATION:  Education details: reviewed and updated HEP Person educated: Patient Education method: Explanation, Demonstration, Tactile cues, Verbal cues, and Handouts Education comprehension: verbalized understanding, returned demonstration, verbal cues required, tactile cues required, and needs further education  HOME EXERCISE PROGRAM: Access Code: [email or web address]   ASSESSMENT:  CLINICAL IMPRESSION:  02/26/2022 Pt with soreness in back on L and R today. Pt with improved ability for TA contraction, requires cueing to relax neck when performing. Reviewed HEP for correct mechanics to avoid increased pain at home. Pt tearful today due to  ongoing pain . She has another appointment tomorrow , plan for manual to low back as needed.   Eval: Patient presents with primary complaint of increased pain in thoracic and lumbar region. She has pain in mid T-spine on L, with radiating pain into ribs/torso. She also has pain in R hip. She has mild hypermobility, and instability of hips and core. She has lack of effective HEP for diagnosis of back pain and MS. Pt with decreased ability for full functional activities due to pain , and will benefit from skilled PT to improve.    OBJECTIVE IMPAIRMENTS: decreased activity tolerance, decreased mobility, decreased strength, increased muscle spasms, improper body mechanics, and pain.   ACTIVITY LIMITATIONS: carrying, lifting, bending, sitting, standing,  squatting, stairs, and locomotion level  PARTICIPATION LIMITATIONS: cleaning, shopping, community activity, and yard work  PERSONAL FACTORS:  none  are also affecting patient's functional outcome.   REHAB POTENTIAL: Good  CLINICAL DECISION MAKING: Stable/uncomplicated  EVALUATION COMPLEXITY: Low   GOALS: Goals reviewed with patient? Yes  SHORT TERM GOALS: Target date: 02/27/22  Pt to be independent with initial HEP  Goal status: INITIAL  2.  Pt to demo ability to self correct posture in seated position at least 50 % of the time.   Goal status: INITIAL   LONG TERM GOALS: Target date: 04/10/22  Pt to be independent with final HEP  Goal status: INITIAL  2.  Pt to report decreased pain in back and R hip to 0-3/10 with standing activity and IADLs.   Goal status: INITIAL  3.  Pt to demo improved strength and stability of hips and core to be [facility] for pt age.  Goal status: INITIAL    PLAN:  PT FREQUENCY: 1-2x/week  PT DURATION: 8 weeks  PLANNED INTERVENTIONS: Therapeutic exercises, Therapeutic activity, Neuromuscular re-education, Balance training, Gait training, Patient/Family education, Self Care, Joint mobilization, Joint  manipulation, Stair training, Aquatic Therapy, Dry Needling, Electrical stimulation, Spinal manipulation, Spinal mobilization, Cryotherapy, Moist heat, Taping, Traction, Ultrasound, Ionotophoresis 40m/ml Dexamethasone, and Manual therapy.  PLAN FOR NEXT SESSION:    LLyndee Hensen PT, DPT 4:56 PM  02/26/22

## 2022-02-27 ENCOUNTER — Ambulatory Visit (INDEPENDENT_AMBULATORY_CARE_PROVIDER_SITE_OTHER): Payer: 59 | Admitting: Physical Therapy

## 2022-02-27 ENCOUNTER — Encounter: Payer: Self-pay | Admitting: Physical Therapy

## 2022-02-27 DIAGNOSIS — M25551 Pain in right hip: Secondary | ICD-10-CM

## 2022-02-27 DIAGNOSIS — M6281 Muscle weakness (generalized): Secondary | ICD-10-CM

## 2022-02-27 DIAGNOSIS — M546 Pain in thoracic spine: Secondary | ICD-10-CM | POA: Diagnosis not present

## 2022-02-27 DIAGNOSIS — M5459 Other low back pain: Secondary | ICD-10-CM

## 2022-02-27 NOTE — Therapy (Signed)
OUTPATIENT PHYSICAL THERAPY THORACOLUMBAR TREATMENT   Patient Name: Charlene [name] MRN: [id number] DOB:1980-03-16, 42 y.o., female Today's Date: 02/27/2022   END OF SESSION:  PT End of Session - 02/27/22 0847     Visit Number 4    Number of Visits 16    Date for PT Re-Evaluation 04/10/22    Authorization Type UHC    PT Start Time [id number]    PT Stop Time 0930    PT Time Calculation (min) 43 min    Activity Tolerance Patient tolerated treatment well    Behavior During Therapy WFL for tasks assessed/performed             Past Medical History:  Diagnosis Date   Alcoholism ([place])    Anxiety    Anxiety and depression    Bipolar 2 disorder ([place])    Borderline personality disorder ([place])    Chest pain    Chronic fatigue syndrome    Claustrophobia    on meds   Constipation    Depression    on meds   Elevated blood sugar 06/08/2014   Elevated LFTs    Fatigue    Fatty liver 10/2014   GERD (gastroesophageal reflux disease)    diet related   Heart murmur    Hyperlipidemia    on meds   Infertility associated with anovulation    Joint pain    Low grade squamous intraepithelial lesion (LGSIL) on Pap smear 10/30/2011   Major depressive disorder    Migraines    MS (multiple sclerosis) (HCC)    Neuromuscular disorder ([place])    Obesity    Obesity in pregnancy, antepartum    PCOS (polycystic ovarian syndrome)    Personal history of pre-term labor    Prediabetes    Pregnancy induced hypertension    pregnancy related- hx of   Psoriasis    Sleep apnea    SOB (shortness of breath)    Vitamin D deficiency    Past Surgical History:  Procedure Laterality Date   COLONOSCOPY  10/17/2020   DILATION AND CURETTAGE OF UTERUS  08/15/2008   DILATION AND EVACUATION  12/29/2011   Procedure: DILATATION AND EVACUATION;  Surgeon: Mora Bellman, MD;  Location: [facility];  Service: Gynecology;;  Dr, Glo Herring transferred case to Dr. Elly Modena   LAPAROSCOPIC GASTRIC SLEEVE RESECTION N/A  12/07/2014   Procedure: LAPAROSCOPIC GASTRIC SLEEVE RESECTION;  Surgeon: Alphonsa Overall, MD;  Location: [facility];  Service: General;  Laterality: N/A;   Patient Active Problem List   Diagnosis Date Noted   Status post laparoscopic sleeve gastrectomy (2016) 02/15/2022   Iron deficiency anemia 02/15/2022   Notalgia 01/25/2022   Stress 01/25/2022   Insulin resistance 11/29/2021   Elevated LFTs 11/08/2021   Pre-hypertension 10/17/2021   Vitamin D deficiency 10/17/2021   SOBOE (shortness of breath on exertion) 10/17/2021   Other hyperlipidemia 08/09/2021   Rosacea 09/15/2020   Physical exam 06/16/2020   Alcohol abuse 05/01/2018   Bipolar disorder ([place]) 06/08/2016   Borderline personality disorder ([place]) 06/08/2016   Obesity (BMI 30-39.9) 12/07/2014   Hyperlipemia 06/08/2014   Elevated blood sugar 06/08/2014   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   Migraines 06/08/2014   Multiple sclerosis ([place])    History of abnormal cervical Pap smear 10/30/2011   Adjustment disorder with anxiety 01/05/2008   Morbid obesity ([place]) 11/26/2007    PCP: Annye Asa  REFERRING PROVIDER: Lynne Leader  REFERRING DIAG: L Thoracic back pain, R hip pain  Rationale for Evaluation and Treatment: Rehabilitation  THERAPY DIAG:  Other low back pain  Pain in thoracic spine  Muscle weakness (generalized)  Pain in right hip  ONSET DATE:   SUBJECTIVE:                                                                                                                                                                                           SUBJECTIVE STATEMENT:  02/27/2022  Pt states less soreness this am than yesterday. Considering getting a heating pad.   Eval: Pt states pain for Several months, no injury to report. She has Increased pain in back -increased with standing/activity around the house.  Most pain in L mid and low back, R lateral hip/thigh- if sitting too long  States pain getting worse in last couple  months, pain is becoming more constant.  PMH:  May be getting MRI in near future. For T and or L spine as routine f/u.  MS: has had tingling in feet before. Had 17 years: more cognitive brain fog, word finding, every 6 moths, gets infusions, Does not need AD, has intermittent numbness/tingling, fatigue.  Going to healthy weight and wellness. Does have equipment at home,  [employer].   PERTINENT HISTORY: MS,  chronic fatigue  PAIN:  Are you having pain? Yes: NPRS scale: up to 9/10 Pain location: L back an R hip  Pain description: Sore, tight, needs to pop Aggravating factors: standing, sitting,  Relieving factors: none stated   PRECAUTIONS: None  WEIGHT BEARING RESTRICTIONS: No  FALLS:  Has patient fallen in last 6 months? No  PLOF: Independent  PATIENT GOALS:  Decreased pain back and hips.   NEXT MD VISIT:   OBJECTIVE:   DIAGNOSTIC FINDINGS:   PATIENT SURVEYS:    COGNITION: Overall cognitive status: Within functional limits for tasks assessed     SENSATION: Altered due to MS,   POSTURE: No Significant postural limitations   PALPATION:  Pain in L mid thoracic with palpation, and radiating pain into L ribs,  Tenderness in R gr troch and glute.   LUMBAR ROM:  Lumbar ROM: WFL Hips: WFL  LOWER EXTREMITY MMT:    MMT Right eval Left eval  Hip flexion 4- 4-  Hip extension 4- 4-  Hip abduction 4- 4-  Hip adduction    Hip internal rotation    Hip external rotation    Knee flexion 5 5  Knee extension 5 5  Ankle dorsiflexion    Ankle plantarflexion    Ankle inversion    Ankle eversion     (Blank rows = not tested)  LUMBAR SPECIAL TESTS:  Neg SLR   FUNCTIONAL  TESTS:    TODAY'S TREATMENT:                                                                                                                              DATE:   02/27/22: Therapeutic Exercise: Aerobic: Supine:  TA contraction x 15 with breathing; cuing for achieving optimal  contraction;  Bridging x 15;  SLR x 10 bil with TA;  Supine march with TA  Seated: pelvic tilts x 10;  Standing:  Stretches:  LTR x 10  Neuromuscular Re-education: Manual Therapy: DTM/STM to Bil QL, and L thoracic paraspinals, PA mobs for Mid t-spine Modalities:     02/26/22: Therapeutic Exercise: Aerobic: Supine:  Breathing , practice for belly breathing vs chest rise x 10;   Flexion/AAROM for lat stretch x 10;  Shoulder protraction x 10;  TA contraction x 15 with breathing; cuing for achieving optimal contraction;  Bridging x 10;  Seated: Standing: Stretches:;  Seated QL stretch 30 sec x 3 on L;   LTR x 10  Neuromuscular Re-education: Manual Therapy: Long leg distraction on R for hip and lumbar;  Modalities:  Moist heat pack to low back during supine exercises.    02/19/22: Therapeutic Exercise: Aerobic: Supine:  Breathing , practice for belly breathing vs chest rise x 10;   Flexion/AAROM for lat stretch x 10;  TA contraction x 15 with breathing; cuing for achieving optimal contraction;  SLR 2 x 5 with TA bil;  S/L clams 2 x 10;  Seated: Standing: Stretches:;  Seated QL stretch 30 sec x 3 on L;  Neuromuscular Re-education: Manual Therapy: Long leg distraction on R for hip and lumbar; STM/roller to R glute, ITB   02/13/22:  Therapeutic Exercise: Aerobic: Supine: Seated: Standing: Stretches: Childs pose trial (pt dizzy); Seated childs pose L, R, center, Seated pelvic tilts x 15;  Seated QL stretch 30 sec x 3 on L;  Neuromuscular Re-education: Manual Therapy: Therapeutic Activity: Self Care:   PATIENT EDUCATION:  Education details: reviewed and updated HEP Person educated: Patient Education method: Explanation, Demonstration, Tactile cues, Verbal cues, and Handouts Education comprehension: verbalized understanding, returned demonstration, verbal cues required, tactile cues required, and needs further education  HOME EXERCISE PROGRAM: Access Code:  [email or web address]   ASSESSMENT:  CLINICAL IMPRESSION:  02/27/2022 Pt with less pain today from yesterday. Discussed pain management with movement, stretching and heat as needed to decrease pain. Pt with improved ability for TA contraction today without pain, and improved ability for light ther ex. Plan to continue manual as needed, and strengthening as tolerated. PT to benefit from continued education on postural awareness.   Eval: Patient presents with primary complaint of increased pain in thoracic and lumbar region. She has pain in mid T-spine on L, with radiating pain into ribs/torso. She also has pain in R hip. She has mild hypermobility, and instability of hips and core. She has lack of effective HEP for diagnosis of back pain and MS.  Pt with decreased ability for full functional activities due to pain , and will benefit from skilled PT to improve.    OBJECTIVE IMPAIRMENTS: decreased activity tolerance, decreased mobility, decreased strength, increased muscle spasms, improper body mechanics, and pain.   ACTIVITY LIMITATIONS: carrying, lifting, bending, sitting, standing, squatting, stairs, and locomotion level  PARTICIPATION LIMITATIONS: cleaning, shopping, community activity, and yard work  PERSONAL FACTORS:  none  are also affecting patient's functional outcome.   REHAB POTENTIAL: Good  CLINICAL DECISION MAKING: Stable/uncomplicated  EVALUATION COMPLEXITY: Low   GOALS: Goals reviewed with patient? Yes  SHORT TERM GOALS: Target date: 02/27/22  Pt to be independent with initial HEP  Goal status: INITIAL  2.  Pt to demo ability to self correct posture in seated position at least 50 % of the time.   Goal status: INITIAL   LONG TERM GOALS: Target date: 04/10/22  Pt to be independent with final HEP  Goal status: INITIAL  2.  Pt to report decreased pain in back and R hip to 0-3/10 with standing activity and IADLs.   Goal status: INITIAL  3.  Pt to demo improved strength and  stability of hips and core to be [facility] for pt age.  Goal status: INITIAL    PLAN:  PT FREQUENCY: 1-2x/week  PT DURATION: 8 weeks  PLANNED INTERVENTIONS: Therapeutic exercises, Therapeutic activity, Neuromuscular re-education, Balance training, Gait training, Patient/Family education, Self Care, Joint mobilization, Joint manipulation, Stair training, Aquatic Therapy, Dry Needling, Electrical stimulation, Spinal manipulation, Spinal mobilization, Cryotherapy, Moist heat, Taping, Traction, Ultrasound, Ionotophoresis 45m/ml Dexamethasone, and Manual therapy.  PLAN FOR NEXT SESSION:    LLyndee Hensen PT, DPT 8:47 AM  02/27/22

## 2022-03-06 ENCOUNTER — Encounter: Payer: 59 | Admitting: Physical Therapy

## 2022-03-08 ENCOUNTER — Encounter: Payer: Self-pay | Admitting: Physical Therapy

## 2022-03-08 ENCOUNTER — Ambulatory Visit (INDEPENDENT_AMBULATORY_CARE_PROVIDER_SITE_OTHER): Payer: 59 | Admitting: Physical Therapy

## 2022-03-08 ENCOUNTER — Encounter: Payer: Self-pay | Admitting: Radiology

## 2022-03-08 DIAGNOSIS — M5459 Other low back pain: Secondary | ICD-10-CM

## 2022-03-08 DIAGNOSIS — M6281 Muscle weakness (generalized): Secondary | ICD-10-CM | POA: Diagnosis not present

## 2022-03-08 DIAGNOSIS — M546 Pain in thoracic spine: Secondary | ICD-10-CM

## 2022-03-08 DIAGNOSIS — M25551 Pain in right hip: Secondary | ICD-10-CM | POA: Diagnosis not present

## 2022-03-08 NOTE — Therapy (Signed)
OUTPATIENT PHYSICAL THERAPY THORACOLUMBAR TREATMENT   Patient Name: Charlene [name] MRN: [id number] DOB:[birth year], 42 y.o., female Today's Date: 03/08/2022   END OF SESSION:  PT End of Session - 03/08/22 1104     Visit Number 5    Number of Visits 16    Date for PT Re-Evaluation 04/10/22    Authorization Type UHC    PT Start Time 1104    PT Stop Time 1139    PT Time Calculation (min) 35 min    Activity Tolerance Patient tolerated treatment well    Behavior During Therapy WFL for tasks assessed/performed             Past Medical History:  Diagnosis Date   Alcoholism ([place])    Anxiety    Anxiety and depression    Bipolar 2 disorder ([place])    Borderline personality disorder ([place])    Chest pain    Chronic fatigue syndrome    Claustrophobia    on meds   Constipation    Depression    on meds   Elevated blood sugar 06/08/2014   Elevated LFTs    Fatigue    Fatty liver 10/2014   GERD (gastroesophageal reflux disease)    diet related   Heart murmur    Hyperlipidemia    on meds   Infertility associated with anovulation    Joint pain    Low grade squamous intraepithelial lesion (LGSIL) on Pap smear 10/30/2011   Major depressive disorder    Migraines    MS (multiple sclerosis) ([place])    Neuromuscular disorder ([place])    Obesity    Obesity in pregnancy, antepartum    PCOS (polycystic ovarian syndrome)    Personal history of pre-term labor    Prediabetes    Pregnancy induced hypertension    pregnancy related- hx of   Psoriasis    Sleep apnea    SOB (shortness of breath)    Vitamin D deficiency    Past Surgical History:  Procedure Laterality Date   COLONOSCOPY  10/17/2020   DILATION AND CURETTAGE OF UTERUS  08/15/2008   DILATION AND EVACUATION  12/29/2011   Procedure: DILATATION AND EVACUATION;  Surgeon: Mora Bellman, MD;  Location: [facility];  Service: Gynecology;;  Dr, Glo Herring transferred case to Dr. Elly Modena   LAPAROSCOPIC GASTRIC SLEEVE RESECTION N/A  12/07/2014   Procedure: LAPAROSCOPIC GASTRIC SLEEVE RESECTION;  Surgeon: Alphonsa Overall, MD;  Location: [facility];  Service: General;  Laterality: N/A;   Patient Active Problem List   Diagnosis Date Noted   Status post laparoscopic sleeve gastrectomy (2016) 02/15/2022   Iron deficiency anemia 02/15/2022   Notalgia 01/25/2022   Stress 01/25/2022   Insulin resistance 11/29/2021   Elevated LFTs 11/08/2021   Pre-hypertension 10/17/2021   Vitamin D deficiency 10/17/2021   SOBOE (shortness of breath on exertion) 10/17/2021   Other hyperlipidemia 08/09/2021   Rosacea 09/15/2020   Physical exam 06/16/2020   Alcohol abuse 05/01/2018   Bipolar disorder ([place]) 06/08/2016   Borderline personality disorder ([place]) 06/08/2016   Obesity (BMI 30-39.9) 12/07/2014   Hyperlipemia 06/08/2014   Elevated blood sugar 06/08/2014   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   Migraines 06/08/2014   Multiple sclerosis ([place])    History of abnormal cervical Pap smear 10/30/2011   Adjustment disorder with anxiety 01/05/2008   Morbid obesity ([place]) 11/26/2007    PCP: Annye Asa  REFERRING PROVIDER: Lynne Leader  REFERRING DIAG: L Thoracic back pain, R hip pain  Rationale for Evaluation and Treatment: Rehabilitation  THERAPY DIAG:  Other low back pain  Pain in thoracic spine  Muscle weakness (generalized)  Pain in right hip  ONSET DATE:   SUBJECTIVE:                                                                                                                                                                                           SUBJECTIVE STATEMENT:  03/08/2022 Pt states back not too bad today, some days it is very painful. Minimal change overall. Needs to leave a few min early today. Did get a heating pad. Going to see chiropractor this week.   Eval: Pt states pain for Several months, no injury to report. She has Increased pain in back -increased with standing/activity around the house.  Most pain in L  mid and low back, R lateral hip/thigh- if sitting too long  States pain getting worse in last couple months, pain is becoming more constant.  PMH:  May be getting MRI in near future. For T and or L spine as routine f/u.  MS: has had tingling in feet before. Had 17 years: more cognitive brain fog, word finding, every 6 moths, gets infusions, Does not need AD, has intermittent numbness/tingling, fatigue.  Going to healthy weight and wellness. Does have equipment at home,  [employer].   PERTINENT HISTORY: MS,  chronic fatigue  PAIN:  Are you having pain? Yes: NPRS scale: up to 9/10 Pain location: L back an R hip  Pain description: Sore, tight, needs to pop Aggravating factors: standing, sitting,  Relieving factors: none stated   PRECAUTIONS: None  WEIGHT BEARING RESTRICTIONS: No  FALLS:  Has patient fallen in last 6 months? No  PLOF: Independent  PATIENT GOALS:  Decreased pain back and hips.   NEXT MD VISIT:   OBJECTIVE:   DIAGNOSTIC FINDINGS:   PATIENT SURVEYS:    COGNITION: Overall cognitive status: Within functional limits for tasks assessed     SENSATION: Altered due to MS,   POSTURE: No Significant postural limitations   PALPATION:  Pain in L mid thoracic with palpation, and radiating pain into L ribs,  Tenderness in R gr troch and glute.   LUMBAR ROM:  Lumbar ROM: WFL Hips: WFL  LOWER EXTREMITY MMT:    MMT Right eval Left eval  Hip flexion 4- 4-  Hip extension 4- 4-  Hip abduction 4- 4-  Hip adduction    Hip internal rotation    Hip external rotation    Knee flexion 5 5  Knee extension 5 5  Ankle dorsiflexion    Ankle plantarflexion    Ankle inversion  Ankle eversion     (Blank rows = not tested)  LUMBAR SPECIAL TESTS:  Neg SLR   FUNCTIONAL TESTS:    TODAY'S TREATMENT:                                                                                                                              DATE:   03/08/22: Therapeutic  Exercise: Aerobic:  Supine:  TA contraction x15;  x 15 with breathing;  cuing for achieving optimal contraction;  Bridging 2 x 10;    SLR x 10 bil with TA;    Seated:   Standing:  Standing at wall, TA contraction x 10;  Stretches:   Neuromuscular Re-education: Manual Therapy:  Modalities:      02/27/22: Therapeutic Exercise: Aerobic: Supine:  TA contraction x 15 with breathing; cuing for achieving optimal contraction;  Bridging x 15;  SLR x 10 bil with TA;  Supine march with TA  Seated: pelvic tilts x 10;  Standing:  Stretches:  LTR x 10  Neuromuscular Re-education: Manual Therapy: DTM/STM to Bil QL, and L thoracic paraspinals, PA mobs for Mid t-spine Modalities:     02/26/22: Therapeutic Exercise: Aerobic: Supine:  Breathing , practice for belly breathing vs chest rise x 10;   Flexion/AAROM for lat stretch x 10;  Shoulder protraction x 10;  TA contraction x 15 with breathing; cuing for achieving optimal contraction;  Bridging x 10;  Seated: Standing: Stretches:;  Seated QL stretch 30 sec x 3 on L;   LTR x 10  Neuromuscular Re-education: Manual Therapy: Long leg distraction on R for hip and lumbar;  Modalities:  Moist heat pack to low back during supine exercises.    PATIENT EDUCATION:  Education details: reviewed and updated HEP Person educated: Patient Education method: Explanation, Demonstration, Tactile cues, Verbal cues, and Handouts Education comprehension: verbalized understanding, returned demonstration, verbal cues required, tactile cues required, and needs further education  HOME EXERCISE PROGRAM: Access Code: [email or web address]   ASSESSMENT:  CLINICAL IMPRESSION:  03/08/2022 Pt with minimal pain during session. Majority of time spent on achieving active TA contraction, pt with much improved ability for this today, in supine. Plan to continue education on standing position at next visit.   Eval: Patient presents with primary complaint of increased pain in thoracic  and lumbar region. She has pain in mid T-spine on L, with radiating pain into ribs/torso. She also has pain in R hip. She has mild hypermobility, and instability of hips and core. She has lack of effective HEP for diagnosis of back pain and MS. Pt with decreased ability for full functional activities due to pain , and will benefit from skilled PT to improve.    OBJECTIVE IMPAIRMENTS: decreased activity tolerance, decreased mobility, decreased strength, increased muscle spasms, improper body mechanics, and pain.   ACTIVITY LIMITATIONS: carrying, lifting, bending, sitting, standing, squatting, stairs, and locomotion level  PARTICIPATION LIMITATIONS: cleaning, shopping, community activity, and yard work  PERSONAL FACTORS:  none  are also affecting patient's functional outcome.   REHAB POTENTIAL: Good  CLINICAL DECISION MAKING: Stable/uncomplicated  EVALUATION COMPLEXITY: Low   GOALS: Goals reviewed with patient? Yes  SHORT TERM GOALS: Target date: 02/27/22  Pt to be independent with initial HEP  Goal status: INITIAL  2.  Pt to demo ability to self correct posture in seated position at least 50 % of the time.   Goal status: INITIAL   LONG TERM GOALS: Target date: 04/10/22  Pt to be independent with final HEP  Goal status: INITIAL  2.  Pt to report decreased pain in back and R hip to 0-3/10 with standing activity and IADLs.   Goal status: INITIAL  3.  Pt to demo improved strength and stability of hips and core to be [facility] for pt age.  Goal status: INITIAL    PLAN:  PT FREQUENCY: 1-2x/week  PT DURATION: 8 weeks  PLANNED INTERVENTIONS: Therapeutic exercises, Therapeutic activity, Neuromuscular re-education, Balance training, Gait training, Patient/Family education, Self Care, Joint mobilization, Joint manipulation, Stair training, Aquatic Therapy, Dry Needling, Electrical stimulation, Spinal manipulation, Spinal mobilization, Cryotherapy, Moist heat, Taping, Traction,  Ultrasound, Ionotophoresis 79m/ml Dexamethasone, and Manual therapy.  PLAN FOR NEXT SESSION:    LLyndee Hensen PT, DPT 12:30 PM  03/08/22

## 2022-03-13 ENCOUNTER — Encounter: Payer: Self-pay | Admitting: Physical Therapy

## 2022-03-13 ENCOUNTER — Ambulatory Visit (INDEPENDENT_AMBULATORY_CARE_PROVIDER_SITE_OTHER): Payer: 59 | Admitting: Physical Therapy

## 2022-03-13 DIAGNOSIS — M6281 Muscle weakness (generalized): Secondary | ICD-10-CM | POA: Diagnosis not present

## 2022-03-13 DIAGNOSIS — M5459 Other low back pain: Secondary | ICD-10-CM | POA: Diagnosis not present

## 2022-03-13 DIAGNOSIS — M546 Pain in thoracic spine: Secondary | ICD-10-CM

## 2022-03-13 NOTE — Therapy (Addendum)
OUTPATIENT PHYSICAL THERAPY THORACOLUMBAR TREATMENT   Patient Name: Charlene [name] MRN: [id number] DOB:1980/09/05, 42 y.o., female Today's Date: 03/13/2022   END OF SESSION:  PT End of Session - 03/13/22 1302     Visit Number 6    Number of Visits 16    Date for PT Re-Evaluation 04/10/22    Authorization Type UHC    PT Start Time 1305    PT Stop Time 1345    PT Time Calculation (min) 40 min    Activity Tolerance Patient tolerated treatment well    Behavior During Therapy WFL for tasks assessed/performed             Past Medical History:  Diagnosis Date   Alcoholism ([facility])    Anxiety    Anxiety and depression    Bipolar 2 disorder ([facility])    Borderline personality disorder ([facility])    Chest pain    Chronic fatigue syndrome    Claustrophobia    on meds   Constipation    Depression    on meds   Elevated blood sugar 06/08/2014   Elevated LFTs    Fatigue    Fatty liver 10/2014   GERD (gastroesophageal reflux disease)    diet related   Heart murmur    Hyperlipidemia    on meds   Infertility associated with anovulation    Joint pain    Low grade squamous intraepithelial lesion (LGSIL) on Pap smear 10/30/2011   Major depressive disorder    Migraines    MS (multiple sclerosis) ([facility])    Neuromuscular disorder ([facility])    Obesity    Obesity in pregnancy, antepartum    PCOS (polycystic ovarian syndrome)    Personal history of pre-term labor    Prediabetes    Pregnancy induced hypertension    pregnancy related- hx of   Psoriasis    Sleep apnea    SOB (shortness of breath)    Vitamin D deficiency    Past Surgical History:  Procedure Laterality Date   COLONOSCOPY  10/17/2020   DILATION AND CURETTAGE OF UTERUS  08/15/2008   DILATION AND EVACUATION  12/29/2011   Procedure: DILATATION AND EVACUATION;  Surgeon: Catalina Antigua, MD;  Location: [facility];  Service: Gynecology;;  Dr, Emelda Fear transferred case to Dr. Jolayne Panther   LAPAROSCOPIC GASTRIC SLEEVE RESECTION N/A  12/07/2014   Procedure: LAPAROSCOPIC GASTRIC SLEEVE RESECTION;  Surgeon: Ovidio Kin, MD;  Location: [facility];  Service: General;  Laterality: N/A;   Patient Active Problem List   Diagnosis Date Noted   Status post laparoscopic sleeve gastrectomy (2016) 02/15/2022   Iron deficiency anemia 02/15/2022   Notalgia 01/25/2022   Stress 01/25/2022   Insulin resistance 11/29/2021   Elevated LFTs 11/08/2021   Pre-hypertension 10/17/2021   Vitamin D deficiency 10/17/2021   SOBOE (shortness of breath on exertion) 10/17/2021   Other hyperlipidemia 08/09/2021   Rosacea 09/15/2020   Physical exam 06/16/2020   Alcohol abuse 05/01/2018   Bipolar disorder ([facility]) 06/08/2016   Borderline personality disorder ([facility]) 06/08/2016   Obesity (BMI 30-39.9) 12/07/2014   Hyperlipemia 06/08/2014   Elevated blood sugar 06/08/2014   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   Migraines 06/08/2014   Multiple sclerosis ([facility])    History of abnormal cervical Pap smear 10/30/2011   Adjustment disorder with anxiety 01/05/2008   Morbid obesity ([facility]) 11/26/2007    PCP: Neena Rhymes  REFERRING PROVIDER: Clementeen Graham  REFERRING DIAG: L Thoracic back pain, R hip pain  Rationale for Evaluation and Treatment: Rehabilitation  THERAPY DIAG:  Other low back pain  Pain in thoracic spine  Muscle weakness (generalized)  ONSET DATE:   SUBJECTIVE:                                                                                                                                                                                           SUBJECTIVE STATEMENT:  03/13/2022 Pt states back pain variable. She did go to chiropractor this am, did not get adjusted but will go back later this week again for that.   Eval: Pt states pain for Several months, no injury to report. She has Increased pain in back -increased with standing/activity around the house.  Most pain in L mid and low back, R lateral hip/thigh- if sitting too long  States pain  getting worse in last couple months, pain is becoming more constant.  PMH:  May be getting MRI in near future. For T and or L spine as routine f/u.  MS: has had tingling in feet before. Had 17 years: more cognitive brain fog, word finding, every 6 moths, gets infusions, Does not need AD, has intermittent numbness/tingling, fatigue.  Going to healthy weight and wellness. Does have equipment at home,  [employer].   PERTINENT HISTORY: MS,  chronic fatigue  PAIN:  Are you having pain? Yes: NPRS scale: up to 9/10 Pain location: L back an R hip  Pain description: Sore, tight, needs to pop Aggravating factors: standing, sitting,  Relieving factors: none stated   PRECAUTIONS: None  WEIGHT BEARING RESTRICTIONS: No  FALLS:  Has patient fallen in last 6 months? No  PLOF: Independent  PATIENT GOALS:  Decreased pain back and hips.   NEXT MD VISIT:   OBJECTIVE:   DIAGNOSTIC FINDINGS:   PATIENT SURVEYS:    COGNITION: Overall cognitive status: Within functional limits for tasks assessed     SENSATION: Altered due to MS,   POSTURE: No Significant postural limitations   PALPATION:  Pain in L mid thoracic with palpation, and radiating pain into L ribs,  Tenderness in R gr troch and glute.   LUMBAR ROM:  Lumbar ROM: WFL Hips: WFL  LOWER EXTREMITY MMT:    MMT Right eval Left eval  Hip flexion 4- 4-  Hip extension 4- 4-  Hip abduction 4- 4-  Hip adduction    Hip internal rotation    Hip external rotation    Knee flexion 5 5  Knee extension 5 5  Ankle dorsiflexion    Ankle plantarflexion    Ankle inversion    Ankle eversion     (Blank rows = not tested)  LUMBAR SPECIAL  TESTS:  Neg SLR   FUNCTIONAL TESTS:    TODAY'S TREATMENT:                                                                                                                              DATE:   03/13/22: Therapeutic Exercise: Aerobic:  Supine:  TA contraction x15;  x 15 with breathing;   cuing for achieving optimal contraction;  Bridging 2 x 10;    SLR 2 x 10 bil with TA and breathing;    Seated:  Pelvic tilts x 10;  Standing:   Stretches:   Neuromuscular Re-education: Manual Therapy:  Modalities:  03/08/22: Therapeutic Exercise: Aerobic:  Supine:  TA contraction x15;  x 15 with breathing;  cuing for achieving optimal contraction;  Bridging 2 x 10;    SLR x 10 bil with TA;    Seated:   Standing:  Standing at wall, TA contraction x 10;  Stretches:   Neuromuscular Re-education: Manual Therapy:  Modalities:      02/27/22: Therapeutic Exercise: Aerobic: Supine:  TA contraction x 15 with breathing; cuing for achieving optimal contraction;  Bridging x 15;  SLR x 10 bil with TA;  Supine march with TA  Seated: pelvic tilts x 10;  Standing:  Stretches:  LTR x 10  Neuromuscular Re-education: Manual Therapy: DTM/STM to Bil QL, and L thoracic paraspinals, PA mobs for Mid t-spine Modalities:     02/26/22: Therapeutic Exercise: Aerobic: Supine:  Breathing , practice for belly breathing vs chest rise x 10;   Flexion/AAROM for lat stretch x 10;  Shoulder protraction x 10;  TA contraction x 15 with breathing; cuing for achieving optimal contraction;  Bridging x 10;  Seated: Standing: Stretches:;  Seated QL stretch 30 sec x 3 on L;   LTR x 10  Neuromuscular Re-education: Manual Therapy: Long leg distraction on R for hip and lumbar;  Modalities:  Moist heat pack to low back during supine exercises.    PATIENT EDUCATION:  Education details: reviewed and updated HEP Person educated: Patient Education method: Explanation, Demonstration, Tactile cues, Verbal cues, and Handouts Education comprehension: verbalized understanding, returned demonstration, verbal cues required, tactile cues required, and needs further education  HOME EXERCISE PROGRAM: Access Code: [email or web address]   ASSESSMENT:  CLINICAL IMPRESSION:  03/13/2022 Pt with improved ability for TA contraction in  supine. Good ability to achieve contraction and improved breathing with this. She requires max cuing and encouragement to know that she is performing correctly. She has good ability for bridging and has performed several times before, but pt with mild nausea after performing today. Pt with vitals WNL, BP: 124/82, pulse 92, and O2 100%. She was able to sit and rest for about 5 min, and reports being symptoms free prior to leaving clinic. She states this happens at times if she gets over heated. Pt to benefit from continued education and practice with core stabilization at future sessions.   Eval: Patient presents with  primary complaint of increased pain in thoracic and lumbar region. She has pain in mid T-spine on L, with radiating pain into ribs/torso. She also has pain in R hip. She has mild hypermobility, and instability of hips and core. She has lack of effective HEP for diagnosis of back pain and MS. Pt with decreased ability for full functional activities due to pain , and will benefit from skilled PT to improve.    OBJECTIVE IMPAIRMENTS: decreased activity tolerance, decreased mobility, decreased strength, increased muscle spasms, improper body mechanics, and pain.   ACTIVITY LIMITATIONS: carrying, lifting, bending, sitting, standing, squatting, stairs, and locomotion level  PARTICIPATION LIMITATIONS: cleaning, shopping, community activity, and yard work  PERSONAL FACTORS:  none  are also affecting patient's functional outcome.   REHAB POTENTIAL: Good  CLINICAL DECISION MAKING: Stable/uncomplicated  EVALUATION COMPLEXITY: Low   GOALS: Goals reviewed with patient? Yes  SHORT TERM GOALS: Target date: 02/27/22  Pt to be independent with initial HEP  Goal status: INITIAL  2.  Pt to demo ability to self correct posture in seated position at least 50 % of the time.   Goal status: INITIAL   LONG TERM GOALS: Target date: 04/10/22  Pt to be independent with final HEP  Goal status:  INITIAL  2.  Pt to report decreased pain in back and R hip to 0-3/10 with standing activity and IADLs.   Goal status: INITIAL  3.  Pt to demo improved strength and stability of hips and core to be [facility] for pt age.  Goal status: INITIAL    PLAN:  PT FREQUENCY: 1-2x/week  PT DURATION: 8 weeks  PLANNED INTERVENTIONS: Therapeutic exercises, Therapeutic activity, Neuromuscular re-education, Balance training, Gait training, Patient/Family education, Self Care, Joint mobilization, Joint manipulation, Stair training, Aquatic Therapy, Dry Needling, Electrical stimulation, Spinal manipulation, Spinal mobilization, Cryotherapy, Moist heat, Taping, Traction, Ultrasound, Ionotophoresis 4mg /ml Dexamethasone, and Manual therapy.  PLAN FOR NEXT SESSION:    Sedalia Muta, PT, DPT 1:49 PM  03/13/22   PHYSICAL THERAPY DISCHARGE SUMMARY  Visits from Start of Care: 6   Plan: Patient agrees to discharge.  Patient goals were not met. Patient is being discharged due to - Pt did not return after last visit.     Sedalia Muta, PT, DPT 1:35 PM  08/16/22

## 2022-03-15 ENCOUNTER — Encounter (INDEPENDENT_AMBULATORY_CARE_PROVIDER_SITE_OTHER): Payer: Self-pay | Admitting: Family Medicine

## 2022-03-15 ENCOUNTER — Encounter: Payer: 59 | Admitting: Physical Therapy

## 2022-03-15 ENCOUNTER — Ambulatory Visit (INDEPENDENT_AMBULATORY_CARE_PROVIDER_SITE_OTHER): Payer: 59 | Admitting: Family Medicine

## 2022-03-15 ENCOUNTER — Other Ambulatory Visit: Payer: Self-pay

## 2022-03-15 VITALS — BP 150/92 | HR 96 | Ht 66.0 in | Wt 219.0 lb

## 2022-03-15 VITALS — BP 118/78 | HR 82 | Temp 98.1°F | Ht 66.0 in | Wt 215.0 lb

## 2022-03-15 DIAGNOSIS — D509 Iron deficiency anemia, unspecified: Secondary | ICD-10-CM

## 2022-03-15 DIAGNOSIS — R7989 Other specified abnormal findings of blood chemistry: Secondary | ICD-10-CM | POA: Diagnosis not present

## 2022-03-15 DIAGNOSIS — Z6834 Body mass index (BMI) 34.0-34.9, adult: Secondary | ICD-10-CM | POA: Insufficient documentation

## 2022-03-15 DIAGNOSIS — Z6832 Body mass index (BMI) 32.0-32.9, adult: Secondary | ICD-10-CM | POA: Insufficient documentation

## 2022-03-15 DIAGNOSIS — M546 Pain in thoracic spine: Secondary | ICD-10-CM | POA: Diagnosis not present

## 2022-03-15 DIAGNOSIS — E669 Obesity, unspecified: Secondary | ICD-10-CM

## 2022-03-15 DIAGNOSIS — E7849 Other hyperlipidemia: Secondary | ICD-10-CM | POA: Diagnosis not present

## 2022-03-15 DIAGNOSIS — Z6837 Body mass index (BMI) 37.0-37.9, adult: Secondary | ICD-10-CM | POA: Insufficient documentation

## 2022-03-15 DIAGNOSIS — G8929 Other chronic pain: Secondary | ICD-10-CM

## 2022-03-15 LAB — CBC WITH DIFFERENTIAL/PLATELET
Basophils Absolute: 0.1 10*3/uL (ref 0.0–0.1)
Basophils Relative: 1 % (ref 0.0–3.0)
Eosinophils Absolute: 0.2 10*3/uL (ref 0.0–0.7)
Eosinophils Relative: 3.8 % (ref 0.0–5.0)
HCT: 36.9 % (ref 36.0–46.0)
Hemoglobin: 11.8 g/dL — ABNORMAL LOW (ref 12.0–15.0)
Lymphocytes Relative: 18.8 % (ref 12.0–46.0)
Lymphs Abs: 0.9 10*3/uL (ref 0.7–4.0)
MCHC: 32.1 g/dL (ref 30.0–36.0)
MCV: 79.8 fl (ref 78.0–100.0)
Monocytes Absolute: 0.5 10*3/uL (ref 0.1–1.0)
Monocytes Relative: 10 % (ref 3.0–12.0)
Neutro Abs: 3.2 10*3/uL (ref 1.4–7.7)
Neutrophils Relative %: 66.4 % (ref 43.0–77.0)
Platelets: 346 10*3/uL (ref 150.0–400.0)
RBC: 4.62 Mil/uL (ref 3.87–5.11)
RDW: 26.9 % — ABNORMAL HIGH (ref 11.5–15.5)
WBC: 4.9 10*3/uL (ref 4.0–10.5)

## 2022-03-15 LAB — COMPREHENSIVE METABOLIC PANEL
ALT: 26 U/L (ref 0–35)
AST: 24 U/L (ref 0–37)
Albumin: 4.3 g/dL (ref 3.5–5.2)
Alkaline Phosphatase: 54 U/L (ref 39–117)
BUN: 8 mg/dL (ref 6–23)
CO2: 28 mEq/L (ref 19–32)
Calcium: 9.5 mg/dL (ref 8.4–10.5)
Chloride: 105 mEq/L (ref 96–112)
Creatinine, Ser: 0.72 mg/dL (ref 0.40–1.20)
GFR: 103.41 mL/min (ref >60.00)
Glucose, Bld: 89 mg/dL (ref 70–99)
Potassium: 3.9 mEq/L (ref 3.5–5.1)
Sodium: 141 mEq/L (ref 135–145)
Total Bilirubin: 0.3 mg/dL (ref 0.2–1.2)
Total Protein: 7 g/dL (ref 6.0–8.3)

## 2022-03-15 MED ORDER — ATORVASTATIN CALCIUM 20 MG PO TABS: 20.0000 mg | ORAL_TABLET | Freq: Every day | ORAL | 0 refills | Status: DC

## 2022-03-15 NOTE — Progress Notes (Signed)
   I, Peterson Lombard, LAT, ATC acting as a scribe for Lynne Leader, MD.  Charlene [name] is a 42 y.o. female who presents to [place] [facility] today for mid and lower back pain. Pt was last seen by Dr. Georgina Snell on 02/09/22; Diagnostic Imaging of of lumbar and thoracic spine were performed, labs drawn to check iron levels, and pt was referred to PT with Lyndee Hensen. Pt has completed 6 visits with PT. Today patient reports not much difference in her pain. Pt is having a hard time w/ HEP, because she "hates them." Pt also had a chiro visit on Tuesday and repeat today, and did get some relief from this treatment.   Additionally at the prior visit she had restless leg syndrome and iron deficiency anemia was discovered with profound low iron stores.  She has been started on iron supplementation and taking iron sulfate 325 mg 2 or 3 times daily. She is interested in lab recheck today (I agree)   Diagnostic Imaging  02/09/22 -  XR L-Spine               - XR T-Spine  Pertinent review of systems: No fevers or chills  Relevant historical information: History of multiple sclerosis.  History of transaminitis.   Exam:  BP (!) 150/92   Pulse 96   Ht 5' 6"$  (1.676 m)   Wt 219 lb (99.3 kg)   SpO2 98%   BMI 35.35 kg/m  General: Well Developed, well nourished, and in no acute distress.   MSK: T-spine nontender midline.  Nontender palpation paraspinal musculature.  Normal thoracic motion.    Lab and Radiology Results  Iron stores dated January 2024 Lab Results  Component Value Date   IRON 15 (L) 02/09/2022   TIBC 457 (H) 02/09/2022   FERRITIN 2 (L) 02/09/2022      Assessment and Plan: 42 y.o. female with chronic thoracic back pain.  Marginal improvement with physical therapy some improvement with chiropractor care.  Talked about conservative management strategies including heating pad and TENS unit potentially dry needling.    Anticipate MRI C-spine and T-spine with  neurology to follow-up her MS March 15.  Would like to see the results of these tests to dictate future treatment options including potential epidural steroid injections or facet injections.  We also talked about restless leg syndrome.  She has done a good month of oral iron repletion.  Will check CBC iron stores to assess quality of repletion.  Will check metabolic panel as well given her history of transaminitis.   PDMP not reviewed this encounter. Orders Placed This Encounter  Procedures   Comprehensive metabolic panel    Standing Status:   Future    Number of Occurrences:   1    Standing Expiration Date:   03/15/2023   No orders of the defined types were placed in this encounter.    Discussed warning signs or symptoms. Please see discharge instructions. Patient expresses understanding.   The above documentation has been reviewed and is accurate and complete Lynne Leader, M.D.

## 2022-03-15 NOTE — Patient Instructions (Addendum)
Please get labs today before you leave   Keep working with PT and [facility] about your back.  Ask about dry needling.   Let me know when you get your MRI results back.   If the iron still is bad I will ask a hematologist to eval for IV iron relation.

## 2022-03-16 LAB — IRON,TIBC AND FERRITIN PANEL
%SAT: 21 % (calc) (ref 16–45)
Ferritin: 16 ng/mL (ref 16–232)
Iron: 83 ug/dL (ref 40–190)
TIBC: 389 mcg/dL (calc) (ref 250–450)

## 2022-03-16 NOTE — Progress Notes (Signed)
Iron stores are improving significantly.  Hemoglobin is low at 11 which is up from 10.4 in July.  Your liver enzymes are normal again.  Please continue oral iron.  These labs should be rechecked again in about 3 months.

## 2022-03-23 ENCOUNTER — Ambulatory Visit: Payer: 59 | Admitting: Family Medicine

## 2022-03-28 ENCOUNTER — Encounter: Payer: Self-pay | Admitting: Neurology

## 2022-03-28 ENCOUNTER — Ambulatory Visit (INDEPENDENT_AMBULATORY_CARE_PROVIDER_SITE_OTHER): Payer: 59 | Admitting: Neurology

## 2022-03-28 VITALS — BP 149/85 | HR 87 | Ht 66.0 in | Wt 218.2 lb

## 2022-03-28 DIAGNOSIS — E559 Vitamin D deficiency, unspecified: Secondary | ICD-10-CM

## 2022-03-28 DIAGNOSIS — L400 Psoriasis vulgaris: Secondary | ICD-10-CM | POA: Diagnosis not present

## 2022-03-28 DIAGNOSIS — R269 Unspecified abnormalities of gait and mobility: Secondary | ICD-10-CM

## 2022-03-28 DIAGNOSIS — F319 Bipolar disorder, unspecified: Secondary | ICD-10-CM

## 2022-03-28 DIAGNOSIS — G35 Multiple sclerosis: Secondary | ICD-10-CM

## 2022-03-28 DIAGNOSIS — Z79899 Other long term (current) drug therapy: Secondary | ICD-10-CM | POA: Diagnosis not present

## 2022-03-28 NOTE — Progress Notes (Signed)
[facility]  PATIENT: Charlene [name] DOB: 1980/06/07  REFERRING DOCTOR OR PCP: Annye Asa, MD (PCP); Ala Bent MD ([facility]) SOURCE: Patient, notes from Dr. Shelia Media, imaging and lab reports, MRI images personally reviewed.  _________________________________   HISTORICAL  CHIEF COMPLAINT:  Chief Complaint  Patient presents with   Follow-up    RM 11,alone. Last seen 09/26/21. Having a lot of back pain (middle/upper), some neck pain. Went to sports medicine, had xrays, completed PT that was ineffective. Sees chiropractor now. This has been going on for a few months. Pain more severe now.  Also has RLS. Dr. Felecia Shelling put her on requip. However, sports medicine found low iron, placed on iron supplements. Never took requip d/t this and having hx bipolar and can cause bipolar mania. Wondering why Dr. Felecia Shelling put her on med w/ this hx/did not check labs    Eye Problem    Has irritated feeling in eyes. Tried eye drops, heated therapy. Seeing ophthalmologist. Sometimes cannot keep eyes open. Thinks it may be interaction of Ocrevus/psoriasis medicine. Recently went off Cosentyx (stopped as of today- was due today).     HISTORY OF PRESENT ILLNESS:  Charlene [name] is a 42 y.o. woman with multiple sclerosis.  Update 03/28/2022: She is on Ocrevus since 2017.   We checked labs and she did not have the VZV IgG so I advised her to get the Shingrix vaccine and she has done so.    She has seen [facility] and has lost 20+ pounds.   She is trying diet.   BP was elevated today 149/85 and has been high a couple times the past years at other office.      Currently, she is doing well neurologically.  Gait is stable with her balance mildly off and she uses the bannister on stairs and cannot use a ladder.Sometimes she feels her leg is going to collapse.    She notes tingling in her wrists, ankles and feet bilaterally.   She also notes mild arm weakness, often worse than  her legs.    She has heat intolerance and poor exercise tolerance.      She has good bladder function.    She may have IBS.  She has been 20/20 with vision Has recent eye exam and was told she had dry eyes.  She gets frequent headaches.     She has biploar disease and also diagnosed with PTSD, borderline personality disease and depression.  She sees Evette Doffing [facility]) a [facility].   She sometimes has inappropriate crying.   Does not have inappropriate laughing.    This was daily while she was working and now occurs a couple times a week.   She feels better since lamotrigine was increased.     She notes mild cognitive issues - following instructions, retaining what she has read, etc.    She is on Cosentyx for psoriasis and was on Taltz in the past.     She was recently diagnose with iron deficient anemia.    RLS is better with iron   MS History  She was diagnosed by Dr. Buford Dresser in 2006 in [facility] after presenting with right optic neuritis.  A few years earlier, she had an MRI showing white matter lesions concerning for MS.  A spinal tap before the ON was non-diagnostic.  She was on Copaxone and then Betaseron.   She then switched to Tysabri around 2007-2011 and then stopped for childbearing. 2012-2016.  She had some fluctuations of symptoms those years but no definite exaceration.   She was placed on Tecfidera around 2016 but stopped due to low WBC.  She has been on Ocrevus since 2017.  On it she has had no exacerbations (last infusion was July 2023).     Her mother had dementia and also cerebellar atrophy.      Imaging: MRI of the brain 05/22/2021 shows multiple T2/FLAIR hyperintense foci in the periventricular, juxtacortical and deep white matter of the hemispheres.  None of the foci enhance or appear to be acute.  MRI of the brain 04/16/2020 shows multiple T2/FLAIR hyperintense foci in the periventricular, juxtacortical and deep white matter of the hemispheres.  None of the  foci enhance or appear to be acute.  No infratentorial foci noted.     REVIEW OF SYSTEMS: Constitutional: No fevers, chills, sweats, or change in appetite.  Other fatigue Eyes: No visual changes, double vision.  No eye pain but has eye irritation Ear, nose and throat: No hearing loss, ear pain, nasal congestion, sore throat Cardiovascular: No chest pain, palpitations Respiratory:  No shortness of breath at rest or with exertion.   No wheezes GastrointestinaI: No nausea, vomiting, diarrhea, abdominal pain, fecal incontinence Genitourinary:  No dysuria, urinary retention or frequency.  No nocturia. Musculoskeletal:  No neck pain, back pain Integumentary: No rash, pruritus, skin lesions Neurological: as above Psychiatric: She has bipolar disease and depression.   Endocrine: No palpitations, diaphoresis, change in appetite, change in weigh or increased thirst Hematologic/Lymphatic:  No anemia, purpura, petechiae. Allergic/Immunologic: No itchy/runny eyes, nasal congestion, recent allergic reactions, rashes  ALLERGIES: Allergies  Allergen Reactions   Ciprofloxacin Other (See Comments)    Other reaction(s): Mental Status Changes  Manic episode   Manic episode Manic episode Other reaction(s): Mental Status Changes  Manic episode   Prednisone Other (See Comments)    Other reaction(s):  Puts her in manic episode. She is Bipolar   Puts her in manic episode. She is Bipolar Puts her in manic episode. She is Bipolar Other reaction(s):  Puts her in manic episode. She is Bipolar   African Mango [Irvingia Gabonensis] Other (See Comments)    HOME MEDICATIONS:  Current Outpatient Medications:    atorvastatin (LIPITOR) 20 MG tablet, Take 1 tablet (20 mg total) by mouth daily., Disp: 90 tablet, Rfl: 0   ibuprofen (ADVIL) 200 MG tablet, Take 200 mg by mouth every 6 (six) hours as needed., Disp: , Rfl:    Iron, Ferrous Sulfate, 325 (65 Fe) MG TABS, Take 325 mg by mouth in the morning, at  noon, and at bedtime., Disp: 30 tablet, Rfl:    lamoTRIgine (LAMICTAL) 150 MG tablet, Take 200 mg by mouth daily., Disp: , Rfl:    lisdexamfetamine (VYVANSE) 50 MG capsule, Take 50 mg by mouth daily., Disp: , Rfl:    Multiple Vitamin (MULTIVITAMIN) tablet, Take 1 tablet by mouth daily., Disp: , Rfl:    naltrexone (DEPADE) 50 MG tablet, Take 50 mg by mouth daily., Disp: , Rfl:    ocrelizumab (OCREVUS) 300 MG/10ML injection, See admin instructions., Disp: , Rfl:    Vitamin D, Ergocalciferol, (DRISDOL) 1.25 MG (50000 UNIT) CAPS capsule, Take 1 capsule (50,000 Units total) by mouth every 7 (seven) days., Disp: 12 capsule, Rfl: 3   zolmitriptan (ZOMIG) 5 MG tablet, Take 1 p.o. as needed for migraine.  May repeat in 2 hours.  No more than 2 pills in a day or 4 pills in any week., Disp:  10 tablet, Rfl: 11  PAST MEDICAL HISTORY: Past Medical History:  Diagnosis Date   Alcoholism ([place])    Anxiety    Anxiety and depression    Bipolar 2 disorder ([place])    Borderline personality disorder ([place])    Chest pain    Chronic fatigue syndrome    Claustrophobia    on meds   Constipation    Depression    on meds   Elevated blood sugar 06/08/2014   Elevated LFTs    Fatigue    Fatty liver 10/2014   GERD (gastroesophageal reflux disease)    diet related   Heart murmur    Hyperlipidemia    on meds   Infertility associated with anovulation    Joint pain    Low grade squamous intraepithelial lesion (LGSIL) on Pap smear 10/30/2011   Major depressive disorder    Migraines    MS (multiple sclerosis) (HCC)    Neuromuscular disorder (HCC)    Obesity    Obesity in pregnancy, antepartum    PCOS (polycystic ovarian syndrome)    Personal history of pre-term labor    Prediabetes    Pregnancy induced hypertension    pregnancy related- hx of   Psoriasis    Sleep apnea    SOB (shortness of breath)    Vitamin D deficiency     PAST SURGICAL HISTORY: Past Surgical History:  Procedure Laterality Date    COLONOSCOPY  10/17/2020   DILATION AND CURETTAGE OF UTERUS  08/15/2008   DILATION AND EVACUATION  12/29/2011   Procedure: DILATATION AND EVACUATION;  Surgeon: Mora Bellman, MD;  Location: [facility];  Service: Gynecology;;  Dr, Glo Herring transferred case to Dr. Elly Modena   LAPAROSCOPIC GASTRIC SLEEVE RESECTION N/A 12/07/2014   Procedure: LAPAROSCOPIC GASTRIC SLEEVE RESECTION;  Surgeon: Alphonsa Overall, MD;  Location: [facility];  Service: General;  Laterality: N/A;    FAMILY HISTORY: Family History  Problem Relation Age of Onset   Hypertension Mother    Hyperlipidemia Mother    Mental illness Mother    Dementia Mother        early onset   Depression Mother    Anxiety disorder Mother    Drug abuse Mother    Eating disorder Mother    Alcoholism Father    Heart disease Father    Alcoholism Maternal Grandmother    Mental illness Maternal Grandmother    Esophageal cancer Maternal Grandmother    Heart attack Maternal Grandfather    Colon cancer Neg Hx    Stomach cancer Neg Hx    Colon polyps Neg Hx    Rectal cancer Neg Hx     SOCIAL HISTORY: Social History   Socioeconomic History   Marital status: Married    Spouse name: Theresia Lo   Number of children: 3   Years of education: 14   Highest education level: Not on file  Occupational History   Occupation: Glass blower/designer   Occupation: stay at home mom, part time daycare  Tobacco Use   Smoking status: Former    Packs/day: 1.00    Years: 15.00    Total pack years: 15.00    Types: Cigarettes    Quit date: 07/02/2010    Years since quitting: 11.7   Smokeless tobacco: Never  Vaping Use   Vaping Use: Never used  Substance and Sexual Activity   Alcohol use: Not Currently    Comment: recovery alcoholic   Drug use: No   Sexual activity: Yes    Partners: Male  Birth control/protection: None    Comment: vasectomy  Other Topics Concern   Not on file  Social History Narrative   Work or School: homemaker      Home Situation: lives with  husband, 2 twins and 1 boy      Spiritual Beliefs: none      Lifestyle: no regular exercise, poor diet   Caffeine use: diet soda daily      Social Determinants of Health   Financial Resource Strain: Not on file  Food Insecurity: Not on file  Transportation Needs: Not on file  Physical Activity: Not on file  Stress: Not on file  Social Connections: Not on file  Intimate Partner Violence: Not on file       PHYSICAL EXAM  Vitals:   03/28/22 1308 03/28/22 1326  BP: (!) 142/85 (!) 149/85  Pulse: 86 87  Weight: 218 lb 3.2 oz (99 kg)   Height: '5\' 6"'$  (1.676 m)     Body mass index is 35.22 kg/m.   General: The patient is well-developed and well-nourished and in no acute distress  HEENT:  Head is [place]/AT.  Sclera are anicteric.    Skin: Extremities are without rash or  edema.  Musculoskeletal:  Back is nontender  Neurologic Exam  Mental status: The patient is alert and oriented x 3 at the time of the examination. The patient has apparent normal recent and remote memory, with an apparently normal attention span and concentration ability.   Speech is normal.  Cranial nerves: Extraocular movements are full. Pupils are equal, round, and reactive to light and accomodation.  Facial strength is normal .   Sensation is normal.   palate. No obvious hearing deficits are noted.  Motor:  Muscle bulk is normal.   Tone is normal. Strength is  5 / 5 in all 4 extremities.   Sensory: Sensory testing is intact to pinprick, soft touch and vibration sensation in all 4 extremities.  Coordination: Cerebellar testing reveals good finger-nose-finger and heel-to-shin bilaterally.  Gait and station: Station is normal.   Gait is normal. Tandem gait is mildly wide. Romberg is negative.   Reflexes: Deep tendon reflexes are symmetric and normal bilaterally.      DIAGNOSTIC DATA (LABS, IMAGING, TESTING) - I reviewed patient records, labs, notes, testing and imaging myself where available.  Lab  Results  Component Value Date   WBC 4.9 03/15/2022   HGB 11.8 (L) 03/15/2022   HCT 36.9 03/15/2022   MCV 79.8 03/15/2022   PLT 346.0 03/15/2022      Component Value Date/Time   NA 141 03/15/2022 1006   NA 138 11/08/2021 1148   K 3.9 03/15/2022 1006   CL 105 03/15/2022 1006   CO2 28 03/15/2022 1006   GLUCOSE 89 03/15/2022 1006   GLUCOSE 93 11/24/2013 1126   BUN 8 03/15/2022 1006   BUN 8 11/08/2021 1148   CREATININE 0.72 03/15/2022 1006   CREATININE 0.43 (L) 12/31/2013 1542   CALCIUM 9.5 03/15/2022 1006   PROT 7.0 03/15/2022 1006   PROT 7.0 11/08/2021 1148   ALBUMIN 4.3 03/15/2022 1006   ALBUMIN 4.6 11/08/2021 1148   AST 24 03/15/2022 1006   ALT 26 03/15/2022 1006   ALKPHOS 54 03/15/2022 1006   BILITOT 0.3 03/15/2022 1006   BILITOT <0.2 11/08/2021 1148   GFRNONAA >60 12/02/2014 1120   GFRAA >60 12/02/2014 1120   Lab Results  Component Value Date   CHOL 144 11/08/2021   HDL 48 11/08/2021   LDLCALC  77 11/08/2021   LDLDIRECT 236.0 12/22/2019   TRIG 106 11/08/2021   CHOLHDL 3 06/17/2020   Lab Results  Component Value Date   HGBA1C 5.1 11/08/2021   Lab Results  Component Value Date   [id number] 11/08/2021   Lab Results  Component Value Date   TSH 1.480 03/29/2021       ASSESSMENT AND PLAN  Multiple sclerosis ([place])  High risk medication use  Vitamin D deficiency  Plaque psoriasis  Bipolar affective disorder, remission status unspecified ([place])   Continue Ocrevus and check labs.  We will check MRI brain and spine to determine if there is any progression.  If occurring consider a different DMT.   Try to lose more weight and continue to exercise Continue iron for anemia and it is helping RLS some.   Rtc 6 months or sooner if new or worsening symptoms  Charlene [name] A. Felecia Shelling, MD, [facility] [id number], [id number] PM Certified in Neurology, Clinical Neurophysiology, Sleep Medicine and Neuroimaging  [facility] [address], [place], [place] [address] [phone]

## 2022-03-29 LAB — CBC WITH DIFFERENTIAL/PLATELET
Basophils Absolute: 0.1 10*3/uL (ref 0.0–0.2)
Basos: 1 %
EOS (ABSOLUTE): 0.2 10*3/uL (ref 0.0–0.4)
Eos: 2 %
Hematocrit: 39.8 % (ref 34.0–46.6)
Hemoglobin: 12.7 g/dL (ref 11.1–15.9)
Immature Grans (Abs): 0 10*3/uL (ref 0.0–0.1)
Immature Granulocytes: 0 %
Lymphocytes Absolute: 1.3 10*3/uL (ref 0.7–3.1)
Lymphs: 19 %
MCH: 27.1 pg (ref 26.6–33.0)
MCHC: 31.9 g/dL (ref 31.5–35.7)
MCV: 85 fL (ref 79–97)
Monocytes Absolute: 0.7 10*3/uL (ref 0.1–0.9)
Monocytes: 10 %
Neutrophils Absolute: 4.6 10*3/uL (ref 1.4–7.0)
Neutrophils: 68 %
Platelets: 328 10*3/uL (ref 150–450)
RBC: 4.69 x10E6/uL (ref 3.77–5.28)
RDW: 23.7 % — ABNORMAL HIGH (ref 11.7–15.4)
WBC: 6.8 10*3/uL (ref 3.4–10.8)

## 2022-03-29 LAB — VITAMIN D 25 HYDROXY (VIT D DEFICIENCY, FRACTURES): Vit D, 25-Hydroxy: 68.7 ng/mL (ref 30.0–100.0)

## 2022-03-29 LAB — IGG, IGA, IGM
IgA/Immunoglobulin A, Serum: 203 mg/dL (ref 87–352)
IgG (Immunoglobin G), Serum: 992 mg/dL (ref 586–1602)
IgM (Immunoglobulin M), Srm: 60 mg/dL (ref 26–217)

## 2022-03-30 ENCOUNTER — Encounter: Payer: Self-pay | Admitting: Neurology

## 2022-04-02 NOTE — Progress Notes (Signed)
Chief Complaint:   OBESITY Charlene [name] is here to discuss her progress with her obesity treatment plan along with follow-up of her obesity related diagnoses. Charlene [name] is on keeping a food journal and adhering to recommended goals of 1800 calories and 120+ grams of protein and states she is following her eating plan approximately 70% of the time. Charlene [name] states she is doing physical therapy.   Today's visit was #: 14 Starting weight: 250 lbs Starting date: 03/29/2021 Today's weight: 215 lbs Today's date: 03/15/2022 Total lbs lost to date: 35 Total lbs lost since last in-office visit: 12  Interim History: Charlene [name] continues to do very well with weight loss losing 12 lbs in the last 6 weeks. She is doing physical therapy for her back and notes her hunger is controlled.   Subjective:   1. Iron deficiency anemia, unspecified iron deficiency anemia type Shakia was recently diagnosed with iron deficiency by Dr. Georgina Snell and she has labs rechecked this morning. She has no results yet. She has increased OTC iron to 2-4 doses per day but she notes increased constipation.   2. Other hyperlipidemia Wave is stable on Lipitor with no side effects noted. She requests a refill today.   Assessment/Plan:   1. Iron deficiency anemia, unspecified iron deficiency anemia type Charlene [name] is ok to take 1/2-1 capful of MiraLAX daily, and will try to increase her water and fiber intake. We will follow and modify her diet as needed.   2. Other hyperlipidemia Torryn will continue Lipitor, and we will refill for 90 days.   - atorvastatin (LIPITOR) 20 MG tablet; Take 1 tablet (20 mg total) by mouth daily.  Dispense: 90 tablet; Refill: 0  3. BMI 34.0-34.9,adult  4. Obesity, Beginning BMI 40.35 Charlene [name] is currently in the action stage of change. As such, her goal is to continue with weight loss efforts. She has agreed to keeping a food journal and adhering to recommended goals of 1800 calories and 120+ grams of protein daily.    Exercise goals: As is.   Behavioral modification strategies: increasing lean protein intake.  Charlene [name] has agreed to follow-up with our clinic in 4 weeks. She was informed of the importance of frequent follow-up visits to maximize her success with intensive lifestyle modifications for her multiple health conditions.   Objective:   Blood pressure 118/78, pulse 82, temperature 98.1 F (36.7 C), height 5\' 6"  (1.676 m), weight 215 lb (97.5 kg), SpO2 99 %. Body mass index is 34.7 kg/m.  Lab Results  Component Value Date   CREATININE 0.72 03/15/2022   BUN 8 03/15/2022   NA 141 03/15/2022   K 3.9 03/15/2022   CL 105 03/15/2022   CO2 28 03/15/2022   Lab Results  Component Value Date   ALT 26 03/15/2022   AST 24 03/15/2022   ALKPHOS 54 03/15/2022   BILITOT 0.3 03/15/2022   Lab Results  Component Value Date   HGBA1C 5.1 11/08/2021   HGBA1C 5.4 03/29/2021   HGBA1C 4.9 05/05/2018   Lab Results  Component Value Date   INSULIN 15.6 11/08/2021   INSULIN 15.6 03/29/2021   Lab Results  Component Value Date   TSH 1.480 03/29/2021   Lab Results  Component Value Date   CHOL 144 11/08/2021   HDL 48 11/08/2021   LDLCALC 77 11/08/2021   LDLDIRECT 236.0 12/22/2019   TRIG 106 11/08/2021   CHOLHDL 3 06/17/2020   Lab Results  Component Value Date   VD25OH 68.7 03/28/2022   VD25OH  68.0 11/08/2021   VD25OH 19.20 (L) 06/17/2020   Lab Results  Component Value Date   WBC 6.8 03/28/2022   HGB 12.7 03/28/2022   HCT 39.8 03/28/2022   MCV 85 03/28/2022   PLT 328 03/28/2022   Lab Results  Component Value Date   IRON 83 03/15/2022   TIBC 389 03/15/2022   FERRITIN 16 03/15/2022   Attestation Statements:   Reviewed by clinician on day of visit: allergies, medications, problem list, medical history, surgical history, family history, social history, and previous encounter notes.  I have personally spent 44 minutes total time today in preparation, patient care, and documentation  for this visit, including the following: review of clinical lab tests; review of medical tests/procedures/services.  I, Trixie Dredge, am acting as transcriptionist for Dennard Nip, MD.  I have reviewed the above documentation for accuracy and completeness, and I agree with the above. -  Dennard Nip, MD

## 2022-04-03 ENCOUNTER — Telehealth: Payer: Self-pay | Admitting: Neurology

## 2022-04-03 NOTE — Telephone Encounter (Signed)
[facility] [place] case [id number] sent to GI [phone]

## 2022-04-04 ENCOUNTER — Encounter: Payer: Self-pay | Admitting: Family Medicine

## 2022-04-04 ENCOUNTER — Other Ambulatory Visit (INDEPENDENT_AMBULATORY_CARE_PROVIDER_SITE_OTHER): Payer: Self-pay | Admitting: Family Medicine

## 2022-04-04 ENCOUNTER — Encounter: Payer: Self-pay | Admitting: Neurology

## 2022-04-04 DIAGNOSIS — E7849 Other hyperlipidemia: Secondary | ICD-10-CM

## 2022-04-12 ENCOUNTER — Ambulatory Visit (INDEPENDENT_AMBULATORY_CARE_PROVIDER_SITE_OTHER): Payer: 59 | Admitting: Family Medicine

## 2022-04-12 ENCOUNTER — Encounter (INDEPENDENT_AMBULATORY_CARE_PROVIDER_SITE_OTHER): Payer: Self-pay | Admitting: Family Medicine

## 2022-04-12 VITALS — BP 112/82 | HR 92 | Temp 98.2°F | Ht 66.0 in | Wt 213.0 lb

## 2022-04-12 DIAGNOSIS — E669 Obesity, unspecified: Secondary | ICD-10-CM | POA: Diagnosis not present

## 2022-04-12 DIAGNOSIS — E559 Vitamin D deficiency, unspecified: Secondary | ICD-10-CM | POA: Diagnosis not present

## 2022-04-12 DIAGNOSIS — D509 Iron deficiency anemia, unspecified: Secondary | ICD-10-CM

## 2022-04-12 DIAGNOSIS — Z6834 Body mass index (BMI) 34.0-34.9, adult: Secondary | ICD-10-CM

## 2022-04-12 MED ORDER — FUSION PLUS PO CAPS: 1.0000 | ORAL_CAPSULE | Freq: Every day | ORAL | 0 refills | Status: DC

## 2022-04-12 NOTE — Progress Notes (Signed)
Chief Complaint:   OBESITY Charlene [name] is here to discuss her progress with her obesity treatment plan along with follow-up of her obesity related diagnoses. Charlene [name] is on keeping a food journal and adhering to recommended goals of 1800 calories and 120+ protein and states she is following her eating plan approximately 85-90% of the time. Charlene [name] states she is some walking.  Today's visit was #: 15 Starting weight: 250 LBS Starting date: 03/29/2021 Today's weight: 213 LBS  Today's date: 04/12/2022 Total lbs lost to date: 27 LBS Total lbs lost since last in-office visit: 2 LBS  Interim History: Patient has been dealing with back pain and previously went to PT and then got an xray and started going to chiropractor for adjustment. Has been trying to been more consistent with journaling. Not always meeting her calorie intake goal and has been more consistent with protein amount. Sometimes she isn't hungry and feels like she has to force feed herself food to get her goals.   Subjective:   1. Iron deficiency anemia, unspecified iron deficiency anemia type Patient is on oral iron 3 times daily but has taken turmeric for a period of time.  Recent iron just over normal range.  Patient is positive for fatigue.  2. Vitamin D deficiency Patient is on prescription vitamin D.  Now at goal, level of 58.7.  Assessment/Plan:   1. Iron deficiency anemia, unspecified iron deficiency anemia type Hold OTC iron.  Start- Iron-FA-B Cmp-C-Biot-Probiotic (FUSION PLUS) CAPS; Take 1 capsule by mouth daily.  Dispense: 90 capsule; Refill: 0  2. Vitamin D deficiency Continue vitamin D, no change in dose.  3. BMI 34.0-34.9,adult  4. Obesity, Beginning BMI 40.35 Charlene [name] is currently in the action stage of change. As such, her goal is to continue with weight loss efforts. She has agreed to keeping a food journal and adhering to recommended goals of 1800 calories and 120+ protein daily..   Exercise goals: All adults  should avoid inactivity. Some physical activity is better than none, and adults who participate in any amount of physical activity gain some health benefits.  Behavioral modification strategies: increasing lean protein intake, meal planning and cooking strategies, keeping healthy foods in the home, and planning for success.  Charlene [name] has agreed to follow-up with our clinic in 4-5 weeks. She was informed of the importance of frequent follow-up visits to maximize her success with intensive lifestyle modifications for her multiple health conditions.   Objective:   Blood pressure 112/82, pulse 92, temperature 98.2 F (36.8 C), height 5\' 6"  (1.676 m), weight 213 lb (96.6 kg), SpO2 97 %. Body mass index is 34.38 kg/m.  General: Cooperative, alert, well developed, in no acute distress. HEENT: Conjunctivae and lids unremarkable. Cardiovascular: Regular rhythm.  Lungs: Normal work of breathing. Neurologic: No focal deficits.   Lab Results  Component Value Date   CREATININE 0.72 03/15/2022   BUN 8 03/15/2022   NA 141 03/15/2022   K 3.9 03/15/2022   CL 105 03/15/2022   CO2 28 03/15/2022   Lab Results  Component Value Date   ALT 26 03/15/2022   AST 24 03/15/2022   ALKPHOS 54 03/15/2022   BILITOT 0.3 03/15/2022   Lab Results  Component Value Date   HGBA1C 5.1 11/08/2021   HGBA1C 5.4 03/29/2021   HGBA1C 4.9 05/05/2018   Lab Results  Component Value Date   INSULIN 15.6 11/08/2021   INSULIN 15.6 03/29/2021   Lab Results  Component Value Date   TSH 1.480  03/29/2021   Lab Results  Component Value Date   CHOL 144 11/08/2021   HDL 48 11/08/2021   LDLCALC 77 11/08/2021   LDLDIRECT 236.0 12/22/2019   TRIG 106 11/08/2021   CHOLHDL 3 06/17/2020   Lab Results  Component Value Date   VD25OH 68.7 03/28/2022   VD25OH 68.0 11/08/2021   VD25OH 19.20 (L) 06/17/2020   Lab Results  Component Value Date   WBC 6.8 03/28/2022   HGB 12.7 03/28/2022   HCT 39.8 03/28/2022   MCV 85  03/28/2022   PLT 328 03/28/2022   Lab Results  Component Value Date   IRON 83 03/15/2022   TIBC 389 03/15/2022   FERRITIN 16 03/15/2022   Attestation Statements:   Reviewed by clinician on day of visit: allergies, medications, problem list, medical history, surgical history, family history, social history, and previous encounter notes.  I, Malcolm Metro, RMA, am acting as transcriptionist for Reuben Likes, MD.  I have reviewed the above documentation for accuracy and completeness, and I agree with the above. - Reuben Likes, MD

## 2022-04-24 ENCOUNTER — Telehealth: Payer: Self-pay | Admitting: Family Medicine

## 2022-04-24 NOTE — Telephone Encounter (Signed)
error 

## 2022-04-25 ENCOUNTER — Ambulatory Visit (INDEPENDENT_AMBULATORY_CARE_PROVIDER_SITE_OTHER): Payer: 59 | Admitting: Family Medicine

## 2022-04-25 ENCOUNTER — Telehealth: Payer: Self-pay | Admitting: Gastroenterology

## 2022-04-25 ENCOUNTER — Encounter: Payer: Self-pay | Admitting: Family Medicine

## 2022-04-25 ENCOUNTER — Encounter: Payer: Self-pay | Admitting: Gastroenterology

## 2022-04-25 VITALS — BP 126/82 | HR 116 | Temp 98.3°F | Resp 16 | Ht 66.0 in | Wt 218.2 lb

## 2022-04-25 DIAGNOSIS — K625 Hemorrhage of anus and rectum: Secondary | ICD-10-CM

## 2022-04-25 LAB — HEPATIC FUNCTION PANEL
ALT: 18 U/L (ref 0–35)
AST: 22 U/L (ref 0–37)
Albumin: 4.5 g/dL (ref 3.5–5.2)
Alkaline Phosphatase: 46 U/L (ref 39–117)
Bilirubin, Direct: 0 mg/dL (ref 0.0–0.3)
Total Bilirubin: 0.3 mg/dL (ref 0.2–1.2)
Total Protein: 6.8 g/dL (ref 6.0–8.3)

## 2022-04-25 LAB — CBC WITH DIFFERENTIAL/PLATELET
Basophils Absolute: 0 10*3/uL (ref 0.0–0.1)
Basophils Relative: 0.6 % (ref 0.0–3.0)
Eosinophils Absolute: 0.2 10*3/uL (ref 0.0–0.7)
Eosinophils Relative: 3.5 % (ref 0.0–5.0)
HCT: 37.9 % (ref 36.0–46.0)
Hemoglobin: 13 g/dL (ref 12.0–15.0)
Lymphocytes Relative: 16.2 % (ref 12.0–46.0)
Lymphs Abs: 1 10*3/uL (ref 0.7–4.0)
MCHC: 34.3 g/dL (ref 30.0–36.0)
MCV: 87.4 fl (ref 78.0–100.0)
Monocytes Absolute: 0.4 10*3/uL (ref 0.1–1.0)
Monocytes Relative: 7.3 % (ref 3.0–12.0)
Neutro Abs: 4.4 10*3/uL (ref 1.4–7.7)
Neutrophils Relative %: 72.4 % (ref 43.0–77.0)
Platelets: 308 10*3/uL (ref 150.0–400.0)
RBC: 4.34 Mil/uL (ref 3.87–5.11)
RDW: 21.5 % — ABNORMAL HIGH (ref 11.5–15.5)
WBC: 6.1 10*3/uL (ref 4.0–10.5)

## 2022-04-25 LAB — BASIC METABOLIC PANEL
BUN: 12 mg/dL (ref 6–23)
CO2: 27 mEq/L (ref 19–32)
Calcium: 9.4 mg/dL (ref 8.4–10.5)
Chloride: 104 mEq/L (ref 96–112)
Creatinine, Ser: 0.73 mg/dL (ref 0.40–1.20)
GFR: 101.64 mL/min (ref >60.00)
Glucose, Bld: 107 mg/dL — ABNORMAL HIGH (ref 70–99)
Potassium: 4.2 mEq/L (ref 3.5–5.1)
Sodium: 138 mEq/L (ref 135–145)

## 2022-04-25 NOTE — Patient Instructions (Addendum)
We'll notify you of your lab results and make any changes if needed I put in for an urgent GI referral to evaluate the bleeding Make sure you are drinking plenty of fluids to help keep up your blood volume Change positions slowly to limit dizziness Call with any questions or concerns Hang in there!!!

## 2022-04-25 NOTE — Telephone Encounter (Signed)
Patient is scheduled as work in on 05/16/22.

## 2022-04-25 NOTE — Progress Notes (Signed)
   Subjective:    Patient ID: Charlene [name], female    DOB: 02-12-1980, 42 y.o.   MRN: [id number]  HPI BRBPR- pt had colonoscopy Oct 2022.  Was told it was 'fine'.  Bleeding never stopped.  'it comes and goes but it happens more often than not'.  Painless.  Has stopped drinking.  Has been dx'd w/ iron deficiency anemia.  Bleeding 'came back w/ a vengeance'.  Has not bled the last 2 days.  Yesterday felt dizzy and weak- did not lose consciousness but 'lost function in my body'.  Pt reports she will bleed if she sits down to pee- does not have to occur w/ BM.  There is a lot of blood, clots included.     Review of Systems For ROS see HPI     Objective:   Physical Exam Vitals reviewed.  Constitutional:      General: She is in acute distress (very tearful).     Appearance: Normal appearance. She is not ill-appearing.  HENT:     Head: Normocephalic and atraumatic.  Cardiovascular:     Rate and Rhythm: Regular rhythm. Tachycardia present.     Heart sounds: Normal heart sounds.  Pulmonary:     Effort: Pulmonary effort is normal. No respiratory distress.     Breath sounds: No wheezing.  Abdominal:     General: There is no distension.     Palpations: Abdomen is soft.     Tenderness: There is no abdominal tenderness. There is no guarding or rebound.  Musculoskeletal:     Right lower leg: No edema.     Left lower leg: No edema.  Skin:    General: Skin is warm and dry.  Neurological:     General: No focal deficit present.     Mental Status: She is alert and oriented to person, place, and time.  Psychiatric:     Comments: Very anxious, tangential thought process           Assessment & Plan:  BRBPR- deteriorated.  Pt has hx of this but now reports bleeding occurs w/o BM's and she is passing clots.  Colonoscopy in 2022 was reportedly normal.  She has stopped drinking but this has not changed her sxs.  Bleeding is painless.  Yesterday had pre-syncopal episode where she felt weak and  dizzy.  States she was not able to use her arms.  Denies LOC.  She is distraught about her situation and sxs and reports this is 'traumatizing' for her children.  Will order lab work to assess hgb, iron level, and possible underlying causes.  Will place stat GI referral for re-evaluation.  Pt expressed understanding and is in agreement w/ plan.

## 2022-04-25 NOTE — Telephone Encounter (Signed)
Charlene [name],  Urgent triage in [facility] for rectal bleeding and anemia from PCP.  Patient is scheduled for 05/16/22 however they are requesting sooner appt.  Please review and advise urgency and scheduling sooner appt.  Thanks [employer]

## 2022-04-25 NOTE — Telephone Encounter (Signed)
Error

## 2022-04-26 LAB — IRON,TIBC AND FERRITIN PANEL
Ferritin: 17 ng/mL (ref 16–232)
TIBC: 354 mcg/dL (calc) (ref 250–450)

## 2022-04-26 LAB — CELIAC DISEASE PANEL
(tTG) Ab, IgA: <1 U/mL
(tTG) Ab, IgG: <1 U/mL
Immunoglobulin A: 183 mg/dL (ref 47–310)

## 2022-04-26 NOTE — Telephone Encounter (Signed)
-----   Message from [name], MD sent at 04/26/2022  7:29 AM EDT ----- Thankfully your blood count does not show any evidence of anemia at this time.  Your electrolytes and liver functions are excellent.  Waiting on the remainder of the labs.

## 2022-04-27 ENCOUNTER — Encounter: Payer: Self-pay | Admitting: Family Medicine

## 2022-04-27 ENCOUNTER — Telehealth: Payer: Self-pay

## 2022-04-27 LAB — IRON,TIBC AND FERRITIN PANEL
%SAT: 12 % (calc) — ABNORMAL LOW (ref 16–45)
Iron: 44 ug/dL (ref 40–190)

## 2022-04-27 LAB — CELIAC DISEASE PANEL
Gliadin IgA: 2.9 U/mL
Gliadin IgG: <1 U/mL

## 2022-04-27 LAB — ANA: Anti Nuclear Antibody (ANA): NEGATIVE

## 2022-04-27 NOTE — Telephone Encounter (Signed)
Pt seen results via my chart  

## 2022-04-27 NOTE — Telephone Encounter (Signed)
-----   Message from [name], MD sent at 04/27/2022  7:29 AM EDT ----- Iron looks good.  No evidence of celiac- also good

## 2022-04-29 ENCOUNTER — Other Ambulatory Visit: Payer: 59

## 2022-05-10 ENCOUNTER — Encounter (INDEPENDENT_AMBULATORY_CARE_PROVIDER_SITE_OTHER): Payer: Self-pay | Admitting: Family Medicine

## 2022-05-10 ENCOUNTER — Other Ambulatory Visit (INDEPENDENT_AMBULATORY_CARE_PROVIDER_SITE_OTHER): Payer: Self-pay | Admitting: Family Medicine

## 2022-05-10 ENCOUNTER — Ambulatory Visit (INDEPENDENT_AMBULATORY_CARE_PROVIDER_SITE_OTHER): Payer: 59 | Admitting: Family Medicine

## 2022-05-10 VITALS — BP 112/79 | HR 80 | Temp 97.6°F | Ht 66.0 in | Wt 212.0 lb

## 2022-05-10 DIAGNOSIS — R632 Polyphagia: Secondary | ICD-10-CM | POA: Diagnosis not present

## 2022-05-10 DIAGNOSIS — E7849 Other hyperlipidemia: Secondary | ICD-10-CM

## 2022-05-10 DIAGNOSIS — E669 Obesity, unspecified: Secondary | ICD-10-CM

## 2022-05-10 DIAGNOSIS — Z6834 Body mass index (BMI) 34.0-34.9, adult: Secondary | ICD-10-CM

## 2022-05-10 MED ORDER — METFORMIN HCL 500 MG PO TABS
500.0000 mg | ORAL_TABLET | Freq: Every day | ORAL | 0 refills | Status: DC
Start: 2022-05-10 — End: 2022-07-03

## 2022-05-10 NOTE — Progress Notes (Signed)
Charlene [name]    [id number]    May 14, 1980  Primary Care Physician:Tabori, Helane Rima, MD  Referring Physician: [name], MD [address] [place],  [place] [address]   Chief complaint:  Rectal bleeding Chief Complaint  Patient presents with   Rectal Bleeding    Patient states she has had the same rectal bleeding for years. Patient states she was seen a couple years ago and had a colonoscopy. Patient states the bleeding never stopped and it is worse. She can have blood clots the rectal bleeding is so heavy. Patient reports she was dx with IDA because of it. Denies rectal pain.   HPI: 42 yr very pleasant female here for new patient visit for evaluation of rectal bleeding and iron deficiency. No family history of GI malignancy or IBD.  I last saw her on 02-23-20. At that time, she was having on and off rectal bleeding for past 42 year and was bleeding almost daily in the summer. Bleeding was improved to about 1-2x a week.   Today, she complains of rectal bleeding that has persisted for several years and has worsened in the past few months. She states that she experiences bright red blood most days of the week. She would notice large amounts of the blood when wiping and in the toilet bowl but not in her under garments. She would also experience blood clots about once a week and states her stool would be darker when having the blood clots. She states that she's unsure if the blood is mixed up with her stool as things are "too messy". She doesn't seem to be able to identify any triggers to the bleeding. She states that she had hemorrhoids ten years ago when she was pregnant with twins. She would also occasionally experience accompanying abdominal cramping.   She states that she has an iron deficiency and takes iron supplements twice a day. She also complains of chronic constipation. She has tried both stool softener and Miralax without any relief of symptoms. She is  currently taking senna but states the iron pills overpower it's effects.   she denies diarrhea, nausea, black stool, vomiting, bloating, unintentional weight loss, reflux, dysphagia.  She reports previously taking cosentyx but has stopped it recently. She denies any family history of celiac disease.   GI Hx: Colonoscopy 10-17-20 - Perianal fungal rash found on perianal exam.  - Non-bleeding external and internal hemorrhoids.  - The examination was otherwise normal.  - No specimens collected.  CT Abdomen Pelvis w contrast 04-23-16 -No acute findings in the abdomen or pelvis. -Normal appendix. -Suspect mild fatty infiltration of the liver. -Prior gastric sleeve.   Current Outpatient Medications:    ferrous sulfate 325 (65 FE) MG EC tablet, Take 325 mg by mouth 3 (three) times daily with meals., Disp: , Rfl:    lamoTRIgine (LAMICTAL) 150 MG tablet, Take 200 mg by mouth daily., Disp: , Rfl:    lisdexamfetamine (VYVANSE) 50 MG capsule, Take 50 mg by mouth daily., Disp: , Rfl:    metFORMIN (GLUCOPHAGE) 500 MG tablet, Take 1 tablet (500 mg total) by mouth daily., Disp: 30 tablet, Rfl: 0   Multiple Vitamin (MULTIVITAMIN) tablet, Take 1 tablet by mouth daily., Disp: , Rfl:    naltrexone (DEPADE) 50 MG tablet, Take 50 mg by mouth daily., Disp: , Rfl:    Vitamin D, Ergocalciferol, (DRISDOL) 1.25 MG (50000 UNIT) CAPS capsule, Take 1 capsule (50,000  Units total) by mouth every 7 (seven) days., Disp: 12 capsule, Rfl: 3   VTAMA 1 % CREA, Apply 1 Application topically daily., Disp: , Rfl:    VYVANSE 60 MG capsule, Take 60 mg by mouth every morning., Disp: , Rfl:    zolmitriptan (ZOMIG) 5 MG tablet, Take 1 p.o. as needed for migraine.  May repeat in 2 hours.  No more than 2 pills in a day or 4 pills in any week., Disp: 10 tablet, Rfl: 11    Allergies as of 05/16/2022 - Review Complete 05/16/2022  Allergen Reaction Noted   Ciprofloxacin Other (See Comments) 02/11/2016   Prednisone Other (See  Comments) 02/11/2016   African mango [irvingia gabonensis] Other (See Comments) 03/29/2021    Past Medical History:  Diagnosis Date   Alcoholism (HCC)    Anxiety    Anxiety and depression    Bipolar 2 disorder (HCC)    Borderline personality disorder (HCC)    Chest pain    Chronic fatigue syndrome    Claustrophobia    on meds   Constipation    Depression    on meds   Elevated blood sugar 06/08/2014   Elevated LFTs    Fatigue    Fatty liver 10/2014   GERD (gastroesophageal reflux disease)    diet related   Heart murmur    Hyperlipidemia    on meds   Infertility associated with anovulation    Joint pain    Low grade squamous intraepithelial lesion (LGSIL) on Pap smear 10/30/2011   Major depressive disorder    Migraines    MS (multiple sclerosis) (HCC)    Neuromuscular disorder (HCC)    Obesity    Obesity in pregnancy, antepartum    PCOS (polycystic ovarian syndrome)    Personal history of pre-term labor    Prediabetes    Pregnancy induced hypertension    pregnancy related- hx of   Psoriasis    Sleep apnea    SOB (shortness of breath)    Vitamin D deficiency     Past Surgical History:  Procedure Laterality Date   COLONOSCOPY  10/17/2020   DILATION AND CURETTAGE OF UTERUS  08/15/2008   DILATION AND EVACUATION  12/29/2011   Procedure: DILATATION AND EVACUATION;  Surgeon: Catalina Antigua, MD;  Location: [facility];  Service: Gynecology;;  Dr, Emelda Fear transferred case to Dr. Jolayne Panther   LAPAROSCOPIC GASTRIC SLEEVE RESECTION N/A 12/07/2014   Procedure: LAPAROSCOPIC GASTRIC SLEEVE RESECTION;  Surgeon: Ovidio Kin, MD;  Location: [facility];  Service: General;  Laterality: N/A;    Family History  Problem Relation Age of Onset   Hypertension Mother    Hyperlipidemia Mother    Mental illness Mother    Dementia Mother        early onset   Depression Mother    Anxiety disorder Mother    Drug abuse Mother    Eating disorder Mother    Alcoholism Father    Heart disease  Father    Alcoholism Maternal Grandmother    Mental illness Maternal Grandmother    Esophageal cancer Maternal Grandmother    Heart attack Maternal Grandfather    Colon cancer Neg Hx    Stomach cancer Neg Hx    Colon polyps Neg Hx    Rectal cancer Neg Hx     Social History   Socioeconomic History   Marital status: Married    Spouse name: Pennie Rushing   Number of children: 3   Years of education: 54  Highest education level: Not on file  Occupational History   Occupation: Print production planner   Occupation: stay at home mom, part time daycare  Tobacco Use   Smoking status: Former    Packs/day: 1.00    Years: 15.00    Additional pack years: 0.00    Total pack years: 15.00    Types: Cigarettes    Quit date: 07/02/2010    Years since quitting: 11.8   Smokeless tobacco: Never  Vaping Use   Vaping Use: Never used  Substance and Sexual Activity   Alcohol use: Not Currently    Comment: recovery alcoholic   Drug use: No   Sexual activity: Yes    Partners: Male    Birth control/protection: None    Comment: vasectomy  Other Topics Concern   Not on file  Social History Narrative   Work or School: homemaker      Home Situation: lives with husband, 2 twins and 1 boy      Spiritual Beliefs: none      Lifestyle: no regular exercise, poor diet   Caffeine use: diet soda daily      Social Determinants of Health   Financial Resource Strain: Not on file  Food Insecurity: Not on file  Transportation Needs: Not on file  Physical Activity: Not on file  Stress: Not on file  Social Connections: Not on file  Intimate Partner Violence: Not on file      Review of systems: Review of Systems  Constitutional:  Negative for unexpected weight change.  HENT:  Negative for trouble swallowing.   Gastrointestinal:  Positive for abdominal pain, anal bleeding, blood in stool and constipation. Negative for abdominal distention, diarrhea, nausea, rectal pain and vomiting.       Physical  Exam: Vitals:   05/16/22 1016  BP: 132/64  Pulse: 74   Body mass index is 34.86 kg/m. General: well-appearing   Eyes: sclera anicteric, no redness ENT: oral mucosa moist without lesions, no cervical or supraclavicular lymphadenopathy CV: RRR, no JVD, no peripheral edema Resp: clear to auscultation bilaterally, normal RR and effort noted GI: soft, no tenderness, with active bowel sounds. No guarding or palpable organomegaly noted. Skin; warm and dry, no rash or jaundice noted Neuro: awake, alert and oriented x 3. Normal gross motor function and fluent speech Rectal exam: internal and external hemorrhoids. Irritated internal hemorrhoid.   Reviewed labs, radiology imaging, old records and pertinent past GI work up   Assessment and Plan/Recommendations:  42 year old very pleasant female for evaluation of rectal bleeding Most likely etiology of rectal bleeding is small-volume hemorrhage from internal hemorrhoids, reviewed recent colonoscopy from October 2022 which showed hemorrhoids otherwise was unremarkable exam Advised patient to use Anusol suppository per rectum daily at bedtime for 7 days Increase dietary fiber and water intake Avoid excessive straining during defecation  Iron deficiency: Will schedule EGD to exclude any upper GI source of chronic blood loss Patient is extremely anxious and would like to have iron level and CBC rechecked.  Will obtain it Continue iron supplements as tolerated  Chronic idiopathic constipation exacerbated by iron Use daily stool softener Patient was provided samples for Linzess 145 mcg daily, advised her to call if she is having improvement of her bowel habits, will titrate the dose based on response  The risks and benefits as well as alternatives of endoscopic procedure(s) have been discussed and reviewed. All questions answered. The patient agrees to proceed.   This visit required >40 minutes of patient care (this  includes precharting, chart  review, review of results, face-to-face time used for counseling as well as treatment plan and follow-up. The patient was provided an opportunity to ask questions and all were answered. The patient agreed with the plan and demonstrated an understanding of the instructions.    Iona Beard , MD    CC: [name], MD   Ladona Mow Hewitt [employer]orts as a scribe for Marsa Aris, MD.,have documented all relevant documentation on the behalf of Marsa Aris, MD,as directed by  Marsa Aris, MD while in the presence of Marsa Aris, MD.   I, Marsa Aris, MD, have reviewed all documentation for this visit. The documentation on 05/16/22 for the exam, diagnosis, procedures, and orders are all accurate and complete.

## 2022-05-15 NOTE — Progress Notes (Signed)
Chief Complaint:   OBESITY Charlene [name] is here to discuss her progress with her obesity treatment plan along with follow-up of her obesity related diagnoses. Charlene [name] is on keeping a food journal and adhering to recommended goals of 1800 calories and 120+ grams of protein and states she is following her eating plan approximately 85% of the time. Charlene [name] states she is doing 0 minutes 0 times per week.  Today's visit was #: 16 Starting weight: 250 lbs Starting date: 03/29/2021 Today's weight: 212 lbs Today's date: 05/10/2022 Total lbs lost to date: 38 Total lbs lost since last in-office visit: 1  Interim History: Charlene [name] continues to work on her diet and weight loss.  She notes increased sugar cravings which she has given into more in the last month.  Subjective:   1. Polyphagia Charlene [name] notes increased hunger and sugar cravings.  She wonders what options she has to decrease her cravings.  2. Other hyperlipidemia Charlene [name] was on a low dose statin and she had very good results.  She noticed significant myalgia and stopped her statin, and she notes improvement in pain almost immediately.  Assessment/Plan:   1. Charlene [name] agreed to start metformin at 500 mg once daily with no refills, and she will work on decreasing simple carbohydrates.  - metFORMIN (GLUCOPHAGE) 500 MG tablet; Take 1 tablet (500 mg total) by mouth daily.  Dispense: 30 tablet; Refill: 0  2. Other hyperlipidemia Charlene [name] agreed to discontinue statin and she is to continue decreasing cholesterol in her diet.  She is to discuss treatment options with Dr. Beverely Low and start OTC co-Q10 in the meantime.  3. BMI 34.0-34.9,adult  4. Obesity, Beginning BMI 40.35 Charlene [name] is currently in the action stage of change. As such, her goal is to continue with weight loss efforts. She has agreed to keeping a food journal and adhering to recommended goals of 1800 calories and 120+ grams of protein daily.   Behavioral modification strategies:  decreasing simple carbohydrates and better snacking choices.  Charlene [name] has agreed to follow-up with our clinic in 4 weeks. She was informed of the importance of frequent follow-up visits to maximize her success with intensive lifestyle modifications for her multiple health conditions.   Objective:   Blood pressure 112/79, pulse 80, temperature 97.6 F (36.4 C), height 5\' 6"  (1.676 m), weight 212 lb (96.2 kg), SpO2 100 %. Body mass index is 34.22 kg/m.  Lab Results  Component Value Date   CREATININE 0.73 04/25/2022   BUN 12 04/25/2022   NA 138 04/25/2022   K 4.2 04/25/2022   CL 104 04/25/2022   CO2 27 04/25/2022   Lab Results  Component Value Date   ALT 18 04/25/2022   AST 22 04/25/2022   ALKPHOS 46 04/25/2022   BILITOT 0.3 04/25/2022   Lab Results  Component Value Date   HGBA1C 5.1 11/08/2021   HGBA1C 5.4 03/29/2021   HGBA1C 4.9 05/05/2018   Lab Results  Component Value Date   INSULIN 15.6 11/08/2021   INSULIN 15.6 03/29/2021   Lab Results  Component Value Date   TSH 1.480 03/29/2021   Lab Results  Component Value Date   CHOL 144 11/08/2021   HDL 48 11/08/2021   LDLCALC 77 11/08/2021   LDLDIRECT 236.0 12/22/2019   TRIG 106 11/08/2021   CHOLHDL 3 06/17/2020   Lab Results  Component Value Date   VD25OH 68.7 03/28/2022   VD25OH 68.0 11/08/2021   VD25OH 19.20 (L) 06/17/2020   Lab Results  Component Value  Date   WBC 6.1 04/25/2022   HGB 13.0 04/25/2022   HCT 37.9 04/25/2022   MCV 87.4 04/25/2022   PLT 308.0 04/25/2022   Lab Results  Component Value Date   IRON 44 04/25/2022   TIBC 354 04/25/2022   FERRITIN 17 04/25/2022   Attestation Statements:   Reviewed by clinician on day of visit: allergies, medications, problem list, medical history, surgical history, family history, social history, and previous encounter notes.   I, Burt Knack, am acting as transcriptionist for Quillian Quince, MD.  I have reviewed the above documentation for accuracy and  completeness, and I agree with the above. -  Quillian Quince, MD

## 2022-05-16 ENCOUNTER — Ambulatory Visit (INDEPENDENT_AMBULATORY_CARE_PROVIDER_SITE_OTHER): Payer: 59 | Admitting: Gastroenterology

## 2022-05-16 ENCOUNTER — Other Ambulatory Visit (INDEPENDENT_AMBULATORY_CARE_PROVIDER_SITE_OTHER): Payer: 59

## 2022-05-16 ENCOUNTER — Encounter: Payer: Self-pay | Admitting: Gastroenterology

## 2022-05-16 VITALS — BP 132/64 | HR 74 | Ht 66.0 in | Wt 216.0 lb

## 2022-05-16 DIAGNOSIS — K5904 Chronic idiopathic constipation: Secondary | ICD-10-CM | POA: Diagnosis not present

## 2022-05-16 DIAGNOSIS — D508 Other iron deficiency anemias: Secondary | ICD-10-CM | POA: Diagnosis not present

## 2022-05-16 DIAGNOSIS — K641 Second degree hemorrhoids: Secondary | ICD-10-CM

## 2022-05-16 LAB — IBC + FERRITIN
Ferritin: 10.6 ng/mL (ref 10.0–291.0)
Iron: 200 ug/dL — ABNORMAL HIGH (ref 42–145)
Saturation Ratios: 56.9 % — ABNORMAL HIGH (ref 20.0–50.0)
TIBC: 351.4 ug/dL (ref 250.0–450.0)
Transferrin: 251 mg/dL (ref 212.0–360.0)

## 2022-05-16 LAB — HEMATOCRIT: HCT: 41.1 % (ref 36.0–46.0)

## 2022-05-16 LAB — HEMOGLOBIN: Hemoglobin: 13.9 g/dL (ref 12.0–15.0)

## 2022-05-16 MED ORDER — LINACLOTIDE 145 MCG PO CAPS: 145.0000 ug | ORAL_CAPSULE | Freq: Every day | ORAL | 3 refills | Status: DC

## 2022-05-16 MED ORDER — HYDROCORTISONE ACETATE 25 MG RE SUPP: 25.0000 mg | Freq: Every evening | RECTAL | 1 refills | Status: DC | PRN

## 2022-05-16 NOTE — Patient Instructions (Addendum)
You have been scheduled for an endoscopy. Please follow written instructions given to you at your visit today. If you use inhalers (even only as needed), please bring them with you on the day of your procedure.     Your provider has requested that you go to the basement level for lab work before leaving today. Press "B" on the elevator. The lab is located at the first door on the left as you exit the elevator.   We have sent the following medications to your pharmacy for you to pick up at your convenience:  Linzess  Hydrocortisone suppositories   If you are still bleeding rectally we will repeat your colonoscopy  We will schedule you for a hemorrhoidal banding on 6/19  Due to recent changes in healthcare laws, you may see the results of your imaging and laboratory studies on MyChart before your provider has had a chance to review them.  We understand that in some cases there may be results that are confusing or concerning to you. Not all laboratory results come back in the same time frame and the provider may be waiting for multiple results in order to interpret others.  Please give [place] 48 hours in order for your provider to thoroughly review all the results before contacting the office for clarification of your results.    _______________________________________________________  If your blood pressure at your visit was 140/90 or greater, please contact your primary care physician to follow up on this.  _______________________________________________________  If you are age 53 or older, your body mass index should be between 23-30. Your Body mass index is 34.86 kg/m. If this is out of the aforementioned range listed, please consider follow up with your Primary Care Provider.  If you are age 90 or younger, your body mass index should be between 19-25. Your Body mass index is 34.86 kg/m. If this is out of the aformentioned range listed, please consider follow up with your Primary Care  Provider.   ________________________________________________________  The [facility] providers would like to encourage you to use [facility] to communicate with providers for non-urgent requests or questions.  Due to long hold times on the telephone, sending your provider a message by [facility] may be a faster and more efficient way to get a response.  Please allow 48 business hours for a response.  Please remember that this is for non-urgent requests.  _______________________________________________________   I appreciate the  opportunity to care for you  Thank You   Marsa Aris , MD

## 2022-05-17 ENCOUNTER — Telehealth: Payer: Self-pay | Admitting: Pharmacy Technician

## 2022-05-17 NOTE — Telephone Encounter (Signed)
Patient Advocate Encounter  Received notification from [employer] that prior authorization for [facility] is required.   PA submitted on 5.2.24 Key [id number] Status is pending

## 2022-05-18 ENCOUNTER — Telehealth: Payer: Self-pay | Admitting: Gastroenterology

## 2022-05-21 ENCOUNTER — Encounter: Payer: Self-pay | Admitting: Neurology

## 2022-05-22 ENCOUNTER — Encounter: Payer: Self-pay | Admitting: Gastroenterology

## 2022-05-22 ENCOUNTER — Ambulatory Visit (AMBULATORY_SURGERY_CENTER): Payer: 59 | Admitting: Gastroenterology

## 2022-05-22 ENCOUNTER — Other Ambulatory Visit: Payer: Self-pay | Admitting: *Deleted

## 2022-05-22 VITALS — BP 130/89 | HR 86 | Temp 98.4°F | Resp 17 | Ht 66.0 in | Wt 216.0 lb

## 2022-05-22 DIAGNOSIS — K209 Esophagitis, unspecified without bleeding: Secondary | ICD-10-CM | POA: Diagnosis not present

## 2022-05-22 DIAGNOSIS — K2100 Gastro-esophageal reflux disease with esophagitis, without bleeding: Secondary | ICD-10-CM

## 2022-05-22 DIAGNOSIS — D509 Iron deficiency anemia, unspecified: Secondary | ICD-10-CM

## 2022-05-22 DIAGNOSIS — K2950 Unspecified chronic gastritis without bleeding: Secondary | ICD-10-CM | POA: Diagnosis not present

## 2022-05-22 MED ORDER — ALPRAZOLAM 1 MG PO TABS: ORAL_TABLET | ORAL | 0 refills | Status: DC

## 2022-05-22 MED ORDER — SODIUM CHLORIDE 0.9 % IV SOLN
500.0000 mL | Freq: Once | INTRAVENOUS | Status: DC
Start: 2022-05-22 — End: 2022-05-22

## 2022-05-22 MED ORDER — OMEPRAZOLE 40 MG PO CPDR: 40.0000 mg | DELAYED_RELEASE_CAPSULE | Freq: Every day | ORAL | 3 refills | Status: DC

## 2022-05-22 NOTE — Progress Notes (Signed)
Pt's states no medical or surgical changes since previsit or office visit. 

## 2022-05-22 NOTE — Patient Instructions (Addendum)
-   Resume previous diet. - Continue present medications. - Await pathology results. - Use Prilosec (omeprazole) 40 mg PO daily, Rx 90 days with 3 refills - Follow an antireflux regimen.   YOU HAD AN ENDOSCOPIC PROCEDURE TODAY AT THE [facility]:   Refer to the procedure report that was given to you for any specific questions about what was found during the examination.  If the procedure report does not answer your questions, please call your gastroenterologist to clarify.  If you requested that your care partner not be given the details of your procedure findings, then the procedure report has been included in a sealed envelope for you to review at your convenience later.  YOU SHOULD EXPECT: Some feelings of bloating in the abdomen. Passage of more gas than usual.  Walking can help get rid of the air that was put into your GI tract during the procedure and reduce the bloating. If you had a lower endoscopy (such as a colonoscopy or flexible sigmoidoscopy) you may notice spotting of blood in your stool or on the toilet paper. If you underwent a bowel prep for your procedure, you may not have a normal bowel movement for a few days.  Please Note:  You might notice some irritation and congestion in your nose or some drainage.  This is from the oxygen used during your procedure.  There is no need for concern and it should clear up in a day or so.  SYMPTOMS TO REPORT IMMEDIATELY:  Following upper endoscopy (EGD)  Vomiting of blood or coffee ground material  New chest pain or pain under the shoulder blades  Painful or persistently difficult swallowing  New shortness of breath  Fever of 100F or higher  Black, tarry-looking stools  For urgent or emergent issues, a gastroenterologist can be reached at any hour by calling [phone]. Do not use MyChart messaging for urgent concerns.    DIET:  We do recommend a small meal at first, but then you may proceed to your regular diet.  Drink  plenty of fluids but you should avoid alcoholic beverages for 24 hours.  ACTIVITY:  You should plan to take it easy for the rest of today and you should NOT DRIVE or use heavy machinery until tomorrow (because of the sedation medicines used during the test).    FOLLOW UP: Our staff will call the number listed on your records the next business day following your procedure.  We will call around 7:15- 8:00 am to check on you and address any questions or concerns that you may have regarding the information given to you following your procedure. If we do not reach you, we will leave a message.     If any biopsies were taken you will be contacted by phone or by letter within the next 1-3 weeks.  Please call us at [phone] if you have not heard about the biopsies in 3 weeks.    SIGNATURES/CONFIDENTIALITY: You and/or your care partner have signed paperwork which will be entered into your electronic medical record.  These signatures attest to the fact that that the information above on your After Visit Summary has been reviewed and is understood.  Full responsibility of the confidentiality of this discharge information lies with you and/or your care-partner.

## 2022-05-22 NOTE — Op Note (Signed)
[facility] Patient Name: Charlene [name] Procedure Date: 05/22/2022 1:41 PM MRN: [id number] Endoscopist: Napoleon Form , MD, [id number] Age: 42 Referring MD:  Date of Birth: 02/02/1980 Gender: Female Account #: [id number] Procedure:                Upper GI endoscopy Indications:              Iron deficiency anemia due to suspected upper                            gastrointestinal bleeding, Suspected upper                            gastrointestinal bleeding in patient with                            unexplained iron deficiency anemia Medicines:                Monitored Anesthesia Care Procedure:                After obtaining informed consent, the endoscope was                            passed under direct vision. Throughout the                            procedure, the patient's blood pressure, pulse, and                            oxygen saturations were monitored continuously. The                            GIF [id number] #[id number] was introduced through the                            mouth, and advanced to the second part of duodenum.                            The upper GI endoscopy was accomplished without                            difficulty. The patient tolerated the procedure                            well. Scope In: Scope Out: Findings:                 LA Grade C (one or more mucosal breaks continuous                            between tops of 2 or more mucosal folds, less than                            75% circumference) esophagitis with no bleeding was  found 34 to 36 cm from the incisors. Biopsies were                            taken with a cold forceps for histology.                           A 2 cm hiatal hernia was present.                           Patchy mild inflammation characterized by                            congestion (edema), erythema and friability was                            found in the entire examined stomach.  Biopsies were                            taken with a cold forceps for Helicobacter pylori                            testing.                           The cardia and gastric fundus were normal on                            retroflexion.                           The first portion of the duodenum and second                            portion of the duodenum were normal. Biopsies for                            histology were taken with a cold forceps for                            evaluation of celiac disease. Complications:            No immediate complications. Estimated Blood Loss:     Estimated blood loss was minimal. Impression:               - LA Grade C erosive esophagitis with no bleeding.                            Biopsied.                           - 2 cm hiatal hernia.                           - Gastritis. Biopsied.                           -  Normal first portion of the duodenum and second                            portion of the duodenum. Biopsied. Recommendation:           - Resume previous diet.                           - Continue present medications.                           - Await pathology results.                           - Use Prilosec (omeprazole) 40 mg PO daily, Rx 90                            days with 3 refills                           - Follow an antireflux regimen. Napoleon Form, MD 05/22/2022 2:16:09 PM This report has been signed electronically.

## 2022-05-22 NOTE — Progress Notes (Signed)
Report to PACU, RN, vss, BBS= Clear.  

## 2022-05-22 NOTE — Progress Notes (Signed)
[facility] History and Physical   Primary Care Physician:  [name], MD   Reason for Procedure:  Iron def anemia  Plan:    EGD  with possible interventions as needed     HPI: Charlene [name] is a very pleasant 42 y.o. female here for EGD for iron deficiency anemia.   The risks and benefits as well as alternatives of endoscopic procedure(s) have been discussed and reviewed. All questions answered. The patient agrees to proceed.    Past Medical History:  Diagnosis Date   Alcoholism (HCC)    Anxiety    Anxiety and depression    Bipolar 2 disorder (HCC)    Borderline personality disorder (HCC)    Chest pain    Chronic fatigue syndrome    Claustrophobia    on meds   Constipation    Depression    on meds   Elevated blood sugar 06/08/2014   Elevated LFTs    Fatigue    Fatty liver 10/2014   GERD (gastroesophageal reflux disease)    diet related   Heart murmur    Hyperlipidemia    on meds   Infertility associated with anovulation    Joint pain    Low grade squamous intraepithelial lesion (LGSIL) on Pap smear 10/30/2011   Major depressive disorder    Migraines    MS (multiple sclerosis) (HCC)    Neuromuscular disorder (HCC)    Obesity    Obesity in pregnancy, antepartum    PCOS (polycystic ovarian syndrome)    Personal history of pre-term labor    Prediabetes    Pregnancy induced hypertension    pregnancy related- hx of   Psoriasis    Sleep apnea    SOB (shortness of breath)    Vitamin D deficiency     Past Surgical History:  Procedure Laterality Date   COLONOSCOPY  10/17/2020   DILATION AND CURETTAGE OF UTERUS  08/15/2008   DILATION AND EVACUATION  12/29/2011   Procedure: DILATATION AND EVACUATION;  Surgeon: Catalina Antigua, MD;  Location: [facility];  Service: Gynecology;;  Dr, Emelda Fear transferred case to Dr. Jolayne Panther   LAPAROSCOPIC GASTRIC SLEEVE RESECTION N/A 12/07/2014   Procedure: LAPAROSCOPIC GASTRIC SLEEVE RESECTION;  Surgeon:  Ovidio Kin, MD;  Location: [facility];  Service: General;  Laterality: N/A;    Prior to Admission medications   Medication Sig Start Date End Date Taking? Authorizing Provider  ALPRAZolam Prudy Feeler) 1 MG tablet Take 2 pills about 30 min prior to the MRI. Can take additional tablet at the time of test if needed.Can cause drowsiness. Must have driver to and from the test 05/22/22  Yes Sater, Pearletha Furl, MD  ferrous sulfate 325 (65 FE) MG EC tablet Take 325 mg by mouth 3 (three) times daily with meals.   Yes [provider]  lamoTRIgine (LAMICTAL) 150 MG tablet Take 200 mg by mouth daily. 10/25/21  Yes [provider]  linaclotide Karlene Einstein) 145 MCG CAPS capsule Take 1 capsule (145 mcg total) by mouth daily before breakfast. 05/16/22  Yes Lakeita Panther, Eleonore Chiquito, MD  Multiple Vitamin (MULTIVITAMIN) tablet Take 1 tablet by mouth daily.   Yes [provider]  naltrexone (DEPADE) 50 MG tablet Take 50 mg by mouth daily. 03/23/20  Yes [provider]  Vitamin D, Ergocalciferol, (DRISDOL) 1.25 MG (50000 UNIT) CAPS capsule Take 1 capsule (50,000 Units total) by mouth every 7 (seven) days. 01/03/22  Yes Beasley, Caren D, MD  VTAMA 1 % CREA Apply 1 Application topically  daily. 05/03/22  Yes [provider]  VYVANSE 60 MG capsule Take 60 mg by mouth every morning. 05/02/22  Yes [provider]  zolmitriptan (ZOMIG) 5 MG tablet Take 1 p.o. as needed for migraine.  May repeat in 2 hours.  No more than 2 pills in a day or 4 pills in any week. 12/06/21  Yes Sater, Pearletha Furl, MD  hydrocortisone (ANUSOL-HC) 25 MG suppository Place 1 suppository (25 mg total) rectally at bedtime as needed for hemorrhoids or anal itching (for 10 days). Patient not taking: Reported on 05/22/2022 05/16/22   Napoleon Form, MD  metFORMIN (GLUCOPHAGE) 500 MG tablet Take 1 tablet (500 mg total) by mouth daily. Patient not taking: Reported on 05/22/2022 05/10/22 06/09/22  Quillian Quince D, MD    Current  Outpatient Medications  Medication Sig Dispense Refill   ALPRAZolam Prudy Feeler) 1 MG tablet Take 2 pills about 30 min prior to the MRI. Can take additional tablet at the time of test if needed.Can cause drowsiness. Must have driver to and from the test 3 tablet 0   ferrous sulfate 325 (65 FE) MG EC tablet Take 325 mg by mouth 3 (three) times daily with meals.     lamoTRIgine (LAMICTAL) 150 MG tablet Take 200 mg by mouth daily.     linaclotide (LINZESS) 145 MCG CAPS capsule Take 1 capsule (145 mcg total) by mouth daily before breakfast. 30 capsule 3   Multiple Vitamin (MULTIVITAMIN) tablet Take 1 tablet by mouth daily.     naltrexone (DEPADE) 50 MG tablet Take 50 mg by mouth daily.     Vitamin D, Ergocalciferol, (DRISDOL) 1.25 MG (50000 UNIT) CAPS capsule Take 1 capsule (50,000 Units total) by mouth every 7 (seven) days. 12 capsule 3   VTAMA 1 % CREA Apply 1 Application topically daily.     VYVANSE 60 MG capsule Take 60 mg by mouth every morning.     zolmitriptan (ZOMIG) 5 MG tablet Take 1 p.o. as needed for migraine.  May repeat in 2 hours.  No more than 2 pills in a day or 4 pills in any week. 10 tablet 11   hydrocortisone (ANUSOL-HC) 25 MG suppository Place 1 suppository (25 mg total) rectally at bedtime as needed for hemorrhoids or anal itching (for 10 days). (Patient not taking: Reported on 05/22/2022) 12 suppository 1   metFORMIN (GLUCOPHAGE) 500 MG tablet Take 1 tablet (500 mg total) by mouth daily. (Patient not taking: Reported on 05/22/2022) 30 tablet 0   Current Facility-Administered Medications  Medication Dose Route Frequency Provider Last Rate Last Admin   0.9 %  sodium chloride infusion  500 mL Intravenous Once Napoleon Form, MD        Allergies as of 05/22/2022 - Review Complete 05/22/2022  Allergen Reaction Noted   Ciprofloxacin Other (See Comments) 02/11/2016   Prednisone Other (See Comments) 02/11/2016   African mango [irvingia gabonensis] Other (See Comments) 03/29/2021     Family History  Problem Relation Age of Onset   Hypertension Mother    Hyperlipidemia Mother    Mental illness Mother    Dementia Mother        early onset   Depression Mother    Anxiety disorder Mother    Drug abuse Mother    Eating disorder Mother    Alcoholism Father    Heart disease Father    Alcoholism Maternal Grandmother    Mental illness Maternal Grandmother    Esophageal cancer Maternal Grandmother    Heart  attack Maternal Grandfather    Colon cancer Neg Hx    Stomach cancer Neg Hx    Colon polyps Neg Hx    Rectal cancer Neg Hx     Social History   Socioeconomic History   Marital status: Married    Spouse name: Pennie Rushing   Number of children: 3   Years of education: 14   Highest education level: Not on file  Occupational History   Occupation: Print production planner   Occupation: stay at home mom, part time daycare  Tobacco Use   Smoking status: Former    Packs/day: 1.00    Years: 15.00    Additional pack years: 0.00    Total pack years: 15.00    Types: Cigarettes    Quit date: 07/02/2010    Years since quitting: 11.8   Smokeless tobacco: Never  Vaping Use   Vaping Use: Never used  Substance and Sexual Activity   Alcohol use: Not Currently    Comment: recovery alcoholic   Drug use: No   Sexual activity: Yes    Partners: Male    Birth control/protection: None    Comment: vasectomy  Other Topics Concern   Not on file  Social History Narrative   Work or School: homemaker      Home Situation: lives with husband, 2 twins and 1 boy      Spiritual Beliefs: none      Lifestyle: no regular exercise, poor diet   Caffeine use: diet soda daily      Social Determinants of Health   Financial Resource Strain: Not on file  Food Insecurity: Not on file  Transportation Needs: Not on file  Physical Activity: Not on file  Stress: Not on file  Social Connections: Not on file  Intimate Partner Violence: Not on file    Review of Systems:  All other review of  systems negative except as mentioned in the HPI.  Physical Exam: Vital signs in last 24 hours: Blood Pressure 124/85   Pulse 93   Temperature 98.4 F (36.9 C)   Respiration 12   Height 5\' 6"  (1.676 m)   Weight 216 lb (98 kg)   Last Menstrual Period 05/08/2022 (Approximate) Comment: no chance of pregnancy per pt  Oxygen Saturation 100%   Body Mass Index 34.86 kg/m  General:   Alert, NAD Lungs:  Clear .   Heart:  Regular rate and rhythm Abdomen:  Soft, nontender and nondistended. Neuro/Psych:  Alert and cooperative. Normal mood and affect. A and O x 3  Reviewed labs, radiology imaging, old records and pertinent past GI work up  Patient is appropriate for planned procedure(s) and anesthesia in an ambulatory setting   K. Scherry Ran , MD [phone]

## 2022-05-22 NOTE — Progress Notes (Signed)
Called to room to assist during endoscopic procedure.  Patient ID and intended procedure confirmed with present staff. Received instructions for my participation in the procedure from the performing physician.  

## 2022-05-23 ENCOUNTER — Encounter: Payer: Self-pay | Admitting: Gastroenterology

## 2022-05-23 ENCOUNTER — Telehealth: Payer: Self-pay | Admitting: *Deleted

## 2022-05-23 NOTE — Telephone Encounter (Signed)
  Follow up Call-     05/22/2022   12:58 PM 10/17/2020   10:28 AM  Call back number  Post procedure Call Back phone  # [phone]   Permission to leave phone message Yes Yes     Patient questions:  Do you have a fever, pain , or abdominal swelling? No. Pain Score  0 *  Have you tolerated food without any problems? Yes.    Have you been able to return to your normal activities? Yes.    Do you have any questions about your discharge instructions: Diet   No. Medications  No. Follow up visit  No.  Do you have questions or concerns about your Care? No.  Actions: * If pain score is 4 or above: No action needed, pain <4.

## 2022-05-25 ENCOUNTER — Encounter: Payer: Self-pay | Admitting: Gastroenterology

## 2022-05-26 ENCOUNTER — Ambulatory Visit
Admission: RE | Admit: 2022-05-26 | Discharge: 2022-05-26 | Disposition: A | Discharge status: Home / Self Care | Payer: 59 | Source: Ambulatory Visit | Attending: Neurology | Admitting: Neurology

## 2022-05-26 DIAGNOSIS — R269 Unspecified abnormalities of gait and mobility: Secondary | ICD-10-CM

## 2022-05-26 DIAGNOSIS — G35 Multiple sclerosis: Secondary | ICD-10-CM

## 2022-05-26 DIAGNOSIS — G35D Multiple sclerosis, unspecified: Secondary | ICD-10-CM

## 2022-05-30 ENCOUNTER — Encounter: Payer: Self-pay | Admitting: Family Medicine

## 2022-06-05 NOTE — Progress Notes (Unsigned)
   Rubin Payor, PhD, LAT, ATC acting as a scribe for Clementeen Graham, MD.  Charlene [name] is a 42 y.o. female who presents to [facility] today for f/u thoracic back pain w/ MRI review and RLS. Pt was last seen by Dr. Denyse Amass on 03/15/22 and labs were obtained   Pertinent review of systems: ***  Relevant historical information: ***   Exam:  LMP 05/08/2022 (Approximate) Comment: no chance of pregnancy per pt General: Well Developed, well nourished, and in no acute distress.   MSK: ***    Lab and Radiology Results No results found for this or any previous visit (from the past 72 hour(s)). No results found.     Assessment and Plan: 42 y.o. female with ***   PDMP not reviewed this encounter. No orders of the defined types were placed in this encounter.  No orders of the defined types were placed in this encounter.    Discussed warning signs or symptoms. Please see discharge instructions. Patient expresses understanding.   ***

## 2022-06-06 ENCOUNTER — Encounter: Payer: Self-pay | Admitting: Family Medicine

## 2022-06-06 ENCOUNTER — Ambulatory Visit (INDEPENDENT_AMBULATORY_CARE_PROVIDER_SITE_OTHER): Payer: 59 | Admitting: Family Medicine

## 2022-06-06 VITALS — BP 120/84 | HR 105 | Ht 66.0 in | Wt 219.0 lb

## 2022-06-06 DIAGNOSIS — G2581 Restless legs syndrome: Secondary | ICD-10-CM

## 2022-06-06 DIAGNOSIS — G8929 Other chronic pain: Secondary | ICD-10-CM

## 2022-06-06 DIAGNOSIS — M546 Pain in thoracic spine: Secondary | ICD-10-CM

## 2022-06-06 NOTE — Patient Instructions (Addendum)
Thank you for coming in today.   I recommend waiting about 2 months between iron check labs.   I seems like you are intolerant to statins.  OK to try pravastatin as it is metabolized differently.   Let me know how it goes.   I am happy to get labs and refer to Hematology when you want to.

## 2022-06-07 ENCOUNTER — Other Ambulatory Visit (INDEPENDENT_AMBULATORY_CARE_PROVIDER_SITE_OTHER): Payer: Self-pay | Admitting: Family Medicine

## 2022-06-07 ENCOUNTER — Encounter (INDEPENDENT_AMBULATORY_CARE_PROVIDER_SITE_OTHER): Payer: Self-pay | Admitting: Family Medicine

## 2022-06-07 ENCOUNTER — Ambulatory Visit (INDEPENDENT_AMBULATORY_CARE_PROVIDER_SITE_OTHER): Payer: 59 | Admitting: Family Medicine

## 2022-06-07 VITALS — BP 133/84 | HR 93 | Temp 98.1°F | Ht 66.0 in | Wt 215.0 lb

## 2022-06-07 DIAGNOSIS — R632 Polyphagia: Secondary | ICD-10-CM

## 2022-06-07 DIAGNOSIS — G43809 Other migraine, not intractable, without status migrainosus: Secondary | ICD-10-CM

## 2022-06-07 DIAGNOSIS — E669 Obesity, unspecified: Secondary | ICD-10-CM

## 2022-06-07 DIAGNOSIS — E88819 Insulin resistance, unspecified: Secondary | ICD-10-CM | POA: Diagnosis not present

## 2022-06-07 DIAGNOSIS — Z6834 Body mass index (BMI) 34.0-34.9, adult: Secondary | ICD-10-CM | POA: Diagnosis not present

## 2022-06-07 DIAGNOSIS — Z789 Other specified health status: Secondary | ICD-10-CM

## 2022-06-12 NOTE — Progress Notes (Unsigned)
Chief Complaint:   OBESITY Charlene [name] is here to discuss her progress with her obesity treatment plan along with follow-up of her obesity related diagnoses. Charlene [name] is on keeping a food journal and adhering to recommended goals of 1800 calories and 120+ grams of protein and states she is following her eating plan approximately 0% of the time. Charlene [name] states she is doing 0 minutes 0 times per week.  Today's visit was #: 17 Starting weight: 250 lbs Starting date: 03/29/2021 Today's weight: 215 lbs Today's date: 06/07/2022 Total lbs lost to date: 35 Total lbs lost since last in-office visit: 0  Interim History: Patient is weight has crept up over the last month.  She has not been journaling or following her category for eating plan.  Her stress level is high and she has multiple health issues which are distracting her from concentrating on weight loss.  Subjective:   1. Statin intolerance Patient notes decreased myalgias while off statin.  She continues to work on her diet and weight loss.  2. Other migraine without status migrainosus, not intractable Patient has a history of migraine headaches and other headache types recently.  She is unable to take NSAIDs.  3. Insulin resistance Patient was prescribed metformin but she did not start it yet due to dealing with other health issues.  She asks if it is okay to start now.  Assessment/Plan:   1. Statin intolerance Low-cholesterol and lean meats were discussed as well as some plant-based proteins.  2. Other migraine without status migrainosus, not intractable We will refer the patient to Dr. Lucia Gaskins for further evaluation.  3. Insulin resistance Patient agreed to start metformin (no refill needed), and she will continue to work on her diet and weight loss efforts.  4. BMI 34.0-34.9,adult  5. Obesity, Beginning BMI 40.35 Charlene [name] is currently in the action stage of change. As such, her goal is to continue with weight loss efforts. She has  agreed to keeping a food journal and adhering to recommended goals of 1800 calories and 120+ grams of protein daily.   Behavioral modification strategies: emotional eating strategies and keeping a strict food journal.  Charlene [name] has agreed to follow-up with our clinic in 4 weeks. She was informed of the importance of frequent follow-up visits to maximize her success with intensive lifestyle modifications for her multiple health conditions.   Objective:   Blood pressure 133/84, pulse 93, temperature 98.1 F (36.7 C), height 5\' 6"  (1.676 m), weight 215 lb (97.5 kg), last menstrual period 05/08/2022, SpO2 99 %. Body mass index is 34.7 kg/m.  Lab Results  Component Value Date   CREATININE 0.73 04/25/2022   BUN 12 04/25/2022   NA 138 04/25/2022   K 4.2 04/25/2022   CL 104 04/25/2022   CO2 27 04/25/2022   Lab Results  Component Value Date   ALT 18 04/25/2022   AST 22 04/25/2022   ALKPHOS 46 04/25/2022   BILITOT 0.3 04/25/2022   Lab Results  Component Value Date   HGBA1C 5.1 11/08/2021   HGBA1C 5.4 03/29/2021   HGBA1C 4.9 05/05/2018   Lab Results  Component Value Date   INSULIN 15.6 11/08/2021   INSULIN 15.6 03/29/2021   Lab Results  Component Value Date   TSH 1.480 03/29/2021   Lab Results  Component Value Date   CHOL 144 11/08/2021   HDL 48 11/08/2021   LDLCALC 77 11/08/2021   LDLDIRECT 236.0 12/22/2019   TRIG 106 11/08/2021   CHOLHDL 3 06/17/2020  Lab Results  Component Value Date   VD25OH 68.7 03/28/2022   VD25OH 68.0 11/08/2021   VD25OH 19.20 (L) 06/17/2020   Lab Results  Component Value Date   WBC 6.1 04/25/2022   HGB 13.9 05/16/2022   HCT 41.1 05/16/2022   MCV 87.4 04/25/2022   PLT 308.0 04/25/2022   Lab Results  Component Value Date   IRON 200 (H) 05/16/2022   TIBC 351.4 05/16/2022   FERRITIN 10.6 05/16/2022   Attestation Statements:   Reviewed by clinician on day of visit: allergies, medications, problem list, medical history, surgical  history, family history, social history, and previous encounter notes.  Time spent on visit including pre-visit chart review and post-visit care and charting was 40 minutes.   I, Burt Knack, am acting as transcriptionist for Quillian Quince, MD.  I have reviewed the above documentation for accuracy and completeness, and I agree with the above. -  Quillian Quince, MD

## 2022-06-19 NOTE — Telephone Encounter (Signed)
error 

## 2022-06-30 ENCOUNTER — Other Ambulatory Visit (INDEPENDENT_AMBULATORY_CARE_PROVIDER_SITE_OTHER): Payer: Self-pay | Admitting: Family Medicine

## 2022-06-30 DIAGNOSIS — E7849 Other hyperlipidemia: Secondary | ICD-10-CM

## 2022-07-02 ENCOUNTER — Encounter (INDEPENDENT_AMBULATORY_CARE_PROVIDER_SITE_OTHER): Payer: Self-pay | Admitting: Family Medicine

## 2022-07-02 NOTE — Telephone Encounter (Signed)
Dr Dalbert Garnet.  Patient sending results of labs before tomorrows appointment.

## 2022-07-03 ENCOUNTER — Ambulatory Visit (INDEPENDENT_AMBULATORY_CARE_PROVIDER_SITE_OTHER): Payer: 59 | Admitting: Family Medicine

## 2022-07-03 ENCOUNTER — Other Ambulatory Visit (INDEPENDENT_AMBULATORY_CARE_PROVIDER_SITE_OTHER): Payer: Self-pay | Admitting: Family Medicine

## 2022-07-03 ENCOUNTER — Encounter (INDEPENDENT_AMBULATORY_CARE_PROVIDER_SITE_OTHER): Payer: Self-pay | Admitting: Family Medicine

## 2022-07-03 VITALS — BP 123/82 | HR 99 | Temp 98.2°F | Ht 66.0 in | Wt 214.0 lb

## 2022-07-03 DIAGNOSIS — E669 Obesity, unspecified: Secondary | ICD-10-CM | POA: Diagnosis not present

## 2022-07-03 DIAGNOSIS — R632 Polyphagia: Secondary | ICD-10-CM | POA: Diagnosis not present

## 2022-07-03 DIAGNOSIS — D509 Iron deficiency anemia, unspecified: Secondary | ICD-10-CM

## 2022-07-03 DIAGNOSIS — E7849 Other hyperlipidemia: Secondary | ICD-10-CM | POA: Diagnosis not present

## 2022-07-03 DIAGNOSIS — Z6834 Body mass index (BMI) 34.0-34.9, adult: Secondary | ICD-10-CM

## 2022-07-03 MED ORDER — METFORMIN HCL 500 MG PO TABS
500.0000 mg | ORAL_TABLET | Freq: Every day | ORAL | 0 refills | Status: DC
Start: 2022-07-03 — End: 2022-08-21

## 2022-07-04 ENCOUNTER — Encounter: Payer: Self-pay | Admitting: Gastroenterology

## 2022-07-04 ENCOUNTER — Ambulatory Visit (INDEPENDENT_AMBULATORY_CARE_PROVIDER_SITE_OTHER): Payer: 59 | Admitting: Gastroenterology

## 2022-07-04 VITALS — BP 120/78 | HR 76 | Ht 66.0 in | Wt 218.0 lb

## 2022-07-04 DIAGNOSIS — K2100 Gastro-esophageal reflux disease with esophagitis, without bleeding: Secondary | ICD-10-CM

## 2022-07-04 DIAGNOSIS — Z903 Acquired absence of stomach [part of]: Secondary | ICD-10-CM

## 2022-07-04 DIAGNOSIS — B356 Tinea cruris: Secondary | ICD-10-CM | POA: Diagnosis not present

## 2022-07-04 DIAGNOSIS — K641 Second degree hemorrhoids: Secondary | ICD-10-CM

## 2022-07-04 MED ORDER — MICONAZOLE NITRATE 2 % EX CREA: 1.0000 | TOPICAL_CREAM | Freq: Two times a day (BID) | CUTANEOUS | 1 refills | Status: DC

## 2022-07-04 NOTE — Progress Notes (Signed)
PROCEDURE NOTE: The patient presents with symptomatic grade II  hemorrhoids, requesting rubber band ligation of his/her hemorrhoidal disease.  All risks, benefits and alternative forms of therapy were described and informed consent was obtained.  In the Left Lateral Decubitus position anoscopic examination revealed grade II hemorrhoids in the right posterior, left lateral and right anterior position(s).  The anorectum was pre-medicated with 0.125% nitroglycerin and RectiCare The decision was made to band the Right posterior internal hemorrhoid, and the [facility] O'Regan System was used to perform band ligation without complication.  Digital anorectal examination was then performed to assure proper positioning of the band, and to adjust the banded tissue as required.  The patient was discharged home without pain or other issues.  Dietary and behavioral recommendations were given and along with follow-up instructions.     The following adjunctive treatments were recommended:  Miconazole 2% cream in the perianal area for possible tinea cruris Follow-up with dermatology for perianal psoriasis  The patient will return in 4 weeks for  follow-up and possible additional banding as required. No complications were encountered and the patient tolerated the procedure well.  Iona Beard , MD [phone]

## 2022-07-04 NOTE — Progress Notes (Unsigned)
Chief Complaint:   OBESITY Charlene [name] is here to discuss her progress with her obesity treatment plan along with follow-up of her obesity related diagnoses. Charlene [name] is on keeping a food journal and adhering to recommended goals of 1800 calories and 120+ grams of protein and states she is following her eating plan approximately 5-10% of the time. Charlene [name] states she is doing 0 minutes 0 times per week.  Today's visit was #: 18 Starting weight: 250 lbs Starting date: 03/29/2021 Today's weight: 214 lbs Today's date: 07/03/2022 Total lbs lost to date: 36 Total lbs lost since last in-office visit: 1  Interim History: Patient has not been able to concentrate on her meal planning and prepping.  She is feeling very tired as she is dealing with iron deficiency anemia.  Subjective:   1. Iron deficiency anemia, unspecified iron deficiency anemia type Patient is resistant to oral supplementation and she is feeling very tired, and it is strongly affecting the quality of her life.  2. Other hyperlipidemia Patient has mixed hyperlipidemia with statin intolerance.  3. Polyphagia Patient is on metformin, but she is struggling to follow her eating plan.  Assessment/Plan:   1. Iron deficiency anemia, unspecified iron deficiency anemia type We will refer the patient to hematology for evaluation and treatment.  - Ambulatory referral to Hematology / Oncology  2. Other hyperlipidemia We will refer the patient to lipid clinic to look at treatment options.  - [facility]B Referral to [facility]  3. Polyphagia We will refill metformin for 1 month.  Patient is to work on eating the food on her plan to decrease polyphagia.  We will follow closely.  - metFORMIN (GLUCOPHAGE) 500 MG tablet; Take 1 tablet (500 mg total) by mouth daily.  Dispense: 30 tablet; Refill: 0  4. BMI 34.0-34.9,adult  5. Obesity, Beginning BMI 40.35 Charlene [name] is currently in the action stage of change. As such, her goal is  to continue with weight loss efforts. She has agreed to keeping a food journal and adhering to recommended goals of 1800 calories and 120+ grams of protein daily.   Behavioral modification strategies: increasing lean protein intake.  Charlene [name] has agreed to follow-up with our clinic in 4 weeks. She was informed of the importance of frequent follow-up visits to maximize her success with intensive lifestyle modifications for her multiple health conditions.   Objective:   Blood pressure 123/82, pulse 99, temperature 98.2 F (36.8 C), height 5\' 6"  (1.676 m), weight 214 lb (97.1 kg), SpO2 99 %. Body mass index is 34.54 kg/m.  Lab Results  Component Value Date   CREATININE 0.73 04/25/2022   BUN 12 04/25/2022   NA 138 04/25/2022   K 4.2 04/25/2022   CL 104 04/25/2022   CO2 27 04/25/2022   Lab Results  Component Value Date   ALT 18 04/25/2022   AST 22 04/25/2022   ALKPHOS 46 04/25/2022   BILITOT 0.3 04/25/2022   Lab Results  Component Value Date   HGBA1C 5.1 11/08/2021   HGBA1C 5.4 03/29/2021   HGBA1C 4.9 05/05/2018   Lab Results  Component Value Date   INSULIN 15.6 11/08/2021   INSULIN 15.6 03/29/2021   Lab Results  Component Value Date   TSH 1.480 03/29/2021   Lab Results  Component Value Date   CHOL 144 11/08/2021   HDL 48 11/08/2021   LDLCALC 77 11/08/2021   LDLDIRECT 236.0 12/22/2019   TRIG 106 11/08/2021   CHOLHDL 3 06/17/2020   Lab Results  Component Value Date   VD25OH 68.7 03/28/2022   VD25OH 68.0 11/08/2021   VD25OH 19.20 (L) 06/17/2020   Lab Results  Component Value Date   WBC 6.1 04/25/2022   HGB 13.9 05/16/2022   HCT 41.1 05/16/2022   MCV 87.4 04/25/2022   PLT 308.0 04/25/2022   Lab Results  Component Value Date   IRON 200 (H) 05/16/2022   TIBC 351.4 05/16/2022   FERRITIN 10.6 05/16/2022   Attestation Statements:   Reviewed by clinician on day of visit: allergies, medications, problem list, medical history, surgical history, family history,  social history, and previous encounter notes.   I, Burt Knack, am acting as transcriptionist for Quillian Quince, MD.  I have reviewed the above documentation for accuracy and completeness, and I agree with the above. -  Quillian Quince, MD

## 2022-07-04 NOTE — Patient Instructions (Addendum)
HEMORRHOID BANDING PROCEDURE  ? ? FOLLOW-UP CARE ? ? ?The procedure you have had should have been relatively painless since the banding of the area involved does not have nerve endings and there is no pain sensation.  The rubber band cuts off the blood supply to the hemorrhoid and the band may fall off as soon as 48 hours after the banding (the band may occasionally be seen in the toilet bowl following a bowel movement). You may notice a temporary feeling of fullness in the rectum which should respond adequately to plain Tylenol? or Motrin?. ? ?Following the banding, avoid strenuous exercise that evening and resume full activity the next day.  A sitz bath (soaking in a warm tub) or bidet is soothing, and can be useful for cleansing the area after bowel movements.   ? ? ?To avoid constipation, take two tablespoons of natural wheat bran, natural oat bran, flax, Benefiber? or any over the counter fiber supplement and increase your water intake to 7-8 glasses daily.   ? ?Unless you have been prescribed anorectal medication, do not put anything inside your rectum for two weeks: No suppositories, enemas, fingers, etc. ? ?Occasionally, you may have more bleeding than usual after the banding procedure.  This is often from the untreated hemorrhoids rather than the treated one.  Don?t be concerned if there is a tablespoon or so of blood.  If there is more blood than this, lie flat with your bottom higher than your head and apply an ice pack to the area. If the bleeding does not stop within a half an hour or if you feel faint, call our office at [phone] or go to the emergency room. ? ?Problems are not common; however, if there is a substantial amount of bleeding, severe pain, chills, fever or difficulty passing urine (very rare) or other problems, you should call us at [phone] or report to the nearest emergency room. ? ?Do not stay seated continuously for more than 2-3 hours for a day or two after the procedure.   Tighten your buttock muscles 10-15 times every two hours and take 10-15 deep breaths every 1-2 hours.  Do not spend more than a few minutes on the toilet if you cannot empty your bowel; instead re-visit the toilet at a later time. ? ? I appreciate the  opportunity to care for you ? ?Thank You  ? ?Kavitha Nandigam , MD  ?

## 2022-07-11 ENCOUNTER — Encounter (INDEPENDENT_AMBULATORY_CARE_PROVIDER_SITE_OTHER): Payer: Self-pay | Admitting: Family Medicine

## 2022-07-16 ENCOUNTER — Encounter (HOSPITAL_BASED_OUTPATIENT_CLINIC_OR_DEPARTMENT_OTHER): Payer: Self-pay

## 2022-07-16 ENCOUNTER — Other Ambulatory Visit: Payer: Self-pay

## 2022-07-16 ENCOUNTER — Emergency Department (HOSPITAL_BASED_OUTPATIENT_CLINIC_OR_DEPARTMENT_OTHER): Payer: 59

## 2022-07-16 ENCOUNTER — Emergency Department (HOSPITAL_BASED_OUTPATIENT_CLINIC_OR_DEPARTMENT_OTHER)
Admission: EM | Admit: 2022-07-16 | Discharge: 2022-07-16 | Disposition: A | Discharge status: Home / Self Care | Payer: 59 | Attending: Emergency Medicine | Admitting: Emergency Medicine

## 2022-07-16 DIAGNOSIS — S161XXA Strain of muscle, fascia and tendon at neck level, initial encounter: Secondary | ICD-10-CM | POA: Diagnosis not present

## 2022-07-16 DIAGNOSIS — W228XXA Striking against or struck by other objects, initial encounter: Secondary | ICD-10-CM | POA: Diagnosis not present

## 2022-07-16 DIAGNOSIS — S060X0A Concussion without loss of consciousness, initial encounter: Secondary | ICD-10-CM

## 2022-07-16 DIAGNOSIS — S0990XA Unspecified injury of head, initial encounter: Secondary | ICD-10-CM | POA: Diagnosis present

## 2022-07-16 MED ORDER — CYCLOBENZAPRINE HCL 10 MG PO TABS: 10.0000 mg | ORAL_TABLET | Freq: Two times a day (BID) | ORAL | 0 refills | Status: DC | PRN

## 2022-07-16 MED ORDER — ONDANSETRON HCL 4 MG PO TABS: 4.0000 mg | ORAL_TABLET | Freq: Four times a day (QID) | ORAL | 0 refills | Status: AC

## 2022-07-16 MED ORDER — METOCLOPRAMIDE HCL 10 MG PO TABS
10.0000 mg | ORAL_TABLET | Freq: Once | ORAL | Status: AC
  Administered 2022-07-16: 10 mg via ORAL
  Filled 2022-07-16: qty 1

## 2022-07-16 MED ORDER — METOCLOPRAMIDE HCL 10 MG PO TABS: 10.0000 mg | ORAL_TABLET | Freq: Three times a day (TID) | ORAL | 0 refills | Status: DC | PRN

## 2022-07-16 NOTE — Telephone Encounter (Signed)
FYI

## 2022-07-16 NOTE — ED Provider Notes (Signed)
[place] EMERGENCY DEPARTMENT AT [facility] Provider Note   CSN: [id number] Arrival date & time: 07/16/22  1045     History  Chief Complaint  Patient presents with   Head Injury    Charlene [name] is a 42 y.o. female with history of MS presenting to ED with a head injury.  Patient reports that she accidentally struck her head hard against the wall yesterday while she was flicking her hair back.  She reports that she felt stunned afterwards, dazed.  She says she has had a posterior headache since then.  Last night she did have some lancinating pain on the right side of her neck but that has improved this morning.  No facial pain.  She has some mild nausea.  She reports she has had some intermittent arm and leg weakness which is not unusual with her MS.  She feels tired.  HPI     Home Medications Prior to Admission medications   Medication Sig Start Date End Date Taking? Authorizing Provider  cyclobenzaprine (FLEXERIL) 10 MG tablet Take 1 tablet (10 mg total) by mouth 2 (two) times daily as needed for up to 10 doses for muscle spasms. 07/16/22  Yes Nyree Yonker, Kermit Balo, MD  metoCLOPramide (REGLAN) 10 MG tablet Take 1 tablet (10 mg total) by mouth every 8 (eight) hours as needed for up to 10 doses for nausea. 07/16/22  Yes Terald Sleeper, MD  ondansetron (ZOFRAN) 4 MG tablet Take 1 tablet (4 mg total) by mouth every 6 (six) hours for 12 days. 07/16/22 07/28/22 Yes Jeryl Umholtz, Kermit Balo, MD  acitretin (SORIATANE) 10 MG capsule Take 10 mg by mouth daily before breakfast.    [provider]  ALPRAZolam Prudy Feeler) 1 MG tablet Take 2 pills about 30 min prior to the MRI. Can take additional tablet at the time of test if needed.Can cause drowsiness. Must have driver to and from the test 05/22/22   Sater, Pearletha Furl, MD  hydrocortisone (ANUSOL-HC) 25 MG suppository Place 1 suppository (25 mg total) rectally at bedtime as needed for hemorrhoids or anal itching (for 10 days). 05/16/22   Napoleon Form, MD  lamoTRIgine (LAMICTAL) 150 MG tablet Take 200 mg by mouth daily. 10/25/21   [provider]  linaclotide Karlene Einstein) 145 MCG CAPS capsule Take 1 capsule (145 mcg total) by mouth daily before breakfast. 05/16/22   Nandigam, Eleonore Chiquito, MD  metFORMIN (GLUCOPHAGE) 500 MG tablet Take 1 tablet (500 mg total) by mouth daily. 07/03/22 08/02/22  Quillian Quince D, MD  miconazole (MICOTIN) 2 % cream Apply 1 Application topically 2 (two) times daily. 07/04/22   Napoleon Form, MD  Multiple Vitamin (MULTIVITAMIN) tablet Take 1 tablet by mouth daily.    [provider]  naltrexone (DEPADE) 50 MG tablet Take 50 mg by mouth daily. 03/23/20   [provider]  omeprazole (PRILOSEC) 40 MG capsule Take 1 capsule (40 mg total) by mouth daily. 05/22/22   Napoleon Form, MD  Vitamin D, Ergocalciferol, (DRISDOL) 1.25 MG (50000 UNIT) CAPS capsule Take 1 capsule (50,000 Units total) by mouth every 7 (seven) days. 01/03/22   Quillian Quince D, MD  VTAMA 1 % CREA Apply 1 Application topically daily. 05/03/22   [provider]  VYVANSE 60 MG capsule Take 60 mg by mouth every morning. 05/02/22   [provider]  zolmitriptan (ZOMIG) 5 MG tablet Take 1 p.o. as needed for migraine.  May repeat in 2 hours.  No more than  2 pills in a day or 4 pills in any week. 12/06/21   Sater, Pearletha Furl, MD      Allergies    Ciprofloxacin, Prednisone, African mango [irvingia gabonensis], Atorvastatin, and Rosuvastatin    Review of Systems   Review of Systems  Physical Exam Updated Vital Signs BP (!) 152/95 (BP Location: Right Arm)   Pulse 100   Temp 99.2 F (37.3 C) (Oral)   Resp 16   Ht 5\' 6"  (1.676 m)   Wt 98.9 kg   SpO2 100%   BMI 35.19 kg/m  Physical Exam Constitutional:      General: She is not in acute distress. HENT:     Head: Normocephalic and atraumatic.  Eyes:     Conjunctiva/sclera: Conjunctivae normal.     Pupils: Pupils are equal, round, and reactive to  light.  Neck:     Comments: No cervical midline or paracervical tenderness on exam Cardiovascular:     Rate and Rhythm: Normal rate and regular rhythm.  Pulmonary:     Effort: Pulmonary effort is normal. No respiratory distress.  Skin:    General: Skin is warm and dry.  Neurological:     General: No focal deficit present.     Mental Status: She is alert and oriented to person, place, and time. Mental status is at baseline.  Psychiatric:        Mood and Affect: Mood normal.        Behavior: Behavior normal.     ED Results / Procedures / Treatments   Labs (all labs ordered are listed, but only abnormal results are displayed) Labs Reviewed - No data to display  EKG None  Radiology CT Head Wo Contrast  Result Date: 07/16/2022 CLINICAL DATA:  Head trauma, moderate-severe EXAM: CT HEAD WITHOUT CONTRAST TECHNIQUE: Contiguous axial images were obtained from the base of the skull through the vertex without intravenous contrast. RADIATION DOSE REDUCTION: This exam was performed according to the departmental dose-optimization program which includes automated exposure control, adjustment of the mA and/or kV according to patient size and/or use of iterative reconstruction technique. COMPARISON:  None Available. FINDINGS: Brain: No evidence of acute infarction, hemorrhage, hydrocephalus, extra-axial collection or mass lesion/mass effect. Vascular: No hyperdense vessel or unexpected calcification. Skull: Normal. Negative for fracture or focal lesion. Sinuses/Orbits: No middle ear or mastoid effusion. Paranasal sinuses are clear. Orbits are unremarkable. Other: None. IMPRESSION: No CT evidence of intracranial injury. Electronically Signed   By: Lorenza Cambridge M.D.   On: 07/16/2022 11:46    Procedures Procedures    Medications Ordered in ED Medications  metoCLOPramide (REGLAN) tablet 10 mg (has no administration in time range)    ED Course/ Medical Decision Making/ A&P                              Medical Decision Making Amount and/or Complexity of Data Reviewed Radiology: ordered.   Patient is presenting with a posterior head injury yesterday from striking her head on a wall.  CT imaging was ordered in triage and personally reviewed and interpreted with no acute intracranial injury or fracture.  I have a low suspicion for cervical fracture with no midline tenderness, low suspicion for vertebral dissection with delayed onset right-sided neck pain which then resolved, more likely a muscle strain.    We discussed some oral migraine medications.  She does not want IV or IM medications.  We will give her single dose  to prescribe some for home.  I suspect this is a concussion and discussed this with her.  Concussion precautions advised.  Okay for discharge        Final Clinical Impression(s) / ED Diagnoses Final diagnoses:  Concussion without loss of consciousness, initial encounter  Injury of head, initial encounter  Strain of neck muscle, initial encounter    Rx / DC Orders ED Discharge Orders          Ordered    metoCLOPramide (REGLAN) 10 MG tablet  Every 8 hours PRN        07/16/22 1229    ondansetron (ZOFRAN) 4 MG tablet  Every 6 hours        07/16/22 1229    cyclobenzaprine (FLEXERIL) 10 MG tablet  2 times daily PRN        07/16/22 1229              Terald Sleeper, MD 07/16/22 1230

## 2022-07-16 NOTE — ED Triage Notes (Signed)
Reports yesterday went to fling hair back and slammed the back of her head into a concrete wall and saw white flashes.  Patient emotional in triage.  At the time nothing hurt but it stunned her.  Now having pain in the back of her head.  She reports intermittent nausea but that is normal for her.  Reports since incident has felt more sluggish and tired.  Reports pain now goes into the right side of her neck and down into her. Woke up this am with severe headache.  Took headache medication and took a shower and reports her legs were weak.

## 2022-07-17 ENCOUNTER — Telehealth: Payer: Self-pay

## 2022-07-17 NOTE — Transitions of Care (Post Inpatient/ED Visit) (Signed)
07/17/2022  Name: Charlene [name] MRN: [id number] DOB: 09-Mar-1980  Today's TOC FU Call Status: Today's TOC FU Call Status:: Successful TOC FU Call Competed TOC FU Call Complete Date: 07/17/22  Transition Care Management Follow-up Telephone Call Date of Discharge: 07/16/22 Discharge Facility: [facility] ([facility]) Type of Discharge: Emergency Department Reason for ED Visit: Other: How have you been since you were released from the hospital?: Better Any questions or concerns?: No  Items Reviewed: Did you receive and understand the discharge instructions provided?: Yes Medications obtained,verified, and reconciled?: Yes (Medications Reviewed) Any new allergies since your discharge?: No Dietary orders reviewed?: No Do you have support at home?: Yes People in Home: spouse Name of Support/Comfort Primary Source: Pennie Rushing  Medications Reviewed Today: Medications Reviewed Today     Reviewed by Lenard Simmer, CMA (Certified Medical Assistant) on 07/17/22 at 1230  Med List Status: <None>   Medication Order Taking? Sig Documenting Provider Last Dose Status Informant  acitretin (SORIATANE) 10 MG capsule [id number] No Take 10 mg by mouth daily before breakfast.  Patient not taking: Reported on 07/17/2022   [provider] Not Taking Active   ALPRAZolam Prudy Feeler) 1 MG tablet [id number] No Take 2 pills about 30 min prior to the MRI. Can take additional tablet at the time of test if needed.Can cause drowsiness. Must have driver to and from the test  Patient not taking: Reported on 07/17/2022   Asa Lente, MD Not Taking Active            Med Note Margo Aye   Tue May 22, 2022  1:09 PM) For mri in future  cyclobenzaprine (FLEXERIL) 10 MG tablet [id number] Yes Take 1 tablet (10 mg total) by mouth 2 (two) times daily as needed for up to 10 doses for muscle spasms. Terald Sleeper, MD Taking Active   hydrocortisone (ANUSOL-HC) 25 MG suppository [id number] No Place 1 suppository (25 mg  total) rectally at bedtime as needed for hemorrhoids or anal itching (for 10 days).  Patient not taking: Reported on 07/17/2022   Napoleon Form, MD Not Taking Active   lamoTRIgine (LAMICTAL) 150 MG tablet [id number] Yes Take 200 mg by mouth daily. [provider] Taking Active   linaclotide Karlene Einstein) 145 MCG CAPS capsule [id number] Yes Take 1 capsule (145 mcg total) by mouth daily before breakfast. Napoleon Form, MD Taking Active   metFORMIN (GLUCOPHAGE) 500 MG tablet [id number] Yes Take 1 tablet (500 mg total) by mouth daily. Quillian Quince D, MD Taking Active   metoCLOPramide (REGLAN) 10 MG tablet [id number] Yes Take 1 tablet (10 mg total) by mouth every 8 (eight) hours as needed for up to 10 doses for nausea. Terald Sleeper, MD Taking Active   miconazole (MICOTIN) 2 % cream [id number] No Apply 1 Application topically 2 (two) times daily.  Patient not taking: Reported on 07/17/2022   Napoleon Form, MD Not Taking Active   Multiple Vitamin (MULTIVITAMIN) tablet [id number] Yes Take 1 tablet by mouth daily. [provider] Taking Active   naltrexone (DEPADE) 50 MG tablet [id number] Yes Take 50 mg by mouth daily. [provider] Taking Active   omeprazole (PRILOSEC) 40 MG capsule [id number] Yes Take 1 capsule (40 mg total) by mouth daily. Napoleon Form, MD Taking Active   ondansetron (ZOFRAN) 4 MG tablet [id number] Yes Take 1 tablet (4 mg total) by mouth every 6 (six) hours for 12 days. Terald Sleeper, MD Taking Active   Vitamin D, Ergocalciferol, (DRISDOL) 1.25 MG (  50000 UNIT) CAPS capsule [id number] Yes Take 1 capsule (50,000 Units total) by mouth every 7 (seven) days. Quillian Quince D, MD Taking Active   VTAMA 1 % CREA [id number] Yes Apply 1 Application topically daily. [provider] Taking Active   VYVANSE 60 MG capsule [id number] Yes Take 60 mg by mouth every morning. [provider] Taking Active   zolmitriptan (ZOMIG) 5 MG tablet  [id number] Yes Take 1 p.o. as needed for migraine.  May repeat in 2 hours.  No more than 2 pills in a day or 4 pills in any week. Sater, Pearletha Furl, MD Taking Active             Home Care and Equipment/Supplies: Were Home Health Services Ordered?: No Any new equipment or medical supplies ordered?: No  Functional Questionnaire: Do you need assistance with bathing/showering or dressing?: No Do you need assistance with meal preparation?: No Do you need assistance with eating?: No Do you have difficulty maintaining continence: No Do you need assistance with getting out of bed/getting out of a chair/moving?: No Do you have difficulty managing or taking your medications?: No  Follow up appointments reviewed: PCP Follow-up appointment confirmed?: No (Patient declines follow up states she feels fine) MD Provider Line [phone] Given: Yes Specialist Hospital Follow-up appointment confirmed?: NA Do you need transportation to your follow-up appointment?: No Do you understand care options if your condition(s) worsen?: Yes-patient verbalized understanding    SIGNATURE Lenard Simmer, CMA

## 2022-07-18 ENCOUNTER — Encounter: Payer: Self-pay | Admitting: Family Medicine

## 2022-07-20 NOTE — Telephone Encounter (Signed)
I spoke to the pt and she states she had called the airline and they already returned some of her money she states that they told her a note will not have helped .

## 2022-07-31 ENCOUNTER — Other Ambulatory Visit (INDEPENDENT_AMBULATORY_CARE_PROVIDER_SITE_OTHER): Payer: Self-pay | Admitting: Family Medicine

## 2022-07-31 ENCOUNTER — Other Ambulatory Visit: Payer: 59

## 2022-07-31 ENCOUNTER — Encounter: Payer: 59 | Admitting: Internal Medicine

## 2022-07-31 ENCOUNTER — Ambulatory Visit (INDEPENDENT_AMBULATORY_CARE_PROVIDER_SITE_OTHER): Payer: 59 | Admitting: Family Medicine

## 2022-07-31 DIAGNOSIS — R632 Polyphagia: Secondary | ICD-10-CM

## 2022-08-08 ENCOUNTER — Telehealth: Payer: Self-pay | Admitting: Gastroenterology

## 2022-08-08 NOTE — Telephone Encounter (Signed)
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 She said it was a part of the discussion at her most recent office visit.  Please advise.

## 2022-08-08 NOTE — Telephone Encounter (Signed)
Inbound call from patient stating she was suppose to have a referral for her sent to [facility]. Stated they never received a referral. Requesting a call back to discuss referral further. Please advise, thank you.

## 2022-08-08 NOTE — Telephone Encounter (Signed)
Please send referral to [facility] bariatric surgery Dr. Andrey Campanile if the referral was not sent.  My understanding is the referral was sent at the time of her last hemorrhoid banding visit as patient wanted to proceed with it.  Thank you

## 2022-08-09 NOTE — Telephone Encounter (Signed)
Records faxed to Dr Andrey Campanile at [facility]. Patient notified.

## 2022-08-10 NOTE — Progress Notes (Signed)
[facility] [phone]   [phone]  CONSULT NOTE  REFERRING PHYSICIAN: Dr. Malena Edman MD  REASON FOR CONSULTATION:  Iron deficiency anemia  HPI Charlene [name] is a 42 y.o. female with a past medical history of migraines, multiple sclerosis, psoriasis, HLD, bipolar disorder, borderline personality disorder, vitamin D, status post laparoscopic sleeve gastrectomy and *** is referred to the clinic for IDA.   It was referred by her family medicine provider.  She had followed up with them on 07/03/2022 for the chief complaint of obesity related diagnosis.  As mentioned in her office note that the patient is a history of iron deficiency but the patient is reluctant to take iron tablets.  Therefore she was referred to the clinic to discuss other options  To the patient's knowledge, it seems the anemia ***. The oldest records available to me ar from 2013. It appears the anemia started ***. She had mild anemia in 2015 and 2016 but the lowest Hbg was 10.6.  She had mild anemia in 2023 which improved.  Her Hbg from 04/25/22 was normal The anemia improved after that from *** .  Iron studies from April 2024 show slightly low saturation 12, ferritin on the low normal at 17, her iron was 44. She has never needed a blood transfusion or iron infusion before.    He reports she has heavy menstrual cycles which last *** days with ***associated clots. Frequency ***. She states that her menstrual cycles are unchanged from her baseline.  She sees *** from GYN.    Overall regarding symptoms, she reports ***fatigue, ***lightheadedness, ***dyspnea on exertion. She denies gingival bleeding, epistaxis, hemoptysis, hematemesis, hematochezia, or melena.  She denies any ibuprofen use and takes Tylenol. Denies blood thinner use. She had an EGD on 05/22/22 which showed gastritis and erosive esophagitis with no bleeding. She had a colonoscopy on 10/17/20 showing non-bleeding external and internal  hemorrhoids that were medium-sized   She denies any lymphadenopathy. She denies any particular dietary habits such as being a vegan or vegetarian, however, she ***. She does crave ice chips.   Resistant to oral supplementation fatigue. Referral   Bariatric surgery, appointment   HPI  Past Medical History:  Diagnosis Date   Alcoholism (HCC)    Anxiety    Anxiety and depression    Bipolar 2 disorder (HCC)    Borderline personality disorder (HCC)    Chest pain    Chronic fatigue syndrome    Claustrophobia    on meds   Constipation    Depression    on meds   Elevated blood sugar 06/08/2014   Elevated LFTs    Fatigue    Fatty liver 10/2014   GERD (gastroesophageal reflux disease)    diet related   Heart murmur    Hyperlipidemia    on meds   Infertility associated with anovulation    Joint pain    Low grade squamous intraepithelial lesion (LGSIL) on Pap smear 10/30/2011   Major depressive disorder    Migraines    MS (multiple sclerosis) (HCC)    Neuromuscular disorder (HCC)    Obesity    Obesity in pregnancy, antepartum    PCOS (polycystic ovarian syndrome)    Personal history of pre-term labor    Prediabetes    Pregnancy induced hypertension    pregnancy related- hx of   Psoriasis    Sleep apnea    SOB (shortness of breath)    Vitamin D deficiency  Past Surgical History:  Procedure Laterality Date   COLONOSCOPY  10/17/2020   DILATION AND CURETTAGE OF UTERUS  08/15/2008   DILATION AND EVACUATION  12/29/2011   Procedure: DILATATION AND EVACUATION;  Surgeon: Catalina Antigua, MD;  Location: [facility];  Service: Gynecology;;  Dr, Emelda Fear transferred case to Dr. Jolayne Panther   LAPAROSCOPIC GASTRIC SLEEVE RESECTION N/A 12/07/2014   Procedure: LAPAROSCOPIC GASTRIC SLEEVE RESECTION;  Surgeon: Ovidio Kin, MD;  Location: [facility];  Service: General;  Laterality: N/A;    Family History  Problem Relation Age of Onset   Hypertension Mother    Hyperlipidemia Mother     Mental illness Mother    Dementia Mother        early onset   Depression Mother    Anxiety disorder Mother    Drug abuse Mother    Eating disorder Mother    Alcoholism Father    Heart disease Father    Alcoholism Maternal Grandmother    Mental illness Maternal Grandmother    Esophageal cancer Maternal Grandmother    Heart attack Maternal Grandfather    Colon cancer Neg Hx    Stomach cancer Neg Hx    Colon polyps Neg Hx    Rectal cancer Neg Hx     Social History Social History   Tobacco Use   Smoking status: Former    Current packs/day: 0.00    Average packs/day: 1 pack/day for 15.0 years (15.0 ttl pk-yrs)    Types: Cigarettes    Start date: 07/02/1995    Quit date: 07/02/2010    Years since quitting: 12.1   Smokeless tobacco: Never  Vaping Use   Vaping status: Never Used  Substance Use Topics   Alcohol use: Not Currently    Comment: recovery alcoholic   Drug use: No    Allergies  Allergen Reactions   Ciprofloxacin Other (See Comments)    Other reaction(s): Mental Status Changes  Manic episode   Manic episode Manic episode Other reaction(s): Mental Status Changes  Manic episode   Prednisone Other (See Comments)    Other reaction(s):  Puts her in manic episode. She is Bipolar   Puts her in manic episode. She is Bipolar Puts her in manic episode. She is Bipolar Other reaction(s):  Puts her in manic episode. She is Bipolar   African Mango [Irvingia Gabonensis] Other (See Comments)   Atorvastatin Other (See Comments)    Myalgias   Rosuvastatin Other (See Comments)    Myalgias    Current Outpatient Medications  Medication Sig Dispense Refill   acitretin (SORIATANE) 10 MG capsule Take 10 mg by mouth daily before breakfast. (Patient not taking: Reported on 07/17/2022)     ALPRAZolam (XANAX) 1 MG tablet Take 2 pills about 30 min prior to the MRI. Can take additional tablet at the time of test if needed.Can cause drowsiness. Must have driver to and from the test  (Patient not taking: Reported on 07/17/2022) 3 tablet 0   cyclobenzaprine (FLEXERIL) 10 MG tablet Take 1 tablet (10 mg total) by mouth 2 (two) times daily as needed for up to 10 doses for muscle spasms. 10 tablet 0   hydrocortisone (ANUSOL-HC) 25 MG suppository Place 1 suppository (25 mg total) rectally at bedtime as needed for hemorrhoids or anal itching (for 10 days). (Patient not taking: Reported on 07/17/2022) 12 suppository 1   lamoTRIgine (LAMICTAL) 150 MG tablet Take 200 mg by mouth daily.     linaclotide (LINZESS) 145 MCG CAPS capsule Take 1 capsule (145  mcg total) by mouth daily before breakfast. 30 capsule 3   metFORMIN (GLUCOPHAGE) 500 MG tablet Take 1 tablet (500 mg total) by mouth daily. 30 tablet 0   metoCLOPramide (REGLAN) 10 MG tablet Take 1 tablet (10 mg total) by mouth every 8 (eight) hours as needed for up to 10 doses for nausea. 10 tablet 0   miconazole (MICOTIN) 2 % cream Apply 1 Application topically 2 (two) times daily. (Patient not taking: Reported on 07/17/2022) 56.7 g 1   Multiple Vitamin (MULTIVITAMIN) tablet Take 1 tablet by mouth daily.     naltrexone (DEPADE) 50 MG tablet Take 50 mg by mouth daily.     omeprazole (PRILOSEC) 40 MG capsule Take 1 capsule (40 mg total) by mouth daily. 90 capsule 3   Vitamin D, Ergocalciferol, (DRISDOL) 1.25 MG (50000 UNIT) CAPS capsule Take 1 capsule (50,000 Units total) by mouth every 7 (seven) days. 12 capsule 3   VTAMA 1 % CREA Apply 1 Application topically daily.     VYVANSE 60 MG capsule Take 60 mg by mouth every morning.     zolmitriptan (ZOMIG) 5 MG tablet Take 1 p.o. as needed for migraine.  May repeat in 2 hours.  No more than 2 pills in a day or 4 pills in any week. 10 tablet 11   No current facility-administered medications for this visit.    REVIEW OF SYSTEMS:   Review of Systems  Constitutional: Negative for appetite change, chills, fatigue, fever and unexpected weight change.  HENT:   Negative for mouth sores, nosebleeds, sore  throat and trouble swallowing.   Eyes: Negative for eye problems and icterus.  Respiratory: Negative for cough, hemoptysis, shortness of breath and wheezing.   Cardiovascular: Negative for chest pain and leg swelling.  Gastrointestinal: Negative for abdominal pain, constipation, diarrhea, nausea and vomiting.  Genitourinary: Negative for bladder incontinence, difficulty urinating, dysuria, frequency and hematuria.   Musculoskeletal: Negative for back pain, gait problem, neck pain and neck stiffness.  Skin: Negative for itching and rash.  Neurological: Negative for dizziness, extremity weakness, gait problem, headaches, light-headedness and seizures.  Hematological: Negative for adenopathy. Does not bruise/bleed easily.  Psychiatric/Behavioral: Negative for confusion, depression and sleep disturbance. The patient is not nervous/anxious.     PHYSICAL EXAMINATION:  There were no vitals taken for this visit.  ECOG PERFORMANCE STATUS: {CHL ONC ECOG [id number]  Physical Exam  Constitutional: Oriented to person, place, and time and well-developed, well-nourished, and in no distress. No distress.  HENT:  Head: Normocephalic and atraumatic.  Mouth/Throat: Oropharynx is clear and moist. No oropharyngeal exudate.  Eyes: Conjunctivae are normal. Right eye exhibits no discharge. Left eye exhibits no discharge. No scleral icterus.  Neck: Normal range of motion. Neck supple.  Cardiovascular: Normal rate, regular rhythm, normal heart sounds and intact distal pulses.   Pulmonary/Chest: Effort normal and breath sounds normal. No respiratory distress. No wheezes. No rales.  Abdominal: Soft. Bowel sounds are normal. Exhibits no distension and no mass. There is no tenderness.  Musculoskeletal: Normal range of motion. Exhibits no edema.  Lymphadenopathy:    No cervical adenopathy.  Neurological: Alert and oriented to person, place, and time. Exhibits normal muscle tone. Gait normal. Coordination normal.   Skin: Skin is warm and dry. No rash noted. Not diaphoretic. No erythema. No pallor.  Psychiatric: Mood, memory and judgment normal.  Vitals reviewed.  LABORATORY DATA: Lab Results  Component Value Date   WBC 6.1 04/25/2022   HGB 13.9 05/16/2022   HCT 41.1  05/16/2022   MCV 87.4 04/25/2022   PLT 308.0 04/25/2022      Chemistry      Component Value Date/Time   NA 138 04/25/2022 1206   NA 138 11/08/2021 1148   K 4.2 04/25/2022 1206   CL 104 04/25/2022 1206   CO2 27 04/25/2022 1206   BUN 12 04/25/2022 1206   BUN 8 11/08/2021 1148   CREATININE 0.73 04/25/2022 1206   CREATININE 0.43 (L) 12/31/2013 1542      Component Value Date/Time   CALCIUM 9.4 04/25/2022 1206   ALKPHOS 46 04/25/2022 1206   AST 22 04/25/2022 1206   ALT 18 04/25/2022 1206   BILITOT 0.3 04/25/2022 1206   BILITOT <0.2 11/08/2021 1148       RADIOGRAPHIC STUDIES: CT Head Wo Contrast  Result Date: 07/16/2022 CLINICAL DATA:  Head trauma, moderate-severe EXAM: CT HEAD WITHOUT CONTRAST TECHNIQUE: Contiguous axial images were obtained from the base of the skull through the vertex without intravenous contrast. RADIATION DOSE REDUCTION: This exam was performed according to the departmental dose-optimization program which includes automated exposure control, adjustment of the mA and/or kV according to patient size and/or use of iterative reconstruction technique. COMPARISON:  None Available. FINDINGS: Brain: No evidence of acute infarction, hemorrhage, hydrocephalus, extra-axial collection or mass lesion/mass effect. Vascular: No hyperdense vessel or unexpected calcification. Skull: Normal. Negative for fracture or focal lesion. Sinuses/Orbits: No middle ear or mastoid effusion. Paranasal sinuses are clear. Orbits are unremarkable. Other: None. IMPRESSION: No CT evidence of intracranial injury. Electronically Signed   By: Lorenza Cambridge M.D.   On: 07/16/2022 11:46    ASSESSMENT: This is a very pleasant 42 year old Caucasian  female referred to the clinic for iron deficiency.  The patient was seen with Dr. Arbutus Ped today.  She had a CBC, CMP, iron studies, ferritin, folate, and B12 testing performed.  Her labs from today show ***  Iron infusions?  Strongly encouraged the patient to consider iron supplements.  We can send her a prescription which will likely be more tolerable.  We also discussed slow release iron and prenatal iron she was instructed to take iron with vitamin C.  Will see her back for follow-up visit for repeat blood work in 4 months with CBC, iron studies, and ferritin.  The patient voices understanding of current disease status and treatment options and is in agreement with the current care plan.  All questions were answered. The patient knows to call the clinic with any problems, questions or concerns. We can certainly see the patient much sooner if necessary.  Thank you so much for allowing me to participate in the care of Charlene [name]. I will continue to follow up the patient with you and assist in her care.  I spent {CHL ONC TIME VISIT - ZOXWR:[id number]} counseling the patient face to face. The total time spent in the appointment was {CHL ONC TIME VISIT - BJYNW:[id number]}.  Disclaimer: This note was dictated with voice recognition software. Similar sounding words can inadvertently be transcribed and may not be corrected upon review.   Charlene [name] August 10, 2022, 2:53 PM

## 2022-08-13 ENCOUNTER — Other Ambulatory Visit: Payer: Self-pay | Admitting: Physician Assistant

## 2022-08-13 DIAGNOSIS — D509 Iron deficiency anemia, unspecified: Secondary | ICD-10-CM

## 2022-08-14 ENCOUNTER — Inpatient Hospital Stay: Payer: 59 | Admitting: Physician Assistant

## 2022-08-14 ENCOUNTER — Other Ambulatory Visit: Payer: Self-pay

## 2022-08-14 ENCOUNTER — Inpatient Hospital Stay: Payer: 59 | Attending: Physician Assistant

## 2022-08-14 VITALS — BP 135/91 | HR 82 | Temp 97.5°F | Resp 16 | Wt 217.1 lb

## 2022-08-14 DIAGNOSIS — R7303 Prediabetes: Secondary | ICD-10-CM | POA: Diagnosis not present

## 2022-08-14 DIAGNOSIS — K219 Gastro-esophageal reflux disease without esophagitis: Secondary | ICD-10-CM | POA: Diagnosis not present

## 2022-08-14 DIAGNOSIS — Z79899 Other long term (current) drug therapy: Secondary | ICD-10-CM | POA: Insufficient documentation

## 2022-08-14 DIAGNOSIS — K5900 Constipation, unspecified: Secondary | ICD-10-CM | POA: Insufficient documentation

## 2022-08-14 DIAGNOSIS — D509 Iron deficiency anemia, unspecified: Secondary | ICD-10-CM | POA: Diagnosis present

## 2022-08-14 DIAGNOSIS — G35 Multiple sclerosis: Secondary | ICD-10-CM | POA: Diagnosis not present

## 2022-08-14 DIAGNOSIS — Z87891 Personal history of nicotine dependence: Secondary | ICD-10-CM | POA: Diagnosis not present

## 2022-08-14 DIAGNOSIS — G4730 Sleep apnea, unspecified: Secondary | ICD-10-CM | POA: Diagnosis not present

## 2022-08-14 DIAGNOSIS — E559 Vitamin D deficiency, unspecified: Secondary | ICD-10-CM | POA: Insufficient documentation

## 2022-08-14 DIAGNOSIS — K760 Fatty (change of) liver, not elsewhere classified: Secondary | ICD-10-CM | POA: Diagnosis not present

## 2022-08-14 DIAGNOSIS — E785 Hyperlipidemia, unspecified: Secondary | ICD-10-CM | POA: Insufficient documentation

## 2022-08-14 DIAGNOSIS — Z7984 Long term (current) use of oral hypoglycemic drugs: Secondary | ICD-10-CM | POA: Diagnosis not present

## 2022-08-14 DIAGNOSIS — E669 Obesity, unspecified: Secondary | ICD-10-CM | POA: Diagnosis not present

## 2022-08-14 LAB — CBC WITH DIFFERENTIAL (CANCER CENTER ONLY)
Abs Immature Granulocytes: 0.02 10*3/uL (ref 0.00–0.07)
Basophils Absolute: 0.1 10*3/uL (ref 0.0–0.1)
Basophils Relative: 1 %
Eosinophils Absolute: 0.2 10*3/uL (ref 0.0–0.5)
Eosinophils Relative: 3 %
HCT: 36.8 % (ref 36.0–46.0)
Hemoglobin: 12.1 g/dL (ref 12.0–15.0)
Immature Granulocytes: 0 %
Lymphocytes Relative: 17 %
Lymphs Abs: 1.1 10*3/uL (ref 0.7–4.0)
MCH: 27.9 pg (ref 26.0–34.0)
MCHC: 32.9 g/dL (ref 30.0–36.0)
MCV: 85 fL (ref 80.0–100.0)
Monocytes Absolute: 0.6 10*3/uL (ref 0.1–1.0)
Monocytes Relative: 9 %
Neutro Abs: 4.5 10*3/uL (ref 1.7–7.7)
Neutrophils Relative %: 70 %
Platelet Count: 314 10*3/uL (ref 150–400)
RBC: 4.33 MIL/uL (ref 3.87–5.11)
RDW: 13.1 % (ref 11.5–15.5)
WBC Count: 6.5 10*3/uL (ref 4.0–10.5)
nRBC: 0 % (ref 0.0–0.2)

## 2022-08-14 LAB — CMP (CANCER CENTER ONLY)
ALT: 15 U/L (ref 0–44)
AST: 17 U/L (ref 15–41)
Albumin: 4.4 g/dL (ref 3.5–5.0)
Alkaline Phosphatase: 56 U/L (ref 38–126)
Anion gap: 7 (ref 5–15)
BUN: 9 mg/dL (ref 6–20)
CO2: 27 mmol/L (ref 22–32)
Calcium: 9.5 mg/dL (ref 8.9–10.3)
Chloride: 104 mmol/L (ref 98–111)
Creatinine: 0.71 mg/dL (ref 0.44–1.00)
GFR, Estimated: >60 mL/min (ref >60)
Glucose, Bld: 92 mg/dL (ref 70–99)
Potassium: 3.8 mmol/L (ref 3.5–5.1)
Sodium: 138 mmol/L (ref 135–145)
Total Bilirubin: 0.3 mg/dL (ref 0.3–1.2)
Total Protein: 7.2 g/dL (ref 6.5–8.1)

## 2022-08-14 LAB — VITAMIN B12: Vitamin B-12: 352 pg/mL (ref 180–914)

## 2022-08-14 LAB — SAMPLE TO BLOOD BANK

## 2022-08-14 LAB — IRON AND IRON BINDING CAPACITY (CC-WL,HP ONLY)
Iron: 31 ug/dL (ref 28–170)
Saturation Ratios: 7 % — ABNORMAL LOW (ref 10.4–31.8)
TIBC: 424 ug/dL (ref 250–450)
UIBC: 393 ug/dL (ref 148–442)

## 2022-08-14 LAB — FOLATE: Folate: 31.1 ng/mL (ref >5.9)

## 2022-08-14 LAB — FERRITIN: Ferritin: 7 ng/mL — ABNORMAL LOW (ref 11–307)

## 2022-08-14 NOTE — Patient Instructions (Signed)
There are several options regarding iron supplements.  I would recommend trying to see if you can find a slow release iron.  These are often labeled as "Slow Fe".  These are typically more tolerable on the stomach.  We would recommend taking iron supplement every other day with vitamin C.  They also have lower dose iron supplements.  Ideally we would have you take a 65 mg iron but any mount of iron over your multivitamin will be better than none.  Other options include taking stool softeners with iron to help prevent constipation if you have constipation from your prior iron supplement.  You can also try liquid iron.

## 2022-08-15 ENCOUNTER — Encounter: Payer: Self-pay | Admitting: Physician Assistant

## 2022-08-16 ENCOUNTER — Other Ambulatory Visit: Payer: Self-pay | Admitting: Physician Assistant

## 2022-08-16 ENCOUNTER — Other Ambulatory Visit: Payer: Self-pay

## 2022-08-17 ENCOUNTER — Telehealth: Payer: Self-pay | Admitting: Pharmacy Technician

## 2022-08-17 NOTE — Telephone Encounter (Signed)
Dr. Theodis Blaze, Fyi note:  Patient will be scheduled as soon as possible.  Auth Submission: NO AUTH NEEDED Site of care: Site of care: [facility] Payer: [employer] Medication & CPT/J Code(s) submitted: Venofer (Iron Sucrose) J1756 Route of submission (phone, fax, portal):  Phone # Fax # Auth type: Buy/Bill PB Units/visits requested: 3 Reference number:  Approval from: 08/17/22 to 12/17/22

## 2022-08-20 ENCOUNTER — Encounter (INDEPENDENT_AMBULATORY_CARE_PROVIDER_SITE_OTHER): Payer: Self-pay | Admitting: Family Medicine

## 2022-08-20 ENCOUNTER — Other Ambulatory Visit (INDEPENDENT_AMBULATORY_CARE_PROVIDER_SITE_OTHER): Payer: Self-pay | Admitting: Family Medicine

## 2022-08-20 DIAGNOSIS — R632 Polyphagia: Secondary | ICD-10-CM

## 2022-08-20 NOTE — Telephone Encounter (Signed)
Please advise 

## 2022-08-21 ENCOUNTER — Ambulatory Visit (INDEPENDENT_AMBULATORY_CARE_PROVIDER_SITE_OTHER): Payer: 59

## 2022-08-21 VITALS — BP 124/84 | HR 87 | Temp 98.0°F | Resp 18 | Ht 66.0 in | Wt 219.4 lb

## 2022-08-21 DIAGNOSIS — D509 Iron deficiency anemia, unspecified: Secondary | ICD-10-CM | POA: Diagnosis not present

## 2022-08-21 MED ORDER — METFORMIN HCL 500 MG PO TABS: 500.0000 mg | ORAL_TABLET | Freq: Every day | ORAL | 0 refills | Status: DC

## 2022-08-21 MED ORDER — ACETAMINOPHEN 325 MG PO TABS
650.0000 mg | ORAL_TABLET | Freq: Once | ORAL | Status: AC
  Administered 2022-08-21: 650 mg via ORAL
  Filled 2022-08-21: qty 2

## 2022-08-21 MED ORDER — SODIUM CHLORIDE 0.9 % IV SOLN
300.0000 mg | Freq: Once | INTRAVENOUS | Status: AC
  Administered 2022-08-21: 300 mg via INTRAVENOUS
  Filled 2022-08-21: qty 15

## 2022-08-21 MED ORDER — DIPHENHYDRAMINE HCL 25 MG PO CAPS
25.0000 mg | ORAL_CAPSULE | Freq: Once | ORAL | Status: AC
  Administered 2022-08-21: 25 mg via ORAL
  Filled 2022-08-21: qty 1

## 2022-08-21 MED ORDER — METFORMIN HCL 500 MG PO TABS
500.0000 mg | ORAL_TABLET | Freq: Every day | ORAL | 0 refills | Status: DC
Start: 2022-08-21 — End: 2022-11-06

## 2022-08-21 NOTE — Addendum Note (Signed)
Addended by: Theola Sequin on: 08/21/2022 01:16 PM   Modules accepted: Orders

## 2022-08-21 NOTE — Telephone Encounter (Signed)
Ok for 90 days

## 2022-08-21 NOTE — Patient Instructions (Signed)
Iron Sucrose Injection What is this medication? IRON SUCROSE (EYE ern SOO krose) treats low levels of iron (iron deficiency anemia) in people with kidney disease. Iron is a mineral that plays an important role in making red blood cells, which carry oxygen from your lungs to the rest of your body. This medicine may be used for other purposes; ask your health care provider or pharmacist if you have questions. COMMON BRAND NAME(S): Venofer What should I tell my care team before I take this medication? They need to know if you have any of these conditions: Anemia not caused by low iron levels Heart disease High levels of iron in the blood Kidney disease Liver disease An unusual or allergic reaction to iron, other medications, foods, dyes, or preservatives Pregnant or trying to get pregnant Breastfeeding How should I use this medication? This medication is for infusion into a vein. It is given in a hospital or clinic setting. Talk to your care team about the use of this medication in children. While this medication may be prescribed for children as young as 2 years for selected conditions, precautions do apply. Overdosage: If you think you have taken too much of this medicine contact a poison control center or emergency room at once. NOTE: This medicine is only for you. Do not share this medicine with others. What if I miss a dose? Keep appointments for follow-up doses. It is important not to miss your dose. Call your care team if you are unable to keep an appointment. What may interact with this medication? Do not take this medication with any of the following: Deferoxamine Dimercaprol Other iron products This medication may also interact with the following: Chloramphenicol Deferasirox This list may not describe all possible interactions. Give your health care provider a list of all the medicines, herbs, non-prescription drugs, or dietary supplements you use. Also tell them if you smoke,  drink alcohol, or use illegal drugs. Some items may interact with your medicine. What should I watch for while using this medication? Visit your care team regularly. Tell your care team if your symptoms do not start to get better or if they get worse. You may need blood work done while you are taking this medication. You may need to follow a special diet. Talk to your care team. Foods that contain iron include: whole grains/cereals, dried fruits, beans, or peas, leafy green vegetables, and organ meats (liver, kidney). What side effects may I notice from receiving this medication? Side effects that you should report to your care team as soon as possible: Allergic reactions--skin rash, itching, hives, swelling of the face, lips, tongue, or throat Low blood pressure--dizziness, feeling faint or lightheaded, blurry vision Shortness of breath Side effects that usually do not require medical attention (report to your care team if they continue or are bothersome): Flushing Headache Joint pain Muscle pain Nausea Pain, redness, or irritation at injection site This list may not describe all possible side effects. Call your doctor for medical advice about side effects. You may report side effects to FDA at 1-800-FDA-1088. Where should I keep my medication? This medication is given in a hospital or clinic. It will not be stored at home. NOTE: This sheet is a summary. It may not cover all possible information. If you have questions about this medicine, talk to your doctor, pharmacist, or health care provider.  2024 Elsevier/Gold Standard (2022-06-08 00:00:00)

## 2022-08-21 NOTE — Telephone Encounter (Signed)
Patient's insurance is requesting a 90 day supply, please advise.

## 2022-08-21 NOTE — Telephone Encounter (Signed)
Ok to refill x 1  

## 2022-08-21 NOTE — Addendum Note (Signed)
Addended by: Dwyane Luo on: 08/21/2022 11:11 AM   Modules accepted: Orders

## 2022-08-21 NOTE — Progress Notes (Signed)
Diagnosis: Iron Deficiency Anemia  Provider:  Chilton Greathouse MD  Procedure: IV Infusion  IV Type: Peripheral, IV Location: R Forearm  Venofer (Iron Sucrose), Dose: 300 mg  Infusion Start Time: 1405  Infusion Stop Time: 1547  Post Infusion IV Care: Observation period completed and Peripheral IV Discontinued  Discharge: Condition: Good, Destination: Home . AVS Provided  Performed by:  Garnette Czech, RN

## 2022-08-23 ENCOUNTER — Encounter: Payer: Self-pay | Admitting: Internal Medicine

## 2022-08-23 ENCOUNTER — Other Ambulatory Visit: Payer: Self-pay | Admitting: Internal Medicine

## 2022-08-23 MED ORDER — ONDANSETRON HCL 8 MG PO TABS: ORAL_TABLET | ORAL | 0 refills | Status: DC

## 2022-08-25 ENCOUNTER — Other Ambulatory Visit: Payer: Self-pay | Admitting: Neurology

## 2022-08-25 DIAGNOSIS — E559 Vitamin D deficiency, unspecified: Secondary | ICD-10-CM

## 2022-08-28 ENCOUNTER — Encounter: Payer: Self-pay | Admitting: Neurology

## 2022-09-03 ENCOUNTER — Ambulatory Visit (INDEPENDENT_AMBULATORY_CARE_PROVIDER_SITE_OTHER): Payer: 59

## 2022-09-03 VITALS — BP 133/87 | HR 99 | Temp 98.6°F | Resp 18 | Ht 66.5 in | Wt 221.0 lb

## 2022-09-03 DIAGNOSIS — D509 Iron deficiency anemia, unspecified: Secondary | ICD-10-CM | POA: Diagnosis not present

## 2022-09-03 MED ORDER — DIPHENHYDRAMINE HCL 25 MG PO CAPS
25.0000 mg | ORAL_CAPSULE | Freq: Once | ORAL | Status: AC
  Administered 2022-09-03: 25 mg via ORAL
  Filled 2022-09-03: qty 1

## 2022-09-03 MED ORDER — ACETAMINOPHEN 325 MG PO TABS
650.0000 mg | ORAL_TABLET | Freq: Once | ORAL | Status: AC
  Administered 2022-09-03: 650 mg via ORAL
  Filled 2022-09-03: qty 2

## 2022-09-03 MED ORDER — SODIUM CHLORIDE 0.9 % IV SOLN
300.0000 mg | Freq: Once | INTRAVENOUS | Status: AC
  Administered 2022-09-03: 300 mg via INTRAVENOUS
  Filled 2022-09-03: qty 15

## 2022-09-03 NOTE — Progress Notes (Signed)
Diagnosis: Iron Deficiency Anemia  Provider:  Chilton Greathouse MD  Procedure: IV Infusion  IV Type: Peripheral, IV Location: R Forearm  Venofer (Iron Sucrose), Dose: 300 mg  Infusion Start Time: 1357  Infusion Stop Time: 1540  Post Infusion IV Care: Patient declined observation and Peripheral IV Discontinued  Discharge: Condition: Good, Destination: Home . AVS Declined  Performed by:  Garnette Czech, RN

## 2022-09-05 ENCOUNTER — Ambulatory Visit (INDEPENDENT_AMBULATORY_CARE_PROVIDER_SITE_OTHER): Payer: 59 | Admitting: Family Medicine

## 2022-09-05 ENCOUNTER — Encounter (INDEPENDENT_AMBULATORY_CARE_PROVIDER_SITE_OTHER): Payer: Self-pay | Admitting: Family Medicine

## 2022-09-05 VITALS — BP 132/81 | HR 87 | Temp 97.7°F | Ht 66.0 in | Wt 218.0 lb

## 2022-09-05 DIAGNOSIS — Z6835 Body mass index (BMI) 35.0-35.9, adult: Secondary | ICD-10-CM | POA: Diagnosis not present

## 2022-09-05 DIAGNOSIS — E559 Vitamin D deficiency, unspecified: Secondary | ICD-10-CM

## 2022-09-05 DIAGNOSIS — E669 Obesity, unspecified: Secondary | ICD-10-CM

## 2022-09-05 DIAGNOSIS — E88819 Insulin resistance, unspecified: Secondary | ICD-10-CM

## 2022-09-06 LAB — VITAMIN D 25 HYDROXY (VIT D DEFICIENCY, FRACTURES): Vit D, 25-Hydroxy: 52.1 ng/mL (ref 30.0–100.0)

## 2022-09-06 LAB — HEMOGLOBIN A1C
Est. average glucose Bld gHb Est-mCnc: 108 mg/dL
Hgb A1c MFr Bld: 5.4 % (ref 4.8–5.6)

## 2022-09-06 NOTE — Progress Notes (Signed)
Chief Complaint:   OBESITY Charlene [name] is here to discuss her progress with her obesity treatment plan along with follow-up of her obesity related diagnoses. Charlene [name] is on keeping a food journal and adhering to recommended goals of 1800 calories and 120+ grams of protein and states she is following her eating plan approximately 0% of the time. Charlene [name] states she is doing 0 minutes 0 times per week.  Today's visit was #: 19 Starting weight: 250 lbs Starting date: 03/29/2021 Today's weight: 218 lbs Today's date: 09/05/2022 Total lbs lost to date: 32 Total lbs lost since last in-office visit: 0  Interim History: Patient has been on vacation and did some celebration eating.  She is working on getting back on track and she feels once her kids are in school, meal planning will be easier.  Subjective:   1. Vitamin D deficiency Patient has been out of vitamin D, and her last level was at goal.  She is due for labs.  2. Insulin resistance Patient is working on decreasing simple carbohydrates.  She is due to have her A1c checked.  She notes polyphagia.  Assessment/Plan:   1. Vitamin D deficiency We will check labs today.  Patient will continue prescription vitamin D 50,000 IU once weekly, and we will follow-up at her next visit.  - VITAMIN D 25 Hydroxy (Vit-D Deficiency, Fractures)  2. Insulin resistance We will check labs today.  Patient will continue with her diet and exercise, and we will follow-up at her next visit.  - Hemoglobin A1c  3. BMI 35.0-35.9,adult  4. Obesity, Beginning BMI 40.35 Charlene [name] is currently in the action stage of change. As such, her goal is to continue with weight loss efforts. She has agreed to keeping a food journal and adhering to recommended goals of 1800 calories and 120 grams of protein daily.   Patient was shown how to use Chat GPT to help with meal planning and recipes.  Behavioral modification strategies: increasing lean protein intake and meal planning and  cooking strategies.  Rashita has agreed to follow-up with our clinic in 4 weeks. She was informed of the importance of frequent follow-up visits to maximize her success with intensive lifestyle modifications for her multiple health conditions.   Objective:   Blood pressure 132/81, pulse 87, temperature 97.7 F (36.5 C), height 5\' 6"  (1.676 m), weight 218 lb (98.9 kg), SpO2 99%. Body mass index is 35.19 kg/m.  Lab Results  Component Value Date   CREATININE 0.71 08/14/2022   BUN 9 08/14/2022   NA 138 08/14/2022   K 3.8 08/14/2022   CL 104 08/14/2022   CO2 27 08/14/2022   Lab Results  Component Value Date   ALT 15 08/14/2022   AST 17 08/14/2022   ALKPHOS 56 08/14/2022   BILITOT 0.3 08/14/2022   Lab Results  Component Value Date   HGBA1C 5.4 09/05/2022   HGBA1C 5.1 11/08/2021   HGBA1C 5.4 03/29/2021   HGBA1C 4.9 05/05/2018   Lab Results  Component Value Date   INSULIN 15.6 11/08/2021   INSULIN 15.6 03/29/2021   Lab Results  Component Value Date   TSH 1.480 03/29/2021   Lab Results  Component Value Date   CHOL 144 11/08/2021   HDL 48 11/08/2021   LDLCALC 77 11/08/2021   LDLDIRECT 236.0 12/22/2019   TRIG 106 11/08/2021   CHOLHDL 3 06/17/2020   Lab Results  Component Value Date   VD25OH 52.1 09/05/2022   VD25OH 68.7 03/28/2022   VD25OH  68.0 11/08/2021   Lab Results  Component Value Date   WBC 6.5 08/14/2022   HGB 12.1 08/14/2022   HCT 36.8 08/14/2022   MCV 85.0 08/14/2022   PLT 314 08/14/2022   Lab Results  Component Value Date   IRON 31 08/14/2022   TIBC 424 08/14/2022   FERRITIN 7 (L) 08/14/2022   Attestation Statements:   Reviewed by clinician on day of visit: allergies, medications, problem list, medical history, surgical history, family history, social history, and previous encounter notes.   I, Burt Knack, am acting as transcriptionist for Quillian Quince, MD.  I have reviewed the above documentation for accuracy and completeness, and I  agree with the above. -  Quillian Quince, MD

## 2022-09-11 ENCOUNTER — Telehealth: Payer: Self-pay | Admitting: Internal Medicine

## 2022-09-11 ENCOUNTER — Ambulatory Visit (INDEPENDENT_AMBULATORY_CARE_PROVIDER_SITE_OTHER): Payer: 59

## 2022-09-11 VITALS — BP 127/79 | HR 84 | Temp 98.1°F | Resp 16 | Ht 66.0 in | Wt 221.2 lb

## 2022-09-11 DIAGNOSIS — D509 Iron deficiency anemia, unspecified: Secondary | ICD-10-CM

## 2022-09-11 MED ORDER — DIPHENHYDRAMINE HCL 25 MG PO CAPS
25.0000 mg | ORAL_CAPSULE | Freq: Once | ORAL | Status: AC
  Administered 2022-09-11: 25 mg via ORAL
  Filled 2022-09-11: qty 1

## 2022-09-11 MED ORDER — SODIUM CHLORIDE 0.9 % IV SOLN
300.0000 mg | Freq: Once | INTRAVENOUS | Status: AC
  Administered 2022-09-11: 300 mg via INTRAVENOUS
  Filled 2022-09-11: qty 15

## 2022-09-11 MED ORDER — ACETAMINOPHEN 325 MG PO TABS
650.0000 mg | ORAL_TABLET | Freq: Once | ORAL | Status: AC
  Administered 2022-09-11: 650 mg via ORAL
  Filled 2022-09-11: qty 2

## 2022-09-11 NOTE — Progress Notes (Signed)
Diagnosis: Iron Deficiency Anemia  Provider:  Chilton Greathouse MD  Procedure: IV Infusion  IV Type: Peripheral, IV Location: R Forearm  Venofer (Iron Sucrose), Dose: 300 mg  Infusion Start Time: 1035  Infusion Stop Time: 1315  Post Infusion IV Care: Pt waited 5 minutes of observation time. PIV removed.   Discharge: Condition: Good, Destination: Home . AVS Declined  Performed by:  Loney Hering, LPN

## 2022-09-13 ENCOUNTER — Encounter: Payer: Self-pay | Admitting: Gastroenterology

## 2022-09-13 NOTE — Telephone Encounter (Signed)
It is fine to cancel hemorrhoid band ligation given her symptoms have resolved after the first band ligation.  Please advise patient to call if she has any symptoms.

## 2022-09-18 ENCOUNTER — Encounter (INDEPENDENT_AMBULATORY_CARE_PROVIDER_SITE_OTHER): Payer: 59

## 2022-10-03 ENCOUNTER — Encounter: Payer: Self-pay | Admitting: Pulmonary Disease

## 2022-10-03 ENCOUNTER — Ambulatory Visit (INDEPENDENT_AMBULATORY_CARE_PROVIDER_SITE_OTHER): Payer: 59 | Admitting: Pulmonary Disease

## 2022-10-03 VITALS — BP 122/84 | HR 81 | Ht 66.0 in | Wt 224.6 lb

## 2022-10-03 DIAGNOSIS — G4733 Obstructive sleep apnea (adult) (pediatric): Secondary | ICD-10-CM | POA: Diagnosis not present

## 2022-10-03 NOTE — Progress Notes (Unsigned)
Charlene [name]    [id number]    28-Jan-1980  Primary Care Physician:Tabori, Helane Rima, MD  Referring Physician: [name], MD [address],  [place] [address]  Chief complaint:   Follow-up for moderate obstructive sleep apnea  HPI:  In for follow-up today Has not been using her CPAP  Has had some health issues and stopped using CPAP  She has made some significant lifestyle changes, lost about 40 pounds, stopped drinking Has gained about 10 pounds back  Since the weight increase she has noticed some gas pain, mild opening up when she is sleeping Feels she is snoring again she is in to discuss possibility of further evaluation and management of sleep disordered breathing  Historically Usually goes to bed between 930 and 10:30 PM 15 to 20 minutes to fall asleep Multiple awakenings final wake up time usually about 10 AM Usually wakes up about 6 AM to get at kids ready for school, will go back to laying in bed and finally try and get up by 10 Will usually use the snooze button multiple times before finally getting up  Very easy for her to take a daytime nap  Admits to dryness of mouth in the morning Occasional sore throat Occasional night sweats Sometimes wake up wakes up with a headache  Dad snored Reformed smoker   Outpatient Encounter Medications as of 10/03/2022  Medication Sig   ALPRAZolam (XANAX) 1 MG tablet Take 2 pills about 30 min prior to the MRI. Can take additional tablet at the time of test if needed.Can cause drowsiness. Must have driver to and from the test   Armodafinil 200 MG TABS Take 1 tablet by mouth daily.   clobetasol cream (TEMOVATE) 0.05 % Apply 1 Application topically 2 (two) times daily.   lamoTRIgine (LAMICTAL) 200 MG tablet Take 200 mg by mouth daily.   metFORMIN (GLUCOPHAGE) 500 MG tablet Take 1 tablet (500 mg total) by mouth daily.   miconazole (MICOTIN) 2 % cream Apply 1 Application topically 2 (two) times  daily.   Multiple Vitamin (MULTIVITAMIN) tablet Take 1 tablet by mouth daily.   omeprazole (PRILOSEC) 40 MG capsule Take 1 capsule (40 mg total) by mouth daily.   ondansetron (ZOFRAN) 8 MG tablet 1 tablet every 8 hours if absolutely needed for nausea.   Vitamin D, Ergocalciferol, (DRISDOL) 1.25 MG (50000 UNIT) CAPS capsule Take 1 capsule (50,000 Units total) by mouth every 7 (seven) days.   VTAMA 1 % CREA Apply 1 Application topically daily.   zolmitriptan (ZOMIG) 5 MG tablet Take 1 p.o. as needed for migraine.  May repeat in 2 hours.  No more than 2 pills in a day or 4 pills in any week.   [DISCONTINUED] acitretin (SORIATANE) 10 MG capsule Take 10 mg by mouth daily before breakfast. (Patient not taking: Reported on 07/17/2022)   [DISCONTINUED] cyclobenzaprine (FLEXERIL) 10 MG tablet Take 1 tablet (10 mg total) by mouth 2 (two) times daily as needed for up to 10 doses for muscle spasms.   [DISCONTINUED] hydrocortisone (ANUSOL-HC) 25 MG suppository Place 1 suppository (25 mg total) rectally at bedtime as needed for hemorrhoids or anal itching (for 10 days). (Patient not taking: Reported on 07/17/2022)   [DISCONTINUED] lamoTRIgine (LAMICTAL) 150 MG tablet Take 200 mg by mouth daily.   [DISCONTINUED] linaclotide (LINZESS) 145 MCG CAPS capsule Take 1 capsule (145 mcg total) by mouth daily before breakfast. (Patient not taking: Reported on 08/14/2022)   [  DISCONTINUED] metoCLOPramide (REGLAN) 10 MG tablet Take 1 tablet (10 mg total) by mouth every 8 (eight) hours as needed for up to 10 doses for nausea.   [DISCONTINUED] naltrexone (DEPADE) 50 MG tablet Take 50 mg by mouth daily.   [DISCONTINUED] VYVANSE 60 MG capsule Take 60 mg by mouth every morning.   No facility-administered encounter medications on file as of 10/03/2022.    Allergies as of 10/03/2022 - Review Complete 10/03/2022  Allergen Reaction Noted   Ciprofloxacin Other (See Comments) 02/11/2016   Prednisone Other (See Comments) 02/11/2016    African mango [irvingia gabonensis] Other (See Comments) 03/29/2021   Atorvastatin Other (See Comments) 06/06/2022   Rosuvastatin Other (See Comments) 06/06/2022    Past Medical History:  Diagnosis Date   Alcoholism (HCC)    Anxiety    Anxiety and depression    Bipolar 2 disorder (HCC)    Borderline personality disorder (HCC)    Chest pain    Chronic fatigue syndrome    Claustrophobia    on meds   Constipation    Depression    on meds   Elevated blood sugar 06/08/2014   Elevated LFTs    Fatigue    Fatty liver 10/2014   GERD (gastroesophageal reflux disease)    diet related   Heart murmur    Hyperlipidemia    on meds   Infertility associated with anovulation    Joint pain    Low grade squamous intraepithelial lesion (LGSIL) on Pap smear 10/30/2011   Major depressive disorder    Migraines    MS (multiple sclerosis) (HCC)    Neuromuscular disorder (HCC)    Obesity    Obesity in pregnancy, antepartum    PCOS (polycystic ovarian syndrome)    Personal history of pre-term labor    Prediabetes    Pregnancy induced hypertension    pregnancy related- hx of   Psoriasis    Sleep apnea    SOB (shortness of breath)    Vitamin D deficiency     Past Surgical History:  Procedure Laterality Date   COLONOSCOPY  10/17/2020   DILATION AND CURETTAGE OF UTERUS  08/15/2008   DILATION AND EVACUATION  12/29/2011   Procedure: DILATATION AND EVACUATION;  Surgeon: Catalina Antigua, MD;  Location: [facility];  Service: Gynecology;;  Dr, Emelda Fear transferred case to Dr. Jolayne Panther   LAPAROSCOPIC GASTRIC SLEEVE RESECTION N/A 12/07/2014   Procedure: LAPAROSCOPIC GASTRIC SLEEVE RESECTION;  Surgeon: Ovidio Kin, MD;  Location: [facility];  Service: General;  Laterality: N/A;    Family History  Problem Relation Age of Onset   Hypertension Mother    Hyperlipidemia Mother    Mental illness Mother    Dementia Mother        early onset   Depression Mother    Anxiety disorder Mother    Drug abuse  Mother    Eating disorder Mother    Alcoholism Father    Heart disease Father    Alcoholism Maternal Grandmother    Mental illness Maternal Grandmother    Esophageal cancer Maternal Grandmother    Heart attack Maternal Grandfather    Colon cancer Neg Hx    Stomach cancer Neg Hx    Colon polyps Neg Hx    Rectal cancer Neg Hx     Social History   Socioeconomic History   Marital status: Married    Spouse name: Pennie Rushing   Number of children: 3   Years of education: 14   Highest education level: Not on  file  Occupational History   Occupation: Print production planner   Occupation: stay at home mom, part time daycare  Tobacco Use   Smoking status: Former    Current packs/day: 0.00    Average packs/day: 1 pack/day for 15.0 years (15.0 ttl pk-yrs)    Types: Cigarettes    Start date: 07/02/1995    Quit date: 07/02/2010    Years since quitting: 12.2   Smokeless tobacco: Never  Vaping Use   Vaping status: Never Used  Substance and Sexual Activity   Alcohol use: Not Currently    Comment: recovery alcoholic   Drug use: No   Sexual activity: Yes    Partners: Male    Birth control/protection: None    Comment: vasectomy  Other Topics Concern   Not on file  Social History Narrative   Work or School: homemaker      Home Situation: lives with husband, 2 twins and 1 boy      Spiritual Beliefs: none      Lifestyle: no regular exercise, poor diet   Caffeine use: diet soda daily      Social Determinants of Health   Financial Resource Strain: Not on file  Food Insecurity: Not on file  Transportation Needs: Not on file  Physical Activity: Not on file  Stress: Not on file  Social Connections: Unknown (04/12/2022)   Received from [facility]   Social Network    Social Network: Not on file  Intimate Partner Violence: Unknown (04/12/2022)   Received from [facility], [facility]   HITS    Physically Hurt: Not on file    Insult or Talk Down To: Not on file    Threaten  Physical Harm: Not on file    Scream or Curse: Not on file    Review of Systems  Constitutional:  Positive for fatigue.  Respiratory:  Positive for apnea.   Psychiatric/Behavioral:  Positive for sleep disturbance.     Vitals:   10/03/22 1521  BP: 122/84  Pulse: 81  SpO2: 99%     Physical Exam Constitutional:      Appearance: She is obese.  HENT:     Mouth/Throat:     Mouth: Mucous membranes are moist.     Comments: Moist oral mucosa, crowded oropharynx, Mallampati 4 Eyes:     Pupils: Pupils are equal, round, and reactive to light.  Cardiovascular:     Rate and Rhythm: Normal rate and regular rhythm.     Heart sounds: No murmur heard.    No friction rub.  Pulmonary:     Effort: No respiratory distress.     Breath sounds: No stridor. No wheezing or rhonchi.  Musculoskeletal:     Cervical back: No rigidity or tenderness.  Neurological:     Mental Status: She is alert.  Psychiatric:        Mood and Affect: Mood normal.       11/10/2020   10:00 AM  Results of the Epworth flowsheet  Sitting and reading 2  Watching TV 2  Sitting, inactive in a public place (e.g. a theatre or a meeting) 0  As a passenger in a car for an hour without a break 0  Lying down to rest in the afternoon when circumstances permit 1  Sitting and talking to someone 1  Sitting quietly after a lunch without alcohol 0  In a car, while stopped for a few minutes in traffic 1  Total score 7  Data Reviewed: Sleep study with moderate obstructive sleep apnea with an AHI of 16  Previous download from previous visit did show that CPAP was effective will residual AHI 1.6  Assessment:   Moderate obstructive sleep apnea -Currently not on CPAP therapy -Still has machine -Needs supplies  Class III obesity -She continues to work on weight loss efforts  Daytime sleepiness -Likely related to untreated sleep disordered breathing -  Daytime  fatigue -Improving   Plan/Recommendations: Encouraged to reinitiate CPAP use  DME referral for CPAP supplies, new mask  Tolerated auto CPAP previously  Encouraged to give [place] a call with any significant concerns  Follow-up in about 6 to 8 weeks  Importance of treating sleep disordered breathing was discussed   Virl Diamond MD [facility] 10/03/2022, 3:23 PM  CC: [name], MD

## 2022-10-03 NOTE — Patient Instructions (Signed)
DME referral to adapt -CPAP supplies -New mask  When she get a new mask that trying to use the CPAP on a nightly basis  I will see to give it about 4 to 6 weeks before making a decision if you feel you still will not tolerated  Continue focusing on weight loss efforts  Follow-up in 6 to 8 weeks  Call us with significant concerns

## 2022-10-09 ENCOUNTER — Encounter (INDEPENDENT_AMBULATORY_CARE_PROVIDER_SITE_OTHER): Payer: Self-pay | Admitting: Family Medicine

## 2022-10-09 ENCOUNTER — Ambulatory Visit (INDEPENDENT_AMBULATORY_CARE_PROVIDER_SITE_OTHER): Payer: 59 | Admitting: Family Medicine

## 2022-10-09 VITALS — BP 124/85 | HR 73 | Temp 98.1°F | Ht 66.0 in | Wt 220.0 lb

## 2022-10-09 DIAGNOSIS — Z6835 Body mass index (BMI) 35.0-35.9, adult: Secondary | ICD-10-CM

## 2022-10-09 DIAGNOSIS — E88819 Insulin resistance, unspecified: Secondary | ICD-10-CM

## 2022-10-09 DIAGNOSIS — E559 Vitamin D deficiency, unspecified: Secondary | ICD-10-CM | POA: Diagnosis not present

## 2022-10-09 DIAGNOSIS — E785 Hyperlipidemia, unspecified: Secondary | ICD-10-CM | POA: Diagnosis not present

## 2022-10-09 DIAGNOSIS — R5383 Other fatigue: Secondary | ICD-10-CM

## 2022-10-09 DIAGNOSIS — G4733 Obstructive sleep apnea (adult) (pediatric): Secondary | ICD-10-CM | POA: Diagnosis not present

## 2022-10-09 DIAGNOSIS — E669 Obesity, unspecified: Secondary | ICD-10-CM

## 2022-10-10 LAB — LIPID PANEL WITH LDL/HDL RATIO
Cholesterol, Total: 275 mg/dL — ABNORMAL HIGH (ref 100–199)
HDL: 43 mg/dL (ref >39)
LDL Chol Calc (NIH): 166 mg/dL — ABNORMAL HIGH (ref 0–99)
LDL/HDL Ratio: 3.9 ratio — ABNORMAL HIGH (ref 0.0–3.2)
Triglycerides: 345 mg/dL — ABNORMAL HIGH (ref 0–149)
VLDL Cholesterol Cal: 66 mg/dL — ABNORMAL HIGH (ref 5–40)

## 2022-10-10 LAB — CMP14+EGFR
ALT: 17 IU/L (ref 0–32)
AST: 20 IU/L (ref 0–40)
Albumin: 4.6 g/dL (ref 3.9–4.9)
Alkaline Phosphatase: 69 IU/L (ref 44–121)
BUN/Creatinine Ratio: 19 (ref 9–23)
BUN: 11 mg/dL (ref 6–24)
Bilirubin Total: <0.2 mg/dL (ref 0.0–1.2)
CO2: 24 mmol/L (ref 20–29)
Calcium: 9.4 mg/dL (ref 8.7–10.2)
Chloride: 101 mmol/L (ref 96–106)
Creatinine, Ser: 0.59 mg/dL (ref 0.57–1.00)
Globulin, Total: 2.2 g/dL (ref 1.5–4.5)
Glucose: 79 mg/dL (ref 70–99)
Potassium: 4 mmol/L (ref 3.5–5.2)
Sodium: 139 mmol/L (ref 134–144)
Total Protein: 6.8 g/dL (ref 6.0–8.5)
eGFR: 115 mL/min/{1.73_m2} (ref >59)

## 2022-10-10 LAB — CBC WITH DIFFERENTIAL/PLATELET
Basophils Absolute: 0.1 10*3/uL (ref 0.0–0.2)
Basos: 1 %
EOS (ABSOLUTE): 0.2 10*3/uL (ref 0.0–0.4)
Eos: 3 %
Hematocrit: 44.2 % (ref 34.0–46.6)
Hemoglobin: 13.9 g/dL (ref 11.1–15.9)
Immature Grans (Abs): 0 10*3/uL (ref 0.0–0.1)
Immature Granulocytes: 0 %
Lymphocytes Absolute: 1.1 10*3/uL (ref 0.7–3.1)
Lymphs: 18 %
MCH: 28.7 pg (ref 26.6–33.0)
MCHC: 31.4 g/dL — ABNORMAL LOW (ref 31.5–35.7)
MCV: 91 fL (ref 79–97)
Monocytes Absolute: 0.5 10*3/uL (ref 0.1–0.9)
Monocytes: 9 %
Neutrophils Absolute: 3.9 10*3/uL (ref 1.4–7.0)
Neutrophils: 69 %
Platelets: 324 10*3/uL (ref 150–450)
RBC: 4.85 x10E6/uL (ref 3.77–5.28)
RDW: 16.9 % — ABNORMAL HIGH (ref 11.7–15.4)
WBC: 5.7 10*3/uL (ref 3.4–10.8)

## 2022-10-10 LAB — MAGNESIUM: Magnesium: 1.9 mg/dL (ref 1.6–2.3)

## 2022-10-10 LAB — IRON AND TIBC
Iron Saturation: 33 % (ref 15–55)
Iron: 108 ug/dL (ref 27–159)
Total Iron Binding Capacity: 331 ug/dL (ref 250–450)
UIBC: 223 ug/dL (ref 131–425)

## 2022-10-10 LAB — HEMOGLOBIN A1C
Est. average glucose Bld gHb Est-mCnc: 111 mg/dL
Hgb A1c MFr Bld: 5.5 % (ref 4.8–5.6)

## 2022-10-10 LAB — FOLATE: Folate: 8.6 ng/mL (ref >3.0)

## 2022-10-10 LAB — PHOSPHORUS: Phosphorus: 3 mg/dL (ref 3.0–4.3)

## 2022-10-10 LAB — VITAMIN B12: Vitamin B-12: 447 pg/mL (ref 232–1245)

## 2022-10-10 LAB — TSH: TSH: 0.744 u[IU]/mL (ref 0.450–4.500)

## 2022-10-10 LAB — INSULIN, RANDOM: INSULIN: 9.2 u[IU]/mL (ref 2.6–24.9)

## 2022-10-10 LAB — FERRITIN: Ferritin: 112 ng/mL (ref 15–150)

## 2022-10-11 ENCOUNTER — Ambulatory Visit (INDEPENDENT_AMBULATORY_CARE_PROVIDER_SITE_OTHER): Payer: 59 | Admitting: Neurology

## 2022-10-11 ENCOUNTER — Other Ambulatory Visit (INDEPENDENT_AMBULATORY_CARE_PROVIDER_SITE_OTHER): Payer: Self-pay | Admitting: Family Medicine

## 2022-10-11 ENCOUNTER — Encounter (INDEPENDENT_AMBULATORY_CARE_PROVIDER_SITE_OTHER): Payer: Self-pay | Admitting: Family Medicine

## 2022-10-11 ENCOUNTER — Encounter: Payer: Self-pay | Admitting: Neurology

## 2022-10-11 VITALS — BP 124/80 | HR 90 | Ht 66.0 in | Wt 225.5 lb

## 2022-10-11 DIAGNOSIS — G35 Multiple sclerosis: Secondary | ICD-10-CM

## 2022-10-11 DIAGNOSIS — E559 Vitamin D deficiency, unspecified: Secondary | ICD-10-CM | POA: Diagnosis not present

## 2022-10-11 DIAGNOSIS — R269 Unspecified abnormalities of gait and mobility: Secondary | ICD-10-CM | POA: Diagnosis not present

## 2022-10-11 DIAGNOSIS — F319 Bipolar disorder, unspecified: Secondary | ICD-10-CM

## 2022-10-11 DIAGNOSIS — Z79899 Other long term (current) drug therapy: Secondary | ICD-10-CM

## 2022-10-11 DIAGNOSIS — G43809 Other migraine, not intractable, without status migrainosus: Secondary | ICD-10-CM

## 2022-10-11 DIAGNOSIS — L400 Psoriasis vulgaris: Secondary | ICD-10-CM

## 2022-10-11 MED ORDER — VITAMIN D (ERGOCALCIFEROL) 1.25 MG (50000 UNIT) PO CAPS: 50000.0000 [IU] | ORAL_CAPSULE | ORAL | 3 refills | Status: DC

## 2022-10-11 NOTE — Progress Notes (Signed)
Chief Complaint:   OBESITY Charlene [name] is here to discuss her progress with her obesity treatment plan along with follow-up of her obesity related diagnoses. [name] is on keeping a food journal and adhering to recommended goals of 1800 calories and 120 grams of protein and states she is following her eating plan approximately 0% of the time. [name] states she is doing 0 minutes 0 times per week.  Today's visit was #: 20 Starting weight: 250 lbs Starting date: 03/29/2021 Today's weight: 220 lbs Today's date: 10/09/2022 Total lbs lost to date: 30 Total lbs lost since last in-office visit: 0  Interim History: Patient has been struggling with health issues and she has not been able to concentrate on her weight loss recently.  Subjective:   1. OSA (obstructive sleep apnea) Patient has stopped using CPAP after weight loss and with decreased snoring.  She notes unrelenting fatigue and it is likely her obstructive sleep apnea has worsened with weight gain.  2. Vitamin D deficiency Patient is on vitamin D prescription, and she denies nausea, vomiting, or muscle weakness.  She is due for labs.  3. Insulin resistance Patient is on metformin, and she is working on her diet.  She is due for labs.  4. Hyperlipidemia, unspecified hyperlipidemia type Patient has been off statin for approximately 6 months.  She is working on decreasing cholesterol in her diet.  She denies chest pain.  5. Other fatigue Patient notes increased fatigue recently, and she wonders if this could be due to thyroid or anemia or anything else.  Assessment/Plan:   1. OSA (obstructive sleep apnea) Patient is to call the sleep equipment vendors and get a new mask.  Strategies to change to a fall face mask were discussed.  2. Vitamin D deficiency We will check labs today, and she will continue prescription Vitamin D 50,000 IU once every 7 days. We will follow-up at patient's next visit in 4 weeks.  3. Insulin resistance We  will check labs today, and she will continue metformin 500 mg once daily.  We will follow-up at patient's next visit in 4 weeks.  - Insulin, random - Hemoglobin A1c  4. Hyperlipidemia, unspecified hyperlipidemia type We will check labs today, and we will follow-up at patient's next visit in 4 weeks.  - Lipid Panel With LDL/HDL Ratio  5. Other fatigue We will check labs today, and we will follow-up at patient's next visit in 4 weeks.  - Vitamin B12 - CMP14+EGFR - TSH - Folate - Magnesium - Phosphorus - Folate - Iron and TIBC - Ferritin - CBC with Differential/Platelet  6. BMI 35.0-35.9,adult  7. Obesity, Beginning BMI 40.35 Charlene [name] is currently in the action stage of change. As such, her goal is to continue with weight loss efforts. She has agreed to practicing portion control and making smarter food choices, such as increasing vegetables and decreasing simple carbohydrates.   Patient is to work on her obstructive sleep apnea and fatigue first, and then we will get back to following a more structured plan.  Behavioral modification strategies: increasing lean protein intake, decreasing simple carbohydrates, and emotional eating strategies.  Shaw has agreed to follow-up with our clinic in 4 weeks. She was informed of the importance of frequent follow-up visits to maximize her success with intensive lifestyle modifications for her multiple health conditions.   Charlene [name] was informed we would discuss her lab results at her next visit unless there is a critical issue that needs to be addressed sooner. Charlene [name]  agreed to keep her next visit at the agreed upon time to discuss these results.  Objective:   Blood pressure 124/85, pulse 73, temperature 98.1 F (36.7 C), height 5\' 6"  (1.676 m), weight 220 lb (99.8 kg), SpO2 99%. Body mass index is 35.51 kg/m.  General: Cooperative, alert, well developed, in no acute distress. HEENT: Conjunctivae and lids unremarkable. Cardiovascular: Regular  rhythm.  Lungs: Normal work of breathing. Neurologic: No focal deficits.   Lab Results  Component Value Date   CREATININE 0.59 10/09/2022   BUN 11 10/09/2022   NA 139 10/09/2022   K 4.0 10/09/2022   CL 101 10/09/2022   CO2 24 10/09/2022   Lab Results  Component Value Date   ALT 17 10/09/2022   AST 20 10/09/2022   ALKPHOS 69 10/09/2022   BILITOT <0.2 10/09/2022   Lab Results  Component Value Date   HGBA1C 5.5 10/09/2022   HGBA1C 5.4 09/05/2022   HGBA1C 5.1 11/08/2021   HGBA1C 5.4 03/29/2021   HGBA1C 4.9 05/05/2018   Lab Results  Component Value Date   INSULIN 9.2 10/09/2022   INSULIN 15.6 11/08/2021   INSULIN 15.6 03/29/2021   Lab Results  Component Value Date   TSH 0.744 10/09/2022   Lab Results  Component Value Date   CHOL 275 (H) 10/09/2022   HDL 43 10/09/2022   LDLCALC 166 (H) 10/09/2022   LDLDIRECT 236.0 12/22/2019   TRIG 345 (H) 10/09/2022   CHOLHDL 3 06/17/2020   Lab Results  Component Value Date   VD25OH 52.1 09/05/2022   VD25OH 68.7 03/28/2022   VD25OH 68.0 11/08/2021   Lab Results  Component Value Date   WBC 5.7 10/09/2022   HGB 13.9 10/09/2022   HCT 44.2 10/09/2022   MCV 91 10/09/2022   PLT 324 10/09/2022   Lab Results  Component Value Date   IRON 108 10/09/2022   TIBC 331 10/09/2022   FERRITIN 112 10/09/2022   Attestation Statements:   Reviewed by clinician on day of visit: allergies, medications, problem list, medical history, surgical history, family history, social history, and previous encounter notes.  Time spent on visit including pre-visit chart review and post-visit care and charting was 40 minutes.   I, Burt Knack, am acting as transcriptionist for Quillian Quince, MD.  I have reviewed the above documentation for accuracy and completeness, and I agree with the above. -  Quillian Quince, MD

## 2022-10-11 NOTE — Progress Notes (Signed)
[facility]  PATIENT: Charlene [name] DOB: [birth year]  REFERRING DOCTOR OR PCP: Neena Rhymes, MD (PCP); Harlen Labs MD ([facility]) SOURCE: Patient, notes from Dr. Renne Crigler, imaging and lab reports, MRI images personally reviewed.  _________________________________   HISTORICAL  CHIEF COMPLAINT:  Chief Complaint  Patient presents with   Follow-up    Pt in room 10. Here for MS follow up. Pt does have muscle weakness, headaches daily lately, fatigue.     HISTORY OF PRESENT ILLNESS:  Charlene [name] is a 42 y.o. woman with multiple sclerosis.  Update 10/11/2022: She is on Ocrevus since 2017.   We checked labs and she did not have the VZV IgG and she got the Shingrix vaccine in 2023.    She denies exacerbations but just feels weak, tired and in a mental fog daily.   She has had iron deficiency/RLS and had recent iron infusions.  Levels are good but she does not feel much better.    She has seen [facility] and has lost 40 pounds.   She is trying diet and lost the weight initially.  However, since she feels poorly, she has not exercised and is now gaining some weight.   She has OSA (Initial AHI=16) and according to a pulmonology note, the AHI on download with CPAP was 1.6).  She had been on Vyvanse (psychiatry) and recently was switched to [facility] but it is not helping much.   Vyvanse initially helped her a lot but less recently, even on 60 mg  Currently, she is doing a little neurologically.  Gait is stable with her balance mildly off and she uses the bannister on stairs and cannot use a ladder.  He feels legs a little weaker and sometimes she feels her leg is going to collapse.    She notes tingling in her wrists, ankles and feet bilaterally.   She also notes mild arm weakness, often worse than her legs.    She has heat intolerance and poor exercise tolerance.      She has good bladder function.    She may have IBS.  She has been 20/20 with  vision Has recent eye exam and was told she had dry eyes.  She gets frequent headaches.     She has a lot of fatigue.   She uses CPAP nightly.  Sometimes wakes up with a HA.  She has bipolar disease and also diagnosed with PTSD, borderline personality disease and depression.  She sees Quenton Fetter [facility]) a [facility].     She feels better since lamotrigine was increased.     She notes mild cognitive issues - following instructions, retaining what she has read, etc.    She is on Cosentyx for psoriasis and was on Taltz in the past.         MS History  She was diagnosed by Dr. Priscille Loveless in 2006 in [facility] after presenting with right optic neuritis.  A few years earlier, she had an MRI showing white matter lesions concerning for MS.  A spinal tap before the ON was non-diagnostic.  She was on Copaxone and then Betaseron.   She then switched to Tysabri around 2007-2011 and then stopped for childbearing. 2012-2016.   She had some fluctuations of symptoms those years but no definite exaceration.   She was placed on Tecfidera around 2016 but stopped due to low WBC.  She has been on Ocrevus since 2017.  On it she has had no exacerbations (  last infusion was July 2023).     Her mother had dementia and also cerebellar atrophy.      Imaging: MRI of the brain 05/22/2021 shows multiple T2/FLAIR hyperintense foci in the periventricular, juxtacortical and deep white matter of the hemispheres.  None of the foci enhance or appear to be acute.  MRI of the brain 04/16/2020 shows multiple T2/FLAIR hyperintense foci in the periventricular, juxtacortical and deep white matter of the hemispheres.  None of the foci enhance or appear to be acute.  No infratentorial foci noted.     REVIEW OF SYSTEMS: Constitutional: No fevers, chills, sweats, or change in appetite.  Other fatigue Eyes: No visual changes, double vision.  No eye pain but has eye irritation Ear, nose and throat: No hearing loss, ear pain,  nasal congestion, sore throat Cardiovascular: No chest pain, palpitations Respiratory:  No shortness of breath at rest or with exertion.   No wheezes GastrointestinaI: No nausea, vomiting, diarrhea, abdominal pain, fecal incontinence Genitourinary:  No dysuria, urinary retention or frequency.  No nocturia. Musculoskeletal:  No neck pain, back pain Integumentary: No rash, pruritus, skin lesions Neurological: as above Psychiatric: She has bipolar disease and depression.   Endocrine: No palpitations, diaphoresis, change in appetite, change in weigh or increased thirst Hematologic/Lymphatic:  No anemia, purpura, petechiae. Allergic/Immunologic: No itchy/runny eyes, nasal congestion, recent allergic reactions, rashes  ALLERGIES: Allergies  Allergen Reactions   Ciprofloxacin Other (See Comments)    Other reaction(s): Mental Status Changes  Manic episode   Manic episode Manic episode Other reaction(s): Mental Status Changes  Manic episode   Prednisone Other (See Comments)    Other reaction(s):  Puts her in manic episode. She is Bipolar   Puts her in manic episode. She is Bipolar Puts her in manic episode. She is Bipolar Other reaction(s):  Puts her in manic episode. She is Bipolar   African Mango [Irvingia Gabonensis] Other (See Comments)   Atorvastatin Other (See Comments)    Myalgias   Rosuvastatin Other (See Comments)    Myalgias    HOME MEDICATIONS:  Current Outpatient Medications:    ALPRAZolam (XANAX) 1 MG tablet, Take 2 pills about 30 min prior to the MRI. Can take additional tablet at the time of test if needed.Can cause drowsiness. Must have driver to and from the test, Disp: 3 tablet, Rfl: 0   Armodafinil 200 MG TABS, Take 1 tablet by mouth daily., Disp: , Rfl:    clobetasol cream (TEMOVATE) 0.05 %, Apply 1 Application topically 2 (two) times daily., Disp: , Rfl:    lamoTRIgine (LAMICTAL) 200 MG tablet, Take 200 mg by mouth daily., Disp: , Rfl:    metFORMIN  (GLUCOPHAGE) 500 MG tablet, Take 1 tablet (500 mg total) by mouth daily., Disp: 90 tablet, Rfl: 0   miconazole (MICOTIN) 2 % cream, Apply 1 Application topically 2 (two) times daily., Disp: 56.7 g, Rfl: 1   Multiple Vitamin (MULTIVITAMIN) tablet, Take 1 tablet by mouth daily., Disp: , Rfl:    omeprazole (PRILOSEC) 40 MG capsule, Take 1 capsule (40 mg total) by mouth daily., Disp: 90 capsule, Rfl: 3   ondansetron (ZOFRAN) 8 MG tablet, 1 tablet every 8 hours if absolutely needed for nausea., Disp: 10 tablet, Rfl: 0   Vitamin D, Ergocalciferol, (DRISDOL) 1.25 MG (50000 UNIT) CAPS capsule, Take 1 capsule (50,000 Units total) by mouth every 7 (seven) days., Disp: 12 capsule, Rfl: 3   VTAMA 1 % CREA, Apply 1 Application topically daily., Disp: , Rfl:  zolmitriptan (ZOMIG) 5 MG tablet, Take 1 p.o. as needed for migraine.  May repeat in 2 hours.  No more than 2 pills in a day or 4 pills in any week., Disp: 10 tablet, Rfl: 11  PAST MEDICAL HISTORY: Past Medical History:  Diagnosis Date   Alcoholism (HCC)    Anxiety    Anxiety and depression    Bipolar 2 disorder (HCC)    Borderline personality disorder (HCC)    Chest pain    Chronic fatigue syndrome    Claustrophobia    on meds   Constipation    Depression    on meds   Elevated blood sugar 06/08/2014   Elevated LFTs    Fatigue    Fatty liver 10/2014   GERD (gastroesophageal reflux disease)    diet related   Heart murmur    Hyperlipidemia    on meds   Infertility associated with anovulation    Joint pain    Low grade squamous intraepithelial lesion (LGSIL) on Pap smear 10/30/2011   Major depressive disorder    Migraines    MS (multiple sclerosis) (HCC)    Neuromuscular disorder (HCC)    Obesity    Obesity in pregnancy, antepartum    PCOS (polycystic ovarian syndrome)    Personal history of pre-term labor    Prediabetes    Pregnancy induced hypertension    pregnancy related- hx of   Psoriasis    Sleep apnea    SOB (shortness  of breath)    Vitamin D deficiency     PAST SURGICAL HISTORY: Past Surgical History:  Procedure Laterality Date   COLONOSCOPY  10/17/2020   DILATION AND CURETTAGE OF UTERUS  08/15/2008   DILATION AND EVACUATION  12/29/2011   Procedure: DILATATION AND EVACUATION;  Surgeon: Catalina Antigua, MD;  Location: [facility];  Service: Gynecology;;  Dr, Emelda Fear transferred case to Dr. Jolayne Panther   LAPAROSCOPIC GASTRIC SLEEVE RESECTION N/A 12/07/2014   Procedure: LAPAROSCOPIC GASTRIC SLEEVE RESECTION;  Surgeon: Ovidio Kin, MD;  Location: [facility];  Service: General;  Laterality: N/A;    FAMILY HISTORY: Family History  Problem Relation Age of Onset   Hypertension Mother    Hyperlipidemia Mother    Mental illness Mother    Dementia Mother        early onset   Depression Mother    Anxiety disorder Mother    Drug abuse Mother    Eating disorder Mother    Alcoholism Father    Heart disease Father    Alcoholism Maternal Grandmother    Mental illness Maternal Grandmother    Esophageal cancer Maternal Grandmother    Heart attack Maternal Grandfather    Colon cancer Neg Hx    Stomach cancer Neg Hx    Colon polyps Neg Hx    Rectal cancer Neg Hx     SOCIAL HISTORY: Social History   Socioeconomic History   Marital status: Married    Spouse name: Pennie Rushing   Number of children: 3   Years of education: 14   Highest education level: Not on file  Occupational History   Occupation: Print production planner   Occupation: stay at home mom, part time daycare  Tobacco Use   Smoking status: Former    Current packs/day: 0.00    Average packs/day: 1 pack/day for 15.0 years (15.0 ttl pk-yrs)    Types: Cigarettes    Start date: 07/02/1995    Quit date: 07/02/2010    Years since quitting: 12.2   Smokeless tobacco:  Never  Vaping Use   Vaping status: Never Used  Substance and Sexual Activity   Alcohol use: Not Currently    Comment: recovery alcoholic   Drug use: No   Sexual activity: Yes    Partners: Male     Birth control/protection: None    Comment: vasectomy  Other Topics Concern   Not on file  Social History Narrative   Work or School: homemaker      Home Situation: lives with husband, 2 twins and 1 boy      Spiritual Beliefs: none      Lifestyle: no regular exercise, poor diet   Caffeine use: diet soda daily      Social Determinants of Health   Financial Resource Strain: Not on file  Food Insecurity: Not on file  Transportation Needs: Not on file  Physical Activity: Not on file  Stress: Not on file  Social Connections: Unknown (04/12/2022)   Received from [facility], [facility]   Social Network    Social Network: Not on file  Intimate Partner Violence: Unknown (04/12/2022)   [facility] from [facility], [facility]   HITS    Physically Hurt: Not on file    Insult or Talk Down To: Not on file    Threaten Physical Harm: Not on file    Scream or Curse: Not on file       PHYSICAL EXAM  Vitals:   10/11/22 1000  BP: 124/80  Pulse: 90  Weight: 225 lb 8 oz (102.3 kg)  Height: 5\' 6"  (1.676 m)    Body mass index is 36.4 kg/m.   General: The patient is well-developed and well-nourished and in no acute distress  HEENT:  Head is Tiro/AT.  Sclera are anicteric.    Skin: Extremities are without rash or  edema.    Neurologic Exam  Mental status: The patient is alert and oriented x 3 at the time of the examination. The patient has apparent normal recent and remote memory, with an apparently normal attention span and concentration ability.   Speech is normal.  Cranial nerves: Extraocular movements are full. Pupils are equal, round, and reactive to light and accomodation.  Color vision was symmetric.  Facial strength is normal .   Sensation is normal.   palate. No obvious hearing deficits are noted.  Motor:  Muscle bulk is normal.   Tone is normal. Strength is  5 / 5 in all 4 extremities.   Sensory: Sensory testing is intact to pinprick, soft touch and vibration  sensation in all 4 extremities.  Coordination: Cerebellar testing reveals good finger-nose-finger and heel-to-shin bilaterally.  Gait and station: Station is normal.   Gait is normal.  The tandem gait is mildly wide.  The Romberg is negative.   Reflexes: Deep tendon reflexes are symmetric and normal bilaterally.       DIAGNOSTIC DATA (LABS, IMAGING, TESTING) - I reviewed patient records, labs, notes, testing and imaging myself where available.  Lab Results  Component Value Date   WBC 5.7 10/09/2022   HGB 13.9 10/09/2022   HCT 44.2 10/09/2022   MCV 91 10/09/2022   PLT 324 10/09/2022      Component Value Date/Time   NA 139 10/09/2022 0956   K 4.0 10/09/2022 0956   CL 101 10/09/2022 0956   CO2 24 10/09/2022 0956   GLUCOSE 79 10/09/2022 0956   GLUCOSE 92 08/14/2022 1304   GLUCOSE 93 11/24/2013 1126   BUN 11 10/09/2022 0956   CREATININE 0.59  10/09/2022 0956   CREATININE 0.71 08/14/2022 1304   CREATININE 0.43 (L) 12/31/2013 1542   CALCIUM 9.4 10/09/2022 0956   PROT 6.8 10/09/2022 0956   ALBUMIN 4.6 10/09/2022 0956   AST 20 10/09/2022 0956   AST 17 08/14/2022 1304   ALT 17 10/09/2022 0956   ALT 15 08/14/2022 1304   ALKPHOS 69 10/09/2022 0956   BILITOT <0.2 10/09/2022 0956   BILITOT 0.3 08/14/2022 1304   GFRNONAA >60 08/14/2022 1304   GFRAA >60 12/02/2014 1120   Lab Results  Component Value Date   CHOL 275 (H) 10/09/2022   HDL 43 10/09/2022   LDLCALC 166 (H) 10/09/2022   LDLDIRECT 236.0 12/22/2019   TRIG 345 (H) 10/09/2022   CHOLHDL 3 06/17/2020   Lab Results  Component Value Date   HGBA1C 5.5 10/09/2022   Lab Results  Component Value Date   VITAMINB12 447 10/09/2022   Lab Results  Component Value Date   TSH 0.744 10/09/2022       ASSESSMENT AND PLAN  Multiple sclerosis (HCC)  High risk medication use  Vitamin D deficiency  Gait disorder  Bipolar affective disorder, remission status unspecified (HCC)  Plaque psoriasis  Other migraine  without status migrainosus, not intractable   Continue Ocrevus .  Can skip labs as CBC/D recently was fine and IgG/IgM well into normal range.   Try to lose more weight and continue to exercise She will try Provigil instead of Nuvigil and if not better go back to Vyvanse.   Consider amantadine.   Rtc 6 months or sooner if new or worsening symptoms  Colson Barco A. Epimenio Foot, MD, [facility] 10/11/2022, 12:54 PM Certified in Neurology, Clinical Neurophysiology, Sleep Medicine and Neuroimaging  [facility] [address] [place], [place] [address] [phone]

## 2022-11-05 ENCOUNTER — Inpatient Hospital Stay (HOSPITAL_BASED_OUTPATIENT_CLINIC_OR_DEPARTMENT_OTHER): Payer: 59 | Admitting: Internal Medicine

## 2022-11-05 ENCOUNTER — Other Ambulatory Visit: Payer: Self-pay | Admitting: *Deleted

## 2022-11-05 ENCOUNTER — Other Ambulatory Visit: Payer: Self-pay

## 2022-11-05 ENCOUNTER — Inpatient Hospital Stay: Payer: 59 | Attending: Physician Assistant

## 2022-11-05 ENCOUNTER — Telehealth: Payer: Self-pay | Admitting: Internal Medicine

## 2022-11-05 ENCOUNTER — Ambulatory Visit (INDEPENDENT_AMBULATORY_CARE_PROVIDER_SITE_OTHER): Payer: 59 | Admitting: Internal Medicine

## 2022-11-05 ENCOUNTER — Encounter (HOSPITAL_BASED_OUTPATIENT_CLINIC_OR_DEPARTMENT_OTHER): Payer: Self-pay | Admitting: Internal Medicine

## 2022-11-05 VITALS — BP 118/76 | HR 90 | Ht 66.0 in | Wt 236.5 lb

## 2022-11-05 VITALS — BP 129/79 | HR 82 | Temp 98.3°F | Resp 17 | Ht 66.0 in | Wt 236.9 lb

## 2022-11-05 DIAGNOSIS — G35 Multiple sclerosis: Secondary | ICD-10-CM | POA: Diagnosis not present

## 2022-11-05 DIAGNOSIS — M791 Myalgia, unspecified site: Secondary | ICD-10-CM | POA: Diagnosis not present

## 2022-11-05 DIAGNOSIS — E7800 Pure hypercholesterolemia, unspecified: Secondary | ICD-10-CM

## 2022-11-05 DIAGNOSIS — D509 Iron deficiency anemia, unspecified: Secondary | ICD-10-CM | POA: Diagnosis present

## 2022-11-05 DIAGNOSIS — Z79899 Other long term (current) drug therapy: Secondary | ICD-10-CM | POA: Diagnosis not present

## 2022-11-05 DIAGNOSIS — Z8342 Family history of familial hypercholesterolemia: Secondary | ICD-10-CM | POA: Diagnosis not present

## 2022-11-05 DIAGNOSIS — D500 Iron deficiency anemia secondary to blood loss (chronic): Secondary | ICD-10-CM | POA: Diagnosis not present

## 2022-11-05 DIAGNOSIS — T466X5A Adverse effect of antihyperlipidemic and antiarteriosclerotic drugs, initial encounter: Secondary | ICD-10-CM

## 2022-11-05 LAB — IRON AND IRON BINDING CAPACITY (CC-WL,HP ONLY)
Iron: 75 ug/dL (ref 28–170)
Saturation Ratios: 19 % (ref 10.4–31.8)
TIBC: 386 ug/dL (ref 250–450)
UIBC: 311 ug/dL (ref 148–442)

## 2022-11-05 LAB — CBC WITH DIFFERENTIAL (CANCER CENTER ONLY)
Abs Immature Granulocytes: 0.03 10*3/uL (ref 0.00–0.07)
Basophils Absolute: 0.1 10*3/uL (ref 0.0–0.1)
Basophils Relative: 1 %
Eosinophils Absolute: 0.2 10*3/uL (ref 0.0–0.5)
Eosinophils Relative: 3 %
HCT: 39.4 % (ref 36.0–46.0)
Hemoglobin: 13.6 g/dL (ref 12.0–15.0)
Immature Granulocytes: 0 %
Lymphocytes Relative: 16 %
Lymphs Abs: 1.1 10*3/uL (ref 0.7–4.0)
MCH: 30.9 pg (ref 26.0–34.0)
MCHC: 34.5 g/dL (ref 30.0–36.0)
MCV: 89.5 fL (ref 80.0–100.0)
Monocytes Absolute: 0.5 10*3/uL (ref 0.1–1.0)
Monocytes Relative: 8 %
Neutro Abs: 4.9 10*3/uL (ref 1.7–7.7)
Neutrophils Relative %: 72 %
Platelet Count: 332 10*3/uL (ref 150–400)
RBC: 4.4 MIL/uL (ref 3.87–5.11)
RDW: 14.8 % (ref 11.5–15.5)
WBC Count: 6.8 10*3/uL (ref 4.0–10.5)
nRBC: 0 % (ref 0.0–0.2)

## 2022-11-05 LAB — FERRITIN: Ferritin: 29 ng/mL (ref 11–307)

## 2022-11-05 NOTE — Telephone Encounter (Signed)
Patient has been identified as candidate for Leqvio  Benefits investigation enrollment faxed on 11/05/22.

## 2022-11-05 NOTE — Progress Notes (Signed)
LIPID CLINIC CONSULT NOTE  Chief Complaint:  Dyslipidemia, statin intolerance  Primary Care Physician: [name], MD  Primary Cardiologist:  None  HPI:  Charlene [name] is a 42 y.o. female who is being seen today for the evaluation of dyslipidemia at the request of Wilder Glade, MD. this is a pleasant 42 year old female kindly referred for evaluation management of dyslipidemia.  Unfortunately she has statin intolerance.  She has a longstanding history of high cholesterol and reports her mother had high cholesterol as well and has developed some dementia.  Interestingly her cholesterol was well over 300 total in the past with an LDL over 190 and then she was placed on statin therapy.  She tried both high intensity rosuvastatin and atorvastatin and at 1 point her cholesterol had improved to total 144, triglycerides 106, HDL 48 and LDL 77.  Unfortunately this caused her significant myalgias and after stopping the medicine her cholesterol has gone up now to total 275, triglycerides 345, HDL 43 and LDL 166.  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  With Alzheimer's type dementia in her mother, it is concerning for a possible APO E variant which may also be associated with risk of this.  She needs at least 50% lipid-lowering per guidelines for familial hyperlipidemia and may benefit from PCSK9 inhibition.  After discussing options today she had some concerns about doing injections.  She had previously done IVF therapy and feels that she could not inject herself.  We talked about other options including Leqvio which is provider administered and oral combination therapy such as Nexlizet.  She is working with Dr. Dalbert Garnet on weight loss and dietary changes.  She has some history of fatty liver in the past.  She has had elevated liver enzymes.  She also has had increased alcohol use in the past but not recently.  PMHx:  Past Medical  History:  Diagnosis Date   Alcoholism (HCC)    Anxiety    Anxiety and depression    Bipolar 2 disorder (HCC)    Borderline personality disorder (HCC)    Chest pain    Chronic fatigue syndrome    Claustrophobia    on meds   Constipation    Depression    on meds   Elevated blood sugar 06/08/2014   Elevated LFTs    Fatigue    Fatty liver 10/2014   GERD (gastroesophageal reflux disease)    diet related   Heart murmur    Hyperlipidemia    on meds   Infertility associated with anovulation    Joint pain    Low grade squamous intraepithelial lesion (LGSIL) on Pap smear 10/30/2011   Major depressive disorder    Migraines    MS (multiple sclerosis) (HCC)    Neuromuscular disorder (HCC)    Obesity    Obesity in pregnancy, antepartum    PCOS (polycystic ovarian syndrome)    Personal history of pre-term labor    Prediabetes    Pregnancy induced hypertension    pregnancy related- hx of   Psoriasis    Sleep apnea    SOB (shortness of breath)    Vitamin D deficiency     Past Surgical History:  Procedure Laterality Date   COLONOSCOPY  10/17/2020   DILATION AND CURETTAGE OF UTERUS  08/15/2008   DILATION AND EVACUATION  12/29/2011   Procedure: DILATATION AND EVACUATION;  Surgeon: Catalina Antigua, MD;  Location: [facility]  [facility];  Service: Gynecology;;  Dr, Emelda Fear transferred case to Dr. Jolayne Panther   LAPAROSCOPIC GASTRIC SLEEVE RESECTION N/A 12/07/2014   Procedure: LAPAROSCOPIC GASTRIC SLEEVE RESECTION;  Surgeon: Ovidio Kin, MD;  Location: [facility];  Service: General;  Laterality: N/A;    FAMHx:  Family History  Problem Relation Age of Onset   Hypertension Mother    Hyperlipidemia Mother    Mental illness Mother    Dementia Mother        early onset   Depression Mother    Anxiety disorder Mother    Drug abuse Mother    Eating disorder Mother    Alcoholism Father    Heart disease Father    Alcoholism Maternal Grandmother    Mental illness Maternal Grandmother    Esophageal cancer  Maternal Grandmother    Heart attack Maternal Grandfather    Colon cancer Neg Hx    Stomach cancer Neg Hx    Colon polyps Neg Hx    Rectal cancer Neg Hx     SOCHx:   reports that she quit smoking about 12 years ago. Her smoking use included cigarettes. She started smoking about 27 years ago. She has a 15 pack-year smoking history. She has never used smokeless tobacco. She reports that she does not currently use alcohol. She reports that she does not use drugs.  ALLERGIES:  Allergies  Allergen Reactions   Ciprofloxacin Other (See Comments)    Other reaction(s): Mental Status Changes  Manic episode   Manic episode Manic episode Other reaction(s): Mental Status Changes  Manic episode   Prednisone Other (See Comments)    Other reaction(s):  Puts her in manic episode. She is Bipolar   Puts her in manic episode. She is Bipolar Puts her in manic episode. She is Bipolar Other reaction(s):  Puts her in manic episode. She is Bipolar   African Mango [Irvingia Gabonensis] Other (See Comments)   Atorvastatin Other (See Comments)    Myalgias   Rosuvastatin Other (See Comments)    Myalgias    ROS: Pertinent items noted in HPI and remainder of comprehensive ROS otherwise negative.  HOME MEDS: Current Outpatient Medications on File Prior to Visit  Medication Sig Dispense Refill   Armodafinil 200 MG TABS Take 1 tablet by mouth daily.     clobetasol cream (TEMOVATE) 0.05 % Apply 1 Application topically 2 (two) times daily.     lamoTRIgine (LAMICTAL) 200 MG tablet Take 200 mg by mouth daily.     metFORMIN (GLUCOPHAGE) 500 MG tablet Take 1 tablet (500 mg total) by mouth daily. 90 tablet 0   miconazole (MICOTIN) 2 % cream Apply 1 Application topically 2 (two) times daily. 56.7 g 1   modafinil (PROVIGIL) 100 MG tablet Take 100 mg by mouth daily.     Multiple Vitamin (MULTIVITAMIN) tablet Take 1 tablet by mouth daily.     omeprazole (PRILOSEC) 40 MG capsule Take 1 capsule (40 mg total) by  mouth daily. 90 capsule 3   ondansetron (ZOFRAN) 8 MG tablet 1 tablet every 8 hours if absolutely needed for nausea. 10 tablet 0   Sulfacetamide Sodium-Sulfur 10-2 % LIQD Apply topically as needed.     Vitamin D, Ergocalciferol, (DRISDOL) 1.25 MG (50000 UNIT) CAPS capsule Take 1 capsule (50,000 Units total) by mouth every 7 (seven) days. 12 capsule 3   VTAMA 1 % CREA Apply 1 Application topically daily.     zolmitriptan (ZOMIG) 5 MG tablet Take 1 p.o. as needed for migraine.  May  repeat in 2 hours.  No more than 2 pills in a day or 4 pills in any week. 10 tablet 11   No current facility-administered medications on file prior to visit.    LABS/IMAGING: No results found for this or any previous visit (from the past 48 hour(s)). No results found.  LIPID PANEL:    Component Value Date/Time   CHOL 275 (H) 10/09/2022 0956   TRIG 345 (H) 10/09/2022 0956   HDL 43 10/09/2022 0956   CHOLHDL 3 06/17/2020 1139   VLDL 27.6 06/17/2020 1139   LDLCALC 166 (H) 10/09/2022 0956   LDLDIRECT 236.0 12/22/2019 1131    WEIGHTS: Wt Readings from Last 3 Encounters:  11/05/22 236 lb 8 oz (107.3 kg)  10/11/22 225 lb 8 oz (102.3 kg)  10/09/22 220 lb (99.8 kg)    VITALS: BP 118/76 (BP Location: Left Arm, Patient Position: Sitting, Cuff Size: Large)   Pulse 90   Ht 5\' 6"  (1.676 m)   Wt 236 lb 8 oz (107.3 kg)   SpO2 98%   BMI 38.17 kg/m   EXAM: Deferred  EKG: Deferred  ASSESSMENT: Possible familial hyperlipidemia, LDL greater than 190, based on Simon-Broome Criteriua Family history of high cholesterol in her mother who has dementia Statin intolerance-myalgias Hepatic steatosis with elevated liver enzymes  PLAN: 1.   Ms. Nugen has a possible familial hyperlipidemia with LDL greater than 190 and a family history of high cholesterol in her mother.  There is no known cardiovascular disease however she does have a history of hepatic steatosis with elevated liver enzymes.  She is not interested in  giving herself injections and therefore the antibody PCSK9 inhibitors would not be an option.  We discussed the possibility of Leqvio which is provider administered or Nexlizet combination oral therapy which could give her the 50% reduction in cholesterol that is needed.  After discussing the risks and benefits and potential side effects of these medications she would like to pursue Leqvio.  Will reach out for benefits determination for this.  Plan follow-up with repeat lipid assessment based on therapeutic option going forward.  Thanks as always for the kind referral.  Chrystie Nose, MD, [facility]oll  [place]  [facility]  Medical Director of the [facility] &  Cardiovascular Risk Reduction Clinic Diplomate of the American Board of Clinical Lipidology Attending Cardiologist  Direct Dial: [phone]  Fax: [phone]  Website:  [email or web address] Nicely Darey Hershberger 11/05/2022, 10:28 AM

## 2022-11-05 NOTE — Patient Instructions (Signed)
Medication Instructions:  Dr. Rennis Golden has recommended an injectable medication called LEQVIO. This is administered by a health care provider. The frequency of injections is TWO injections given 3 months apart (loading dose) and then every 6 months after that. The injection appointments at [facility] ([address], [address] [place], [place]  [address]). Once we have the benefits check information, we will reach out to let you know if the medication is covered 100%, if there is a deductible, co-insurance, out-of-pocket max. From there, we will see if you need patient assistance and our team will take care of working on this. Because of the frequency schedule of this medication, your follow up/repeat cholesterol lab work will be about 5-6 months from now.    Lab Work: FASTING lab work in 5-6 months to check cholesterol   If you have labs (blood work) drawn today and your tests are completely normal, you will receive your results only by: [employer] (if you have MyChart) OR A paper copy in the mail If you have any lab test that is abnormal or we need to change your treatment, we will call you to review the results.   Follow-Up: At [facility], you and your health needs are our priority.  As part of our continuing mission to provide you with exceptional heart care, we have created designated Provider Care Teams.  These Care Teams include your primary Cardiologist (physician) and Advanced Practice Providers (APPs -  Physician Assistants and Nurse Practitioners) who all work together to provide you with the care you need, when you need it.  We recommend signing up for the patient portal called "MyChart".  Sign up information is provided on this After Visit Summary.  MyChart is used to connect with patients for Virtual Visits (Telemedicine).  Patients are able to view lab/test results, encounter notes, upcoming appointments, etc.  Non-urgent messages can be sent to your  provider as well.   To learn more about what you can do with MyChart, go to [email or web address].    Your next appointment:    5-6 months with Dr. Rennis Golden

## 2022-11-05 NOTE — Progress Notes (Signed)
[facility] [phone]   [phone]  OFFICE PROGRESS NOTE  [name], MD [address] [place] [address]  DIAGNOSIS: Iron deficiency anemia secondary to history of gastric ulcer and hemorrhoids  PRIOR THERAPY: Iron infusion with Venofer 300 Mg IV weekly for 3 weeks  CURRENT THERAPY: Over-the-counter ferrous sulfate with vitamin C  INTERVAL HISTORY: Charlene [name] 42 y.o. female returns to the clinic today for follow-up visit.Discussed the use of AI scribe software for clinical note transcription with the patient, who gave verbal consent to proceed.  History of Present Illness   The patient, a 42 year old female with a history of iron deficiency anemia and multiple sclerosis, presented for a follow-up visit after receiving three infusions of Venofer (300mg  each) for her anemia. She reported a substantial improvement in her condition post-infusion, with increased energy levels and overall well-being. However, she also noted that she is not at her optimal health due to her concurrent diagnosis of multiple sclerosis.   She reported no recent episodes of dizziness, chest pain, or breathing issues. However, she did mention having sleep apnea and not using her prescribed machine regularly. She denied any gastrointestinal symptoms such as nausea, vomiting, diarrhea, or constipation.       MEDICAL HISTORY: Past Medical History:  Diagnosis Date   Alcoholism (HCC)    Anxiety    Anxiety and depression    Bipolar 2 disorder (HCC)    Borderline personality disorder (HCC)    Chest pain    Chronic fatigue syndrome    Claustrophobia    on meds   Constipation    Depression    on meds   Elevated blood sugar 06/08/2014   Elevated LFTs    Fatigue    Fatty liver 10/2014   GERD (gastroesophageal reflux disease)    diet related   Heart murmur    Hyperlipidemia    on meds   Infertility associated with anovulation    Joint pain     Low grade squamous intraepithelial lesion (LGSIL) on Pap smear 10/30/2011   Major depressive disorder    Migraines    MS (multiple sclerosis) (HCC)    Neuromuscular disorder (HCC)    Obesity    Obesity in pregnancy, antepartum    PCOS (polycystic ovarian syndrome)    Personal history of pre-term labor    Prediabetes    Pregnancy induced hypertension    pregnancy related- hx of   Psoriasis    Sleep apnea    SOB (shortness of breath)    Vitamin D deficiency     ALLERGIES:  is allergic to ciprofloxacin, prednisone, african mango [irvingia gabonensis], atorvastatin, and rosuvastatin.  MEDICATIONS:  Current Outpatient Medications  Medication Sig Dispense Refill   Armodafinil 200 MG TABS Take 1 tablet by mouth daily.     clobetasol cream (TEMOVATE) 0.05 % Apply 1 Application topically 2 (two) times daily.     lamoTRIgine (LAMICTAL) 200 MG tablet Take 200 mg by mouth daily.     metFORMIN (GLUCOPHAGE) 500 MG tablet Take 1 tablet (500 mg total) by mouth daily. 90 tablet 0   miconazole (MICOTIN) 2 % cream Apply 1 Application topically 2 (two) times daily. 56.7 g 1   modafinil (PROVIGIL) 100 MG tablet Take 100 mg by mouth daily.     Multiple Vitamin (MULTIVITAMIN) tablet Take 1 tablet by mouth daily.     omeprazole (PRILOSEC) 40 MG capsule Take 1 capsule (40 mg total)  by mouth daily. 90 capsule 3   ondansetron (ZOFRAN) 8 MG tablet 1 tablet every 8 hours if absolutely needed for nausea. 10 tablet 0   Sulfacetamide Sodium-Sulfur 10-2 % LIQD Apply topically as needed.     Vitamin D, Ergocalciferol, (DRISDOL) 1.25 MG (50000 UNIT) CAPS capsule Take 1 capsule (50,000 Units total) by mouth every 7 (seven) days. 12 capsule 3   VTAMA 1 % CREA Apply 1 Application topically daily.     zolmitriptan (ZOMIG) 5 MG tablet Take 1 p.o. as needed for migraine.  May repeat in 2 hours.  No more than 2 pills in a day or 4 pills in any week. 10 tablet 11   No current facility-administered medications for this  visit.    SURGICAL HISTORY:  Past Surgical History:  Procedure Laterality Date   COLONOSCOPY  10/17/2020   DILATION AND CURETTAGE OF UTERUS  08/15/2008   DILATION AND EVACUATION  12/29/2011   Procedure: DILATATION AND EVACUATION;  Surgeon: Catalina Antigua, MD;  Location: [facility];  Service: Gynecology;;  Dr, Emelda Fear transferred case to Dr. Jolayne Panther   LAPAROSCOPIC GASTRIC SLEEVE RESECTION N/A 12/07/2014   Procedure: LAPAROSCOPIC GASTRIC SLEEVE RESECTION;  Surgeon: Ovidio Kin, MD;  Location: [facility];  Service: General;  Laterality: N/A;    REVIEW OF SYSTEMS:  A comprehensive review of systems was negative except for: Constitutional: positive for fatigue   PHYSICAL EXAMINATION: General appearance: alert, cooperative, fatigued, and no distress Head: Normocephalic, without obvious abnormality, atraumatic Neck: no adenopathy, no JVD, supple, symmetrical, trachea midline, and thyroid not enlarged, symmetric, no tenderness/mass/nodules Lymph nodes: Cervical, supraclavicular, and axillary nodes normal. Resp: clear to auscultation bilaterally Back: symmetric, no curvature. ROM normal. No CVA tenderness. Cardio: regular rate and rhythm, S1, S2 normal, no murmur, click, rub or gallop GI: soft, non-tender; bowel sounds normal; no masses,  no organomegaly Extremities: extremities normal, atraumatic, no cyanosis or edema  ECOG PERFORMANCE STATUS: 1 - Symptomatic but completely ambulatory  Blood pressure 129/79, pulse 82, temperature 98.3 F (36.8 C), temperature source Oral, resp. rate 17, height 5\' 6"  (1.676 m), weight 236 lb 14.4 oz (107.5 kg), SpO2 100%.  LABORATORY DATA: Lab Results  Component Value Date   WBC 6.8 11/05/2022   HGB 13.6 11/05/2022   HCT 39.4 11/05/2022   MCV 89.5 11/05/2022   PLT 332 11/05/2022      Chemistry      Component Value Date/Time   NA 139 10/09/2022 0956   K 4.0 10/09/2022 0956   CL 101 10/09/2022 0956   CO2 24 10/09/2022 0956   BUN 11 10/09/2022 0956    CREATININE 0.59 10/09/2022 0956   CREATININE 0.71 08/14/2022 1304   CREATININE 0.43 (L) 12/31/2013 1542      Component Value Date/Time   CALCIUM 9.4 10/09/2022 0956   ALKPHOS 69 10/09/2022 0956   AST 20 10/09/2022 0956   AST 17 08/14/2022 1304   ALT 17 10/09/2022 0956   ALT 15 08/14/2022 1304   BILITOT <0.2 10/09/2022 0956   BILITOT 0.3 08/14/2022 1304       RADIOGRAPHIC STUDIES: No results found.  ASSESSMENT AND PLAN: This is a very pleasant 42 years old white female with history of iron deficiency anemia secondary to 3 of gastrointestinal blood loss.    Iron Deficiency Anemia Improved symptoms after receiving three infusions of Venofer (300mg  each). Currently not taking any iron supplements except for a multivitamin with iron. CBC today is normal with hemoglobin 13.6 and hematocrit 39.4. Iron studies and  ferritin are pending. -If iron studies and ferritin are low, arrange for additional iron infusions. -If iron studies and ferritin are acceptable, no additional intervention needed at this time. -Continue multivitamin with iron. -Consider over-the-counter iron supplement (65mg ) every other day with orange juice or vitamin C if tolerated. -Return in three months for repeat CBC, iron studies, and ferritin.  Multiple Sclerosis Contributing to fatigue. No change in management discussed.  Sleep Apnea Not currently using CPAP machine. No change in management discussed.  She was advised to call immediately if she has any other concerning symptoms in the interval.   The patient voices understanding of current disease status and treatment options and is in agreement with the current care plan.  All questions were answered. The patient knows to call the clinic with any problems, questions or concerns. We can certainly see the patient much sooner if necessary.  The total time spent in the appointment was 20 minutes.  Disclaimer: This note was dictated with voice recognition software.  Similar sounding words can inadvertently be transcribed and may not be corrected upon review.

## 2022-11-06 ENCOUNTER — Ambulatory Visit (INDEPENDENT_AMBULATORY_CARE_PROVIDER_SITE_OTHER): Payer: 59 | Admitting: Family Medicine

## 2022-11-06 ENCOUNTER — Encounter: Payer: 59 | Admitting: Gastroenterology

## 2022-11-06 ENCOUNTER — Encounter (INDEPENDENT_AMBULATORY_CARE_PROVIDER_SITE_OTHER): Payer: Self-pay | Admitting: Family Medicine

## 2022-11-06 VITALS — BP 128/80 | HR 61 | Temp 98.1°F | Ht 66.0 in | Wt 234.0 lb

## 2022-11-06 DIAGNOSIS — E559 Vitamin D deficiency, unspecified: Secondary | ICD-10-CM | POA: Diagnosis not present

## 2022-11-06 DIAGNOSIS — Z6837 Body mass index (BMI) 37.0-37.9, adult: Secondary | ICD-10-CM

## 2022-11-06 DIAGNOSIS — R632 Polyphagia: Secondary | ICD-10-CM

## 2022-11-06 DIAGNOSIS — E88819 Insulin resistance, unspecified: Secondary | ICD-10-CM

## 2022-11-06 DIAGNOSIS — E669 Obesity, unspecified: Secondary | ICD-10-CM

## 2022-11-06 MED ORDER — METFORMIN HCL 500 MG PO TABS: 500.0000 mg | ORAL_TABLET | Freq: Every day | ORAL | 0 refills | Status: DC

## 2022-11-06 MED ORDER — VITAMIN D (ERGOCALCIFEROL) 1.25 MG (50000 UNIT) PO CAPS: 50000.0000 [IU] | ORAL_CAPSULE | ORAL | 3 refills | Status: DC

## 2022-11-06 MED ORDER — WEGOVY 0.25 MG/0.5ML ~~LOC~~ SOAJ: 0.2500 mg | SUBCUTANEOUS | 0 refills | Status: DC

## 2022-11-07 ENCOUNTER — Telehealth: Payer: Self-pay | Admitting: *Deleted

## 2022-11-07 ENCOUNTER — Encounter (INDEPENDENT_AMBULATORY_CARE_PROVIDER_SITE_OTHER): Payer: Self-pay | Admitting: Family Medicine

## 2022-11-07 NOTE — Telephone Encounter (Signed)
Prior authorization done via cover my meds for patients. Wegovy. Waiting on determination.

## 2022-11-07 NOTE — Progress Notes (Signed)
Chief Complaint:   OBESITY Charlene [name] is here to discuss her progress with her obesity treatment plan along with follow-up of her obesity related diagnoses. Charlene [name] is on practicing portion control and making smarter food choices, such as increasing vegetables and decreasing simple carbohydrates and states she is following her eating plan approximately 0% of the time. Charlene [name] states she is doing 0 minutes 0 times per week.  Today's visit was #: 21 Starting weight: 250 lbs Starting date: 03/29/2021 Today's weight: 234 lbs Today's date: 11/06/2022 Total lbs lost to date: 16 Total lbs lost since last in-office visit: 0  Interim History: Patient has struggled more with increased simple carbohydrates and has gotten off track. She had some of her medications adjusted and it appears they had affecting her weight more than she realized. She is ready to work on getting back on track.   Subjective:   1. Insulin resistance Patient is on metformin, and she is working on getting back to her diet and exercise.   2. Vitamin D deficiency Patient is on Vitamin D, and she has no signs of over-replacement.   3. Polyphagia Patient notes increased polyphagia, worse off naltrexone and Vyvanse. Her polyphagia has significantly worsened.   Assessment/Plan:   1. Insulin resistance We will refill metformin 500 mg for 90 days. Patient will continue with her diet, exercise, and weight loss.   - metFORMIN (GLUCOPHAGE) 500 MG tablet; Take 1 tablet (500 mg total) by mouth daily.  Dispense: 90 tablet; Refill: 0  2. Vitamin D deficiency Patient will continue prescription Vitamin D 50,000 IU once weekly, and we will refill for 90 says.   - Vitamin D, Ergocalciferol, (DRISDOL) 1.25 MG (50000 UNIT) CAPS capsule; Take 1 capsule (50,000 Units total) by mouth every 7 (seven) days.  Dispense: 12 capsule; Refill: 3  3. Polyphagia Patient agreed to start Wegovy 0.25 mg once weekly with no refills. Patient is to restart  naltrexone in the meanwhile.   - Semaglutide-Weight Management (WEGOVY) 0.25 MG/0.5ML SOAJ; Inject 0.25 mg into the skin once a week.  Dispense: 2 mL; Refill: 0  4. BMI 37.0-37.9, adult  5. Obesity, Beginning BMI 40.35 Charlene [name] is currently in the action stage of change. As such, her goal is to continue with weight loss efforts. She has agreed to the Category 2 Plan.   Behavioral modification strategies: increasing lean protein intake.  Charlene [name] has agreed to follow-up with our clinic in 3 to 4 weeks. She was informed of the importance of frequent follow-up visits to maximize her success with intensive lifestyle modifications for her multiple health conditions.   Objective:   Blood pressure 128/80, pulse 61, temperature 98.1 F (36.7 C), height 5\' 6"  (1.676 m), weight 234 lb (106.1 kg), SpO2 100%. Body mass index is 37.77 kg/m.  Lab Results  Component Value Date   CREATININE 0.59 10/09/2022   BUN 11 10/09/2022   NA 139 10/09/2022   K 4.0 10/09/2022   CL 101 10/09/2022   CO2 24 10/09/2022   Lab Results  Component Value Date   ALT 17 10/09/2022   AST 20 10/09/2022   ALKPHOS 69 10/09/2022   BILITOT <0.2 10/09/2022   Lab Results  Component Value Date   HGBA1C 5.5 10/09/2022   HGBA1C 5.4 09/05/2022   HGBA1C 5.1 11/08/2021   HGBA1C 5.4 03/29/2021   HGBA1C 4.9 05/05/2018   Lab Results  Component Value Date   INSULIN 9.2 10/09/2022   INSULIN 15.6 11/08/2021   INSULIN 15.6 03/29/2021  Lab Results  Component Value Date   TSH 0.744 10/09/2022   Lab Results  Component Value Date   CHOL 275 (H) 10/09/2022   HDL 43 10/09/2022   LDLCALC 166 (H) 10/09/2022   LDLDIRECT 236.0 12/22/2019   TRIG 345 (H) 10/09/2022   CHOLHDL 3 06/17/2020   Lab Results  Component Value Date   VD25OH 52.1 09/05/2022   VD25OH 68.7 03/28/2022   VD25OH 68.0 11/08/2021   Lab Results  Component Value Date   WBC 6.8 11/05/2022   HGB 13.6 11/05/2022   HCT 39.4 11/05/2022   MCV 89.5 11/05/2022    PLT 332 11/05/2022   Lab Results  Component Value Date   IRON 75 11/05/2022   TIBC 386 11/05/2022   FERRITIN 29 11/05/2022   Attestation Statements:   Reviewed by clinician on day of visit: allergies, medications, problem list, medical history, surgical history, family history, social history, and previous encounter notes.  Time spent on visit including pre-visit chart review and post-visit care and charting was 40 minutes.   I, Burt Knack, am acting as transcriptionist for Quillian Quince, MD.  I have reviewed the above documentation for accuracy and completeness, and I agree with the above. -  Quillian Quince, MD

## 2022-11-07 NOTE — Telephone Encounter (Signed)
Medication Reginal Lutes was approved.  Message from plan: Request Reference Number: [id number]. WEGOVY INJ 0.25MG  is approved through 03/10/2023. Your patient may now fill this prescription and it will be covered.. Authorization Expiration Date: March 10, 2023.

## 2022-11-07 NOTE — Telephone Encounter (Signed)
Patient has been identified as candidate for Leqvio  Benefits investigation enrollment completed on 11/06/22  Benefits investigation report notes the following:  Type of insurance: [employer] commercial   OOP Max: $4200 / Met: $4200  Deductible: $3200 - met  Co-insurance: 10%  PA required: NO - medical benefit coverage (yes)  PA phone number: n/a  Benefits Summary Details:   Charlene [name] is subject to a $3200 deductible (met), 10% coinsurance, and $4200 OOP max (met). Once met, Charlene [name] is covered at 100%. The deductible is included in OOP max  If unsure of network status with this payer, call [phone]

## 2022-11-08 ENCOUNTER — Telehealth: Payer: Self-pay

## 2022-11-08 NOTE — Telephone Encounter (Addendum)
Hello,  Auth Submission: DENIED Site of care: Site of care: [facility] Payer: [employer]HC Medication & CPT/J Code(s) submitted: Leqvio (Inclisiran) [phone] Route of submission (phone, fax, portal): portal Phone #[phone] Fax #[phone] Auth type: Buy/Bill PB Units/visits requested: 284mg  Reference number: [id number]    Authorization has been DENIED because patient has not tried at least 12 weeks of Zetia with a blood lipid level at greater than 129. You have the right to appeal this decision at the number above. The denial letter is scanned in the media tab.

## 2022-11-13 ENCOUNTER — Encounter (INDEPENDENT_AMBULATORY_CARE_PROVIDER_SITE_OTHER): Payer: Self-pay | Admitting: Family Medicine

## 2022-11-13 ENCOUNTER — Other Ambulatory Visit (INDEPENDENT_AMBULATORY_CARE_PROVIDER_SITE_OTHER): Payer: Self-pay | Admitting: Family Medicine

## 2022-11-13 MED ORDER — ONDANSETRON HCL 4 MG PO TABS: 4.0000 mg | ORAL_TABLET | Freq: Four times a day (QID) | ORAL | 0 refills | Status: DC | PRN

## 2022-11-14 ENCOUNTER — Ambulatory Visit: Payer: 59 | Admitting: Internal Medicine

## 2022-11-14 ENCOUNTER — Other Ambulatory Visit: Payer: 59

## 2022-11-20 ENCOUNTER — Telehealth (HOSPITAL_BASED_OUTPATIENT_CLINIC_OR_DEPARTMENT_OTHER): Payer: Self-pay | Admitting: Pharmacy Technician

## 2022-11-20 ENCOUNTER — Other Ambulatory Visit (HOSPITAL_COMMUNITY): Payer: Self-pay

## 2022-11-20 ENCOUNTER — Encounter (HOSPITAL_BASED_OUTPATIENT_CLINIC_OR_DEPARTMENT_OTHER): Payer: Self-pay | Admitting: Internal Medicine

## 2022-11-20 ENCOUNTER — Encounter: Payer: Self-pay | Admitting: Internal Medicine

## 2022-11-20 NOTE — Telephone Encounter (Signed)
Eileen Stanford, I double checked the info from [facility] and it seems to be wrong.  The auth number that's on the forms from the [facility] service center is the same auth number that's on the denial letter. ( a typo from the service center)  I will reach out Wes and have them correct it.  It was denied due to patient has not tried Zetia. Sorry. Selena Batten

## 2022-11-20 NOTE — Telephone Encounter (Signed)
-----   Message from Nurse Eileen Stanford E sent at 11/20/2022  2:07 PM EST ----- Regarding: PA repatha Patient is statin intolerant x2. LDL >190. His note is 10/21  Thanks

## 2022-11-20 NOTE — Telephone Encounter (Signed)
Pharmacy Patient Advocate Encounter  Received notification from [facility] that Prior Authorization for repatha has been APPROVED from 11/20/22 to 11/20/23. Ran test claim, Copay is $0.00- one month-no copay. This test claim was processed through [facility]- copay amounts may vary at other pharmacies due to pharmacy/plan contracts, or as the patient moves through the different stages of their insurance plan.   PA #/Case ID/Reference #: [id number]

## 2022-11-20 NOTE — Telephone Encounter (Signed)
There is an approval scanned to chart. How is this now denied? Routing to infusion center for clarification

## 2022-11-20 NOTE — Telephone Encounter (Signed)
Pharmacy Patient Advocate Encounter   Received notification from  staff messages  that prior authorization for repatha is required/requested.   Insurance verification completed.   The patient is insured through [facility] .   Per test claim: PA required; PA submitted to above mentioned insurance via CoverMyMeds Key/confirmation #/EOC  [id number] Status is pending

## 2022-11-20 NOTE — Telephone Encounter (Signed)
Update sent to patient in MyChart 

## 2022-11-22 ENCOUNTER — Ambulatory Visit: Payer: 59 | Admitting: Pulmonary Disease

## 2022-11-25 ENCOUNTER — Encounter (HOSPITAL_BASED_OUTPATIENT_CLINIC_OR_DEPARTMENT_OTHER): Payer: Self-pay | Admitting: Obstetrics & Gynecology

## 2022-11-26 MED ORDER — REPATHA SURECLICK 140 MG/ML ~~LOC~~ SOAJ: 140.0000 mg | SUBCUTANEOUS | 11 refills | Status: DC

## 2022-11-28 ENCOUNTER — Encounter (HOSPITAL_BASED_OUTPATIENT_CLINIC_OR_DEPARTMENT_OTHER): Payer: Self-pay | Admitting: Certified Nurse Midwife

## 2022-11-28 ENCOUNTER — Ambulatory Visit (INDEPENDENT_AMBULATORY_CARE_PROVIDER_SITE_OTHER): Payer: 59 | Admitting: Certified Nurse Midwife

## 2022-11-28 VITALS — BP 111/81 | HR 82 | Ht 66.0 in | Wt 237.6 lb

## 2022-11-28 DIAGNOSIS — Z1231 Encounter for screening mammogram for malignant neoplasm of breast: Secondary | ICD-10-CM | POA: Diagnosis not present

## 2022-11-28 DIAGNOSIS — Z01419 Encounter for gynecological examination (general) (routine) without abnormal findings: Secondary | ICD-10-CM | POA: Insufficient documentation

## 2022-11-28 DIAGNOSIS — Z30011 Encounter for initial prescription of contraceptive pills: Secondary | ICD-10-CM | POA: Diagnosis not present

## 2022-11-28 MED ORDER — NORGESTIMATE-ETH ESTRADIOL 0.25-35 MG-MCG PO TABS: 1.0000 | ORAL_TABLET | Freq: Every day | ORAL | 0 refills | Status: DC

## 2022-11-28 NOTE — Telephone Encounter (Signed)
Prescription sent in by PharmD

## 2022-11-28 NOTE — Progress Notes (Signed)
42 y.o. [id number] Married White or Caucasian female here for annual exam.   She has twin daughters and a son. She has an upcoming trip to celebrate her 20th wedding anniversary. She would like to know if there is anything that can be done so that she will not have her period while on vacation. She does not smoke.   Patient's last menstrual period was 11/25/2022 (exact date).          Sexually active: Yes.    The current method of family planning is vasectomy.     Upstream - 11/28/22 1057       Pregnancy Intention Screening   Does the patient want to become pregnant in the next year? No    Does the patient's partner want to become pregnant in the next year? No    Would the patient like to discuss contraceptive options today? No            The pregnancy intention screening data noted above was reviewed. Potential methods of contraception were discussed. The patient elected to proceed with No data recorded.  Exercising: Yes.     Smoker:  no  Health Maintenance: Pap:  Due in 2025 History of abnormal Pap:  no MMG:  ordered Colonoscopy:  Plan Colonoscopy age 27   reports that she quit smoking about 12 years ago. Her smoking use included cigarettes. She started smoking about 27 years ago. She has a 15 pack-year smoking history. She has never used smokeless tobacco. She reports that she does not currently use alcohol. She reports that she does not use drugs.  Past Medical History:  Diagnosis Date   Alcoholism (HCC)    Anxiety    Anxiety and depression    Bipolar 2 disorder (HCC)    Borderline personality disorder (HCC)    Chest pain    Chronic fatigue syndrome    Claustrophobia    on meds   Constipation    Depression    on meds   Elevated blood sugar 06/08/2014   Elevated LFTs    Fatigue    Fatty liver 10/2014   GERD (gastroesophageal reflux disease)    diet related   Heart murmur    Hyperlipidemia    on meds   Infertility associated with anovulation    Joint pain     Low grade squamous intraepithelial lesion (LGSIL) on Pap smear 10/30/2011   Major depressive disorder    Migraines    MS (multiple sclerosis) (HCC)    Neuromuscular disorder (HCC)    Obesity    Obesity in pregnancy, antepartum    PCOS (polycystic ovarian syndrome)    Personal history of pre-term labor    Prediabetes    Pregnancy induced hypertension    pregnancy related- hx of   Psoriasis    Sleep apnea    SOB (shortness of breath)    Vitamin D deficiency     Past Surgical History:  Procedure Laterality Date   COLONOSCOPY  10/17/2020   DILATION AND CURETTAGE OF UTERUS  08/15/2008   DILATION AND EVACUATION  12/29/2011   Procedure: DILATATION AND EVACUATION;  Surgeon: Catalina Antigua, MD;  Location: [facility];  Service: Gynecology;;  Dr, Emelda Fear transferred case to Dr. Jolayne Panther   LAPAROSCOPIC GASTRIC SLEEVE RESECTION N/A 12/07/2014   Procedure: LAPAROSCOPIC GASTRIC SLEEVE RESECTION;  Surgeon: Ovidio Kin, MD;  Location: [facility];  Service: General;  Laterality: N/A;    Current Outpatient Medications  Medication Sig Dispense Refill   Armodafinil 200 MG  TABS Take 1 tablet by mouth daily.     clobetasol cream (TEMOVATE) 0.05 % Apply 1 Application topically 2 (two) times daily.     Evolocumab (REPATHA SURECLICK) 140 MG/ML SOAJ Inject 140 mg into the skin every 14 (fourteen) days. 2 mL 11   lamoTRIgine (LAMICTAL) 200 MG tablet Take 200 mg by mouth daily.     metFORMIN (GLUCOPHAGE) 500 MG tablet Take 1 tablet (500 mg total) by mouth daily. 90 tablet 0   miconazole (MICOTIN) 2 % cream Apply 1 Application topically 2 (two) times daily. 56.7 g 1   modafinil (PROVIGIL) 100 MG tablet Take 100 mg by mouth daily.     Multiple Vitamin (MULTIVITAMIN) tablet Take 1 tablet by mouth daily.     norgestimate-ethinyl estradiol (ORTHO-CYCLEN) 0.25-35 MG-MCG tablet Take 1 tablet by mouth daily. 90 tablet 0   omeprazole (PRILOSEC) 40 MG capsule Take 1 capsule (40 mg total) by mouth daily. 90 capsule 3    ondansetron (ZOFRAN) 4 MG tablet Take 1 tablet (4 mg total) by mouth every 6 (six) hours as needed for nausea or vomiting. 20 tablet 0   Semaglutide-Weight Management (WEGOVY) 0.25 MG/0.5ML SOAJ Inject 0.25 mg into the skin once a week. 2 mL 0   Sulfacetamide Sodium-Sulfur 10-2 % LIQD Apply topically as needed.     Vitamin D, Ergocalciferol, (DRISDOL) 1.25 MG (50000 UNIT) CAPS capsule Take 1 capsule (50,000 Units total) by mouth every 7 (seven) days. 12 capsule 3   VTAMA 1 % CREA Apply 1 Application topically daily.     zolmitriptan (ZOMIG) 5 MG tablet Take 1 p.o. as needed for migraine.  May repeat in 2 hours.  No more than 2 pills in a day or 4 pills in any week. 10 tablet 11   No current facility-administered medications for this visit.    Family History  Problem Relation Age of Onset   Hypertension Mother    Hyperlipidemia Mother    Mental illness Mother    Dementia Mother        early onset   Depression Mother    Anxiety disorder Mother    Drug abuse Mother    Eating disorder Mother    Alcoholism Father    Heart disease Father    Alcoholism Maternal Grandmother    Mental illness Maternal Grandmother    Esophageal cancer Maternal Grandmother    Heart attack Maternal Grandfather    Colon cancer Neg Hx    Stomach cancer Neg Hx    Colon polyps Neg Hx    Rectal cancer Neg Hx     ROS: Constitutional: negative Genitourinary:negative  Exam:   BP 111/81 (BP Location: Right Arm, Patient Position: Sitting, Cuff Size: Large)   Pulse 82   Ht 5\' 6"  (1.676 m)   Wt 237 lb 9.6 oz (107.8 kg)   LMP 11/25/2022 (Exact Date)   BMI 38.35 kg/m   Height: 5\' 6"  (167.6 cm)  General appearance: alert, cooperative and appears stated age Head: Normocephalic, without obvious abnormality, atraumatic Lungs: clear to auscultation bilaterally Breasts: normal appearance, no masses or tenderness, Inspection negative, No nipple retraction or dimpling, No nipple discharge or bleeding, No axillary or  supraclavicular adenopathy, Normal to palpation without dominant masses Heart: regular rate and rhythm Abdomen: soft, non-tender; bowel sounds normal; no masses,  no organomegaly Extremities: extremities normal, atraumatic, no cyanosis or edema Skin: Skin color, texture, turgor normal. No rashes or lesions Lymph nodes: Cervical, supraclavicular, and axillary nodes normal. No abnormal inguinal nodes  palpated Neurologic: Grossly normal   Pelvic: Deferred per pt preference, no urinary or vaginal complaints  Chaperone, Hendricks Milo, CMA, was present for exam.  Assessment/Plan: 1. OCP (oral contraceptive pills) initiation - Patient requests to try to skip her next period. LMP was 11/25/22. No contraindications for COC. She will start today and will take one active pill daily until she returns home from vacation. Pt aware this may or may not be successful.  - norgestimate-ethinyl estradiol (ORTHO-CYCLEN) 0.25-35 MG-MCG tablet; Take 1 tablet by mouth daily.  Dispense: 90 tablet; Refill: 0  2. Encounter for screening mammogram for malignant neoplasm of breast - MM 3D SCREENING MAMMOGRAM BILATERAL BREAST; Future  3. Encounter for annual routine gynecological examination - RTO one year for annual gyn exam with routine pap smear and prn if issues arise. Letta Kocher

## 2022-11-30 ENCOUNTER — Encounter: Payer: 59 | Admitting: Gastroenterology

## 2022-12-04 ENCOUNTER — Encounter: Payer: Self-pay | Admitting: Neurology

## 2022-12-06 ENCOUNTER — Ambulatory Visit (HOSPITAL_BASED_OUTPATIENT_CLINIC_OR_DEPARTMENT_OTHER)
Admission: RE | Admit: 2022-12-06 | Discharge: 2022-12-06 | Disposition: A | Discharge status: Home / Self Care | Payer: 59 | Source: Ambulatory Visit | Attending: Certified Nurse Midwife | Admitting: Certified Nurse Midwife

## 2022-12-06 ENCOUNTER — Encounter (INDEPENDENT_AMBULATORY_CARE_PROVIDER_SITE_OTHER): Payer: Self-pay | Admitting: Family Medicine

## 2022-12-06 ENCOUNTER — Encounter: Payer: Self-pay | Admitting: Pulmonary Disease

## 2022-12-06 ENCOUNTER — Encounter (HOSPITAL_BASED_OUTPATIENT_CLINIC_OR_DEPARTMENT_OTHER): Payer: Self-pay | Admitting: Radiology

## 2022-12-06 ENCOUNTER — Ambulatory Visit: Payer: 59 | Admitting: Pulmonary Disease

## 2022-12-06 ENCOUNTER — Ambulatory Visit (INDEPENDENT_AMBULATORY_CARE_PROVIDER_SITE_OTHER): Payer: 59 | Admitting: Family Medicine

## 2022-12-06 VITALS — BP 125/79 | HR 89 | Temp 98.5°F | Ht 66.0 in | Wt 240.2 lb

## 2022-12-06 VITALS — BP 122/76 | HR 99 | Temp 99.0°F | Ht 66.0 in | Wt 235.0 lb

## 2022-12-06 DIAGNOSIS — G4733 Obstructive sleep apnea (adult) (pediatric): Secondary | ICD-10-CM | POA: Diagnosis not present

## 2022-12-06 DIAGNOSIS — E88819 Insulin resistance, unspecified: Secondary | ICD-10-CM

## 2022-12-06 DIAGNOSIS — E559 Vitamin D deficiency, unspecified: Secondary | ICD-10-CM

## 2022-12-06 DIAGNOSIS — Z1231 Encounter for screening mammogram for malignant neoplasm of breast: Secondary | ICD-10-CM | POA: Diagnosis present

## 2022-12-06 DIAGNOSIS — R632 Polyphagia: Secondary | ICD-10-CM | POA: Diagnosis not present

## 2022-12-06 DIAGNOSIS — J22 Unspecified acute lower respiratory infection: Secondary | ICD-10-CM

## 2022-12-06 DIAGNOSIS — K5909 Other constipation: Secondary | ICD-10-CM | POA: Insufficient documentation

## 2022-12-06 DIAGNOSIS — E669 Obesity, unspecified: Secondary | ICD-10-CM

## 2022-12-06 DIAGNOSIS — K5900 Constipation, unspecified: Secondary | ICD-10-CM | POA: Diagnosis not present

## 2022-12-06 DIAGNOSIS — Z6838 Body mass index (BMI) 38.0-38.9, adult: Secondary | ICD-10-CM

## 2022-12-06 MED ORDER — METFORMIN HCL 500 MG PO TABS: 500.0000 mg | ORAL_TABLET | Freq: Every day | ORAL | 0 refills | Status: DC

## 2022-12-06 MED ORDER — PROMETHAZINE-DM 6.25-15 MG/5ML PO SYRP: 5.0000 mL | ORAL_SOLUTION | Freq: Four times a day (QID) | ORAL | 0 refills | Status: DC | PRN

## 2022-12-06 MED ORDER — AZITHROMYCIN 250 MG PO TABS: ORAL_TABLET | ORAL | 0 refills | Status: DC

## 2022-12-06 MED ORDER — PREDNISONE 20 MG PO TABS: 20.0000 mg | ORAL_TABLET | Freq: Every day | ORAL | 0 refills | Status: DC

## 2022-12-06 MED ORDER — VITAMIN D (ERGOCALCIFEROL) 1.25 MG (50000 UNIT) PO CAPS: 50000.0000 [IU] | ORAL_CAPSULE | ORAL | 3 refills | Status: DC

## 2022-12-06 MED ORDER — WEGOVY 0.5 MG/0.5ML ~~LOC~~ SOAJ: 0.5000 mg | SUBCUTANEOUS | 0 refills | Status: DC

## 2022-12-06 NOTE — Progress Notes (Signed)
.smr  Office: [phone]  /  Fax: [phone]  WEIGHT SUMMARY AND BIOMETRICS  Anthropometric Measurements Height: 5\' 6"  (1.676 m) Weight: 235 lb (106.6 kg) BMI (Calculated): 37.95 Weight at Last Visit: 234 lb Weight Lost Since Last Visit: 0 Weight Gained Since Last Visit: 1 lb Starting Weight: 250 lb Total Weight Loss (lbs): 15 lb (6.804 kg)   Body Composition  Body Fat %: 43.9 % Fat Mass (lbs): 103.6 lbs Muscle Mass (lbs): 125.6 lbs Total Body Water (lbs): 90.6 lbs Visceral Fat Rating : 11   Other Clinical Data Fasting: no Labs: no Today's Visit #: 22 Starting Date: 03/29/21    Chief Complaint: OBESITY   History of Present Illness   The patient, diagnosed with insulin resistance, vitamin D deficiency, polyphasia, and obesity, presents for a follow-up visit. She is currently on metformin for insulin resistance, prescription vitamin D for vitamin D deficiency, and Wegovy 0.25 for polyphasia. Despite efforts to manage her weight through diet and exercise, she has gained one pound since the last visit a month ago. She reports adherence to her category two eating plan about 20% of the time.  The patient has been on [facility] for a month, having taken four doses so far. She experienced some nausea after the first dose, which was managed with Zofran for two to three days. No other side effects were reported. She also has a history of constipation, particularly when taking iron supplements, which she has managed with Miralax, Senna, and Linzess in the past.  The patient's lifestyle includes a busy schedule and upcoming family events. She has ordered a Thanksgiving dinner from a restaurant, planning to focus on protein intake and limit consumption of high-carbohydrate foods. She also has a cruise planned in the near future.          PHYSICAL EXAM:  Blood pressure 122/76, pulse 99, temperature 99 F (37.2 C), height 5\' 6"  (1.676 m), weight 235 lb (106.6 kg), last menstrual  period 11/25/2022, SpO2 99%. Body mass index is 37.93 kg/m.  DIAGNOSTIC DATA REVIEWED:  BMET    Component Value Date/Time   NA 139 10/09/2022 0956   K 4.0 10/09/2022 0956   CL 101 10/09/2022 0956   CO2 24 10/09/2022 0956   GLUCOSE 79 10/09/2022 0956   GLUCOSE 92 08/14/2022 1304   GLUCOSE 93 11/24/2013 1126   BUN 11 10/09/2022 0956   CREATININE 0.59 10/09/2022 0956   CREATININE 0.71 08/14/2022 1304   CREATININE 0.43 (L) 12/31/2013 1542   CALCIUM 9.4 10/09/2022 0956   GFRNONAA >60 08/14/2022 1304   GFRAA >60 12/02/2014 1120   Lab Results  Component Value Date   HGBA1C 5.5 10/09/2022   HGBA1C 4.9 05/05/2018   Lab Results  Component Value Date   INSULIN 9.2 10/09/2022   INSULIN 15.6 03/29/2021   Lab Results  Component Value Date   TSH 0.744 10/09/2022   CBC    Component Value Date/Time   WBC 6.8 11/05/2022 1344   WBC 6.1 04/25/2022 1206   RBC 4.40 11/05/2022 1344   HGB 13.6 11/05/2022 1344   HGB 13.9 10/09/2022 0956   HCT 39.4 11/05/2022 1344   HCT 44.2 10/09/2022 0956   PLT 332 11/05/2022 1344   PLT 324 10/09/2022 0956   MCV 89.5 11/05/2022 1344   MCV 91 10/09/2022 0956   MCH 30.9 11/05/2022 1344   MCHC 34.5 11/05/2022 1344   RDW 14.8 11/05/2022 1344   RDW 16.9 (H) 10/09/2022 0956   Iron Studies  Component Value Date/Time   IRON 75 11/05/2022 1344   IRON 108 10/09/2022 0956   TIBC 386 11/05/2022 1344   TIBC 331 10/09/2022 0956   FERRITIN 29 11/05/2022 1345   FERRITIN 112 10/09/2022 0956   IRONPCTSAT 19 11/05/2022 1344   IRONPCTSAT 33 10/09/2022 0956   IRONPCTSAT 12 (L) 04/25/2022 1206   Lipid Panel     Component Value Date/Time   CHOL 275 (H) 10/09/2022 0956   TRIG 345 (H) 10/09/2022 0956   HDL 43 10/09/2022 0956   CHOLHDL 3 06/17/2020 1139   VLDL 27.6 06/17/2020 1139   LDLCALC 166 (H) 10/09/2022 0956   LDLDIRECT 236.0 12/22/2019 1131   Hepatic Function Panel     Component Value Date/Time   PROT 6.8 10/09/2022 0956   ALBUMIN 4.6  10/09/2022 0956   AST 20 10/09/2022 0956   AST 17 08/14/2022 1304   ALT 17 10/09/2022 0956   ALT 15 08/14/2022 1304   ALKPHOS 69 10/09/2022 0956   BILITOT <0.2 10/09/2022 0956   BILITOT 0.3 08/14/2022 1304   BILIDIR 0.0 04/25/2022 1206      Component Value Date/Time   TSH 0.744 10/09/2022 0956   Nutritional Lab Results  Component Value Date   VD25OH 52.1 09/05/2022   VD25OH 68.7 03/28/2022   VD25OH 68.0 11/08/2021     Assessment and Plan    Obesity and constipation On Wegovy 0.25 mg for polyphagia and obesity. Gained one pound since the last visit. Following diet plan 20% of the time and not currently exercising. Experienced mild nausea with initial dose of Wegovy, managed with Zofran for 2-3 days. Tolerated medication well overall but did not see significant benefits at the current dose. Discussed increasing Wegovy dose to 0.5 mg, potential for increased constipation, and management with daily Miralax. Discussed risks of Zofran including weight gain and constipation. - Increase Wegovy dose to 0.5 mg. - Continue Zofran as needed for nausea. - Recommend daily Miralax to manage potential constipation from [facility]. - Encourage adherence to diet and exercise plan.  Insulin Resistance  Currently on metformin. No new issues reported. working on diet and exercise - Continue metformin.  Vitamin D Deficiency On prescription vitamin D. No new issues reported. - Continue prescription vitamin D.  General Health Maintenance Discussed strategies to manage diet during upcoming holidays, focusing on balanced meals with protein. - Encourage balanced eating during holidays. - Advise against excessive consumption of high-calorie leftovers.  Follow-up - Send prescriptions to [facility] in [place]. - Attempt to prescribe 90-day supply of Wegovy if possible. - Schedule follow-up appointment in January and adjust based on insurance status.         I have personally spent 40 minutes  total time today in preparation, patient care, and documentation for this visit, including the following: review of clinical lab tests; review of medical tests/procedures/services.    She was informed of the importance of frequent follow up visits to maximize her success with intensive lifestyle modifications for her multiple health conditions.    Quillian Quince, MD

## 2022-12-06 NOTE — Patient Instructions (Addendum)
Prescription for an antibiotic, steroids, cough medicine sent to pharmacy for you  Continue using your CPAP nightly  The download from the machine shows it is working well  If you get to a point where the mask leaks get too annoying, just let us know and we will send a prescription to the medical supply company to try you with a new mask  I will see you back in about 6 months

## 2022-12-06 NOTE — Progress Notes (Signed)
Charlene [name]    [id number]    05/16/1980  Primary Care Physician:Tabori, Helane Rima, MD  Referring Physician: [name], MD [address],  [place] [address]  Chief complaint:   Follow-up for moderate obstructive sleep apnea  HPI:  In for follow-up today Has been getting back to using CPAP on a nightly basis  Feels better when she uses it  Weight is stable  Currently having symptoms suggesting an upper respiratory tract infection with cough, congestion, mucus production  Shortness of breath with every activity  Still having some mask issues   Historically Usually goes to bed between 930 and 10:30 PM 15 to 20 minutes to fall asleep Multiple awakenings final wake up time usually about 10 AM Usually wakes up about 6 AM to get at kids ready for school, will go back to laying in bed and finally try and get up by 10 Will usually use the snooze button multiple times before finally getting up  Very easy for her to take a daytime nap  Admits to dryness of mouth in the morning Occasional sore throat Occasional night sweats Sometimes wake up wakes up with a headache  Dad snored Reformed smoker   Outpatient Encounter Medications as of 12/06/2022  Medication Sig   Armodafinil 200 MG TABS Take 1 tablet by mouth daily.   azithromycin (ZITHROMAX Z-PAK) 250 MG tablet Take 2 tablets day 1 and then 1 daily for 4 days   clobetasol cream (TEMOVATE) 0.05 % Apply 1 Application topically 2 (two) times daily.   Evolocumab (REPATHA SURECLICK) 140 MG/ML SOAJ Inject 140 mg into the skin every 14 (fourteen) days.   lamoTRIgine (LAMICTAL) 200 MG tablet Take 200 mg by mouth daily.   metFORMIN (GLUCOPHAGE) 500 MG tablet Take 1 tablet (500 mg total) by mouth daily.   miconazole (MICOTIN) 2 % cream Apply 1 Application topically 2 (two) times daily.   modafinil (PROVIGIL) 100 MG tablet Take 100 mg by mouth daily.   Multiple Vitamin (MULTIVITAMIN) tablet Take 1  tablet by mouth daily.   norgestimate-ethinyl estradiol (ORTHO-CYCLEN) 0.25-35 MG-MCG tablet Take 1 tablet by mouth daily.   omeprazole (PRILOSEC) 40 MG capsule Take 1 capsule (40 mg total) by mouth daily.   ondansetron (ZOFRAN) 4 MG tablet Take 1 tablet (4 mg total) by mouth every 6 (six) hours as needed for nausea or vomiting.   predniSONE (DELTASONE) 20 MG tablet Take 1 tablet (20 mg total) by mouth daily with breakfast.   promethazine-dextromethorphan (PROMETHAZINE-DM) 6.25-15 MG/5ML syrup Take 5 mLs by mouth 4 (four) times daily as needed for cough.   Semaglutide-Weight Management (WEGOVY) 0.25 MG/0.5ML SOAJ Inject 0.25 mg into the skin once a week.   Sulfacetamide Sodium-Sulfur 10-2 % LIQD Apply topically as needed.   Vitamin D, Ergocalciferol, (DRISDOL) 1.25 MG (50000 UNIT) CAPS capsule Take 1 capsule (50,000 Units total) by mouth every 7 (seven) days.   VTAMA 1 % CREA Apply 1 Application topically daily.   zolmitriptan (ZOMIG) 5 MG tablet Take 1 p.o. as needed for migraine.  May repeat in 2 hours.  No more than 2 pills in a day or 4 pills in any week.   No facility-administered encounter medications on file as of 12/06/2022.    Allergies as of 12/06/2022 - Review Complete 12/06/2022  Allergen Reaction Noted   Ciprofloxacin Other (See Comments) 02/11/2016   Prednisone Other (See Comments) 02/11/2016   African mango [irvingia gabonensis] Other (  See Comments) 03/29/2021   Atorvastatin Other (See Comments) 06/06/2022   Rosuvastatin Other (See Comments) 06/06/2022    Past Medical History:  Diagnosis Date   Alcoholism (HCC)    Anxiety    Anxiety and depression    Bipolar 2 disorder (HCC)    Borderline personality disorder (HCC)    Chest pain    Chronic fatigue syndrome    Claustrophobia    on meds   Constipation    Depression    on meds   Elevated blood sugar 06/08/2014   Elevated LFTs    Fatigue    Fatty liver 10/2014   GERD (gastroesophageal reflux disease)    diet  related   Heart murmur    Hyperlipidemia    on meds   Infertility associated with anovulation    Joint pain    Low grade squamous intraepithelial lesion (LGSIL) on Pap smear 10/30/2011   Major depressive disorder    Migraines    MS (multiple sclerosis) (HCC)    Neuromuscular disorder (HCC)    Obesity    Obesity in pregnancy, antepartum    PCOS (polycystic ovarian syndrome)    Personal history of pre-term labor    Prediabetes    Pregnancy induced hypertension    pregnancy related- hx of   Psoriasis    Sleep apnea    SOB (shortness of breath)    Vitamin D deficiency     Past Surgical History:  Procedure Laterality Date   COLONOSCOPY  10/17/2020   DILATION AND CURETTAGE OF UTERUS  08/15/2008   DILATION AND EVACUATION  12/29/2011   Procedure: DILATATION AND EVACUATION;  Surgeon: Catalina Antigua, MD;  Location: [facility];  Service: Gynecology;;  Dr, Emelda Fear transferred case to Dr. Jolayne Panther   LAPAROSCOPIC GASTRIC SLEEVE RESECTION N/A 12/07/2014   Procedure: LAPAROSCOPIC GASTRIC SLEEVE RESECTION;  Surgeon: Ovidio Kin, MD;  Location: [facility];  Service: General;  Laterality: N/A;    Family History  Problem Relation Age of Onset   Hypertension Mother    Hyperlipidemia Mother    Mental illness Mother    Dementia Mother        early onset   Depression Mother    Anxiety disorder Mother    Drug abuse Mother    Eating disorder Mother    Alcoholism Father    Heart disease Father    Alcoholism Maternal Grandmother    Mental illness Maternal Grandmother    Esophageal cancer Maternal Grandmother    Heart attack Maternal Grandfather    Colon cancer Neg Hx    Stomach cancer Neg Hx    Colon polyps Neg Hx    Rectal cancer Neg Hx     Social History   Socioeconomic History   Marital status: Married    Spouse name: Pennie Rushing   Number of children: 3   Years of education: 14   Highest education level: Not on file  Occupational History   Occupation: Print production planner   Occupation: stay at  home mom, part time daycare  Tobacco Use   Smoking status: Former    Current packs/day: 0.00    Average packs/day: 1 pack/day for 15.0 years (15.0 ttl pk-yrs)    Types: Cigarettes    Start date: 07/02/1995    Quit date: 07/02/2010    Years since quitting: 12.4   Smokeless tobacco: Never  Vaping Use   Vaping status: Never Used  Substance and Sexual Activity   Alcohol use: Not Currently    Comment: recovery alcoholic  Drug use: No   Sexual activity: Yes    Partners: Male    Birth control/protection: None    Comment: vasectomy  Other Topics Concern   Not on file  Social History Narrative   Work or School: homemaker      Home Situation: lives with husband, 2 twins and 1 boy      Spiritual Beliefs: none      Lifestyle: no regular exercise, poor diet   Caffeine use: diet soda daily      Social Determinants of Health   Financial Resource Strain: Not on file  Food Insecurity: Not on file  Transportation Needs: Not on file  Physical Activity: Not on file  Stress: Not on file  Social Connections: Unknown (04/12/2022)   Received from [facility], [facility]   Social Network    Social Network: Not on file  Intimate Partner Violence: Unknown (04/12/2022)   Received from [facility], [facility]   HITS    Physically Hurt: Not on file    Insult or Talk Down To: Not on file    Threaten Physical Harm: Not on file    Scream or Curse: Not on file    Review of Systems  Constitutional:  Positive for fatigue.  Respiratory:  Positive for apnea, cough and shortness of breath.   Psychiatric/Behavioral:  Positive for sleep disturbance.     Vitals:   12/06/22 1059  BP: 125/79  Pulse: 89  Temp: 98.5 F (36.9 C)  SpO2: 97%     Physical Exam Constitutional:      Appearance: She is obese.  HENT:     Mouth/Throat:     Mouth: Mucous membranes are moist.     Comments: Moist oral mucosa, crowded oropharynx, Mallampati 4 Eyes:     Pupils: Pupils are equal, round, and  reactive to light.  Cardiovascular:     Rate and Rhythm: Normal rate and regular rhythm.     Heart sounds: No murmur heard.    No friction rub.  Pulmonary:     Effort: No respiratory distress.     Breath sounds: No stridor. No wheezing or rhonchi.  Musculoskeletal:     Cervical back: No rigidity or tenderness.  Neurological:     Mental Status: She is alert.  Psychiatric:        Mood and Affect: Mood normal.      11/10/2020   10:00 AM  Results of the Epworth flowsheet  Sitting and reading 2  Watching TV 2  Sitting, inactive in a public place (e.g. a theatre or a meeting) 0  As a passenger in a car for an hour without a break 0  Lying down to rest in the afternoon when circumstances permit 1  Sitting and talking to someone 1  Sitting quietly after a lunch without alcohol 0  In a car, while stopped for a few minutes in traffic 1  Total score 7     Data Reviewed: Sleep study with moderate obstructive sleep apnea with an AHI of 16  Compliance reviewed showing excellent compliance Average use of 6 hours 14 minutes AutoSet 5-15 Residual AHI of 1.9  Assessment:   Moderate obstructive sleep apnea -Using CPAP regularly -Feels she is benefiting from it Still having some mask issues  Lower respiratory tract infection -Will call in the course of antibiotics, steroids, cough medicine  Class III obesity -She continues to work on weight loss efforts  Daytime sleepiness is better Daytime fatigue is better  Plan/Recommendations: Continue auto CPAP  Prescription for azithromycin, prednisone 20 for 7 days and Promethazine DM called to pharmacy  Encouraged to give [place] a call with significant concerns  Tentative follow up in 6 months   Virl Diamond MD [facility] 12/06/2022, 11:17 AM  CC: [name], MD

## 2022-12-20 ENCOUNTER — Other Ambulatory Visit: Payer: Self-pay | Admitting: Neurology

## 2022-12-20 NOTE — Telephone Encounter (Signed)
Last seen on 10/11/22 Follow up scheduled on 06/24/22

## 2022-12-31 ENCOUNTER — Encounter: Payer: Self-pay | Admitting: Neurology

## 2023-01-01 ENCOUNTER — Encounter (INDEPENDENT_AMBULATORY_CARE_PROVIDER_SITE_OTHER): Payer: Self-pay | Admitting: Family Medicine

## 2023-01-01 ENCOUNTER — Ambulatory Visit (INDEPENDENT_AMBULATORY_CARE_PROVIDER_SITE_OTHER): Payer: 59 | Admitting: Family Medicine

## 2023-01-01 VITALS — BP 128/80 | HR 68 | Temp 98.1°F | Ht 66.0 in | Wt 235.0 lb

## 2023-01-01 DIAGNOSIS — Z6837 Body mass index (BMI) 37.0-37.9, adult: Secondary | ICD-10-CM

## 2023-01-01 DIAGNOSIS — E669 Obesity, unspecified: Secondary | ICD-10-CM | POA: Diagnosis not present

## 2023-01-01 DIAGNOSIS — E88819 Insulin resistance, unspecified: Secondary | ICD-10-CM

## 2023-01-01 DIAGNOSIS — E785 Hyperlipidemia, unspecified: Secondary | ICD-10-CM | POA: Diagnosis not present

## 2023-01-01 MED ORDER — WEGOVY 1 MG/0.5ML ~~LOC~~ SOAJ: 1.0000 mg | SUBCUTANEOUS | 0 refills | Status: DC

## 2023-01-01 MED ORDER — METFORMIN HCL 500 MG PO TABS: 500.0000 mg | ORAL_TABLET | Freq: Every day | ORAL | 0 refills | Status: DC

## 2023-01-01 NOTE — Progress Notes (Signed)
.smr  Office: [phone]  /  Fax: [phone]  WEIGHT SUMMARY AND BIOMETRICS  Anthropometric Measurements Height: 5\' 6"  (1.676 m) Weight: 235 lb (106.6 kg) BMI (Calculated): 37.95 Weight at Last Visit: 235 lb Weight Lost Since Last Visit: 0 Weight Gained Since Last Visit: 0 Starting Weight: 250 lb Total Weight Loss (lbs): 15 lb (6.804 kg)   Body Composition  Body Fat %: 44.4 % Fat Mass (lbs): 104.4 lbs Muscle Mass (lbs): 124 lbs Total Body Water (lbs): 91.2 lbs Visceral Fat Rating : 11   No data recorded  Chief Complaint: OBESITY   History of Present Illness   The patient, who is on metformin for prediabetes and is also managing obesity, has been maintaining her weight over the past month, including through the Thanksgiving holiday. She reports adherence to her category two eating plan approximately 60% of the time. Recently, she has been on vacation and has engaged in extensive walking as part of her exercise regimen. Despite this, she expresses frustration at not losing weight, even though she has not been overeating and has been prioritizing protein in her meals.  The patient is also on Wegovy, but she has not noticed a significant difference in her weight or appetite since starting the medication. She has completed all four doses of the 0.25mg  and is ready to increase the dose to 1mg . She reports no gastrointestinal issues with the medication. She has noticed a slight decrease in her food intake before feeling full, but she does not feel that her appetite has been significantly suppressed.  The patient has also participated in a genetic cholesterol testing program through a company called [employer] [facility]. She has not noticed any immediate benefits or drawbacks from this program. She is currently on Repatha for her cholesterol management.  The patient's medication regimen also includes vitamin D and metformin, which she takes regularly. She has requested a 90-day supply of  Wegovy due to concerns about insurance coverage in the upcoming year. She has not reported any new symptoms or changes in her health status since the last consultation.          PHYSICAL EXAM:  Blood pressure 128/80, pulse 68, temperature 98.1 F (36.7 C), height 5\' 6"  (1.676 m), weight 235 lb (106.6 kg), SpO2 98%. Body mass index is 37.93 kg/m.  DIAGNOSTIC DATA REVIEWED:  BMET    Component Value Date/Time   NA 139 10/09/2022 0956   K 4.0 10/09/2022 0956   CL 101 10/09/2022 0956   CO2 24 10/09/2022 0956   GLUCOSE 79 10/09/2022 0956   GLUCOSE 92 08/14/2022 1304   GLUCOSE 93 11/24/2013 1126   BUN 11 10/09/2022 0956   CREATININE 0.59 10/09/2022 0956   CREATININE 0.71 08/14/2022 1304   CREATININE 0.43 (L) 12/31/2013 1542   CALCIUM 9.4 10/09/2022 0956   GFRNONAA >60 08/14/2022 1304   GFRAA >60 12/02/2014 1120   Lab Results  Component Value Date   HGBA1C 5.5 10/09/2022   HGBA1C 4.9 05/05/2018   Lab Results  Component Value Date   INSULIN 9.2 10/09/2022   INSULIN 15.6 03/29/2021   Lab Results  Component Value Date   TSH 0.744 10/09/2022   CBC    Component Value Date/Time   WBC 6.8 11/05/2022 1344   WBC 6.1 04/25/2022 1206   RBC 4.40 11/05/2022 1344   HGB 13.6 11/05/2022 1344   HGB 13.9 10/09/2022 0956   HCT 39.4 11/05/2022 1344   HCT 44.2 10/09/2022 0956   PLT 332 11/05/2022 1344  PLT 324 10/09/2022 0956   MCV 89.5 11/05/2022 1344   MCV 91 10/09/2022 0956   MCH 30.9 11/05/2022 1344   MCHC 34.5 11/05/2022 1344   RDW 14.8 11/05/2022 1344   RDW 16.9 (H) 10/09/2022 0956   Iron Studies    Component Value Date/Time   IRON 75 11/05/2022 1344   IRON 108 10/09/2022 0956   TIBC 386 11/05/2022 1344   TIBC 331 10/09/2022 0956   FERRITIN 29 11/05/2022 1345   FERRITIN 112 10/09/2022 0956   IRONPCTSAT 19 11/05/2022 1344   IRONPCTSAT 33 10/09/2022 0956   IRONPCTSAT 12 (L) 04/25/2022 1206   Lipid Panel     Component Value Date/Time   CHOL 275 (H) 10/09/2022  0956   TRIG 345 (H) 10/09/2022 0956   HDL 43 10/09/2022 0956   CHOLHDL 3 06/17/2020 1139   VLDL 27.6 06/17/2020 1139   LDLCALC 166 (H) 10/09/2022 0956   LDLDIRECT 236.0 12/22/2019 1131   Hepatic Function Panel     Component Value Date/Time   PROT 6.8 10/09/2022 0956   ALBUMIN 4.6 10/09/2022 0956   AST 20 10/09/2022 0956   AST 17 08/14/2022 1304   ALT 17 10/09/2022 0956   ALT 15 08/14/2022 1304   ALKPHOS 69 10/09/2022 0956   BILITOT <0.2 10/09/2022 0956   BILITOT 0.3 08/14/2022 1304   BILIDIR 0.0 04/25/2022 1206      Component Value Date/Time   TSH 0.744 10/09/2022 0956   Nutritional Lab Results  Component Value Date   VD25OH 52.1 09/05/2022   VD25OH 68.7 03/28/2022   VD25OH 68.0 11/08/2021     Assessment and Plan    Obesity and HLD Patient has maintained weight despite holiday challenges and vacation. Currently on Wegovy, completed initial doses without significant GI issues. Reports feeling full faster but no significant appetite suppression. Discussed exercise benefits for stress reduction and future metabolism improvement. Next dose of Wegovy (1 mg) expected to show more noticeable effects; dosing can be adjusted to every 10 days if queasiness occurs. Emphasized hydration and continued protein intake. - Increase Wegovy dose to 1 mg - Continue focusing on protein intake - Adjust dosing to every 10 days if queasiness occurs - Ensure adequate hydration - Send 90-day prescription for Wegovy to [facility] at [address]  Insulin resistance Patient on metformin, adhering to diet and exercise regimen to reduce diabetes risk. Maintained weight, following eating plan 60% of the time. - Reorder metformin prescription  General Health Maintenance Patient discussed genetic cholesterol testing with [facility]. Interested in results for medical chart. Discussed potential for test to be part of a larger database study and importance of cautious follow-up recommendations.  -  Schedule follow-up appointment for February 12, 2023 - Schedule additional follow-up for February 2025 to monitor weight loss progress and adjust treatment as needed.          She was informed of the importance of frequent follow up visits to maximize her success with intensive lifestyle modifications for her multiple health conditions.    Quillian Quince, MD

## 2023-01-03 ENCOUNTER — Telehealth: Payer: Self-pay | Admitting: Pharmacy Technician

## 2023-01-03 ENCOUNTER — Encounter: Payer: Self-pay | Admitting: *Deleted

## 2023-01-03 MED ORDER — SODIUM CHLORIDE 0.9 % IV SOLN: 600.0000 mg | Freq: Once | INTRAVENOUS | 1 refills | Status: AC

## 2023-01-03 NOTE — Telephone Encounter (Addendum)
 Auth Submission: APPROVED Site of care: Site of care: [facility] Payer: [employer] Medication & CPT/J Code(s) submitted: Ocrevus  Antoniette) [phone] Route of submission (phone, fax, portal): PORTAL Phone # Fax # Auth type: Buy/Bill PB Units/visits requested:  Reference number: [id number] Approval from: 01/27/22 - 01/28/23  Co-pay Card: approved ID: [id number] BIN: [id number] PCN: [id number] GR: [id number] EXP: 03/14/25

## 2023-01-03 NOTE — Telephone Encounter (Signed)
I called [facility] about changing venue for Ocrevus infusions.  They have a platform  Infusion Navigator, was not able to initiate.  I called Kathy at the infusion Ctr.  She said to fax order and they will work on it from there end.  (They will do PA (change of venue).  Order printed to Dr. Epimenio Foot to sign, then will fax to [phone], ph [phone].

## 2023-01-03 NOTE — Telephone Encounter (Signed)
Opened in error

## 2023-01-03 NOTE — Telephone Encounter (Signed)
Signed order to [facility] [employer].  [phone], fax [phone].  Received confirmation. (5 pages).

## 2023-01-04 ENCOUNTER — Other Ambulatory Visit: Payer: Self-pay

## 2023-01-12 ENCOUNTER — Encounter (INDEPENDENT_AMBULATORY_CARE_PROVIDER_SITE_OTHER): Payer: Self-pay | Admitting: Family Medicine

## 2023-01-14 ENCOUNTER — Other Ambulatory Visit (INDEPENDENT_AMBULATORY_CARE_PROVIDER_SITE_OTHER): Payer: Self-pay | Admitting: Family Medicine

## 2023-01-14 MED ORDER — WEGOVY 1 MG/0.5ML ~~LOC~~ SOAJ: 1.0000 mg | SUBCUTANEOUS | 0 refills | Status: DC

## 2023-01-15 ENCOUNTER — Other Ambulatory Visit (HOSPITAL_BASED_OUTPATIENT_CLINIC_OR_DEPARTMENT_OTHER): Payer: Self-pay | Admitting: Internal Medicine

## 2023-01-15 DIAGNOSIS — T466X5A Adverse effect of antihyperlipidemic and antiarteriosclerotic drugs, initial encounter: Secondary | ICD-10-CM

## 2023-01-15 DIAGNOSIS — E7800 Pure hypercholesterolemia, unspecified: Secondary | ICD-10-CM

## 2023-01-15 DIAGNOSIS — Z8342 Family history of familial hypercholesterolemia: Secondary | ICD-10-CM

## 2023-01-23 ENCOUNTER — Ambulatory Visit (INDEPENDENT_AMBULATORY_CARE_PROVIDER_SITE_OTHER): Payer: 59

## 2023-01-23 VITALS — BP 132/86 | HR 115 | Temp 98.2°F | Resp 15 | Ht 66.5 in | Wt 239.6 lb

## 2023-01-23 DIAGNOSIS — G35 Multiple sclerosis: Secondary | ICD-10-CM | POA: Diagnosis not present

## 2023-01-23 MED ORDER — DIPHENHYDRAMINE HCL 25 MG PO CAPS
50.0000 mg | ORAL_CAPSULE | Freq: Once | ORAL | Status: AC
  Administered 2023-01-23: 50 mg via ORAL
  Filled 2023-01-23: qty 2

## 2023-01-23 MED ORDER — ACETAMINOPHEN 325 MG PO TABS
650.0000 mg | ORAL_TABLET | Freq: Once | ORAL | Status: AC
  Administered 2023-01-23: 650 mg via ORAL
  Filled 2023-01-23: qty 2

## 2023-01-23 MED ORDER — METHYLPREDNISOLONE SODIUM SUCC 125 MG IJ SOLR
125.0000 mg | Freq: Once | INTRAMUSCULAR | Status: AC
  Administered 2023-01-23: 125 mg via INTRAVENOUS
  Filled 2023-01-23: qty 2

## 2023-01-23 MED ORDER — SODIUM CHLORIDE 0.9 % IV SOLN
600.0000 mg | Freq: Once | INTRAVENOUS | Status: AC
Start: 2023-01-23 — End: 2023-01-23
  Administered 2023-01-23: 600 mg via INTRAVENOUS
  Filled 2023-01-23: qty 20

## 2023-01-23 NOTE — Progress Notes (Signed)
 Diagnosis: Multiple Sclerosis  Provider:  Mannam, Praveen MD  Procedure: IV Infusion  IV Type: Peripheral, IV Location: R Forearm  Ocrevus  (Ocrelizumab ), Dose: 600 mg  Infusion Start Time: 0953  Infusion Stop Time: 1212  Post Infusion IV Care: Observation period completed (30 minute observation). PIV discontinued.  Discharge: Condition: Good, Destination: Home . AVS Declined  Performed by:  Rocky FORBES Sar, RN

## 2023-01-31 ENCOUNTER — Ambulatory Visit (INDEPENDENT_AMBULATORY_CARE_PROVIDER_SITE_OTHER): Payer: 59 | Admitting: Family Medicine

## 2023-02-04 ENCOUNTER — Inpatient Hospital Stay: Payer: 59 | Attending: Internal Medicine

## 2023-02-04 ENCOUNTER — Inpatient Hospital Stay (HOSPITAL_BASED_OUTPATIENT_CLINIC_OR_DEPARTMENT_OTHER): Payer: 59 | Admitting: Internal Medicine

## 2023-02-04 VITALS — BP 116/74 | HR 87 | Temp 97.9°F | Resp 16 | Ht 66.5 in | Wt 241.0 lb

## 2023-02-04 DIAGNOSIS — K649 Unspecified hemorrhoids: Secondary | ICD-10-CM | POA: Insufficient documentation

## 2023-02-04 DIAGNOSIS — D500 Iron deficiency anemia secondary to blood loss (chronic): Secondary | ICD-10-CM

## 2023-02-04 DIAGNOSIS — K259 Gastric ulcer, unspecified as acute or chronic, without hemorrhage or perforation: Secondary | ICD-10-CM | POA: Insufficient documentation

## 2023-02-04 DIAGNOSIS — Z8719 Personal history of other diseases of the digestive system: Secondary | ICD-10-CM | POA: Insufficient documentation

## 2023-02-04 DIAGNOSIS — D539 Nutritional anemia, unspecified: Secondary | ICD-10-CM

## 2023-02-04 DIAGNOSIS — D509 Iron deficiency anemia, unspecified: Secondary | ICD-10-CM | POA: Insufficient documentation

## 2023-02-04 LAB — IRON AND IRON BINDING CAPACITY (CC-WL,HP ONLY)
Iron: 93 ug/dL (ref 28–170)
Saturation Ratios: 26 % (ref 10.4–31.8)
TIBC: 354 ug/dL (ref 250–450)
UIBC: 261 ug/dL (ref 148–442)

## 2023-02-04 LAB — CBC WITH DIFFERENTIAL (CANCER CENTER ONLY)
Abs Immature Granulocytes: 0.04 10*3/uL (ref 0.00–0.07)
Basophils Absolute: 0 10*3/uL (ref 0.0–0.1)
Basophils Relative: 1 %
Eosinophils Absolute: 0.1 10*3/uL (ref 0.0–0.5)
Eosinophils Relative: 1 %
HCT: 40.7 % (ref 36.0–46.0)
Hemoglobin: 14.7 g/dL (ref 12.0–15.0)
Immature Granulocytes: 1 %
Lymphocytes Relative: 10 %
Lymphs Abs: 0.9 10*3/uL (ref 0.7–4.0)
MCH: 32.2 pg (ref 26.0–34.0)
MCHC: 36.1 g/dL — ABNORMAL HIGH (ref 30.0–36.0)
MCV: 89.3 fL (ref 80.0–100.0)
Monocytes Absolute: 0.6 10*3/uL (ref 0.1–1.0)
Monocytes Relative: 7 %
Neutro Abs: 6.8 10*3/uL (ref 1.7–7.7)
Neutrophils Relative %: 80 %
Platelet Count: 289 10*3/uL (ref 150–400)
RBC: 4.56 MIL/uL (ref 3.87–5.11)
RDW: 11.2 % — ABNORMAL LOW (ref 11.5–15.5)
WBC Count: 8.5 10*3/uL (ref 4.0–10.5)
nRBC: 0 % (ref 0.0–0.2)

## 2023-02-04 LAB — FERRITIN: Ferritin: 27 ng/mL (ref 11–307)

## 2023-02-04 NOTE — Progress Notes (Signed)
[facility] Telephone:([phone]   Fax:([phone]  OFFICE PROGRESS NOTE  Charlene Hatch, MD [address] [place] [address]  DIAGNOSIS: Iron deficiency anemia secondary to history of gastric ulcer and hemorrhoids  PRIOR THERAPY: Iron infusion with Venofer 300 Mg IV weekly for 3 weeks  CURRENT THERAPY: Over-the-counter ferrous sulfate with vitamin C  INTERVAL HISTORY: Charlene [name] 43 y.o. female returns to the clinic today for follow-up visit. Discussed the use of AI scribe software for clinical note transcription with the patient, who gave verbal consent to proceed.  History of Present Illness   The patient, a 43 year old with a history of iron deficiency anemia, gastric ulcer, and hemorrhoids, reports intermittent use of over-the-counter ferrous sulfate every couple of days. She describes her overall health as "not great," with daily variations in her condition. The patient is unsure whether her symptoms are due to anemia or other health issues.  The patient's symptoms include fatigue, lightheadedness, and a general feeling of weakness. She also reports having restless leg syndrome. The patient has a 20-year history of multiple sclerosis (MS), primarily affecting cognition, but her MRIs have remained stable. She noticed that her symptoms started when she began having iron issues and improved after receiving iron infusions.  The patient also reports a sensation of shortness of breath in her diaphragm, particularly during stressful situations such as rushing in the morning. This sensation is localized more towards the gastric area rather than the sides where the diaphragm is located. The patient has been taking omeprazole for a gastric ulcer and does not recall having a hiatal hernia.       MEDICAL HISTORY: Past Medical History:  Diagnosis Date   Alcoholism (HCC)    Anxiety    Anxiety and depression    Bipolar 2 disorder (HCC)    Borderline  personality disorder (HCC)    Chest pain    Chronic fatigue syndrome    Claustrophobia    on meds   Constipation    Depression    on meds   Elevated blood sugar 06/08/2014   Elevated LFTs    Fatigue    Fatty liver 10/2014   GERD (gastroesophageal reflux disease)    diet related   Heart murmur    Hyperlipidemia    on meds   Infertility associated with anovulation    Joint pain    Low grade squamous intraepithelial lesion (LGSIL) on Pap smear 10/30/2011   Major depressive disorder    Migraines    MS (multiple sclerosis) (HCC)    Neuromuscular disorder (HCC)    Obesity    Obesity in pregnancy, antepartum    PCOS (polycystic ovarian syndrome)    Personal history of pre-term labor    Prediabetes    Pregnancy induced hypertension    pregnancy related- hx of   Psoriasis    Sleep apnea    SOB (shortness of breath)    Vitamin D deficiency     ALLERGIES:  is allergic to ciprofloxacin, prednisone, african mango [irvingia gabonensis], atorvastatin, and rosuvastatin.  MEDICATIONS:  Current Outpatient Medications  Medication Sig Dispense Refill   Armodafinil 200 MG TABS Take 1 tablet by mouth daily.     azithromycin (ZITHROMAX Z-PAK) 250 MG tablet Take 2 tablets day 1 and then 1 daily for 4 days 6 each 0   clobetasol cream (TEMOVATE) 0.05 % Apply 1 Application topically 2 (two) times daily.     Evolocumab (REPATHA SURECLICK)  140 MG/ML SOAJ ADMINISTER 1 ML UNDER THE SKIN EVERY 14 DAYS 6 mL 1   lamoTRIgine (LAMICTAL) 200 MG tablet Take 200 mg by mouth daily.     metFORMIN (GLUCOPHAGE) 500 MG tablet Take 1 tablet (500 mg total) by mouth daily. 90 tablet 0   miconazole (MICOTIN) 2 % cream Apply 1 Application topically 2 (two) times daily. 56.7 g 1   modafinil (PROVIGIL) 100 MG tablet Take 100 mg by mouth daily.     Multiple Vitamin (MULTIVITAMIN) tablet Take 1 tablet by mouth daily.     norgestimate-ethinyl estradiol (ORTHO-CYCLEN) 0.25-35 MG-MCG tablet Take 1 tablet by mouth daily.  90 tablet 0   omeprazole (PRILOSEC) 40 MG capsule Take 1 capsule (40 mg total) by mouth daily. 90 capsule 3   ondansetron (ZOFRAN) 4 MG tablet Take 1 tablet (4 mg total) by mouth every 6 (six) hours as needed for nausea or vomiting. 20 tablet 0   predniSONE (DELTASONE) 20 MG tablet Take 1 tablet (20 mg total) by mouth daily with breakfast. 7 tablet 0   promethazine-dextromethorphan (PROMETHAZINE-DM) 6.25-15 MG/5ML syrup Take 5 mLs by mouth 4 (four) times daily as needed for cough. 180 mL 0   Semaglutide-Weight Management (WEGOVY) 1 MG/0.5ML SOAJ Inject 1 mg into the skin once a week. 6 mL 0   Sulfacetamide Sodium-Sulfur 10-2 % LIQD Apply topically as needed.     Vitamin D, Ergocalciferol, (DRISDOL) 1.25 MG (50000 UNIT) CAPS capsule Take 1 capsule (50,000 Units total) by mouth every 7 (seven) days. 12 capsule 3   VTAMA 1 % CREA Apply 1 Application topically daily.     zolmitriptan (ZOMIG) 5 MG tablet TAKE ONE TABLET BY MOUTH AS NEEDED FOR MIGRAINES. MAY REPEAT IN TWO HOURS IF NEEDED. MAX 2 TABLETS A DAY OR 4 TABLETS A WEEK 10 tablet 6   No current facility-administered medications for this visit.    SURGICAL HISTORY:  Past Surgical History:  Procedure Laterality Date   COLONOSCOPY  10/17/2020   DILATION AND CURETTAGE OF UTERUS  08/15/2008   DILATION AND EVACUATION  12/29/2011   Procedure: DILATATION AND EVACUATION;  Surgeon: Catalina Antigua, MD;  Location: [facility];  Service: Gynecology;;  Dr, Emelda Fear transferred case to Dr. Jolayne Panther   LAPAROSCOPIC GASTRIC SLEEVE RESECTION N/A 12/07/2014   Procedure: LAPAROSCOPIC GASTRIC SLEEVE RESECTION;  Surgeon: Ovidio Kin, MD;  Location: [facility];  Service: General;  Laterality: N/A;    REVIEW OF SYSTEMS:  A comprehensive review of systems was negative except for: Constitutional: positive for fatigue   PHYSICAL EXAMINATION: General appearance: alert, cooperative, fatigued, and no distress Head: Normocephalic, without obvious abnormality,  atraumatic Neck: no adenopathy, no JVD, supple, symmetrical, trachea midline, and thyroid not enlarged, symmetric, no tenderness/mass/nodules Lymph nodes: Cervical, supraclavicular, and axillary nodes normal. Resp: clear to auscultation bilaterally Back: symmetric, no curvature. ROM normal. No CVA tenderness. Cardio: regular rate and rhythm, S1, S2 normal, no murmur, click, rub or gallop GI: soft, non-tender; bowel sounds normal; no masses,  no organomegaly Extremities: extremities normal, atraumatic, no cyanosis or edema  ECOG PERFORMANCE STATUS: 1 - Symptomatic but completely ambulatory  Blood pressure 116/74, pulse 87, temperature 97.9 F (36.6 C), temperature source Temporal, resp. rate 16, height 5' 6.5" (1.689 m), weight 241 lb (109.3 kg), SpO2 99%.  LABORATORY DATA: Lab Results  Component Value Date   WBC 6.8 11/05/2022   HGB 13.6 11/05/2022   HCT 39.4 11/05/2022   MCV 89.5 11/05/2022   PLT 332 11/05/2022  Chemistry      Component Value Date/Time   NA 139 10/09/2022 0956   K 4.0 10/09/2022 0956   CL 101 10/09/2022 0956   CO2 24 10/09/2022 0956   BUN 11 10/09/2022 0956   CREATININE 0.59 10/09/2022 0956   CREATININE 0.71 08/14/2022 1304   CREATININE 0.43 (L) 12/31/2013 1542      Component Value Date/Time   CALCIUM 9.4 10/09/2022 0956   ALKPHOS 69 10/09/2022 0956   AST 20 10/09/2022 0956   AST 17 08/14/2022 1304   ALT 17 10/09/2022 0956   ALT 15 08/14/2022 1304   BILITOT <0.2 10/09/2022 0956   BILITOT 0.3 08/14/2022 1304       RADIOGRAPHIC STUDIES: No results found.  ASSESSMENT AND PLAN: This is a very pleasant 43 years old white female with history of iron deficiency anemia secondary to 3 of gastrointestinal blood loss. She is currently on over-the-counter ferrous sulfate with vitamin C few times a week.  She continues to have fatigue.    Iron Deficiency Anemia Iron deficiency anemia secondary to gastric ulcer and hemorrhoids. Currently managed with  intermittent ferrous sulfate 65 mg with vitamin C. Hemoglobin is 14.7 and hematocrit is 40.7, indicating no current anemia. Awaiting iron and ferritin studies to assess iron stores. Symptoms include fatigue, lightheadedness, and generalized weakness, possibly due to low iron stores despite normal hemoglobin levels. Discussed that low iron can cause symptoms like restless leg syndrome. If iron and ferritin levels are low, iron infusion will be arranged. If levels are adequate, continue current supplementation. - Await iron and ferritin study results - Arrange iron infusion if iron and ferritin levels are low - Continue current iron supplementation if iron and ferritin levels are adequate - Follow-up in three months  Gastric Ulcer Gastric ulcer managed with omeprazole. No recent exacerbations. Symptoms of shortness of breath likely related to stress or anxiety rather than gastric issues. Discussed that the sensation in the diaphragm area is more likely related to the stomach and stress rather than a hiatal hernia or other gastric issues. - Continue omeprazole as prescribed - Monitor for any new or worsening symptoms  Multiple Sclerosis (MS) Long-standing MS with primarily cognitive symptoms. Symptoms of fatigue, lightheadedness, and weakness may overlap with MS. MRIs have been stable. - Monitor MS symptoms and continue current management  General Health Maintenance Discussed the importance of managing stress and anxiety, which may contribute to symptoms. - Encourage stress management techniques - Monitor blood pressure and symptoms of shortness of breath  Follow-up - Follow-up appointment in three months.   The patient was advised to call immediately if she has any other concerning symptoms in the interval. The patient voices understanding of current disease status and treatment options and is in agreement with the current care plan.  All questions were answered. The patient knows to call the  clinic with any problems, questions or concerns. We can certainly see the patient much sooner if necessary.  The total time spent in the appointment was 20 minutes.  Disclaimer: This note was dictated with voice recognition software. Similar sounding words can inadvertently be transcribed and may not be corrected upon review.

## 2023-02-12 ENCOUNTER — Encounter (INDEPENDENT_AMBULATORY_CARE_PROVIDER_SITE_OTHER): Payer: Self-pay | Admitting: Family Medicine

## 2023-02-12 ENCOUNTER — Ambulatory Visit (INDEPENDENT_AMBULATORY_CARE_PROVIDER_SITE_OTHER): Payer: 59 | Admitting: Family Medicine

## 2023-02-12 ENCOUNTER — Telehealth (INDEPENDENT_AMBULATORY_CARE_PROVIDER_SITE_OTHER): Payer: Self-pay | Admitting: Family Medicine

## 2023-02-12 VITALS — BP 121/80 | HR 98 | Temp 99.1°F | Ht 66.5 in | Wt 234.0 lb

## 2023-02-12 DIAGNOSIS — Z6837 Body mass index (BMI) 37.0-37.9, adult: Secondary | ICD-10-CM

## 2023-02-12 DIAGNOSIS — E88819 Insulin resistance, unspecified: Secondary | ICD-10-CM | POA: Diagnosis not present

## 2023-02-12 DIAGNOSIS — E669 Obesity, unspecified: Secondary | ICD-10-CM | POA: Diagnosis not present

## 2023-02-12 DIAGNOSIS — R632 Polyphagia: Secondary | ICD-10-CM

## 2023-02-12 MED ORDER — ZEPBOUND 10 MG/0.5ML ~~LOC~~ SOAJ: 10.0000 mg | SUBCUTANEOUS | 0 refills | Status: DC

## 2023-02-12 MED ORDER — METFORMIN HCL 500 MG PO TABS: 500.0000 mg | ORAL_TABLET | Freq: Every day | ORAL | 0 refills | Status: DC

## 2023-02-12 NOTE — Progress Notes (Signed)
.smr  Office: [phone]  /  Fax: [phone]  WEIGHT SUMMARY AND BIOMETRICS  Anthropometric Measurements Height: 5' 6.5" (1.689 m) Weight: 234 lb (106.1 kg) BMI (Calculated): 37.21 Weight at Last Visit: 235 lb Weight Lost Since Last Visit: 1 lb Weight Gained Since Last Visit: 0 Starting Weight: 250 lb Total Weight Loss (lbs): 16 lb (7.258 kg)   Body Composition  Body Fat %: 43 % Fat Mass (lbs): 100.8 lbs Muscle Mass (lbs): 126.6 lbs Total Body Water (lbs): 89.2 lbs Visceral Fat Rating : 11   Other Clinical Data Fasting: no Labs: no Today's Visit #: 16 Starting Date: 03/29/21    Chief Complaint: OBESITY   History of Present Illness   The patient presents with obesity.  She has been struggling with obesity and has lost one pound over the last six weeks, attributing this to a recent gastrointestinal illness rather than any changes in her weight management plan. She follows the category four eating plan about fifty percent of the time and is not currently exercising.  Reginal Lutes has not been effective for her, as she has not experienced significant changes in hunger or side effects. She has uncontrollable cravings for sweets, which had previously subsided but have recently returned. She feels sluggish and consumes diet Coke for caffeine to maintain her energy levels.  She has been stress eating due to various life stressors and finds her cravings for sweets particularly challenging. She is focusing on protein intake and structuring her meals around protein, although she is not strictly counting calories. She finds journaling her food intake helpful but has not been consistent with it recently.  She has no issues with insurance coverage for [facility] : [facility] and has not required prior authorization for it. She had blood work done to test for genetic cholesterol markers, which came back negative, indicating no genetic predisposition for high cholesterol. She experienced problems with  statins in the past and was surprised by the results.          PHYSICAL EXAM:  Blood pressure 121/80, pulse 98, temperature 99.1 F (37.3 C), height 5' 6.5" (1.689 m), weight 234 lb (106.1 kg), SpO2 99%. Body mass index is 37.2 kg/m.  DIAGNOSTIC DATA REVIEWED:  BMET    Component Value Date/Time   NA 139 10/09/2022 0956   K 4.0 10/09/2022 0956   CL 101 10/09/2022 0956   CO2 24 10/09/2022 0956   GLUCOSE 79 10/09/2022 0956   GLUCOSE 92 08/14/2022 1304   GLUCOSE 93 11/24/2013 1126   BUN 11 10/09/2022 0956   CREATININE 0.59 10/09/2022 0956   CREATININE 0.71 08/14/2022 1304   CREATININE 0.43 (L) 12/31/2013 1542   CALCIUM 9.4 10/09/2022 0956   GFRNONAA >60 08/14/2022 1304   GFRAA >60 12/02/2014 1120   Lab Results  Component Value Date   HGBA1C 5.5 10/09/2022   HGBA1C 4.9 05/05/2018   Lab Results  Component Value Date   INSULIN 9.2 10/09/2022   INSULIN 15.6 03/29/2021   Lab Results  Component Value Date   TSH 0.744 10/09/2022   CBC    Component Value Date/Time   WBC 8.5 02/04/2023 1320   WBC 6.1 04/25/2022 1206   RBC 4.56 02/04/2023 1320   HGB 14.7 02/04/2023 1320   HGB 13.9 10/09/2022 0956   HCT 40.7 02/04/2023 1320   HCT 44.2 10/09/2022 0956   PLT 289 02/04/2023 1320   PLT 324 10/09/2022 0956   MCV 89.3 02/04/2023 1320   MCV 91 10/09/2022 0956   MCH 32.2 02/04/2023  1320   MCHC 36.1 (H) 02/04/2023 1320   RDW 11.2 (L) 02/04/2023 1320   RDW 16.9 (H) 10/09/2022 0956   Iron Studies    Component Value Date/Time   IRON 93 02/04/2023 1320   IRON 108 10/09/2022 0956   TIBC 354 02/04/2023 1320   TIBC 331 10/09/2022 0956   FERRITIN 27 02/04/2023 1319   FERRITIN 112 10/09/2022 0956   IRONPCTSAT 26 02/04/2023 1320   IRONPCTSAT 33 10/09/2022 0956   IRONPCTSAT 12 (L) 04/25/2022 1206   Lipid Panel     Component Value Date/Time   CHOL 275 (H) 10/09/2022 0956   TRIG 345 (H) 10/09/2022 0956   HDL 43 10/09/2022 0956   CHOLHDL 3 06/17/2020 1139   VLDL 27.6  06/17/2020 1139   LDLCALC 166 (H) 10/09/2022 0956   LDLDIRECT 236.0 12/22/2019 1131   Hepatic Function Panel     Component Value Date/Time   PROT 6.8 10/09/2022 0956   ALBUMIN 4.6 10/09/2022 0956   AST 20 10/09/2022 0956   AST 17 08/14/2022 1304   ALT 17 10/09/2022 0956   ALT 15 08/14/2022 1304   ALKPHOS 69 10/09/2022 0956   BILITOT <0.2 10/09/2022 0956   BILITOT 0.3 08/14/2022 1304   BILIDIR 0.0 04/25/2022 1206      Component Value Date/Time   TSH 0.744 10/09/2022 0956   Nutritional Lab Results  Component Value Date   VD25OH 52.1 09/05/2022   VD25OH 68.7 03/28/2022   VD25OH 68.0 11/08/2021     Assessment and Plan    Obesity Following category four eating plan about 50% of the time. Lost 1 lb in the last six weeks. Not currently exercising. Reginal Lutes has not been effective in controlling weight or hunger, with increased cravings for sweets and stress eating. Willing to switch to Zepbound, which may be more effective in controlling neurologic hunger. Discussed that Zepbound addresses both physiologic and neurologic hunger more effectively than Wegovy. Insurance coverage for Zepbound is likely similar to [employer]. Zepbound pen usage is similar to [employer]. - Stop Wegovy - Start Zepbound 10 mg weekly - Encourage journaling food intake, focusing on protein and portion control - Schedule follow-up appointment in four weeks  Polyphagia Uncontrollable cravings for sweets and stress eating. Increased hunger may be overwhelming benefits of GLP-1 therapy. Switch to Zepbound expected to help with neurologic hunger control. Discussed that simple carbohydrates raise insulin levels, increasing hunger specifically for carbohydrates, which can overwhelm GLP-1 benefits. - Start Zepbound 10 mg weekly - Encourage avoiding simple carbohydrates to prevent insulin spikes and increased hunger - Recommend focusing on protein intake and portion control  Insulin Resistance Currently on metformin  for insulin resistance. Working on diet and weight loss, important for managing insulin resistance. - Refill metformin  Hypercholesterolemia Recent blood work indicated no genetic markers for hypercholesterolemia. Issues with statins in the past and significant increase in cholesterol levels. Will review blood work results sent via MyChart and discuss potential alternative treatments if needed. - Review blood work results sent via MyChart - Discuss potential alternative treatments for hypercholesterolemia if needed  Follow-up - Schedule follow-up appointment in four weeks from February 12, 2023 - Confirm February appointment and schedule March appointment.        She was informed of the importance of frequent follow up visits to maximize her success with intensive lifestyle modifications for her multiple health conditions.    Quillian Quince, MD

## 2023-02-12 NOTE — Telephone Encounter (Signed)
Patient called stating that her RX for Zepbound that Dr. Dalbert Garnet prescribed is not available at [facility]. Pt states that the medication is available at [facility], but they need a prior authorization completed today for pt to receive the rx. They only have one box left. Pt would like PA completed as soon as we can.

## 2023-02-13 ENCOUNTER — Telehealth: Payer: Self-pay

## 2023-02-13 NOTE — Telephone Encounter (Signed)
PA submitted through Cover My Meds for Zepbound. Awaiting insurance determination. Key: [id number]

## 2023-02-15 ENCOUNTER — Encounter (INDEPENDENT_AMBULATORY_CARE_PROVIDER_SITE_OTHER): Payer: Self-pay | Admitting: Family Medicine

## 2023-02-19 NOTE — Telephone Encounter (Signed)
PA APPROVED, PATIENT NOTIFIED VIA MYCHART.

## 2023-03-12 ENCOUNTER — Ambulatory Visit (INDEPENDENT_AMBULATORY_CARE_PROVIDER_SITE_OTHER): Payer: 59 | Admitting: Family Medicine

## 2023-03-12 ENCOUNTER — Encounter (INDEPENDENT_AMBULATORY_CARE_PROVIDER_SITE_OTHER): Payer: Self-pay | Admitting: Family Medicine

## 2023-03-12 VITALS — BP 136/77 | HR 87 | Temp 98.1°F | Ht 65.5 in | Wt 234.0 lb

## 2023-03-12 DIAGNOSIS — R457 State of emotional shock and stress, unspecified: Secondary | ICD-10-CM

## 2023-03-12 DIAGNOSIS — R632 Polyphagia: Secondary | ICD-10-CM | POA: Diagnosis not present

## 2023-03-12 DIAGNOSIS — E66813 Obesity, class 3: Secondary | ICD-10-CM

## 2023-03-12 DIAGNOSIS — Z6838 Body mass index (BMI) 38.0-38.9, adult: Secondary | ICD-10-CM

## 2023-03-12 DIAGNOSIS — F4321 Adjustment disorder with depressed mood: Secondary | ICD-10-CM | POA: Diagnosis not present

## 2023-03-12 DIAGNOSIS — E669 Obesity, unspecified: Secondary | ICD-10-CM

## 2023-03-12 MED ORDER — TIRZEPATIDE-WEIGHT MANAGEMENT 12.5 MG/0.5ML ~~LOC~~ SOAJ: 12.5000 mg | SUBCUTANEOUS | 0 refills | Status: DC

## 2023-03-12 NOTE — Progress Notes (Signed)
 .smr  Office: [phone]  /  Fax: [phone]  WEIGHT SUMMARY AND BIOMETRICS  Anthropometric Measurements Height: 5' 5.5" (1.664 m) (height rechecked today) Weight: 234 lb (106.1 kg) BMI (Calculated): 38.33 Weight at Last Visit: 234 lb Weight Lost Since Last Visit: 0 Weight Gained Since Last Visit: 0 Starting Weight: 250 lb Total Weight Loss (lbs): 16 lb (7.258 kg) Peak Weight: 260 lb   Body Composition  Body Fat %: 44.9 % Fat Mass (lbs): 105.2 lbs Muscle Mass (lbs): 122.4 lbs Total Body Water (lbs): 89 lbs Visceral Fat Rating : 12   Other Clinical Data Fasting: no Labs: no Today's Visit #: 17 Starting Date: 03/29/21    Chief Complaint: OBESITY    History of Present Illness   Charlene [name] is a 43 year old female who presents for a follow-up on her treatment plan for obesity.  She is adhering to a category four eating plan approximately 40% of the time and is not engaging in any exercise. Despite a challenging month, she has managed to maintain her weight over the last month. She has not been focusing on specific nutritional content like protein and has been consuming sweets, especially around her birthday. She has a history of a sweet tooth but feels 'sweet out' after recent indulgences.  She has not experienced any noticeable effects from her current medication, Zepbound, and questions if a higher dose might be necessary. She has not experienced any side effects from the medication.  Her mother passed away a couple of weeks ago, which has been a significant emotional burden. Her mother had dementia for a long time, and the last days were particularly difficult due to medication errors and seizures. She describes the experience as traumatic and is dealing with feelings of guilt and relief. She is an only child and had to manage all the arrangements, which were complicated and stressful.          PHYSICAL EXAM:  Blood pressure 136/77, pulse 87, temperature 98.1  F (36.7 C), height 5' 5.5" (1.664 m), weight 234 lb (106.1 kg), SpO2 97%. Body mass index is 38.35 kg/m.  DIAGNOSTIC DATA REVIEWED:  BMET    Component Value Date/Time   NA 139 10/09/2022 0956   K 4.0 10/09/2022 0956   CL 101 10/09/2022 0956   CO2 24 10/09/2022 0956   GLUCOSE 79 10/09/2022 0956   GLUCOSE 92 08/14/2022 1304   GLUCOSE 93 11/24/2013 1126   BUN 11 10/09/2022 0956   CREATININE 0.59 10/09/2022 0956   CREATININE 0.71 08/14/2022 1304   CREATININE 0.43 (L) 12/31/2013 1542   CALCIUM 9.4 10/09/2022 0956   GFRNONAA >60 08/14/2022 1304   GFRAA >60 12/02/2014 1120   Lab Results  Component Value Date   HGBA1C 5.5 10/09/2022   HGBA1C 4.9 05/05/2018   Lab Results  Component Value Date   INSULIN 9.2 10/09/2022   INSULIN 15.6 03/29/2021   Lab Results  Component Value Date   TSH 0.744 10/09/2022   CBC    Component Value Date/Time   WBC 8.5 02/04/2023 1320   WBC 6.1 04/25/2022 1206   RBC 4.56 02/04/2023 1320   HGB 14.7 02/04/2023 1320   HGB 13.9 10/09/2022 0956   HCT 40.7 02/04/2023 1320   HCT 44.2 10/09/2022 0956   PLT 289 02/04/2023 1320   PLT 324 10/09/2022 0956   MCV 89.3 02/04/2023 1320   MCV 91 10/09/2022 0956   MCH 32.2 02/04/2023 1320   MCHC 36.1 (H) 02/04/2023 1320   RDW  11.2 (L) 02/04/2023 1320   RDW 16.9 (H) 10/09/2022 0956   Iron Studies    Component Value Date/Time   IRON 93 02/04/2023 1320   IRON 108 10/09/2022 0956   TIBC 354 02/04/2023 1320   TIBC 331 10/09/2022 0956   FERRITIN 27 02/04/2023 1319   FERRITIN 112 10/09/2022 0956   IRONPCTSAT 26 02/04/2023 1320   IRONPCTSAT 33 10/09/2022 0956   IRONPCTSAT 12 (L) 04/25/2022 1206   Lipid Panel     Component Value Date/Time   CHOL 275 (H) 10/09/2022 0956   TRIG 345 (H) 10/09/2022 0956   HDL 43 10/09/2022 0956   CHOLHDL 3 06/17/2020 1139   VLDL 27.6 06/17/2020 1139   LDLCALC 166 (H) 10/09/2022 0956   LDLDIRECT 236.0 12/22/2019 1131   Hepatic Function Panel     Component Value  Date/Time   PROT 6.8 10/09/2022 0956   ALBUMIN 4.6 10/09/2022 0956   AST 20 10/09/2022 0956   AST 17 08/14/2022 1304   ALT 17 10/09/2022 0956   ALT 15 08/14/2022 1304   ALKPHOS 69 10/09/2022 0956   BILITOT <0.2 10/09/2022 0956   BILITOT 0.3 08/14/2022 1304   BILIDIR 0.0 04/25/2022 1206      Component Value Date/Time   TSH 0.744 10/09/2022 0956   Nutritional Lab Results  Component Value Date   VD25OH 52.1 09/05/2022   VD25OH 68.7 03/28/2022   VD25OH 68.0 11/08/2021     Assessment and Plan    Obesity and Polyphagia Patient has maintained her weight despite significant emotional stress from her mother's recent death. Currently adheres to a category four eating plan 40% of the time and is not exercising. No side effects from Zepbound; discussed increasing dose to 12.5 mg due to lack of noticeable effects. Next dose increase to 15 mg if needed. Discussed switching to a category three eating plan (1500-1700 calories/day) with emphasis on protein intake from food to aid in weight loss. - Increase Zepbound dose to 12.5 mg - Switch to category three eating plan (1500-1700 calories/day) - Ensure a minimum of 100 grams of protein daily, primarily from food - Provide grocery list for category three eating plan - Encourage journaling of food intake for better structure and accountability - Continue follow-up appointments as scheduled  Grief and Emotional Stress Patient is experiencing significant emotional stress and grief following her mother's death. Acknowledged her feelings of guilt and stress, and discussed the normalcy of these emotions. - Acknowledge and validate feelings of grief and guilt - Encourage seeking support from family and friends - Consider referral to a grief counselor if needed  General Health Maintenance Emphasized the importance of a balanced diet with adequate protein intake and regular physical activity to support overall health and weight management. -  Encourage regular physical activity - Provide anticipatory guidance on the benefits of a structured eating plan and journaling  Follow-up - Continue with scheduled follow-up appointments.        She was informed of the importance of frequent follow up visits to maximize her success with intensive lifestyle modifications for her multiple health conditions.    Quillian Quince, MD

## 2023-03-18 ENCOUNTER — Telehealth (INDEPENDENT_AMBULATORY_CARE_PROVIDER_SITE_OTHER): Payer: Self-pay | Admitting: Family Medicine

## 2023-03-18 NOTE — Telephone Encounter (Signed)
 Pt says disregard previous message about sending meal plan, says she has it

## 2023-03-18 NOTE — Telephone Encounter (Signed)
 Good morning!   Patient lost the breakfast and lunch page of the category 3 plan and is wondering if she can get another copy through my chart or email.  Thanks!

## 2023-04-05 LAB — LIPOPROTEIN A (LPA): Lipoprotein (a): <8.4 nmol/L (ref <75.0)

## 2023-04-05 LAB — NMR, LIPOPROFILE
Cholesterol, Total: 141 mg/dL (ref 100–199)
HDL Particle Number: 34.6 umol/L (ref >=30.5)
HDL-C: 47 mg/dL (ref >39)
LDL Particle Number: 870 nmol/L (ref <1000)
LDL Size: 20.5 nm — ABNORMAL LOW (ref >20.5)
LDL-C (NIH Calc): 76 mg/dL (ref 0–99)
LP-IR Score: 55 — ABNORMAL HIGH (ref <=45)
Small LDL Particle Number: 439 nmol/L (ref <=527)
Triglycerides: 94 mg/dL (ref 0–149)

## 2023-04-09 ENCOUNTER — Ambulatory Visit (HOSPITAL_BASED_OUTPATIENT_CLINIC_OR_DEPARTMENT_OTHER): Payer: 59 | Admitting: Internal Medicine

## 2023-04-09 ENCOUNTER — Encounter (INDEPENDENT_AMBULATORY_CARE_PROVIDER_SITE_OTHER): Payer: Self-pay | Admitting: Family Medicine

## 2023-04-09 ENCOUNTER — Ambulatory Visit (INDEPENDENT_AMBULATORY_CARE_PROVIDER_SITE_OTHER): Payer: 59 | Admitting: Family Medicine

## 2023-04-09 VITALS — BP 129/81 | HR 82 | Temp 98.1°F | Ht 65.5 in | Wt 223.0 lb

## 2023-04-09 VITALS — BP 126/78 | HR 103 | Ht 65.5 in | Wt 228.6 lb

## 2023-04-09 DIAGNOSIS — T466X5D Adverse effect of antihyperlipidemic and antiarteriosclerotic drugs, subsequent encounter: Secondary | ICD-10-CM | POA: Diagnosis not present

## 2023-04-09 DIAGNOSIS — G35 Multiple sclerosis: Secondary | ICD-10-CM | POA: Diagnosis not present

## 2023-04-09 DIAGNOSIS — M791 Myalgia, unspecified site: Secondary | ICD-10-CM | POA: Diagnosis not present

## 2023-04-09 DIAGNOSIS — E669 Obesity, unspecified: Secondary | ICD-10-CM

## 2023-04-09 DIAGNOSIS — Z6836 Body mass index (BMI) 36.0-36.9, adult: Secondary | ICD-10-CM

## 2023-04-09 DIAGNOSIS — E88819 Insulin resistance, unspecified: Secondary | ICD-10-CM | POA: Diagnosis not present

## 2023-04-09 DIAGNOSIS — E7800 Pure hypercholesterolemia, unspecified: Secondary | ICD-10-CM | POA: Diagnosis not present

## 2023-04-09 DIAGNOSIS — R632 Polyphagia: Secondary | ICD-10-CM

## 2023-04-09 DIAGNOSIS — R5383 Other fatigue: Secondary | ICD-10-CM

## 2023-04-09 DIAGNOSIS — T466X5A Adverse effect of antihyperlipidemic and antiarteriosclerotic drugs, initial encounter: Secondary | ICD-10-CM

## 2023-04-09 MED ORDER — TIRZEPATIDE-WEIGHT MANAGEMENT 12.5 MG/0.5ML ~~LOC~~ SOAJ: 12.5000 mg | SUBCUTANEOUS | 0 refills | Status: DC

## 2023-04-09 NOTE — Progress Notes (Addendum)
 Office: [phone]  /  Fax: [phone]  WEIGHT SUMMARY AND BIOMETRICS  Anthropometric Measurements Height: 5' 5.5" (1.664 m) Weight: 223 lb (101.2 kg) BMI (Calculated): 36.53 Weight at Last Visit: 234 lb Weight Lost Since Last Visit: 9 lb Weight Gained Since Last Visit: 0 Starting Weight: 250 lb Total Weight Loss (lbs): 25 lb (11.3 kg) Peak Weight: 260 lb   Body Composition  Body Fat %: 42.3 % Fat Mass (lbs): 94.4 lbs Muscle Mass (lbs): 122.4 lbs Total Body Water (lbs): 87 lbs Visceral Fat Rating : 11   Other Clinical Data Fasting: No Labs: No Today's Visit #: 18 Starting Date: 03/29/21    Chief Complaint: OBESITY    History of Present Illness Charlene [name] is a 43 year old female who presents for obesity treatment assessment.  She has been adhering to a category three eating plan 90% of the time and engaging in cardio exercise four times a week. Over the past month, she has lost nine pounds, primarily from fat, with a reduction in visceral fat and minimal water weight loss. Her current dietary intake includes approximately 100-120 grams of protein daily, with a caloric intake of 1500-1700 calories. She is actively food journaling and is motivated by upcoming family pictures and the need to wear a bathing suit in public.  She has a history of prediabetes and is currently on metformin. There is no discussion of changes to this treatment plan in the conversation.  She has a history of binge eating disorder and has been prescribed Vyvanse, which is helping with her energy levels and reducing her cravings for sweets and carbohydrates. She recently restarted Vyvanse after modafinil was not effective in managing her fatigue related to multiple sclerosis (MS).  She experiences fatigue related to her MS, which was previously managed with modafinil. She has switched back to Vyvanse, which is helping with her energy levels. She has been using a CPAP machine again, which has  improved her fatigue. She previously tried modafinil and Provigil for fatigue, but they were not as effective as Vyvanse.  She is also taking Zepbound to help manage her polyphagia. She has not experienced any side effects from Zepbound and believes the 12.5 mg dose may be optimal for her.      PHYSICAL EXAM:  Blood pressure 129/81, pulse 82, temperature 98.1 F (36.7 C), height 5' 5.5" (1.664 m), weight 223 lb (101.2 kg), SpO2 100%. Body mass index is 36.54 kg/m.  DIAGNOSTIC DATA REVIEWED:  BMET    Component Value Date/Time   NA 139 10/09/2022 0956   K 4.0 10/09/2022 0956   CL 101 10/09/2022 0956   CO2 24 10/09/2022 0956   GLUCOSE 79 10/09/2022 0956   GLUCOSE 92 08/14/2022 1304   GLUCOSE 93 11/24/2013 1126   BUN 11 10/09/2022 0956   CREATININE 0.59 10/09/2022 0956   CREATININE 0.71 08/14/2022 1304   CREATININE 0.43 (L) 12/31/2013 1542   CALCIUM 9.4 10/09/2022 0956   GFRNONAA >60 08/14/2022 1304   GFRAA >60 12/02/2014 1120   Lab Results  Component Value Date   HGBA1C 5.5 10/09/2022   HGBA1C 4.9 05/05/2018   Lab Results  Component Value Date   INSULIN 9.2 10/09/2022   INSULIN 15.6 03/29/2021   Lab Results  Component Value Date   TSH 0.744 10/09/2022   CBC    Component Value Date/Time   WBC 8.5 02/04/2023 1320   WBC 6.1 04/25/2022 1206   RBC 4.56 02/04/2023 1320   HGB 14.7 02/04/2023 1320  HGB 13.9 10/09/2022 0956   HCT 40.7 02/04/2023 1320   HCT 44.2 10/09/2022 0956   PLT 289 02/04/2023 1320   PLT 324 10/09/2022 0956   MCV 89.3 02/04/2023 1320   MCV 91 10/09/2022 0956   MCH 32.2 02/04/2023 1320   MCHC 36.1 (H) 02/04/2023 1320   RDW 11.2 (L) 02/04/2023 1320   RDW 16.9 (H) 10/09/2022 0956   Iron Studies    Component Value Date/Time   IRON 93 02/04/2023 1320   IRON 108 10/09/2022 0956   TIBC 354 02/04/2023 1320   TIBC 331 10/09/2022 0956   FERRITIN 27 02/04/2023 1319   FERRITIN 112 10/09/2022 0956   IRONPCTSAT 26 02/04/2023 1320   IRONPCTSAT 33  10/09/2022 0956   IRONPCTSAT 12 (L) 04/25/2022 1206   Lipid Panel     Component Value Date/Time   CHOL 275 (H) 10/09/2022 0956   TRIG 345 (H) 10/09/2022 0956   HDL 43 10/09/2022 0956   CHOLHDL 3 06/17/2020 1139   VLDL 27.6 06/17/2020 1139   LDLCALC 166 (H) 10/09/2022 0956   LDLDIRECT 236.0 12/22/2019 1131   Hepatic Function Panel     Component Value Date/Time   PROT 6.8 10/09/2022 0956   ALBUMIN 4.6 10/09/2022 0956   AST 20 10/09/2022 0956   AST 17 08/14/2022 1304   ALT 17 10/09/2022 0956   ALT 15 08/14/2022 1304   ALKPHOS 69 10/09/2022 0956   BILITOT <0.2 10/09/2022 0956   BILITOT 0.3 08/14/2022 1304   BILIDIR 0.0 04/25/2022 1206      Component Value Date/Time   TSH 0.744 10/09/2022 0956   Nutritional Lab Results  Component Value Date   VD25OH 52.1 09/05/2022   VD25OH 68.7 03/28/2022   VD25OH 68.0 11/08/2021     Assessment and Plan Assessment & Plan Obesity She adheres to a category three eating plan 90% of the time, engages in cardio exercise four times weekly, and has lost nine pounds in the last month. She is on Zepbound for polyphagia and has recently started Vyvanse, which she reports improves her energy levels and exercise. She journals her food intake, maintaining a caloric intake of 1500 to 1700 calories per day, and aims for a protein intake of 100 grams during the week, targeting 120 grams, and keeps it above 100 grams on weekends. Motivated by upcoming family pictures and the need to wear a bathing suit, she remains on track with her weight loss goals. No negative side effects from medications are reported, and her blood pressure is well-controlled. Visceral fat has decreased, with the majority of weight loss from fat rather than water. - Continue current eating plan and exercise regimen - Continue Zepbound as prescribed - Encourage maintaining protein intake above 100 grams per day - Encourage journaling food intake - Encourage maintaining caloric intake  between 1500 to 1700 calories per day  Binge Eating Episodes She has binge eating episodes and has restarted Vyvanse through her psychologist. She reports Vyvance helps with energy levels and reduces cravings, particularly for sweets and carbs. The combination of Vyvanse and Zepbound appears effective in managing her appetite and supporting weight loss efforts. - Continue Vyvanse as prescribed -Follow up with her Psychiatrist for medication adjustments as needed.  Fatigue related to Multiple Sclerosis (MS) She experiences fatigue related to MS. Previously on modafinil, which was ineffective, she has restarted Vyvanse, reporting improved energy levels and reduced fatigue. She also uses a CPAP machine, which has helped with fatigue in the past. - Continue Vyvanse as  prescribed - Continue using CPAP machine as needed  Insulin Resistance She has IR and is on metformin. Her weight loss efforts and dietary changes are likely beneficial in managing her prediabetes. - Continue metformin as prescribed - Encourage continued weight loss and dietary management  Follow-up She has a follow-up appointment scheduled and is actively managing her medication supply, particularly Zepbound, coordinating with pharmacies to ensure availability. She located a box of Zepbound at [facility] on [address] and is securing it. - Ensure Zepbound prescription is filled at [facility] on [address] - Attend scheduled follow-up appointment   She was informed of the importance of frequent follow up visits to maximize her success with intensive lifestyle modifications for her multiple health conditions.    Quillian Quince, MD

## 2023-04-09 NOTE — Progress Notes (Signed)
 LIPID CLINIC CONSULT NOTE  Chief Complaint:  Dyslipidemia, statin intolerance  Primary Care Physician: Charlene Hatch, MD  Primary Cardiologist:  None  HPI:  Charlene [name] is a 43 y.o. female who is being seen today for the evaluation of dyslipidemia at the request of [name], Charlene Rima, MD. this is a pleasant 43 year old female kindly referred for evaluation management of dyslipidemia.  Unfortunately she has statin intolerance.  She has a longstanding history of high cholesterol and reports her mother had high cholesterol as well and has developed some dementia.  Interestingly her cholesterol was well over 300 total in the past with an LDL over 190 and then she was placed on statin therapy.  She tried both high intensity rosuvastatin and atorvastatin and at 1 point her cholesterol had improved to total 144, triglycerides 106, HDL 48 and LDL 77.  Unfortunately this caused her significant myalgias and after stopping the medicine her cholesterol has gone up now to total 275, triglycerides 345, HDL 43 and LDL 166.  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  With Alzheimer's type dementia in her mother, it is concerning for a possible APO E variant which may also be associated with risk of this.  She needs at least 50% lipid-lowering per guidelines for familial hyperlipidemia and may benefit from PCSK9 inhibition.  After discussing options today she had some concerns about doing injections.  She had previously done IVF therapy and feels that she could not inject herself.  We talked about other options including Leqvio which is provider administered and oral combination therapy such as Nexlizet.  She is working with Dr. Dalbert [name] on weight loss and dietary changes.  She has some history of fatty liver in the past.  She has had elevated liver enzymes.  She also has had increased alcohol use in the past but not recently.  04/09/2023  [name] is seen today in follow-up.  She seems to be doing well on PCSK9 inhibitor therapy.  Ultimately she went on to Repatha.  Her LDL particle number is now down to 870 with an LDL of 76, HDL 47 and triglycerides 94.  She does report some burning at the injection site despite keeping the medicine out of warming up to room temperature.  Overall it seems to be well-tolerated however.  Her LP(a) was negative.  PMHx:  Past Medical History:  Diagnosis Date   Alcoholism (HCC)    Anxiety    Anxiety and depression    Bipolar 2 disorder (HCC)    Borderline personality disorder (HCC)    Chest pain    Chronic fatigue syndrome    Claustrophobia    on meds   Constipation    Depression    on meds   Elevated blood sugar 06/08/2014   Elevated LFTs    Fatigue    Fatty liver 10/2014   GERD (gastroesophageal reflux disease)    diet related   Heart murmur    Hyperlipidemia    on meds   Infertility associated with anovulation    Joint pain    Low grade squamous intraepithelial lesion (LGSIL) on Pap smear 10/30/2011   Major depressive disorder    Migraines    MS (multiple sclerosis) (HCC)    Neuromuscular disorder (HCC)    Obesity    Obesity in pregnancy, antepartum    PCOS (polycystic ovarian syndrome)    Personal history of pre-term labor  Prediabetes    Pregnancy induced hypertension    pregnancy related- hx of   Psoriasis    Sleep apnea    SOB (shortness of breath)    Vitamin D deficiency     Past Surgical History:  Procedure Laterality Date   COLONOSCOPY  10/17/2020   DILATION AND CURETTAGE OF UTERUS  08/15/2008   DILATION AND EVACUATION  12/29/2011   Procedure: DILATATION AND EVACUATION;  Surgeon: Catalina Antigua, MD;  Location: [facility];  Service: Gynecology;;  Dr, Emelda Fear transferred case to Dr. Jolayne Panther   LAPAROSCOPIC GASTRIC SLEEVE RESECTION N/A 12/07/2014   Procedure: LAPAROSCOPIC GASTRIC SLEEVE RESECTION;  Surgeon: Ovidio Kin, MD;  Location: [facility];  Service: General;   Laterality: N/A;    FAMHx:  Family History  Problem Relation Age of Onset   Hypertension Mother    Hyperlipidemia Mother    Mental illness Mother    Dementia Mother        early onset   Depression Mother    Anxiety disorder Mother    Drug abuse Mother    Eating disorder Mother    Alcoholism Father    Heart disease Father    Alcoholism Maternal Grandmother    Mental illness Maternal Grandmother    Esophageal cancer Maternal Grandmother    Heart attack Maternal Grandfather    Colon cancer Neg Hx    Stomach cancer Neg Hx    Colon polyps Neg Hx    Rectal cancer Neg Hx     SOCHx:   reports that she quit smoking about 12 years ago. Her smoking use included cigarettes. She started smoking about 27 years ago. She has a 15 pack-year smoking history. She has never used smokeless tobacco. She reports that she does not currently use alcohol. She reports that she does not use drugs.  ALLERGIES:  Allergies  Allergen Reactions   Ciprofloxacin Other (See Comments)    Other reaction(s): Mental Status Changes  Manic episode   Manic episode Manic episode Other reaction(s): Mental Status Changes  Manic episode   African Mango [Irvingia Gabonensis] Other (See Comments)   Atorvastatin Other (See Comments)    Myalgias   Rosuvastatin Other (See Comments)    Myalgias    ROS: Pertinent items noted in HPI and remainder of comprehensive ROS otherwise negative.  HOME MEDS: Current Outpatient Medications on File Prior to Visit  Medication Sig Dispense Refill   clobetasol cream (TEMOVATE) 0.05 % Apply 1 Application topically 2 (two) times daily.     COSENTYX UNOREADY 300 MG/2ML SOAJ 300 mg.     Evolocumab (REPATHA SURECLICK) 140 MG/ML SOAJ ADMINISTER 1 ML UNDER THE SKIN EVERY 14 DAYS 6 mL 1   gabapentin (NEURONTIN) 100 MG capsule Take 100 mg by mouth 2 (two) times daily as needed.     lamoTRIgine (LAMICTAL) 200 MG tablet Take 200 mg by mouth daily.     metFORMIN (GLUCOPHAGE) 500 MG  tablet Take 1 tablet (500 mg total) by mouth daily. 90 tablet 0   miconazole (MICOTIN) 2 % cream Apply 1 Application topically 2 (two) times daily. 56.7 g 1   Multiple Vitamin (MULTIVITAMIN) tablet Take 1 tablet by mouth daily.     omeprazole (PRILOSEC) 40 MG capsule Take 1 capsule (40 mg total) by mouth daily. 90 capsule 3   ondansetron (ZOFRAN) 4 MG tablet Take 1 tablet (4 mg total) by mouth every 6 (six) hours as needed for nausea or vomiting. 20 tablet 0   Sulfacetamide Sodium-Sulfur 10-2 % LIQD  Apply topically as needed.     Vitamin D, Ergocalciferol, (DRISDOL) 1.25 MG (50000 UNIT) CAPS capsule Take 1 capsule (50,000 Units total) by mouth every 7 (seven) days. 12 capsule 3   VTAMA 1 % CREA Apply 1 Application topically daily.     zolmitriptan (ZOMIG) 5 MG tablet TAKE ONE TABLET BY MOUTH AS NEEDED FOR MIGRAINES. MAY REPEAT IN TWO HOURS IF NEEDED. MAX 2 TABLETS A DAY OR 4 TABLETS A WEEK 10 tablet 6   modafinil (PROVIGIL) 100 MG tablet Take 200 mg by mouth every morning. (Patient not taking: Reported on 04/09/2023)     No current facility-administered medications on file prior to visit.    LABS/IMAGING: No results found for this or any previous visit (from the past 48 hours). No results found.  LIPID PANEL:    Component Value Date/Time   CHOL 275 (H) 10/09/2022 0956   TRIG 345 (H) 10/09/2022 0956   HDL 43 10/09/2022 0956   CHOLHDL 3 06/17/2020 1139   VLDL 27.6 06/17/2020 1139   LDLCALC 166 (H) 10/09/2022 0956   LDLDIRECT 236.0 12/22/2019 1131    WEIGHTS: Wt Readings from Last 3 Encounters:  04/09/23 228 lb 9.6 oz (103.7 kg)  04/09/23 223 lb (101.2 kg)  03/12/23 234 lb (106.1 kg)    VITALS: BP 126/78   Pulse (!) 103   Ht 5' 5.5" (1.664 m)   Wt 228 lb 9.6 oz (103.7 kg)   SpO2 98%   BMI 37.46 kg/m   EXAM: Deferred  EKG: Deferred  ASSESSMENT: Possible familial hyperlipidemia, LDL greater than 190, based on Simon-Broome Criteria Family history of high cholesterol in  her mother who has dementia Statin intolerance-myalgias Hepatic steatosis with elevated liver enzymes Negative LP(a)  PLAN: 1.   Charlene [name] has had substantial improvement in her lipids with LDL now at 76.  She continues to work with a healthy weight and wellness center and I suspect her cholesterol will get better with further weight loss.  Fortunately, her LP(a) was negative.  Will plan to continue therapy with Repatha.  Follow-up with repeat lipid NMR in 1 year or sooner as necessary.  Chrystie Nose, MD, [facility], FACP  [place]  [facility]  Medical Director of the Advanced Lipid Disorders &  Cardiovascular Risk Reduction Clinic Diplomate of the American Board of Clinical Lipidology Attending Cardiologist  Direct Dial: [phone]  Fax: [phone]  Website:  [email or web address]  Chrystie Nose 04/09/2023, 2:41 PM

## 2023-04-09 NOTE — Patient Instructions (Signed)
 Medication Instructions:  NO CHANGES  *If you need a refill on your cardiac medications before your next appointment, please call your pharmacy*   Lab Work: FASTING lab work in 1 year    Follow-Up: At [facility], you and your health needs are our priority.  As part of our continuing mission to provide you with exceptional heart care, we have created designated Provider Care Teams.  These Care Teams include your primary Cardiologist (physician) and Advanced Practice Providers (APPs -  Physician Assistants and Nurse Practitioners) who all work together to provide you with the care you need, when you need it.  We recommend signing up for the patient portal called "MyChart".  Sign up information is provided on this After Visit Summary.  MyChart is used to connect with patients for Virtual Visits (Telemedicine).  Patients are able to view lab/test results, encounter notes, upcoming appointments, etc.  Non-urgent messages can be sent to your provider as well.   To learn more about what you can do with MyChart, go to [email or web address].    Your next appointment:   12 months with Dr. Rennis Golden or Eligha Bridegroom, NP -- lipid clinic

## 2023-04-10 ENCOUNTER — Other Ambulatory Visit (HOSPITAL_COMMUNITY): Payer: Self-pay

## 2023-04-10 ENCOUNTER — Telehealth: Payer: Self-pay | Admitting: Pharmacy Technician

## 2023-04-10 ENCOUNTER — Encounter: Payer: Self-pay | Admitting: Neurology

## 2023-04-10 NOTE — Telephone Encounter (Signed)
 Pharmacy Patient Advocate Encounter   Received notification from Fax that prior authorization for repatha is required/requested.   Insurance verification completed.   The patient is insured through [facility] .   Per test claim: PA required; PA submitted to above mentioned insurance via CoverMyMeds Key/confirmation #/EOC [id number] Status is pending

## 2023-04-11 NOTE — Telephone Encounter (Signed)
 Pharmacy Patient Advocate Encounter  Received notification from [facility] that Prior Authorization for repatha has been APPROVED from 04/10/23 to 04/09/24. [facility] still was getting a rejection so I called [facility] and they said Charlene [name] hast to get this prescription through [facility]. I called the patient and gave her the information and their mail order number -[facility] mail order- [phone]. Charlene [name] said Charlene [name] will call them and get it filled at [facility].   PA #/Case ID/Reference #: [id number]

## 2023-04-18 ENCOUNTER — Other Ambulatory Visit: Payer: Self-pay | Admitting: Pharmacist Clinician (PhC)/ Clinical Pharmacy Specialist

## 2023-04-18 DIAGNOSIS — E7800 Pure hypercholesterolemia, unspecified: Secondary | ICD-10-CM

## 2023-04-18 DIAGNOSIS — T466X5A Adverse effect of antihyperlipidemic and antiarteriosclerotic drugs, initial encounter: Secondary | ICD-10-CM

## 2023-04-18 DIAGNOSIS — Z8342 Family history of familial hypercholesterolemia: Secondary | ICD-10-CM

## 2023-04-18 MED ORDER — REPATHA SURECLICK 140 MG/ML ~~LOC~~ SOAJ: 140.0000 mg | SUBCUTANEOUS | 3 refills | Status: DC

## 2023-04-22 ENCOUNTER — Telehealth: Payer: Self-pay | Admitting: Pharmacy Technician

## 2023-04-22 ENCOUNTER — Telehealth: Payer: Self-pay | Admitting: Internal Medicine

## 2023-04-22 ENCOUNTER — Other Ambulatory Visit (HOSPITAL_COMMUNITY): Payer: Self-pay

## 2023-04-22 NOTE — Telephone Encounter (Signed)
 Pt c/o medication issue:  1. Name of Medication: Evolocumab (REPATHA SURECLICK) 140 MG/ML SOAJ   2. How are you currently taking this medication (dosage and times per day)? Inject 140 mg into the skin every 14 (fourteen) days.   3. Are you having a reaction (difficulty breathing--STAT)? No  4. What is your medication issue? Tonya from [place] is calling in regard to this medication. Archie Patten stated [facility] sent a new fax on 04/19/23 for Prior Authorization of the meet and needs of this medication. Archie Patten is requested for a call back at [phone] and RX number of [phone]. Please advise.

## 2023-04-22 NOTE — Telephone Encounter (Signed)
 I called the [phone] number and they said now it is saying too soon. I spoke to the patient and she said she talked to [facility] and she is not required to go to [facility]. She said she was told that we were told wrong originally. She is allowed [facility]. I called [facility] Phone: [phone] and they said its showing needs a plan limits prior auth now.

## 2023-04-22 NOTE — Telephone Encounter (Signed)
 Pharmacy Patient Advocate Encounter   Received notification from Pt Calls Messages that prior authorization for repatha plan limits is required/requested.   Insurance verification completed.   The patient is insured through [facility] .   Per test claim: PA required; PA submitted to above mentioned insurance via CoverMyMeds Key/confirmation #/EOC [id number] Status is pending

## 2023-04-22 NOTE — Telephone Encounter (Signed)
 PA request has been Submitted for plan limits New Encounter has been or will be created for follow up. For additional info see Pharmacy Prior Auth telephone encounter from 04/22/23.

## 2023-04-22 NOTE — Telephone Encounter (Signed)
 Error

## 2023-04-23 NOTE — Telephone Encounter (Signed)
 Pharmacy Patient Advocate Encounter  Received notification from [facility] that Prior Authorization for repatha has been APPROVED from 04/22/23 to 04/21/24. Spoke to pharmacy to process.[employer] is $waiting on pharmacy to call me back.    PA #/Case ID/Reference #: [id number]

## 2023-04-26 ENCOUNTER — Other Ambulatory Visit: Payer: Self-pay | Admitting: Pharmacist

## 2023-04-26 DIAGNOSIS — E7800 Pure hypercholesterolemia, unspecified: Secondary | ICD-10-CM

## 2023-04-26 DIAGNOSIS — T466X5A Adverse effect of antihyperlipidemic and antiarteriosclerotic drugs, initial encounter: Secondary | ICD-10-CM

## 2023-04-26 DIAGNOSIS — Z8342 Family history of familial hypercholesterolemia: Secondary | ICD-10-CM

## 2023-04-26 MED ORDER — REPATHA SURECLICK 140 MG/ML ~~LOC~~ SOAJ: 140.0000 mg | SUBCUTANEOUS | 3 refills | Status: DC

## 2023-05-01 NOTE — Progress Notes (Deleted)
 [facility] OFFICE PROGRESS NOTE  Charlene Hatch, MD [address] [place] [address]  DIAGNOSIS: Iron deficiency anemia secondary to history of gastric ulcer and hemorrhoids   PRIOR THERAPY: Iron infusion with Venofer 300 Mg IV weekly for 3 weeks most recent dose on 09/11/23  CURRENT THERAPY: Over-the-counter ferrous sulfate with vitamin C vs multivitamin   INTERVAL HISTORY: Charlene [name] 43 y.o. female returns to clinic today for follow-up visit.  The patient is seen for iron deficiency anemia felt to be secondary to hemorrhoids and ulcers.  She is currently taking ***supplement versus intolerance?  Receives IV iron as needed, the most recent being on 09/11/23  He reports she has regular menstrual cycles which last 4-5 days without associated clots.  Ports her cycles are not heavy.     ***multiple sclerosis OSA  Overall regarding symptoms, she reports ***fatigue, headache, dizzy, weak, and has restless legs.  He also describes palpitations.  She sees a lot of medical doctors. She denies gingival bleeding, epistaxis, hemoptysis, hematemesis, hematochezia, or melena.  He has not had any trouble with her hemorrhoids since she had her hemorrhoidal banding.  Denies blood thinner use. She had an EGD on 05/22/22 which showed gastritis and erosive esophagitis.  Patient states she was told she had a stomach ulcer in the past.  She had a colonoscopy on 10/17/20 showing non-bleeding external and internal hemorrhoids that were medium-sized       MEDICAL HISTORY: Past Medical History:  Diagnosis Date   Alcoholism (HCC)    Anxiety    Anxiety and depression    Bipolar 2 disorder (HCC)    Borderline personality disorder (HCC)    Chest pain    Chronic fatigue syndrome    Claustrophobia    on meds   Constipation    Depression    on meds   Elevated blood sugar 06/08/2014   Elevated LFTs    Fatigue    Fatty liver 10/2014   GERD (gastroesophageal reflux disease)     diet related   Heart murmur    Hyperlipidemia    on meds   Infertility associated with anovulation    Joint pain    Low grade squamous intraepithelial lesion (LGSIL) on Pap smear 10/30/2011   Major depressive disorder    Migraines    MS (multiple sclerosis) (HCC)    Neuromuscular disorder (HCC)    Obesity    Obesity in pregnancy, antepartum    PCOS (polycystic ovarian syndrome)    Personal history of pre-term labor    Prediabetes    Pregnancy induced hypertension    pregnancy related- hx of   Psoriasis    Sleep apnea    SOB (shortness of breath)    Vitamin D deficiency     ALLERGIES:  is allergic to ciprofloxacin, african mango [irvingia gabonensis], atorvastatin, and rosuvastatin.  MEDICATIONS:  Current Outpatient Medications  Medication Sig Dispense Refill   clobetasol cream (TEMOVATE) 0.05 % Apply 1 Application topically 2 (two) times daily.     COSENTYX UNOREADY 300 MG/2ML SOAJ 300 mg.     Evolocumab (REPATHA SURECLICK) 140 MG/ML SOAJ Inject 140 mg into the skin every 14 (fourteen) days. 6 mL 3   gabapentin (NEURONTIN) 100 MG capsule Take 100 mg by mouth 2 (two) times daily as needed.     lamoTRIgine (LAMICTAL) 200 MG tablet Take 200 mg by mouth daily.     metFORMIN (GLUCOPHAGE) 500 MG tablet Take 1 tablet (500 mg  total) by mouth daily. 90 tablet 0   miconazole (MICOTIN) 2 % cream Apply 1 Application topically 2 (two) times daily. 56.7 g 1   Multiple Vitamin (MULTIVITAMIN) tablet Take 1 tablet by mouth daily.     omeprazole (PRILOSEC) 40 MG capsule Take 1 capsule (40 mg total) by mouth daily. 90 capsule 3   ondansetron (ZOFRAN) 4 MG tablet Take 1 tablet (4 mg total) by mouth every 6 (six) hours as needed for nausea or vomiting. 20 tablet 0   Sulfacetamide Sodium-Sulfur 10-2 % LIQD Apply topically as needed.     tirzepatide (ZEPBOUND) 12.5 MG/0.5ML Pen Inject 12.5 mg into the skin once a week. 2 mL 0   Vitamin D, Ergocalciferol, (DRISDOL) 1.25 MG (50000 UNIT) CAPS capsule  Take 1 capsule (50,000 Units total) by mouth every 7 (seven) days. 12 capsule 3   VTAMA 1 % CREA Apply 1 Application topically daily.     VYVANSE 60 MG capsule Take 60 mg by mouth every morning.     zolmitriptan (ZOMIG) 5 MG tablet TAKE ONE TABLET BY MOUTH AS NEEDED FOR MIGRAINES. MAY REPEAT IN TWO HOURS IF NEEDED. MAX 2 TABLETS A DAY OR 4 TABLETS A WEEK 10 tablet 6   No current facility-administered medications for this visit.    SURGICAL HISTORY:  Past Surgical History:  Procedure Laterality Date   COLONOSCOPY  10/17/2020   DILATION AND CURETTAGE OF UTERUS  08/15/2008   DILATION AND EVACUATION  12/29/2011   Procedure: DILATATION AND EVACUATION;  Surgeon: Catalina Antigua, MD;  Location: [facility];  Service: Gynecology;;  Dr, Emelda Fear transferred case to Dr. Jolayne Panther   LAPAROSCOPIC GASTRIC SLEEVE RESECTION N/A 12/07/2014   Procedure: LAPAROSCOPIC GASTRIC SLEEVE RESECTION;  Surgeon: Ovidio Kin, MD;  Location: [facility];  Service: General;  Laterality: N/A;    REVIEW OF SYSTEMS:   Review of Systems  Constitutional: Negative for appetite change, chills, fatigue, fever and unexpected weight change.  HENT:   Negative for mouth sores, nosebleeds, sore throat and trouble swallowing.   Eyes: Negative for eye problems and icterus.  Respiratory: Negative for cough, hemoptysis, shortness of breath and wheezing.   Cardiovascular: Negative for chest pain and leg swelling.  Gastrointestinal: Negative for abdominal pain, constipation, diarrhea, nausea and vomiting.  Genitourinary: Negative for bladder incontinence, difficulty urinating, dysuria, frequency and hematuria.   Musculoskeletal: Negative for back pain, gait problem, neck pain and neck stiffness.  Skin: Negative for itching and rash.  Neurological: Negative for dizziness, extremity weakness, gait problem, headaches, light-headedness and seizures.  Hematological: Negative for adenopathy. Does not bruise/bleed easily.  Psychiatric/Behavioral:  Negative for confusion, depression and sleep disturbance. The patient is not nervous/anxious.     PHYSICAL EXAMINATION:  There were no vitals taken for this visit.  ECOG PERFORMANCE STATUS: {CHL ONC ECOG [id number]  Physical Exam  Constitutional: Oriented to person, place, and time and well-developed, well-nourished, and in no distress. No distress.  HENT:  Head: Normocephalic and atraumatic.  Mouth/Throat: Oropharynx is clear and moist. No oropharyngeal exudate.  Eyes: Conjunctivae are normal. Right eye exhibits no discharge. Left eye exhibits no discharge. No scleral icterus.  Neck: Normal range of motion. Neck supple.  Cardiovascular: Normal rate, regular rhythm, normal heart sounds and intact distal pulses.   Pulmonary/Chest: Effort normal and breath sounds normal. No respiratory distress. No wheezes. No rales.  Abdominal: Soft. Bowel sounds are normal. Exhibits no distension and no mass. There is no tenderness.  Musculoskeletal: Normal range of motion. Exhibits no edema.  Lymphadenopathy:    No cervical adenopathy.  Neurological: Alert and oriented to person, place, and time. Exhibits normal muscle tone. Gait normal. Coordination normal.  Skin: Skin is warm and dry. No rash noted. Not diaphoretic. No erythema. No pallor.  Psychiatric: Mood, memory and judgment normal.  Vitals reviewed.  LABORATORY DATA: Lab Results  Component Value Date   WBC 8.5 02/04/2023   HGB 14.7 02/04/2023   HCT 40.7 02/04/2023   MCV 89.3 02/04/2023   PLT 289 02/04/2023      Chemistry      Component Value Date/Time   NA 139 10/09/2022 0956   K 4.0 10/09/2022 0956   CL 101 10/09/2022 0956   CO2 24 10/09/2022 0956   BUN 11 10/09/2022 0956   CREATININE 0.59 10/09/2022 0956   CREATININE 0.71 08/14/2022 1304   CREATININE 0.43 (L) 12/31/2013 1542      Component Value Date/Time   CALCIUM 9.4 10/09/2022 0956   ALKPHOS 69 10/09/2022 0956   AST 20 10/09/2022 0956   AST 17 08/14/2022 1304    ALT 17 10/09/2022 0956   ALT 15 08/14/2022 1304   BILITOT <0.2 10/09/2022 0956   BILITOT 0.3 08/14/2022 1304       RADIOGRAPHIC STUDIES:  No results found.   ASSESSMENT/PLAN:  This is a very pleasant 43 year old Caucasian female with iron deficiency anemia secondary to GI blood loss.  The patient receives IV iron as needed with Venofer the most recent dose being in August 2024.  She is currently taking a***supplement.  The patient's labs today show ***  Recommend that she ***with repeat blood work in 3 months.  Of course were always happy to see her sooner if she has any worsening signs and symptoms of anemia.  She will continue to take***at this time.  The patient was advised to call immediately if she has any concerning symptoms in the interval. The patient voices understanding of current disease status and treatment options and is in agreement with the current care plan. All questions were answered. The patient knows to call the clinic with any problems, questions or concerns. We can certainly see the patient much sooner if necessary   No orders of the defined types were placed in this encounter.    I spent {CHL ONC TIME VISIT - WUJWJ:[id number]} counseling the patient face to face. The total time spent in the appointment was {CHL ONC TIME VISIT - OZHYQ:[id number]}.  Rondy Krupinski L Paola Flynt, PA-C 05/01/23

## 2023-05-05 ENCOUNTER — Encounter (INDEPENDENT_AMBULATORY_CARE_PROVIDER_SITE_OTHER): Payer: Self-pay | Admitting: *Deleted

## 2023-05-06 ENCOUNTER — Ambulatory Visit (INDEPENDENT_AMBULATORY_CARE_PROVIDER_SITE_OTHER): Payer: 59 | Admitting: Family Medicine

## 2023-05-06 ENCOUNTER — Telehealth: Payer: Self-pay | Admitting: Physician Assistant

## 2023-05-06 ENCOUNTER — Other Ambulatory Visit: Payer: 59

## 2023-05-06 ENCOUNTER — Telehealth (INDEPENDENT_AMBULATORY_CARE_PROVIDER_SITE_OTHER): Payer: Self-pay | Admitting: Family Medicine

## 2023-05-06 ENCOUNTER — Ambulatory Visit: Payer: 59 | Admitting: Physician Assistant

## 2023-05-06 ENCOUNTER — Encounter (INDEPENDENT_AMBULATORY_CARE_PROVIDER_SITE_OTHER): Payer: Self-pay

## 2023-05-06 DIAGNOSIS — E88819 Insulin resistance, unspecified: Secondary | ICD-10-CM

## 2023-05-06 DIAGNOSIS — E559 Vitamin D deficiency, unspecified: Secondary | ICD-10-CM

## 2023-05-06 DIAGNOSIS — R632 Polyphagia: Secondary | ICD-10-CM

## 2023-05-06 NOTE — Telephone Encounter (Signed)
 Rescheduled appointments per the patient request. The patient has strep throat and is not able to make todays appointment. The patient is aware of the changes made.

## 2023-05-06 NOTE — Telephone Encounter (Signed)
 Good morning,  We rescheduled her due to Dr. Emogene Harpin but she is due for multiple refills for zepbound  and metformin . Her injection day is Thursday and she is concerned with having a gap.   I told her I was unsure of how this would get handled with Dr. B out.  Thanks!

## 2023-05-07 ENCOUNTER — Other Ambulatory Visit: Payer: Self-pay | Admitting: Neurology

## 2023-05-07 ENCOUNTER — Encounter: Payer: Self-pay | Admitting: Neurology

## 2023-05-07 ENCOUNTER — Other Ambulatory Visit (INDEPENDENT_AMBULATORY_CARE_PROVIDER_SITE_OTHER): Payer: Self-pay | Admitting: Family Medicine

## 2023-05-07 DIAGNOSIS — E88819 Insulin resistance, unspecified: Secondary | ICD-10-CM

## 2023-05-07 DIAGNOSIS — G35 Multiple sclerosis: Secondary | ICD-10-CM

## 2023-05-07 MED ORDER — METFORMIN HCL 500 MG PO TABS: 500.0000 mg | ORAL_TABLET | Freq: Every day | ORAL | 0 refills | Status: DC

## 2023-05-07 MED ORDER — TIRZEPATIDE-WEIGHT MANAGEMENT 12.5 MG/0.5ML ~~LOC~~ SOAJ: 12.5000 mg | SUBCUTANEOUS | 0 refills | Status: DC

## 2023-05-07 NOTE — Telephone Encounter (Signed)
 Pt was last seen on 10/11/22 Follow up scheduled on 06/24/23  Next Ocrevus  infusion date: 07/23/2023

## 2023-05-08 ENCOUNTER — Telehealth: Payer: Self-pay | Admitting: Neurology

## 2023-05-08 ENCOUNTER — Encounter: Payer: Self-pay | Admitting: Neurology

## 2023-05-08 NOTE — Telephone Encounter (Signed)
 I called the patient to schedule her MRI at [facility] and she asked that she go to GI instead. She is going to call them to schedule. [facility] NPR case #[id number]

## 2023-05-13 ENCOUNTER — Ambulatory Visit (INDEPENDENT_AMBULATORY_CARE_PROVIDER_SITE_OTHER): Admitting: Family Medicine

## 2023-05-13 ENCOUNTER — Encounter (INDEPENDENT_AMBULATORY_CARE_PROVIDER_SITE_OTHER): Payer: Self-pay | Admitting: Family Medicine

## 2023-05-13 VITALS — BP 114/80 | HR 90 | Temp 98.4°F | Ht 65.5 in | Wt 209.0 lb

## 2023-05-13 DIAGNOSIS — Z6834 Body mass index (BMI) 34.0-34.9, adult: Secondary | ICD-10-CM | POA: Diagnosis not present

## 2023-05-13 DIAGNOSIS — R7303 Prediabetes: Secondary | ICD-10-CM | POA: Insufficient documentation

## 2023-05-13 DIAGNOSIS — E669 Obesity, unspecified: Secondary | ICD-10-CM

## 2023-05-13 DIAGNOSIS — F419 Anxiety disorder, unspecified: Secondary | ICD-10-CM | POA: Diagnosis not present

## 2023-05-13 NOTE — Progress Notes (Signed)
 Office: [phone]  /  Fax: [phone]  WEIGHT SUMMARY AND BIOMETRICS  Anthropometric Measurements Height: 5' 5.5" (1.664 m) Weight: 209 lb (94.8 kg) BMI (Calculated): 34.24 Weight at Last Visit: 223lb Weight Lost Since Last Visit: 14lb Weight Gained Since Last Visit: 0 Starting Weight: 250lb Total Weight Loss (lbs): 39 lb (17.7 kg) Peak Weight: 260lb   Body Composition  Body Fat %: 39.7 % Fat Mass (lbs): 83 lbs Muscle Mass (lbs): 119.6 lbs Total Body Water  (lbs): 83 lbs Visceral Fat Rating : 9   Other Clinical Data Fasting: no Labs: no Today's Visit #: 26 Starting Date: 03/29/21    Chief Complaint: OBESITY   History of Present Illness Charlene [name] is a 43 year old female with obesity, anxiety, and prediabetes who presents for a follow-up on her weight management plan.  She is adhering to the category two eating plan approximately 75% of the time and engages in exercise for 30 minutes once or twice a week. She has experienced a weight loss of approximately 14 pounds in the last month, although she questions the accuracy of this as her home scale indicates a loss of 8 to 9 pounds. A stressful period due to her daughter's bullying situation has impacted her ability to focus on her weight management plan.  She experiences high blood pressure and heart rate, which she attributes to stress related to her daughter's bullying. Her exercise routine has decreased, although she was previously attending the gym and performing cardio exercises. Her blood pressure was elevated during the visit but improved after deep breathing exercises.  She is currently taking gabapentin 100 mg twice a day as needed for anxiety, though she is uncertain of its effectiveness. She also takes metformin  and Zepbound  for her prediabetes and weight management.  Her 81 year old daughter is experiencing significant bullying at school, ongoing for about two months, causing considerable stress for her  and contributing to her anxiety. Her daughter is on fluoxetine  for anxiety and ADHD medication, and she sees the school psychologist regularly.      PHYSICAL EXAM:  Blood pressure 114/80, pulse 90, temperature 98.4 F (36.9 C), height 5' 5.5" (1.664 m), weight 209 lb (94.8 kg), last menstrual period 05/03/2023, SpO2 97%. Body mass index is 34.25 kg/m.  DIAGNOSTIC DATA REVIEWED:  BMET    Component Value Date/Time   NA 139 10/09/2022 0956   K 4.0 10/09/2022 0956   CL 101 10/09/2022 0956   CO2 24 10/09/2022 0956   GLUCOSE 79 10/09/2022 0956   GLUCOSE 92 08/14/2022 1304   GLUCOSE 93 11/24/2013 1126   BUN 11 10/09/2022 0956   CREATININE 0.59 10/09/2022 0956   CREATININE 0.71 08/14/2022 1304   CREATININE 0.43 (L) 12/31/2013 1542   CALCIUM  9.4 10/09/2022 0956   GFRNONAA >60 08/14/2022 1304   GFRAA >60 12/02/2014 1120   Lab Results  Component Value Date   HGBA1C 5.5 10/09/2022   HGBA1C 4.9 05/05/2018   Lab Results  Component Value Date   INSULIN  9.2 10/09/2022   INSULIN  15.6 03/29/2021   Lab Results  Component Value Date   TSH 0.744 10/09/2022   CBC    Component Value Date/Time   WBC 8.5 02/04/2023 1320   WBC 6.1 04/25/2022 1206   RBC 4.56 02/04/2023 1320   HGB 14.7 02/04/2023 1320   HGB 13.9 10/09/2022 0956   HCT 40.7 02/04/2023 1320   HCT 44.2 10/09/2022 0956   PLT 289 02/04/2023 1320   PLT 324 10/09/2022 0956   MCV  89.3 02/04/2023 1320   MCV 91 10/09/2022 0956   MCH 32.2 02/04/2023 1320   MCHC 36.1 (H) 02/04/2023 1320   RDW 11.2 (L) 02/04/2023 1320   RDW 16.9 (H) 10/09/2022 0956   Iron  Studies    Component Value Date/Time   IRON  93 02/04/2023 1320   IRON  108 10/09/2022 0956   TIBC 354 02/04/2023 1320   TIBC 331 10/09/2022 0956   FERRITIN 27 02/04/2023 1319   FERRITIN 112 10/09/2022 0956   IRONPCTSAT 26 02/04/2023 1320   IRONPCTSAT 33 10/09/2022 0956   IRONPCTSAT 12 (L) 04/25/2022 1206   Lipid Panel     Component Value Date/Time   CHOL 275 (H)  10/09/2022 0956   TRIG 345 (H) 10/09/2022 0956   HDL 43 10/09/2022 0956   CHOLHDL 3 06/17/2020 1139   VLDL 27.6 06/17/2020 1139   LDLCALC 166 (H) 10/09/2022 0956   LDLDIRECT 236.0 12/22/2019 1131   Hepatic Function Panel     Component Value Date/Time   PROT 6.8 10/09/2022 0956   ALBUMIN 4.6 10/09/2022 0956   AST 20 10/09/2022 0956   AST 17 08/14/2022 1304   ALT 17 10/09/2022 0956   ALT 15 08/14/2022 1304   ALKPHOS 69 10/09/2022 0956   BILITOT <0.2 10/09/2022 0956   BILITOT 0.3 08/14/2022 1304   BILIDIR 0.0 04/25/2022 1206      Component Value Date/Time   TSH 0.744 10/09/2022 0956   Nutritional Lab Results  Component Value Date   VD25OH 52.1 09/05/2022   VD25OH 68.7 03/28/2022   VD25OH 68.0 11/08/2021     Assessment and Plan Assessment & Plan Anxiety Experiencing elevated stress and anxiety primarily due to her daughter's bullying situation. Currently on gabapentin 100 mg twice daily as needed, with uncertainty about its effectiveness. Also on metformin  and Zepbound  for prediabetes, which may indirectly affect anxiety levels. - Increase gabapentin to 200 mg as needed, with potential increase to 300 mg if no improvement after a few weeks. - Encourage stress management techniques, including exercise and hydration.  Obesity and Prediabetes Following category two eating plan approximately 75% of the time and exercises 30 minutes once or twice weekly. Lost approximately 14 pounds in the last month, though home scale indicates 8-9 pounds loss. Weight loss includes reduction in water  weight, fat, and some muscle mass. High stress levels due to daughter's bullying may impact eating and exercise habits. - Continue category two eating plan. - Encourage exercise, aiming for 30 minutes most days, focusing on cardio and strength training. - Monitor weight and muscle mass to prevent further muscle loss. - Ensure adequate protein intake to support muscle maintenance. - Hydrate  adequately, considering caffeine -free diet sodas as a hydration source. - Continue metformin  and Zepbound      She was informed of the importance of frequent follow up visits to maximize her success with intensive lifestyle modifications for her multiple health conditions.    Jasmine Mesi, MD

## 2023-05-16 NOTE — Progress Notes (Unsigned)
 [facility] OFFICE PROGRESS NOTE  Jess Morita, MD [phone] [address] [place] [address]  DIAGNOSIS: Iron  deficiency anemia secondary to history of gastric ulcer and hemorrhoids   PRIOR THERAPY: Iron  infusion with Venofer  300 Mg IV weekly for 3 weeks most recent dose on 09/11/23  CURRENT THERAPY: Over-the-counter ferrous sulfate  with vitamin C vs multivitamin   INTERVAL HISTORY: Charlene [name] 43 y.o. female returns to clinic today for follow-up visit.  The patient is seen for iron  deficiency anemia. She had her hemorrhoids banded though and has not had any rectal bleeding since that time.   She experiences fatigue, which she describes as similar to when her iron  and ferritin levels were low. Her ferritin levels were low normal three months ago and have been trending downwards. She previously felt better after an iron  infusion. She is currently taking an over-the-counter iron  supplement with vitamin C every couple of days, which she tolerates well.  She has a history of multiple sclerosis and sleep apnea. Her menstrual cycles are normal and not heavy. No abnormal bleeding, including epistaxis, gum bleeding, hemoptysis, or melena. She previously had hemorrhoids, which were banded, resolving the issue without further bleeding.  She has been on omeprazole  for over a year following an endoscopy 05/22/22 that revealed showed gastritis and erosive esophagitis.  She reports a persistent cough, particularly in the morning, which she does not attribute to allergies.  She is actively participating in a healthy weight and weight loss program, having lost a significant amount of weight over the past three months. She is here for evaluation and repeat blood work.       MEDICAL HISTORY: Past Medical History:  Diagnosis Date   Alcoholism (HCC)    Anxiety    Anxiety and depression    Bipolar 2 disorder (HCC)    Borderline personality disorder (HCC)    Chest pain    Chronic  fatigue syndrome    Claustrophobia    on meds   Constipation    Depression    on meds   Elevated blood sugar 06/08/2014   Elevated LFTs    Fatigue    Fatty liver 10/2014   GERD (gastroesophageal reflux disease)    diet related   Heart murmur    Hyperlipidemia    on meds   Infertility associated with anovulation    Joint pain    Low grade squamous intraepithelial lesion (LGSIL) on Pap smear 10/30/2011   Major depressive disorder    Migraines    MS (multiple sclerosis) (HCC)    Neuromuscular disorder (HCC)    Obesity    Obesity in pregnancy, antepartum    PCOS (polycystic ovarian syndrome)    Personal history of pre-term labor    Prediabetes    Pregnancy induced hypertension    pregnancy related- hx of   Psoriasis    Sleep apnea    SOB (shortness of breath)    Vitamin D  deficiency     ALLERGIES:  is allergic to ciprofloxacin, african mango [irvingia gabonensis], atorvastatin , and rosuvastatin .  MEDICATIONS:  Current Outpatient Medications  Medication Sig Dispense Refill   clobetasol cream (TEMOVATE) 0.05 % Apply 1 Application topically 2 (two) times daily.     COSENTYX UNOREADY 300 MG/2ML SOAJ 300 mg.     Evolocumab  (REPATHA  SURECLICK) 140 MG/ML SOAJ Inject 140 mg into the skin every 14 (fourteen) days. 6 mL 3   gabapentin (NEURONTIN) 100 MG capsule Take 100 mg by mouth 2 (two)  times daily as needed.     lamoTRIgine (LAMICTAL) 200 MG tablet Take 200 mg by mouth daily.     metFORMIN  (GLUCOPHAGE ) 500 MG tablet Take 1 tablet (500 mg total) by mouth daily. 30 tablet 0   miconazole  (MICOTIN) 2 % cream Apply 1 Application topically 2 (two) times daily. 56.7 g 1   Multiple Vitamin (MULTIVITAMIN) tablet Take 1 tablet by mouth daily.     omeprazole  (PRILOSEC) 40 MG capsule Take 1 capsule (40 mg total) by mouth daily. 90 capsule 3   ondansetron  (ZOFRAN ) 4 MG tablet Take 1 tablet (4 mg total) by mouth every 6 (six) hours as needed for nausea or vomiting. 20 tablet 0    Sulfacetamide Sodium-Sulfur 10-2 % LIQD Apply topically as needed.     tirzepatide  (ZEPBOUND ) 12.5 MG/0.5ML Pen Inject 12.5 mg into the skin once a week. 2 mL 0   Vitamin D , Ergocalciferol , (DRISDOL ) 1.25 MG (50000 UNIT) CAPS capsule Take 1 capsule (50,000 Units total) by mouth every 7 (seven) days. 12 capsule 3   VTAMA 1 % CREA Apply 1 Application topically daily.     VYVANSE 60 MG capsule Take 60 mg by mouth every morning.     zolmitriptan  (ZOMIG ) 5 MG tablet TAKE ONE TABLET BY MOUTH AS NEEDED FOR MIGRAINES. MAY REPEAT IN TWO HOURS IF NEEDED. MAX 2 TABLETS A DAY OR 4 TABLETS A WEEK 10 tablet 6   No current facility-administered medications for this visit.    SURGICAL HISTORY:  Past Surgical History:  Procedure Laterality Date   COLONOSCOPY  10/17/2020   DILATION AND CURETTAGE OF UTERUS  08/15/2008   DILATION AND EVACUATION  12/29/2011   Procedure: DILATATION AND EVACUATION;  Surgeon: Verlyn Goad, MD;  Location: [facility];  Service: Gynecology;;  Dr, Monty App transferred case to Dr. Dodie Frees   LAPAROSCOPIC GASTRIC SLEEVE RESECTION N/A 12/07/2014   Procedure: LAPAROSCOPIC GASTRIC SLEEVE RESECTION;  Surgeon: Juanita Norlander, MD;  Location: [facility];  Service: General;  Laterality: N/A;    REVIEW OF SYSTEMS:   Review of Systems  Constitutional: Positive for fatigue. Negative for appetite change, chills, fever and unexpected weight change.  HENT:   Negative for mouth sores, nosebleeds, sore throat and trouble swallowing.   Eyes: Negative for eye problems and icterus.  Respiratory: Positive for cough. Negative for hemoptysis, shortness of breath and wheezing.   Cardiovascular: Negative for chest pain and leg swelling.  Gastrointestinal: Negative for abdominal pain, constipation, diarrhea, nausea and vomiting.  Genitourinary: Negative for bladder incontinence, difficulty urinating, dysuria, frequency and hematuria.   Musculoskeletal: Negative for back pain, gait problem, neck pain and neck  stiffness.  Skin: Negative for itching and rash.  Neurological: Negative for dizziness, extremity weakness, gait problem, headaches, light-headedness and seizures.  Hematological: Negative for adenopathy. Does not bruise/bleed easily.  Psychiatric/Behavioral: Negative for confusion, depression and sleep disturbance. The patient is not nervous/anxious.     PHYSICAL EXAMINATION:  Last menstrual period 05/03/2023.  ECOG PERFORMANCE STATUS: 0  Physical Exam  Constitutional: Oriented to person, place, and time and well-developed, well-nourished, and in no distress. HENT:  Head: Normocephalic and atraumatic.  Mouth/Throat: Oropharynx is clear and moist. No oropharyngeal exudate.  Eyes: Conjunctivae are normal. Right eye exhibits no discharge. Left eye exhibits no discharge. No scleral icterus.  Neck: Normal range of motion. Neck supple.  Cardiovascular: Normal rate, regular rhythm, normal heart sounds and intact distal pulses.   Pulmonary/Chest: Effort normal and breath sounds normal. No respiratory distress. No wheezes. No rales.  Abdominal: Soft. Bowel sounds are normal. Exhibits no distension and no mass. There is no tenderness.  Musculoskeletal: Normal range of motion. Exhibits no edema.  Lymphadenopathy:    No cervical adenopathy.  Neurological: Alert and oriented to person, place, and time. Exhibits normal muscle tone. Gait normal. Coordination normal.  Skin: Skin is warm and dry. No rash noted. Not diaphoretic. No erythema. No pallor.  Psychiatric: Mood, memory and judgment normal.  Vitals reviewed.  LABORATORY DATA: Lab Results  Component Value Date   WBC 8.5 02/04/2023   HGB 14.7 02/04/2023   HCT 40.7 02/04/2023   MCV 89.3 02/04/2023   PLT 289 02/04/2023      Chemistry      Component Value Date/Time   NA 139 10/09/2022 0956   K 4.0 10/09/2022 0956   CL 101 10/09/2022 0956   CO2 24 10/09/2022 0956   BUN 11 10/09/2022 0956   CREATININE 0.59 10/09/2022 0956    CREATININE 0.71 08/14/2022 1304   CREATININE 0.43 (L) 12/31/2013 1542      Component Value Date/Time   CALCIUM  9.4 10/09/2022 0956   ALKPHOS 69 10/09/2022 0956   AST 20 10/09/2022 0956   AST 17 08/14/2022 1304   ALT 17 10/09/2022 0956   ALT 15 08/14/2022 1304   BILITOT <0.2 10/09/2022 0956   BILITOT 0.3 08/14/2022 1304       RADIOGRAPHIC STUDIES:  No results found.   ASSESSMENT/PLAN:  This is a very pleasant 43 year old Caucasian female with iron  deficiency anemia secondary to GI blood loss.  The patient receives IV iron  as needed with Venofer  the most recent dose being in August 2024.  She is currently taking a iron  supplement a few times a week with vitamin C  The patient's labs today show no anemia. Her iron  is normal. Her ferritin is pending.   She feels similar to when she needed iron  infusion before and felt better after. She is interested to know her ferritin due to it being on the low end of normal at her last appointment.   I will send her a mychart with the results.   Recommend that she continue on her iron  supplement with repeat blood work in 3 months.  Of course were always happy to see her sooner if she has any worsening signs and symptoms of anemia.  The patient was advised to call immediately if she has any concerning symptoms in the interval. The patient voices understanding of current disease status and treatment options and is in agreement with the current care plan. All questions were answered. The patient knows to call the clinic with any problems, questions or concerns. We can certainly see the patient much sooner if necessary   No orders of the defined types were placed in this encounter.   The total time spent in the appointment was 20-29 minutes  Jeanmarie Mccowen L Leani Myron, PA-C 05/16/23

## 2023-05-22 ENCOUNTER — Inpatient Hospital Stay (HOSPITAL_BASED_OUTPATIENT_CLINIC_OR_DEPARTMENT_OTHER): Admitting: Physician Assistant

## 2023-05-22 ENCOUNTER — Inpatient Hospital Stay: Attending: Internal Medicine

## 2023-05-22 VITALS — BP 139/74 | HR 93 | Temp 97.7°F | Resp 16 | Wt 212.4 lb

## 2023-05-22 DIAGNOSIS — D509 Iron deficiency anemia, unspecified: Secondary | ICD-10-CM | POA: Diagnosis not present

## 2023-05-22 DIAGNOSIS — K649 Unspecified hemorrhoids: Secondary | ICD-10-CM | POA: Insufficient documentation

## 2023-05-22 DIAGNOSIS — D508 Other iron deficiency anemias: Secondary | ICD-10-CM | POA: Insufficient documentation

## 2023-05-22 DIAGNOSIS — Z8711 Personal history of peptic ulcer disease: Secondary | ICD-10-CM | POA: Insufficient documentation

## 2023-05-22 DIAGNOSIS — Z8719 Personal history of other diseases of the digestive system: Secondary | ICD-10-CM | POA: Insufficient documentation

## 2023-05-22 DIAGNOSIS — D539 Nutritional anemia, unspecified: Secondary | ICD-10-CM

## 2023-05-22 LAB — IRON AND IRON BINDING CAPACITY (CC-WL,HP ONLY)
Iron: 72 ug/dL (ref 28–170)
Saturation Ratios: 20 % (ref 10.4–31.8)
TIBC: 361 ug/dL (ref 250–450)
UIBC: 289 ug/dL (ref 148–442)

## 2023-05-22 LAB — CBC WITH DIFFERENTIAL (CANCER CENTER ONLY)
Abs Immature Granulocytes: 0.02 10*3/uL (ref 0.00–0.07)
Basophils Absolute: 0.1 10*3/uL (ref 0.0–0.1)
Basophils Relative: 1 %
Eosinophils Absolute: 0.1 10*3/uL (ref 0.0–0.5)
Eosinophils Relative: 1 %
HCT: 41.6 % (ref 36.0–46.0)
Hemoglobin: 15 g/dL (ref 12.0–15.0)
Immature Granulocytes: 0 %
Lymphocytes Relative: 15 %
Lymphs Abs: 1 10*3/uL (ref 0.7–4.0)
MCH: 32.3 pg (ref 26.0–34.0)
MCHC: 36.1 g/dL — ABNORMAL HIGH (ref 30.0–36.0)
MCV: 89.5 fL (ref 80.0–100.0)
Monocytes Absolute: 0.6 10*3/uL (ref 0.1–1.0)
Monocytes Relative: 10 %
Neutro Abs: 4.8 10*3/uL (ref 1.7–7.7)
Neutrophils Relative %: 73 %
Platelet Count: 313 10*3/uL (ref 150–400)
RBC: 4.65 MIL/uL (ref 3.87–5.11)
RDW: 11.9 % (ref 11.5–15.5)
WBC Count: 6.5 10*3/uL (ref 4.0–10.5)
nRBC: 0 % (ref 0.0–0.2)

## 2023-05-22 LAB — FERRITIN: Ferritin: 31 ng/mL (ref 11–307)

## 2023-05-23 ENCOUNTER — Encounter: Payer: Self-pay | Admitting: Physician Assistant

## 2023-05-23 ENCOUNTER — Other Ambulatory Visit: Payer: Self-pay | Admitting: Gastroenterology

## 2023-05-24 ENCOUNTER — Other Ambulatory Visit: Payer: Self-pay | Admitting: Physician Assistant

## 2023-05-24 DIAGNOSIS — K5909 Other constipation: Secondary | ICD-10-CM

## 2023-05-24 DIAGNOSIS — D509 Iron deficiency anemia, unspecified: Secondary | ICD-10-CM

## 2023-05-24 MED ORDER — INTEGRA PLUS PO CAPS: 1.0000 | ORAL_CAPSULE | Freq: Every morning | ORAL | 2 refills | Status: DC

## 2023-05-27 ENCOUNTER — Telehealth: Payer: Self-pay | Admitting: Neurology

## 2023-05-27 NOTE — Telephone Encounter (Signed)
 r/s appointment due to a conflict

## 2023-05-31 NOTE — Progress Notes (Signed)
   I, Leone Ralphs am a scribe for Dr. Garlan Juniper, MD.  Charlene [name] is a 43 y.o. female who presents to [facility] today for right elbow pain, referred by Dr. Ebbie Goldmann. Pt was last seen by Dr. Alease Hunter on 06/06/22 for chronic bilateral thoracic back pain and RLS.   Today, patient c/o more than six months in pain. 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 Medication tried IBU 800  twice a day, couple rounds of steroids, an injection, pain patches, tens unit, icey hot, vibrating brace. Non of these things have been working.   Pertinent review of systems: No fevers or chills  Relevant historical information: Multiple sclerosis.  Psoriasis.  Probable psoriatic arthritis.  History of gastric sleeve.   Exam:  BP 130/80   Pulse 98   Ht 5' 5.5" (1.664 m)   Wt 211 lb (95.7 kg)   LMP 05/03/2023   SpO2 98%   BMI 34.58 kg/m  General: Well Developed, well nourished, and in no acute distress.   MSK: Right elbow normal-appearing Normal motion. Tender palpation lateral epicondyle. Pain with resisted finger and wrist extension. Grip strength intact but does reproduce pain. Elbow strength is intact.    Lab and Radiology Results  Diagnostic Limited MSK Ultrasound of: Right elbow lateral epicondyle Intact common extensor tendon origin with areas of calcification and increased Doppler flow consistent with lateral epicondylitis Impression: Lateral epicondylitis without tear      Assessment and Plan: 43 y.o. female with right lateral elbow pain due to lateral epicondylitis.  Plan on home exercise program and Voltaren gel.  If not improving consider formal referral to occupational therapy.   PDMP not reviewed this encounter. Orders Placed This Encounter  Procedures   US  LIMITED JOINT SPACE STRUCTURES UP RIGHT(NO LINKED CHARGES)    Reason for Exam (SYMPTOM  OR DIAGNOSIS REQUIRED):    right elbow pain    Preferred imaging location?:   [facility]   No orders of the defined types were placed in this encounter.    Discussed warning signs or symptoms. Please see discharge instructions. Patient expresses understanding.   The above documentation has been reviewed and is accurate and complete Garlan Juniper, M.D.

## 2023-06-03 ENCOUNTER — Other Ambulatory Visit: Payer: Self-pay

## 2023-06-03 ENCOUNTER — Encounter (INDEPENDENT_AMBULATORY_CARE_PROVIDER_SITE_OTHER): Payer: Self-pay | Admitting: Family Medicine

## 2023-06-03 ENCOUNTER — Ambulatory Visit (INDEPENDENT_AMBULATORY_CARE_PROVIDER_SITE_OTHER): Admitting: Family Medicine

## 2023-06-03 VITALS — BP 130/80 | HR 98 | Ht 65.5 in | Wt 211.0 lb

## 2023-06-03 VITALS — BP 111/74 | HR 92 | Temp 98.2°F | Ht 65.5 in | Wt 207.0 lb

## 2023-06-03 DIAGNOSIS — R5383 Other fatigue: Secondary | ICD-10-CM | POA: Diagnosis not present

## 2023-06-03 DIAGNOSIS — R632 Polyphagia: Secondary | ICD-10-CM | POA: Diagnosis not present

## 2023-06-03 DIAGNOSIS — R7303 Prediabetes: Secondary | ICD-10-CM

## 2023-06-03 DIAGNOSIS — D509 Iron deficiency anemia, unspecified: Secondary | ICD-10-CM

## 2023-06-03 DIAGNOSIS — E559 Vitamin D deficiency, unspecified: Secondary | ICD-10-CM | POA: Diagnosis not present

## 2023-06-03 DIAGNOSIS — M25521 Pain in right elbow: Secondary | ICD-10-CM

## 2023-06-03 DIAGNOSIS — Z6841 Body Mass Index (BMI) 40.0 and over, adult: Secondary | ICD-10-CM

## 2023-06-03 DIAGNOSIS — E669 Obesity, unspecified: Secondary | ICD-10-CM

## 2023-06-03 DIAGNOSIS — M7711 Lateral epicondylitis, right elbow: Secondary | ICD-10-CM

## 2023-06-03 DIAGNOSIS — Z6833 Body mass index (BMI) 33.0-33.9, adult: Secondary | ICD-10-CM

## 2023-06-03 DIAGNOSIS — E611 Iron deficiency: Secondary | ICD-10-CM

## 2023-06-03 MED ORDER — TIRZEPATIDE-WEIGHT MANAGEMENT 12.5 MG/0.5ML ~~LOC~~ SOAJ: 12.5000 mg | SUBCUTANEOUS | 0 refills | Status: DC

## 2023-06-03 MED ORDER — TIRZEPATIDE-WEIGHT MANAGEMENT 12.5 MG/0.5ML ~~LOC~~ SOAJ: 12.5000 mg | SUBCUTANEOUS | 1 refills | Status: DC

## 2023-06-03 MED ORDER — METFORMIN HCL 500 MG PO TABS
500.0000 mg | ORAL_TABLET | Freq: Every day | ORAL | 0 refills | Status: DC
Start: 2023-06-03 — End: 2023-08-14

## 2023-06-03 NOTE — Patient Instructions (Addendum)
 Thank you for coming in today.   Home Exercises provided for tennis elbow, you can look them up on YouTube as well.   Please use Voltaren gel (Generic Diclofenac Gel) up to 4x daily for pain as needed.  This is available over-the-counter as both the name brand Voltaren gel and the generic diclofenac gel.   Try a cubital tunnel elbow brace.   Therband Flexbar.   Recheck in 1 month especially if not better.   Exercises for Tennis Elbow Elbow exercises can help you get better if you have tennis elbow. Only do the exercises you were told to do. Make sure you know how to do the exercises safely. Follow the steps below. It's normal to feel mild discomfort. Stop if you feel pain or your pain gets worse. Do not start these exercises until told by your health care provider. Stretching and range-of-motion exercises These exercises warm up your muscles and joints. They can help your elbow move better and be more flexible. Wrist flexion, assisted  Straighten your left / right elbow in front of you with your palm facing down toward the floor. If told by your provider, bend your left / right elbow to a 90-degree angle (right angle) at your side. Do this instead of holding it straight. With your other hand, gently push over the back of your left / right hand so your fingers point toward the floor. Stop when you feel a gentle stretch on the back of your forearm. Hold this position for __________ seconds. Repeat __________ times. Do this exercise __________ times a day. Wrist extension, assisted  Straighten your left / right elbow in front of you with your palm facing up toward the ceiling. If told by your provider, bend your left / right elbow to a 90-degree angle at your side. Do this instead of holding it straight. With your other hand, gently pull your left / right hand and fingers toward the floor. Stop when you feel a gentle stretch on the palm side of your forearm. Hold this position for  __________ seconds. Repeat __________ times. Do this exercise __________ times a day. Assisted forearm rotation, supination  Sit or stand with your elbows at your side. Bend your left / right elbow to a 90-degree angle. Using your uninjured hand, turn your left / right palm up toward the ceiling. Stop when you feel a gentle stretch along the inside of your forearm. Hold this position for __________ seconds. Repeat __________ times. Do this exercise __________ times a day. Assisted forearm rotation, pronation  Sit or stand with your elbows at your side. Bend your left / right elbow to a 90-degree angle. Using your uninjured hand, turn your left / right palm down toward the floor. Stop when you feel a gentle stretch along the outside of your forearm. Hold this position for __________ seconds. Repeat __________ times. Do this exercise __________ times a day. Strengthening exercises These exercises build strength and endurance in your forearm and elbow. Endurance is the ability to use your muscles for a long time, even after they get tired. Radial deviation  Stand with a __________ weight or a hammer in your left / right hand. Or, sit while holding a rubber exercise band or tubing, with your left / right forearm supported on a table or counter. Position your forearm so your thumb faces the ceiling, as if you're going to clap your hands. Raise your hand up in front of you so your thumb moves toward the ceiling,  or pull up on the rubber tubing. Keep your forearm and elbow still. Only move your wrist. Hold this position for __________ seconds. Slowly go back to the starting position. Repeat __________ times. Do this exercise __________ times a day. Wrist extension, eccentric  Sit with your left / right forearm palm-down and supported on a table or other surface. Let your left / right wrist extend over the edge of the surface. Hold a __________ weight or a piece of exercise band or tubing in  your left / right hand. If using a rubber exercise band or tubing, hold the other end of the tubing with your other hand. Use your uninjured hand to move your left / right hand up toward the ceiling. Take your uninjured hand away. Slowly go back to the starting position using only your left / right hand. Repeat __________ times. Do this exercise __________ times a day. Wrist extension Do not do this exercise if it causes pain at the outside of your elbow. Only do this exercise if told. Sit with your left / right forearm supported on a table or other surface and your palm turned down toward the floor. Let your left / right wrist extend over the edge of the surface. Hold a __________ weight or a piece of rubber exercise band or tubing. If using a rubber exercise band or tubing, hold the band or tubing in place with your other hand to provide resistance. Slowly bend your wrist so your hand moves up toward the ceiling. Move only your wrist. Keep your forearm and elbow still. Hold this position for __________ seconds. Slowly go back to the starting position. Repeat __________ times. Do this exercise __________ times a day. Forearm rotation, supination To do this exercise, you'll need a lightweight hammer or rubber mallet. Sit with your left / right forearm supported on a table or other surface. Bend your elbow to a 90-degree angle. Position your forearm so that your palm faces down toward the floor, with your hand resting over the edge of the table. Hold a hammer in your left / right hand. To make this exercise easier, hold the hammer near the head of the hammer. To make this exercise harder, hold the hammer near the end of the handle. Without moving your wrist or elbow, slowly turn your forearm so your palm faces up toward the ceiling. Hold this position for __________ seconds. Slowly go back to the starting position. Repeat __________ times. Do this exercise __________ times a day. Shoulder  blade squeeze  Sit in a stable chair or stand with good posture. If you're sitting down, don't let your back touch the back of the chair. Your arms should be at your sides with your elbows bent to a 90-degree angle. Position your forearms so that your thumbs face the ceiling. Without lifting your shoulders up, squeeze your shoulder blades tightly together. Hold this position for __________ seconds. Slowly release. Go back to the starting position. Repeat __________ times. Do this exercise __________ times a day. This information is not intended to replace advice given to you by your health care provider. Make sure you discuss any questions you have with your health care provider. Document Revised: 07/26/2022 Document Reviewed: 07/26/2022 Elsevier Patient Education  2024 [employer].

## 2023-06-03 NOTE — Progress Notes (Signed)
 Office: [phone]  /  Fax: [phone]  WEIGHT SUMMARY AND BIOMETRICS  Anthropometric Measurements Height: 5' 5.5" (1.664 m) Weight: 207 lb (93.9 kg) BMI (Calculated): 33.91 Weight at Last Visit: 209 lb Weight Lost Since Last Visit: 2 lb Weight Gained Since Last Visit: 0 Starting Weight: 250 lb Total Weight Loss (lbs): 43 lb (19.5 kg) Peak Weight: 260 lb   Body Composition  Body Fat %: 41 % Fat Mass (lbs): 85 lbs Muscle Mass (lbs): 116.2 lbs Total Body Water  (lbs): 84.2 lbs Visceral Fat Rating : 9   Other Clinical Data Fasting: no Labs: no Today's Visit #: 27 Starting Date: 03/29/21    Chief Complaint: OBESITY    History of Present Illness Charlene [name] is a 43 year old female with obesity who presents for obesity treatment and progress assessment.  She is following a category two eating plan with approximately 80% adherence and has lost two pounds over the last three to four weeks. She is not currently engaging in regular exercise due to stress and lack of structure.  She experiences gastrointestinal issues, including intermittent diarrhea and constipation. She recently restarted Cosentyx and has been on Zepbound  for a while without prior issues. The diarrhea is sometimes loose and occasionally watery, but not debilitating.  She is currently taking Zepbound  12.5 mg and metformin , which she believes she has a full bottle of. She is also on Vyvanse for fatigue, noting it is less effective than before. She was previously on modafinil but switched back to Vyvanse.  She feels sluggish and experiences eye discomfort. She has a history of iron  deficiency anemia and has had her iron  levels checked recently by a hematologist. She is concerned about her overall health and fatigue, noting poor sleep due to stress related to her daughter's education and financial concerns.  She uses a CPAP machine for sleep apnea and has experienced improvement in the past when using it  regularly. Despite previous exercise efforts, she reports feeling weaker.      PHYSICAL EXAM:  Blood pressure 111/74, pulse 92, temperature 98.2 F (36.8 C), height 5' 5.5" (1.664 m), weight 207 lb (93.9 kg), last menstrual period 05/03/2023, SpO2 98%. Body mass index is 33.92 kg/m.  DIAGNOSTIC DATA REVIEWED:  BMET    Component Value Date/Time   NA 139 10/09/2022 0956   K 4.0 10/09/2022 0956   CL 101 10/09/2022 0956   CO2 24 10/09/2022 0956   GLUCOSE 79 10/09/2022 0956   GLUCOSE 92 08/14/2022 1304   GLUCOSE 93 11/24/2013 1126   BUN 11 10/09/2022 0956   CREATININE 0.59 10/09/2022 0956   CREATININE 0.71 08/14/2022 1304   CREATININE 0.43 (L) 12/31/2013 1542   CALCIUM  9.4 10/09/2022 0956   GFRNONAA >60 08/14/2022 1304   GFRAA >60 12/02/2014 1120   Lab Results  Component Value Date   HGBA1C 5.5 10/09/2022   HGBA1C 4.9 05/05/2018   Lab Results  Component Value Date   INSULIN  9.2 10/09/2022   INSULIN  15.6 03/29/2021   Lab Results  Component Value Date   TSH 0.744 10/09/2022   CBC    Component Value Date/Time   WBC 6.5 05/22/2023 1047   WBC 6.1 04/25/2022 1206   RBC 4.65 05/22/2023 1047   HGB 15.0 05/22/2023 1047   HGB 13.9 10/09/2022 0956   HCT 41.6 05/22/2023 1047   HCT 44.2 10/09/2022 0956   PLT 313 05/22/2023 1047   PLT 324 10/09/2022 0956   MCV 89.5 05/22/2023 1047   MCV 91 10/09/2022  0956   MCH 32.3 05/22/2023 1047   MCHC 36.1 (H) 05/22/2023 1047   RDW 11.9 05/22/2023 1047   RDW 16.9 (H) 10/09/2022 0956   Iron  Studies    Component Value Date/Time   IRON  72 05/22/2023 1047   IRON  108 10/09/2022 0956   TIBC 361 05/22/2023 1047   TIBC 331 10/09/2022 0956   FERRITIN 31 05/22/2023 1047   FERRITIN 112 10/09/2022 0956   IRONPCTSAT 20 05/22/2023 1047   IRONPCTSAT 33 10/09/2022 0956   IRONPCTSAT 12 (L) 04/25/2022 1206   Lipid Panel     Component Value Date/Time   CHOL 275 (H) 10/09/2022 0956   TRIG 345 (H) 10/09/2022 0956   HDL 43 10/09/2022 0956    CHOLHDL 3 06/17/2020 1139   VLDL 27.6 06/17/2020 1139   LDLCALC 166 (H) 10/09/2022 0956   LDLDIRECT 236.0 12/22/2019 1131   Hepatic Function Panel     Component Value Date/Time   PROT 6.8 10/09/2022 0956   ALBUMIN 4.6 10/09/2022 0956   AST 20 10/09/2022 0956   AST 17 08/14/2022 1304   ALT 17 10/09/2022 0956   ALT 15 08/14/2022 1304   ALKPHOS 69 10/09/2022 0956   BILITOT <0.2 10/09/2022 0956   BILITOT 0.3 08/14/2022 1304   BILIDIR 0.0 04/25/2022 1206      Component Value Date/Time   TSH 0.744 10/09/2022 0956   Nutritional Lab Results  Component Value Date   VD25OH 52.1 09/05/2022   VD25OH 68.7 03/28/2022   VD25OH 68.0 11/08/2021     Assessment and Plan Assessment & Plan Fatigue Persistent fatigue with recent symptom exacerbation. Potential contributing factors include iron  deficiency anemia, medication effects, and sleep disturbances. She reports sluggishness and eye discomfort. Medication tolerance may affect energy levels. - Order comprehensive lab work including iron  studies, vitamin levels, A1c, insulin , thyroid , cholesterol, electrolytes, kidney, and liver function tests  Iron  deficiency anemia Iron  deficiency anemia with recent fatigue and eye discomfort. Recent iron  studies were conducted at the hematologist's office, but she desires close monitoring due to past issues. - Include iron  studies in comprehensive lab work  Vit D deficiency -Check labs and follow  Obesity and Polyphagia Obesity management with category two eating plan. She adheres to the plan 80% of the time and has lost two pounds in the last three to four weeks. No current exercise regimen. Discussed the importance of structured exercise to address muscle loss and improve overall health. Encouraged adherence to the eating plan and strategizing for challenging situations. - Continue category two eating plan - Initiate structured exercise regimen - Refill Zepbound   prescription  Prediabetes Prediabetes managed with metformin . She has a full bottle of metformin  but agreed to refill the prescription to avoid running out. Discussed metformin  flexibility and potential discontinuation based on future lab results. - Refill metformin  prescription - Plan for fasting labs at next appointment to reassess need for metformin     She was informed of the importance of frequent follow up visits to maximize her success with intensive lifestyle modifications for her multiple health conditions.    Jasmine Mesi, MD

## 2023-06-04 DIAGNOSIS — M7711 Lateral epicondylitis, right elbow: Secondary | ICD-10-CM | POA: Insufficient documentation

## 2023-06-06 ENCOUNTER — Ambulatory Visit: Payer: Self-pay | Admitting: Neurology

## 2023-06-06 ENCOUNTER — Other Ambulatory Visit: Payer: Self-pay | Admitting: *Deleted

## 2023-06-06 ENCOUNTER — Encounter: Payer: Self-pay | Admitting: Neurology

## 2023-06-06 ENCOUNTER — Ambulatory Visit
Admission: RE | Admit: 2023-06-06 | Discharge: 2023-06-06 | Disposition: A | Discharge status: Home / Self Care | Source: Ambulatory Visit | Attending: Neurology | Admitting: Neurology

## 2023-06-06 DIAGNOSIS — G35 Multiple sclerosis: Secondary | ICD-10-CM

## 2023-06-06 MED ORDER — GADOPICLENOL 0.5 MMOL/ML IV SOLN
9.0000 mL | Freq: Once | INTRAVENOUS | Status: AC | PRN
  Administered 2023-06-06: 9 mL via INTRAVENOUS

## 2023-06-06 MED ORDER — ALPRAZOLAM 1 MG PO TABS: ORAL_TABLET | ORAL | 0 refills | Status: DC

## 2023-06-06 NOTE — Progress Notes (Signed)
 Please e-scribe. MRI at 1:50pm today. Pt wanting to pick up before hand. Dr. Godwin Lat pt. You are WID this am.   I called pt and explained directions and that she must have driver to and from test.  Pharmacy: [facility], [place]

## 2023-06-06 NOTE — Telephone Encounter (Signed)
 I called pt. See rx encounter from 06/06/23

## 2023-06-06 NOTE — Progress Notes (Signed)
 Meds ordered this encounter  Medications   ALPRAZolam  (XANAX ) 1 MG tablet    Sig: Take 1-2 pills about 30 min prior to the MRI. Can take additional tablet at the time of test if needed.Can cause drowsiness. Must have driver to and from the test    Dispense:  2 tablet    Refill:  0

## 2023-06-24 ENCOUNTER — Ambulatory Visit: Payer: 59 | Admitting: Neurology

## 2023-07-01 ENCOUNTER — Other Ambulatory Visit: Payer: Self-pay

## 2023-07-01 ENCOUNTER — Encounter: Payer: Self-pay | Admitting: Family Medicine

## 2023-07-01 ENCOUNTER — Ambulatory Visit (INDEPENDENT_AMBULATORY_CARE_PROVIDER_SITE_OTHER): Admitting: Family Medicine

## 2023-07-01 ENCOUNTER — Ambulatory Visit (INDEPENDENT_AMBULATORY_CARE_PROVIDER_SITE_OTHER)

## 2023-07-01 VITALS — BP 108/82 | HR 74 | Ht 65.5 in | Wt 203.0 lb

## 2023-07-01 DIAGNOSIS — M7711 Lateral epicondylitis, right elbow: Secondary | ICD-10-CM | POA: Diagnosis not present

## 2023-07-01 DIAGNOSIS — G8929 Other chronic pain: Secondary | ICD-10-CM

## 2023-07-01 DIAGNOSIS — M25561 Pain in right knee: Secondary | ICD-10-CM

## 2023-07-01 DIAGNOSIS — M25521 Pain in right elbow: Secondary | ICD-10-CM

## 2023-07-01 NOTE — Progress Notes (Signed)
 I, Miquel Amen, CMA acting as a scribe for Garlan Juniper, MD.  Charlene [name] is a 43 y.o. female who presents to [facility] today for f/u R elbow pain. Pt was last seen by Dr. Alease Hunter on 06/03/23 and was advised to use Voltaren gel, and taught HEP.   Today, pt reports continued elbow pain. Short-term relief with Voltaren Gel at bedtime. Sx worse at night, causing night disturbance. Also taking IBU at bedtime. Compliant with HEP, still looking for the correct band. Sx worse with lifting and extending the arm. Occasional sharp pain. Pain and swelling in the thumb.   Also c/o visible, solid, palpable nodule at the anterior aspect of the knee, distal to patella. Non-painful most of the time. Has not changed in size.   Pertinent review of systems: No fevers or chills  Relevant historical information: Multiple sclerosis   Exam:  BP 108/82   Pulse 74   Ht 5' 5.5 (1.664 m)   Wt 203 lb (92.1 kg)   SpO2 96%   BMI 33.27 kg/m  General: Well Developed, well nourished, and in no acute distress.   MSK: Right elbow normal-appearing Tender palpation lateral epicondyle normal motion.  Intact strength.  Pain with resisted wrist and finger extension.  Right knee: Visible nodule anterior knee at anterior proximal tibia at patellar tendon insertion.  Mildly tender to palpation.  Normal knee motion and strength without pain.    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  No significant tendon disruption is visible. 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 Circular nodule within the distal patellar tendon  could represent an old osteophyte fracture. Minimal DJD. Await formal radiology review  Assessment and Plan: 43 y.o. female with right lateral epicondylitis not improving with home exercise program.  Plan to refer to occupational therapy.  Consider shockwave or injection in the future if not better.  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  Watchful waiting for now.   PDMP not reviewed this encounter. Orders Placed This Encounter  Procedures   US  LIMITED JOINT SPACE STRUCTURES UP RIGHT(NO LINKED CHARGES)    Reason for Exam (SYMPTOM  OR DIAGNOSIS REQUIRED):   right elbow pain    Preferred imaging location?:   [facility] Knee AP/LAT W/Sun[place] Right    Standing Status:   Future    Number of Occurrences:   1    Expiration Date:   07/31/2023    Reason for Exam (SYMPTOM  OR DIAGNOSIS REQUIRED):   right knee pain    Preferred imaging location?:   [facility]    Is patient pregnant?:   No   Ambulatory referral to Occupational Therapy    Referral Priority:   Routine    Referral Type:   Occupational Therapy    Referral Reason:   Specialty Services Required    Requested Specialty:   Occupational Therapy    Number of Visits Requested:   1   No orders of the defined types were placed in this encounter.    Discussed warning signs or symptoms. Please see discharge instructions. Patient expresses understanding.   The above documentation has been reviewed and is  accurate and complete Garlan Juniper, M.D.

## 2023-07-01 NOTE — Patient Instructions (Signed)
 Thank you for coming in today.   Please get an Xray today before you leave   I've referred you to Occupational Therapy.  Let us  know if you don't hear from them in one week.   Let me know how you are doing in about a month

## 2023-07-02 LAB — CMP14+EGFR
ALT: 14 IU/L (ref 0–32)
AST: 18 IU/L (ref 0–40)
Albumin: 4.6 g/dL (ref 3.9–4.9)
Alkaline Phosphatase: 65 IU/L (ref 44–121)
BUN/Creatinine Ratio: 17 (ref 9–23)
BUN: 11 mg/dL (ref 6–24)
Bilirubin Total: 0.3 mg/dL (ref 0.0–1.2)
CO2: 21 mmol/L (ref 20–29)
Calcium: 9.5 mg/dL (ref 8.7–10.2)
Chloride: 104 mmol/L (ref 96–106)
Creatinine, Ser: 0.65 mg/dL (ref 0.57–1.00)
Globulin, Total: 2.2 g/dL (ref 1.5–4.5)
Glucose: 83 mg/dL (ref 70–99)
Potassium: 3.8 mmol/L (ref 3.5–5.2)
Sodium: 142 mmol/L (ref 134–144)
Total Protein: 6.8 g/dL (ref 6.0–8.5)
eGFR: 112 mL/min/{1.73_m2} (ref >59)

## 2023-07-02 LAB — FERRITIN: Ferritin: 36 ng/mL (ref 15–150)

## 2023-07-02 LAB — IRON AND TIBC
Iron Saturation: 21 % (ref 15–55)
Iron: 57 ug/dL (ref 27–159)
Total Iron Binding Capacity: 271 ug/dL (ref 250–450)
UIBC: 214 ug/dL (ref 131–425)

## 2023-07-02 LAB — VITAMIN B12: Vitamin B-12: 367 pg/mL (ref 232–1245)

## 2023-07-02 LAB — HEMOGLOBIN A1C
Est. average glucose Bld gHb Est-mCnc: 94 mg/dL
Hgb A1c MFr Bld: 4.9 % (ref 4.8–5.6)

## 2023-07-02 LAB — LIPID PANEL WITH LDL/HDL RATIO
Cholesterol, Total: 169 mg/dL (ref 100–199)
HDL: 49 mg/dL (ref >39)
LDL Chol Calc (NIH): 103 mg/dL — ABNORMAL HIGH (ref 0–99)
LDL/HDL Ratio: 2.1 ratio (ref 0.0–3.2)
Triglycerides: 89 mg/dL (ref 0–149)
VLDL Cholesterol Cal: 17 mg/dL (ref 5–40)

## 2023-07-02 LAB — CBC WITH DIFFERENTIAL/PLATELET
Basophils Absolute: 0 10*3/uL (ref 0.0–0.2)
Basos: 1 %
EOS (ABSOLUTE): 0.4 10*3/uL (ref 0.0–0.4)
Eos: 7 %
Hematocrit: 43.6 % (ref 34.0–46.6)
Hemoglobin: 14.5 g/dL (ref 11.1–15.9)
Immature Grans (Abs): 0 10*3/uL (ref 0.0–0.1)
Immature Granulocytes: 0 %
Lymphocytes Absolute: 1 10*3/uL (ref 0.7–3.1)
Lymphs: 18 %
MCH: 31.9 pg (ref 26.6–33.0)
MCHC: 33.3 g/dL (ref 31.5–35.7)
MCV: 96 fL (ref 79–97)
Monocytes Absolute: 0.5 10*3/uL (ref 0.1–0.9)
Monocytes: 10 %
Neutrophils Absolute: 3.5 10*3/uL (ref 1.4–7.0)
Neutrophils: 64 %
Platelets: 357 10*3/uL (ref 150–450)
RBC: 4.54 x10E6/uL (ref 3.77–5.28)
RDW: 12.7 % (ref 11.7–15.4)
WBC: 5.4 10*3/uL (ref 3.4–10.8)

## 2023-07-02 LAB — VITAMIN D 25 HYDROXY (VIT D DEFICIENCY, FRACTURES): Vit D, 25-Hydroxy: 69.3 ng/mL (ref 30.0–100.0)

## 2023-07-02 LAB — INSULIN, RANDOM: INSULIN: 7.2 u[IU]/mL (ref 2.6–24.9)

## 2023-07-02 LAB — FOLATE: Folate: 5.5 ng/mL (ref >3.0)

## 2023-07-02 LAB — TSH: TSH: 0.463 u[IU]/mL (ref 0.450–4.500)

## 2023-07-05 ENCOUNTER — Ambulatory Visit: Payer: Self-pay | Admitting: Family Medicine

## 2023-07-05 NOTE — Progress Notes (Signed)
Right knee x-ray looks normal to radiology

## 2023-07-08 NOTE — Therapy (Incomplete)
 OUTPATIENT OCCUPATIONAL THERAPY ORTHO EVALUATION  Patient Name: Charlene [name] MRN: [id number] DOB:May 12, 1980, 43 y.o., female Today's Date: 07/08/2023  PCP: Mahlon BEAL MD REFERRING PROVIDER:  Joane Artist RAMAN, MD    END OF SESSION:   Past Medical History:  Diagnosis Date   Alcoholism (HCC)    Anxiety    Anxiety and depression    Bipolar 2 disorder (HCC)    Borderline personality disorder (HCC)    Chest pain    Chronic fatigue syndrome    Claustrophobia    on meds   Constipation    Depression    on meds   Elevated blood sugar 06/08/2014   Elevated LFTs    Fatigue    Fatty liver 10/2014   GERD (gastroesophageal reflux disease)    diet related   Heart murmur    Hyperlipidemia    on meds   Infertility associated with anovulation    Joint pain    Low grade squamous intraepithelial lesion (LGSIL) on Pap smear 10/30/2011   Major depressive disorder    Migraines    MS (multiple sclerosis) (HCC)    Neuromuscular disorder (HCC)    Obesity    Obesity in pregnancy, antepartum    PCOS (polycystic ovarian syndrome)    Personal history of pre-term labor    Prediabetes    Pregnancy induced hypertension    pregnancy related- hx of   Psoriasis    Sleep apnea    SOB (shortness of breath)    Vitamin D  deficiency    Past Surgical History:  Procedure Laterality Date   COLONOSCOPY  10/17/2020   DILATION AND CURETTAGE OF UTERUS  08/15/2008   DILATION AND EVACUATION  12/29/2011   Procedure: DILATATION AND EVACUATION;  Surgeon: Winton Felt, MD;  Location: [facility];  Service: Gynecology;;  Dr, Edsel transferred case to Dr. Felt   LAPAROSCOPIC GASTRIC SLEEVE RESECTION N/A 12/07/2014   Procedure: LAPAROSCOPIC GASTRIC SLEEVE RESECTION;  Surgeon: Alm Angle, MD;  Location: [facility];  Service: General;  Laterality: N/A;   Patient Active Problem List   Diagnosis Date Noted   Lateral epicondylitis, right elbow 06/04/2023   Anxiety 05/13/2023   Prediabetes 05/13/2023    Constipation, chronic 12/06/2022   Encounter for annual routine gynecological examination 11/28/2022   Polyphagia 05/10/2022   High risk medication use 03/28/2022   BMI 34.0-34.9,adult 03/15/2022   Status post laparoscopic sleeve gastrectomy (2016) 02/15/2022   Iron  deficiency anemia 02/15/2022   Notalgia 01/25/2022   Stress 01/25/2022   Insulin  resistance 11/29/2021   Elevated LFTs 11/08/2021   Pre-hypertension 10/17/2021   Vitamin D  deficiency 10/17/2021   SOBOE (shortness of breath on exertion) 10/17/2021   Other hyperlipidemia 08/09/2021   Rosacea 09/15/2020   Physical exam 06/16/2020   Alcohol abuse 05/01/2018   Bipolar disorder (HCC) 06/08/2016   Borderline personality disorder (HCC) 06/08/2016   Obesity (BMI 30-39.9) 12/07/2014   Hyperlipemia 06/08/2014   Elevated blood sugar 06/08/2014   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  Rationale for Evaluation and Treatment: Rehabilitation  SUBJECTIVE:   SUBJECTIVE STATEMENT: She states ***.   Pt accompanied by: {accompnied:27141}  PERTINENT HISTORY: Rt Lat epi, was on MD program   PRECAUTIONS: {Therapy precautions:24002}  RED FLAGS: {PT Red Flags:29287}   WEIGHT BEARING RESTRICTIONS: {Yes ***/No:24003}  PAIN:  Are you having pain? {OPRCPAIN:27236}  FALLS: Has patient fallen in last 6 months? {fallsyesno:27318}  LIVING ENVIRONMENT: Lives with: {OPRC lives with:25569::lives with their family} Lives in: {Lives in:25570} Stairs: {opstairs:27293} Has following equipment at home: {Assistive devices:23999}  PLOF: {PLOF:24004}  PATIENT GOALS: ***  NEXT MD VISIT: ***   OBJECTIVE: (All objective assessments below are from  initial evaluation on: 07/10/23 unless otherwise specified.)   HAND DOMINANCE: Right ***  ADLs: Overall ADLs: States decreased ability to grab, hold household objects, pain and difficulty to open containers, perform FMS tasks (manipulate fasteners on clothing), mild to moderate bathing problems as well. ***   FUNCTIONAL OUTCOME MEASURES: Eval: Patient Specific Functional Scale: *** (***, ***, ***)  (Higher Score  =  Better Ability for the Selected Tasks)     Quick DASH ***% impairment today  (Higher % Score  =  More Impairment)     Patient Rated Wrist Evaluation (PRWE): Pain: ***/50; Function: ***/50; Total Score: ***/100 (Higher Score  =  More Pain and/or Debility)    UPPER EXTREMITY ROM     Shoulder to Wrist AROM Right eval Left eval  Shoulder flexion    Shoulder abduction    Shoulder extension    Shoulder internal rotation    Shoulder external rotation    Elbow flexion    Elbow extension    Forearm supination    Forearm pronation     Wrist flexion    Wrist extension    Wrist ulnar deviation    Wrist radial deviation    Functional dart thrower's motion (F-DTM) in ulnar flexion    F-DTM in radial extension     (Blank rows = not tested)   Hand AROM Right eval Left eval  Full Fist Ability (or Gap to Distal Palmar Crease)    Thumb Opposition  (Kapandji Scale)     Thumb MCP (0-60)    Thumb IP (0-80)    Thumb Radial Abduction Span     Thumb Palmar Abduction Span     Index MCP (0-90)     Index PIP (0-100)     Index DIP (0-70)      Long MCP (0-90)      Long PIP (0-100)      Long DIP (0-70)      Ring MCP (0-90)      Ring PIP (0-100)      Ring DIP (0-70)      Little MCP (0-90)      Little PIP (0-100)      Little DIP (0-70)      (Blank rows = not tested)   UPPER EXTREMITY MMT:    Eval: *** NT at eval due to recent and still healing injuries. Will be tested when appropriate.   MMT Right TBD Left TBD  Shoulder flexion    Shoulder abduction    Shoulder  adduction    Shoulder extension    Shoulder internal rotation    Shoulder external rotation    Middle trapezius    Lower trapezius    Elbow flexion    Elbow extension    Forearm supination    Forearm pronation    Wrist flexion    Wrist extension    Wrist ulnar deviation    Wrist radial deviation    (Blank rows = not tested)  HAND FUNCTION: Eval: Observed weakness in affected *** hand.  Grip strength Right: *** lbs,  Left: *** lbs   COORDINATION: Eval: Observed coordination impairments with affected *** hand. Box and Blocks Test: *** Blocks today (*** is [facility]); 9 Hole Peg Test Right: ***sec, Left: *** sec (*** sec is WFL)   SENSATION: Eval:  Light touch intact today,   *** though diminished around sx area    EDEMA:   Eval: *** Mildly swollen in *** hand and wrist today, ***cm circumferentially around ***  COGNITION: Eval: Overall cognitive status: WFL for evaluation today ***  OBSERVATIONS:   Eval: ***   TODAY'S TREATMENT:  Post-evaluation treatment: ***   Modalities: {OPRCMODALITIES:31717}  PATIENT EDUCATION: Education details: See tx section above for details  Person educated: Patient Education method: Engineer, structural, Teach back, Handouts  Education comprehension: States and demonstrates understanding, Additional Education required    HOME EXERCISE PROGRAM: See tx section above for details    GOALS: Goals reviewed with patient? Yes   SHORT TERM GOALS: (STG required if POC>30 days) Target Date: ***  Pt will obtain protective, custom orthotic. Goal status: TBD/PRN,  MET ***  2.  Pt will demo/state understanding of initial HEP to improve pain levels and prerequisite motion. Goal status: INITIAL   LONG TERM GOALS: Target Date: ***  Pt will improve functional ability by decreased impairment per Quick DASH / PSFS / PRWE assessment from *** to *** or better, for better quality of life. Goal status: INITIAL  2.  Pt will improve grip strength in  *** hand from ***lbs to at least ***lbs for functional use at home and in IADLs. Goal status: INITIAL  3.  Pt will improve A/ROM in *** from *** to at least ***, to have functional motion for tasks like reach and grasp.  Goal status: INITIAL  4.  Pt will improve strength in *** from *** MMT to at least *** MMT to have increased functional ability to carry out selfcare and higher-level homecare tasks with less difficulty. Goal status: INITIAL  5.  Pt will improve coordination skills in ***, as seen by within functional limit score on *** testing to have increased functional ability to carry out fine motor tasks (fasteners, etc.) and more complex, coordinated IADLs (meal prep, sports, etc.).  Goal status: INITIAL  6.  Pt will decrease pain at worst from ***/10 to ***/10 or better to have better sleep and occupational participation in daily roles. Goal status: INITIAL   ASSESSMENT:  CLINICAL IMPRESSION: Patient is a *** y.o. *** who was seen today for occupational therapy evaluation for ***.  The patient will benefit from outpatient occupational therapy to decrease symptoms, improve functional upper extremity use, and increase quality of life.  PERFORMANCE DEFICITS: in functional skills including {OT physical skills:25468}, cognitive skills including problem solving and safety awareness, and psychosocial skills including coping strategies, environmental adaptation, habits, and routines and behaviors.   IMPAIRMENTS: are limiting patient from ADLs, IADLs, rest and sleep, and leisure.   COMORBIDITIES: {Comorbidities:25485} that affects occupational performance. Patient will benefit from skilled OT to address above impairments and improve overall function.  MODIFICATION OR ASSISTANCE TO COMPLETE EVALUATION: {OT modification:25474}  OT OCCUPATIONAL PROFILE AND HISTORY: {OT PROFILE AND HISTORY:25484}  CLINICAL DECISION MAKING: {OT CDM:25475}  REHAB POTENTIAL:  {rehabpotential:25112}  EVALUATION COMPLEXITY: {Evaluation complexity:25115}      PLAN:  OT FREQUENCY: 1-2x/week  OT DURATION: *** weeks through *** and up to *** total visits as needed   PLANNED INTERVENTIONS: 97535 self care/ADL training, 02889 therapeutic exercise, 97530 therapeutic activity, 97112 neuromuscular re-education, 97140 manual therapy,  02964 ultrasound, 02967 electrical stimulation (manual), [id number] Orthotic Initial, [id number] Orthotic/Prosthetic subsequent, compression bandaging, Dry needling, energy conservation, coping strategies training, and patient/family education  RECOMMENDED OTHER SERVICES: none now  ***  CONSULTED AND AGREED WITH PLAN OF CARE: Patient  PLAN FOR NEXT SESSION:   Review initial HEP and recommendations ***   Melvenia Ada, OTR/L, CHT  07/08/2023, 5:18 PM

## 2023-07-09 ENCOUNTER — Encounter (INDEPENDENT_AMBULATORY_CARE_PROVIDER_SITE_OTHER): Payer: Self-pay | Admitting: Family Medicine

## 2023-07-09 ENCOUNTER — Ambulatory Visit (INDEPENDENT_AMBULATORY_CARE_PROVIDER_SITE_OTHER): Admitting: Family Medicine

## 2023-07-09 VITALS — BP 127/85 | HR 93 | Temp 97.6°F | Ht 65.5 in | Wt 197.0 lb

## 2023-07-09 DIAGNOSIS — K5900 Constipation, unspecified: Secondary | ICD-10-CM

## 2023-07-09 DIAGNOSIS — E88819 Insulin resistance, unspecified: Secondary | ICD-10-CM

## 2023-07-09 DIAGNOSIS — K5909 Other constipation: Secondary | ICD-10-CM

## 2023-07-09 DIAGNOSIS — M791 Myalgia, unspecified site: Secondary | ICD-10-CM

## 2023-07-09 DIAGNOSIS — Z8342 Family history of familial hypercholesterolemia: Secondary | ICD-10-CM

## 2023-07-09 DIAGNOSIS — E7800 Pure hypercholesterolemia, unspecified: Secondary | ICD-10-CM

## 2023-07-09 DIAGNOSIS — E669 Obesity, unspecified: Secondary | ICD-10-CM | POA: Diagnosis not present

## 2023-07-09 DIAGNOSIS — R632 Polyphagia: Secondary | ICD-10-CM

## 2023-07-09 DIAGNOSIS — Z6832 Body mass index (BMI) 32.0-32.9, adult: Secondary | ICD-10-CM

## 2023-07-09 MED ORDER — TIRZEPATIDE-WEIGHT MANAGEMENT 12.5 MG/0.5ML ~~LOC~~ SOAJ
12.5000 mg | SUBCUTANEOUS | 1 refills | Status: DC
Start: 2023-07-09 — End: 2023-08-01

## 2023-07-09 NOTE — Progress Notes (Signed)
 Office: [phone]  /  Fax: [phone]  WEIGHT SUMMARY AND BIOMETRICS  Anthropometric Measurements Height: 5' 5.5 (1.664 m) Weight: 197 lb (89.4 kg) BMI (Calculated): 32.27 Weight at Last Visit: 207 lb Weight Lost Since Last Visit: 10 lb Weight Gained Since Last Visit: 0 Starting Weight: 250 lb Total Weight Loss (lbs): 53 lb (24 kg) Peak Weight: 260 lb   Body Composition  Body Fat %: 38.2 % Fat Mass (lbs): 75.2 lbs Muscle Mass (lbs): 115.6 lbs Total Body Water  (lbs): 83.4 lbs Visceral Fat Rating : 8   Other Clinical Data Fasting: No Labs: No Today's Visit #: 28 Starting Date: 03/29/21    Chief Complaint: OBESITY   Discussed the use of AI scribe software for clinical note transcription with the patient, who gave verbal consent to proceed.  History of Present Illness Charlene [name] is a 43 year old female with obesity who presents for a follow-up on her treatment plan and progress.  She has been following a category two eating plan approximately 85% of the time, resulting in a weight loss of ten pounds over the past five weeks. She attributes part of her success to increased water  intake but has not been engaging in regular exercise. Her polyphagia has significantly improved since starting Zepbound .  She experiences constipation, which she believes is related to her medication regimen, including Zepbound , Trintellix, and Cosentyx. She has tried Miralax without success and has resorted to using an enema during severe episodes. Currently, she uses a combination stool softener with a mild laxative daily but still experiences slow bowel movements and straining. No current bleeding is noted, although she had some during a severe episode.  She has stopped taking metformin  due to a lack of refills and is uncertain about the necessity of continuing it. Her recent blood work was conducted off metformin . She feels sluggish in the mornings and experiences lightheadedness upon  standing.  She is working on increasing her protein intake and has been using AI meal planning to assist with her dietary goals. She is also trying to incorporate more fiber into her diet, particularly through beans and certain fruits. She does not drink coffee but consumes diet soda for caffeine .      PHYSICAL EXAM:  Blood pressure 127/85, pulse 93, temperature 97.6 F (36.4 C), height 5' 5.5 (1.664 m), weight 197 lb (89.4 kg), last menstrual period 06/23/2023, SpO2 98%. Body mass index is 32.28 kg/m.  DIAGNOSTIC DATA REVIEWED:  BMET    Component Value Date/Time   NA 142 07/01/2023 0904   K 3.8 07/01/2023 0904   CL 104 07/01/2023 0904   CO2 21 07/01/2023 0904   GLUCOSE 83 07/01/2023 0904   GLUCOSE 92 08/14/2022 1304   GLUCOSE 93 11/24/2013 1126   BUN 11 07/01/2023 0904   CREATININE 0.65 07/01/2023 0904   CREATININE 0.71 08/14/2022 1304   CREATININE 0.43 (L) 12/31/2013 1542   CALCIUM  9.5 07/01/2023 0904   GFRNONAA >60 08/14/2022 1304   GFRAA >60 12/02/2014 1120   Lab Results  Component Value Date   HGBA1C 4.9 07/01/2023   HGBA1C 4.9 05/05/2018   Lab Results  Component Value Date   INSULIN  7.2 07/01/2023   INSULIN  15.6 03/29/2021   Lab Results  Component Value Date   TSH 0.463 07/01/2023   CBC    Component Value Date/Time   WBC 5.4 07/01/2023 0904   WBC 6.5 05/22/2023 1047   WBC 6.1 04/25/2022 1206   RBC 4.54 07/01/2023 0904   RBC 4.65  05/22/2023 1047   HGB 14.5 07/01/2023 0904   HCT 43.6 07/01/2023 0904   PLT 357 07/01/2023 0904   MCV 96 07/01/2023 0904   MCH 31.9 07/01/2023 0904   MCH 32.3 05/22/2023 1047   MCHC 33.3 07/01/2023 0904   MCHC 36.1 (H) 05/22/2023 1047   RDW 12.7 07/01/2023 0904   Iron  Studies    Component Value Date/Time   IRON  57 07/01/2023 0904   TIBC 271 07/01/2023 0904   FERRITIN 36 07/01/2023 0904   IRONPCTSAT 21 07/01/2023 0904   IRONPCTSAT 12 (L) 04/25/2022 1206   Lipid Panel     Component Value Date/Time   CHOL 169  07/01/2023 0904   TRIG 89 07/01/2023 0904   HDL 49 07/01/2023 0904   CHOLHDL 3 06/17/2020 1139   VLDL 27.6 06/17/2020 1139   LDLCALC 103 (H) 07/01/2023 0904   LDLDIRECT 236.0 12/22/2019 1131   Hepatic Function Panel     Component Value Date/Time   PROT 6.8 07/01/2023 0904   ALBUMIN 4.6 07/01/2023 0904   AST 18 07/01/2023 0904   AST 17 08/14/2022 1304   ALT 14 07/01/2023 0904   ALT 15 08/14/2022 1304   ALKPHOS 65 07/01/2023 0904   BILITOT 0.3 07/01/2023 0904   BILITOT 0.3 08/14/2022 1304   BILIDIR 0.0 04/25/2022 1206      Component Value Date/Time   TSH 0.463 07/01/2023 0904   Nutritional Lab Results  Component Value Date   VD25OH 69.3 07/01/2023   VD25OH 52.1 09/05/2022   VD25OH 68.7 03/28/2022     Assessment and Plan Assessment & Plan Obesity She is adhering to a category two eating plan with 85% compliance, resulting in a 10-pound weight loss over the past five weeks. Although she is not currently exercising, she has increased her water  intake. Zepbound  has improved her polyphagia. Her goal is to reduce her fat percentage to 35% and visceral fat to 8% or below. Her current fat percentage is 38% and visceral fat is at 8%. The target weight is 180-185 pounds, based on fat and muscle composition, rather than outdated ideal body weight charts. - Continue category two eating plan - Refill Zepbound  at 12.5 mg - Monitor fat percentage and visceral fat  Polyphagia Polyphagia has significantly improved with Zepbound . Adherence to her eating plan has contributed to weight loss and better control of polyphagia. - Continue Zepbound  at 12.5 mg - Maintain adherence to eating plan  Constipation Constipation is likely exacerbated by Zepbound  and other medications. She has tried Miralax and a combination stool softener with mild laxative. Constipation is characterized by thick, hard stools requiring straining, but not rabbit pellet-like. A previous episode of severe constipation  required an enema. Daily Miralax is recommended for prevention rather than acute treatment. - Recommend daily Miralax to prevent constipation - Continue stool softener and mild laxative - Increase dietary fiber intake with beans, raspberries, apples with peel, and broccoli - Consider iced coffee as a natural laxative  Insulin  Resistance Insulin  resistance is well-controlled with an A1c of 4.9 and insulin  level of 7.2. She has discontinued metformin  due to well-controlled insulin  resistance and potential B12 absorption issues. B12 deficiency can cause sluggishness and anemia if levels fall below 300. - Discontinue metformin  - Monitor B12 levels and symptoms of deficiency  General Health Maintenance Cholesterol levels have improved, with HDL at 49 and triglycerides significantly reduced. Vitamin D  levels are within the desired range, and iron  studies are normal. B12 levels are falling, possibly due to metformin  use. She  is advised to stay hydrated, especially in hot weather, to prevent dehydration-related symptoms, which can cause low blood pressure and compensatory high heart rate. - Skip one dose of vitamin D  per month - Encourage hydration to prevent dehydration - Monitor cholesterol and vitamin D  levels  Follow-up A follow-up appointment is scheduled for August 20th, with discussions about scheduling adjustments due to vacation plans. - Attend follow-up appointment on August 20th - Discuss double Zepbound  script at next appointment      She was informed of the importance of frequent follow up visits to maximize her success with intensive lifestyle modifications for her multiple health conditions.    Louann Penton, MD

## 2023-07-10 ENCOUNTER — Encounter: Admitting: Rehabilitative and Restorative Service Providers"

## 2023-07-10 ENCOUNTER — Ambulatory Visit (INDEPENDENT_AMBULATORY_CARE_PROVIDER_SITE_OTHER): Admitting: Rehabilitative and Restorative Service Providers"

## 2023-07-10 ENCOUNTER — Encounter: Payer: Self-pay | Admitting: Rehabilitative and Restorative Service Providers"

## 2023-07-10 DIAGNOSIS — M6281 Muscle weakness (generalized): Secondary | ICD-10-CM

## 2023-07-10 DIAGNOSIS — M25531 Pain in right wrist: Secondary | ICD-10-CM

## 2023-07-10 DIAGNOSIS — M25521 Pain in right elbow: Secondary | ICD-10-CM

## 2023-07-10 NOTE — Therapy (Signed)
 OUTPATIENT OCCUPATIONAL THERAPY ORTHO EVALUATION  Patient Name: Charlene [name] MRN: [id number] DOB:[birth year], 43 y.o., female Today's Date: 07/10/2023  PCP: Mahlon BEAL MD REFERRING PROVIDER:  Joane Artist RAMAN, MD    END OF SESSION:  OT End of Session - 07/10/23 0931     Visit Number 1    Number of Visits 12    Date for OT Re-Evaluation 08/23/23    Authorization Type [employer]    OT Start Time 0931    OT Stop Time 1016    OT Time Calculation (min) 45 min    Activity Tolerance Patient tolerated treatment well;No increased pain;Patient limited by fatigue;Patient limited by pain    Behavior During Therapy [facility] for tasks assessed/performed          Past Medical History:  Diagnosis Date   Alcoholism (HCC)    Anxiety    Anxiety and depression    Bipolar 2 disorder (HCC)    Borderline personality disorder (HCC)    Chest pain    Chronic fatigue syndrome    Claustrophobia    on meds   Constipation    Depression    on meds   Elevated blood sugar 06/08/2014   Elevated LFTs    Fatigue    Fatty liver 10/2014   GERD (gastroesophageal reflux disease)    diet related   Heart murmur    Hyperlipidemia    on meds   Infertility associated with anovulation    Joint pain    Low grade squamous intraepithelial lesion (LGSIL) on Pap smear 10/30/2011   Major depressive disorder    Migraines    MS (multiple sclerosis) (HCC)    Neuromuscular disorder (HCC)    Obesity    Obesity in pregnancy, antepartum    PCOS (polycystic ovarian syndrome)    Personal history of pre-term labor    Prediabetes    Pregnancy induced hypertension    pregnancy related- hx of   Psoriasis    Sleep apnea    SOB (shortness of breath)    Vitamin D  deficiency    Past Surgical History:  Procedure Laterality Date   COLONOSCOPY  10/17/2020   DILATION AND CURETTAGE OF UTERUS  08/15/2008   DILATION AND EVACUATION  12/29/2011   Procedure: DILATATION AND EVACUATION;  Surgeon: Winton Felt, MD;  Location: [facility];   Service: Gynecology;;  Dr, Edsel transferred case to Dr. 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   Iron  deficiency anemia 02/15/2022   Notalgia 01/25/2022   Stress 01/25/2022   Insulin  resistance 11/29/2021   Elevated LFTs 11/08/2021   Pre-hypertension 10/17/2021   Vitamin D  deficiency 10/17/2021   SOBOE (shortness of breath on exertion) 10/17/2021   Other hyperlipidemia 08/09/2021   Rosacea 09/15/2020   Physical exam 06/16/2020   Alcohol abuse 05/01/2018   Bipolar disorder (HCC) 06/08/2016   Borderline personality disorder (HCC) 06/08/2016   Obesity (BMI 30-39.9) 12/07/2014   Hyperlipemia 06/08/2014   Elevated blood sugar 06/08/2014   Plaque psoriasis  06/08/2014   Migraines 06/08/2014   Multiple sclerosis (HCC)    History of abnormal cervical Pap smear 10/30/2011   Adjustment disorder with anxiety 01/05/2008   Morbid obesity (HCC) 11/26/2007    ONSET DATE: 3-4 months onset   REFERRING DIAG: M25.521 (ICD-10-CM) - Right elbow pain   THERAPY DIAG:  Muscle weakness (generalized)  Pain in right elbow  Pain in right wrist  Rationale for Evaluation and Treatment: Rehabilitation  SUBJECTIVE:   SUBJECTIVE STATEMENT: She states states insidious onset 3-4 months ago, thought it was psoriatic arthritis, She was put on meds for that, but months later it didn't help, and Dr. Joane dx her with Rt lat epi.   She tried  MD HEP, but did not work for her independently. At night ain is the worst.      PERTINENT HISTORY: Rt Lat epi, was on MD program   PRECAUTIONS: Other: chronic MS (heat sensitive), psoriatic arthritis, and other conditions   RED FLAGS: None   WEIGHT BEARING RESTRICTIONS: No  PAIN:  Are you having pain? Yes: NPRS scale: 1-2/10 at rest but up to 10/10 at worst in past week Pain location: Rt lateral/dorsal forearm and wrist areas  Pain description: aching and sometimes sharp Aggravating factors: reaching out elbow and grabbing objects Relieving factors: rest  FALLS: Has patient fallen in last 6 months? No  LIVING ENVIRONMENT: Lives with: lives with their family Lives in: House/apartment  PLOF: Independent  PATIENT GOALS: To improve pain and symptoms in the right dominant arm  NEXT MD VISIT: As needed   OBJECTIVE: (All objective assessments below are from initial evaluation on: 07/10/23 unless otherwise specified.)   HAND DOMINANCE: Right   ADLs: Overall ADLs: States decreased ability to grab, hold household objects, pain and difficulty to open containers, perform FMS tasks (manipulate fasteners on clothing), mild to moderate bathing problems as well.    FUNCTIONAL OUTCOME MEASURES: Eval: Patient Specific Functional Scale: 2.3 (sleeping, lifting household objects, leaning on elbow/wrist/fa)  (Higher Score  =  Better Ability for the Selected Tasks)      UPPER EXTREMITY ROM     Shoulder to Wrist AROM Right eval  Shoulder flexion   Shoulder abduction   Shoulder extension   Shoulder internal rotation   Shoulder external rotation   Elbow flexion 155  Elbow extension 0  Forearm supination 75  Forearm pronation  85  Wrist flexion 70  (80 in Lt)  Wrist extension 72 (70 in Lt)   Wrist ulnar deviation   Wrist radial deviation   Functional dart thrower's motion (F-DTM) in ulnar flexion   F-DTM in radial extension    (Blank rows = not tested)   Hand AROM Right eval   Full Fist Ability (or Gap to Distal Palmar Crease) full  Thumb Opposition  (Kapandji Scale)  WNL  (Blank rows = not tested)   UPPER EXTREMITY MMT:      MMT Right 07/10/2023  Shoulder flexion   Shoulder abduction   Shoulder adduction   Shoulder extension   Shoulder internal rotation   Shoulder external rotation   Middle trapezius   Lower trapezius   Elbow flexion   Elbow extension   Forearm supination 4+/5  Forearm pronation 5/5  Wrist flexion 5/5 MMT  Wrist extension 4/5 MMT tender  Wrist ulnar deviation 5/5  Wrist radial deviation   (Blank rows = not tested)  HAND FUNCTION: Eval: Observed weakness in affected Rt hand.  Grip strength Right: 41 tender lbs, Left: 64 lbs  COORDINATION: Eval: No coordination issues other than gross motor skills when extending elbow  SENSATION: Eval:  Light touch intact today, no complaints of paresthesia  EDEMA:   Eval: None significant today  COGNITION: Eval: Overall cognitive status: WFL for evaluation today   OBSERVATIONS:   Eval:  she states bicep is someehat sore, ulnar side of wrist is sore, lateral elbow (worst) can be sore and it jumps around   Presents as left lateral epicondylitis   TODAY'S TREATMENT:  Post-evaluation treatment:   For safety/self-care, she was educated to avoid painful postures and positions (extended elbow, pronation, wrist flexion or strenuous wrist extension), and try to limit painful activities for the next 4 to 6 weeks.  She was taught to use a light moist heat and self massage to help loosen tissues and help blood flow and healing.  She was shown a counterforce strap that she could purchase if this helps decrease her pain. Next, she was given the following home exercise program to perform consistently at least 4 times a day.  Each 1 was gone over with her briefly to ensure she understood and that these did not hurt her.  They will need more review in upcoming sessions.   Exercises - Seated  Elbow Manual Massage Clockwise  - 3-4 x daily - 2-3 mins hold - Seated Scapular Retraction  - 3-4 x daily - 5-10 reps - Standing Radial Nerve Glide  - 3-4 x daily - 1 sets - 5-10 reps - Tricep Stretch- DO SEATED BY TABLE  - 3-4 x daily - 3-5 reps - 15 hold - Fridge Door Stretch  - 4 x daily - 3-5 reps - 15 sec hold - Forearm Pronation Stretch  - 3-4 x daily - 3-5 reps - 15 sec hold - Wrist Flexion Stretch  - 3-4 x daily - 3-5 reps - 15 sec hold     PATIENT EDUCATION: Education details: See tx section above for details  Person educated: Patient Education method: Verbal Instruction, Teach back, Handouts  Education comprehension: States and demonstrates understanding, Additional Education required    HOME EXERCISE PROGRAM: Access Code: [email or web address] URL: [email or web address] Date: 07/10/2023 Prepared by: Melvenia Ada   GOALS: Goals reviewed with patient? Yes   SHORT TERM GOALS: (STG required if POC>30 days) Target Date: 07/26/23  Pt will obtain protective, custom orthotic. Goal status: TBD/PRN  2.  Pt will demo/state understanding of initial HEP to improve pain levels and prerequisite motion. Goal status: INITIAL   LONG TERM GOALS: Target Date: 08/23/23  Pt will improve functional ability by decreased impairment per PSFS assessment from 2.3 to 6.5 or better, for better quality of life. Goal status: INITIAL  2.  Pt will improve grip strength in Rt hand from tender 41lbs to at least 55lbs for functional use at home and in IADLs. Goal status: INITIAL  3.  Pt will improve A/ROM in right wrist flexion from 70 degrees to at least 75 degrees, to have functional motion for tasks like reach and grasp.  Goal status: INITIAL  4.  Pt will improve strength in right wrist extension from tender 4/5 MMT to at least 4+/5 MMT to have increased functional ability to carry out selfcare and higher-level homecare tasks with less difficulty. Goal status: INITIAL  5.  Pt will  decrease pain at worst from 10/10 to 3/10 or better to have better sleep and occupational participation in daily roles. Goal status: INITIAL   ASSESSMENT:  CLINICAL IMPRESSION: Patient is a 43 y.o. female  who was seen today for occupational therapy evaluation for soreness, stiffness and tenderness through the right dominant forearm and elbow and wrist areas thought to be lateral epicondylitis in general tendinitis.  The patient will benefit from outpatient occupational therapy to decrease symptoms, improve functional upper extremity use, and increase quality of life.  PERFORMANCE DEFICITS: in functional skills including IADLs, ROM, strength, pain, fascial restrictions, muscle spasms, flexibility, Gross motor control, body mechanics, endurance, and UE functional use, cognitive skills including problem solving and safety awareness, and psychosocial skills including coping strategies, environmental adaptation, habits, and routines and behaviors.   IMPAIRMENTS: are limiting patient from ADLs, IADLs, rest and sleep, and leisure.   COMORBIDITIES: may have co-morbidities  that affects occupational performance. Patient will benefit from skilled OT to address above impairments and improve overall function.  MODIFICATION OR ASSISTANCE TO COMPLETE EVALUATION: No modification of tasks or assist necessary to complete an evaluation.  OT OCCUPATIONAL PROFILE AND HISTORY: Problem focused assessment: Including review of records relating to presenting problem.  CLINICAL DECISION MAKING: LOW - limited treatment options, no task modification necessary  REHAB POTENTIAL: Excellent  EVALUATION COMPLEXITY: Low      PLAN:  OT FREQUENCY: 1-2x/week  OT DURATION: 6 weeks through 08/23/23 and up to 12 total visits as needed   PLANNED INTERVENTIONS: 97535 self care/ADL training, 02889 therapeutic exercise, 97530 therapeutic activity, 97112 neuromuscular re-education, 97140 manual therapy, 97035 ultrasound, 97032  electrical stimulation (manual), 97760 Orthotic Initial, [id number] Orthotic/Prosthetic subsequent, compression bandaging, Dry needling, energy conservation, coping strategies training, and patient/family education  RECOMMENDED OTHER SERVICES: none now    CONSULTED AND AGREED WITH PLAN OF CARE: Patient  PLAN FOR NEXT SESSION:   Review initial HEP and recommendations    Melvenia Ada, OTR/L, CHT  07/10/2023, 10:27 AM

## 2023-07-18 ENCOUNTER — Encounter: Admitting: Rehabilitative and Restorative Service Providers"

## 2023-07-23 ENCOUNTER — Ambulatory Visit: Payer: 59

## 2023-07-23 NOTE — Therapy (Signed)
 OUTPATIENT OCCUPATIONAL THERAPY TREATMENT NOTE  Patient Name: Charlene [name] MRN: [id number] DOB:[birth year], 43 y.o., female Today's Date: 07/23/2023  PCP: Mahlon BEAL MD REFERRING PROVIDER:  Joane Artist RAMAN, MD    END OF SESSION:    Past Medical History:  Diagnosis Date   Alcoholism (HCC)    Anxiety    Anxiety and depression    Bipolar 2 disorder (HCC)    Borderline personality disorder (HCC)    Chest pain    Chronic fatigue syndrome    Claustrophobia    on meds   Constipation    Depression    on meds   Elevated blood sugar 06/08/2014   Elevated LFTs    Fatigue    Fatty liver 10/2014   GERD (gastroesophageal reflux disease)    diet related   Heart murmur    Hyperlipidemia    on meds   Infertility associated with anovulation    Joint pain    Low grade squamous intraepithelial lesion (LGSIL) on Pap smear 10/30/2011   Major depressive disorder    Migraines    MS (multiple sclerosis) (HCC)    Neuromuscular disorder (HCC)    Obesity    Obesity in pregnancy, antepartum    PCOS (polycystic ovarian syndrome)    Personal history of pre-term labor    Prediabetes    Pregnancy induced hypertension    pregnancy related- hx of   Psoriasis    Sleep apnea    SOB (shortness of breath)    Vitamin D  deficiency    Past Surgical History:  Procedure Laterality Date   COLONOSCOPY  10/17/2020   DILATION AND CURETTAGE OF UTERUS  08/15/2008   DILATION AND EVACUATION  12/29/2011   Procedure: DILATATION AND EVACUATION;  Surgeon: Winton Felt, MD;  Location: [facility];  Service: Gynecology;;  Dr, Edsel transferred case to Dr. Felt   LAPAROSCOPIC GASTRIC SLEEVE RESECTION N/A 12/07/2014   Procedure: LAPAROSCOPIC GASTRIC SLEEVE RESECTION;  Surgeon: Alm Angle, MD;  Location: [facility];  Service: General;  Laterality: N/A;   Patient Active Problem List   Diagnosis Date Noted   Lateral epicondylitis, right elbow 06/04/2023   Anxiety 05/13/2023   Prediabetes 05/13/2023   Other  constipation 12/06/2022   Encounter for annual routine gynecological examination 11/28/2022   Polyphagia 05/10/2022   High risk medication use 03/28/2022   BMI 32.0-32.9,adult 03/15/2022   Status post laparoscopic sleeve gastrectomy (2016) 02/15/2022   Iron  deficiency anemia 02/15/2022   Notalgia 01/25/2022   Stress 01/25/2022   Insulin  resistance 11/29/2021   Elevated LFTs 11/08/2021   Pre-hypertension 10/17/2021   Vitamin D  deficiency 10/17/2021   SOBOE (shortness of breath on exertion) 10/17/2021   Other hyperlipidemia 08/09/2021   Rosacea 09/15/2020   Physical exam 06/16/2020   Alcohol abuse 05/01/2018   Bipolar disorder (HCC) 06/08/2016   Borderline personality disorder (HCC) 06/08/2016   Obesity (BMI 30-39.9) 12/07/2014   Hyperlipemia 06/08/2014   Elevated blood sugar 06/08/2014   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   Migraines 06/08/2014   Multiple sclerosis (HCC)    History of abnormal cervical Pap smear 10/30/2011   Adjustment disorder with anxiety 01/05/2008   Morbid obesity (HCC) 11/26/2007    ONSET DATE: 3-4 months onset   REFERRING DIAG: M25.521 (ICD-10-CM) - Right elbow pain   THERAPY DIAG:  No diagnosis found.  Rationale for Evaluation and Treatment: Rehabilitation  PERTINENT HISTORY: Rt Lat epi, was on MD program  She states states insidious onset 3-4 months ago, thought it was psoriatic arthritis, She was  put on meds for that, but months later it didn't help, and Dr. Joane dx her with Rt lat epi.   She tried MD HEP, but did not work for her independently. At night ain is the worst.   PRECAUTIONS: Other: chronic MS (heat sensitive), psoriatic arthritis, and other conditions   RED FLAGS: None   WEIGHT BEARING RESTRICTIONS: No    SUBJECTIVE:   SUBJECTIVE STATEMENT: She states ***.       PAIN:  Are you having pain? Yes: NPRS scale: *** 1-2/10 at rest but up to 10/10 at worst in past week Pain location: Rt lateral/dorsal forearm and wrist areas  Pain  description: aching and sometimes sharp Aggravating factors: reaching out elbow and grabbing objects Relieving factors: rest   PATIENT GOALS: To improve pain and symptoms in the right dominant arm  NEXT MD VISIT: As needed    OBJECTIVE: (All objective assessments below are from initial evaluation on: 07/10/23 unless otherwise specified.)   HAND DOMINANCE: Right   ADLs: Overall ADLs: States decreased ability to grab, hold household objects, pain and difficulty to open containers, perform FMS tasks (manipulate fasteners on clothing), mild to moderate bathing problems as well.    FUNCTIONAL OUTCOME MEASURES: Eval: Patient Specific Functional Scale: 2.3 (sleeping, lifting household objects, leaning on elbow/wrist/fa)  (Higher Score  =  Better Ability for the Selected Tasks)      UPPER EXTREMITY ROM     Shoulder to Wrist AROM Right eval Rt 07/25/23  Shoulder flexion    Shoulder abduction    Shoulder extension    Shoulder internal rotation    Shoulder external rotation    Elbow flexion 155   Elbow extension 0   Forearm supination 75   Forearm pronation  85   Wrist flexion 70  (80 in Lt) ***  Wrist extension 72 (70 in Lt)  ***  Wrist ulnar deviation    Wrist radial deviation    Functional dart thrower's motion (F-DTM) in ulnar flexion    F-DTM in radial extension     (Blank rows = not tested)   Hand AROM Right eval  Full Fist Ability (or Gap to Distal Palmar Crease) full  Thumb Opposition  (Kapandji Scale)  WNL  (Blank rows = not tested)   UPPER EXTREMITY MMT:      MMT Right 07/10/2023 Rt 07/25/23  Shoulder flexion    Shoulder abduction    Shoulder adduction    Shoulder extension    Shoulder internal rotation    Shoulder external rotation    Middle trapezius    Lower trapezius    Elbow flexion    Elbow extension    Forearm supination 4+/5   Forearm pronation 5/5   Wrist flexion 5/5 MMT   Wrist extension 4/5 MMT tender ***/5  Wrist ulnar deviation 5/5    Wrist radial deviation    (Blank rows = not tested)  HAND FUNCTION: 07/25/23: Grip Rt: ***#    Eval: Observed weakness in affected Rt hand.  Grip strength Right: 41 tender lbs, Left: 64 lbs   COORDINATION: Eval: No coordination issues other than gross motor skills when extending elbow  SENSATION: Eval:  Light touch intact today, no complaints of paresthesia  EDEMA:   Eval: None significant today  COGNITION: Eval: Overall cognitive status: WFL for evaluation today   OBSERVATIONS:   Eval:  she states bicep is someehat sore, ulnar side of wrist is sore, lateral elbow (worst) can be sore and it jumps around  Presents as Right lateral epicondylitis   TODAY'S TREATMENT:  07/25/23: *** Review initial HEP and recommendations    Post-evaluation treatment:   For safety/self-care, she was educated to avoid painful postures and positions (extended elbow, pronation, wrist flexion or strenuous wrist extension), and try to limit painful activities for the next 4 to 6 weeks.  She was taught to use a light moist heat and self massage to help loosen tissues and help blood flow and healing.  She was shown a counterforce strap that she could purchase if this helps decrease her pain. Next, she was given the following home exercise program to perform consistently at least 4 times a day.  Each 1 was gone over with her briefly to ensure she understood and that these did not hurt her.  They will need more review in upcoming sessions.   Exercises - Seated Elbow Manual Massage Clockwise  - 3-4 x daily - 2-3 mins hold - Seated Scapular Retraction  - 3-4 x daily - 5-10 reps - Standing Radial Nerve Glide  - 3-4 x daily - 1 sets - 5-10 reps - Tricep Stretch- DO SEATED BY TABLE  - 3-4 x daily - 3-5 reps - 15 hold - Fridge Door Stretch  - 4 x daily - 3-5 reps - 15 sec hold - Forearm Pronation Stretch  - 3-4 x daily - 3-5 reps - 15 sec hold - Wrist Flexion Stretch  - 3-4 x daily - 3-5 reps - 15 sec  hold     PATIENT EDUCATION: Education details: See tx section above for details  Person educated: Patient Education method: Verbal Instruction, Teach back, Handouts  Education comprehension: States and demonstrates understanding, Additional Education required    HOME EXERCISE PROGRAM: Access Code: [email or web address] URL: [email or web address] Date: 07/10/2023 Prepared by: Melvenia Ada   GOALS: Goals reviewed with patient? Yes   SHORT TERM GOALS: (STG required if POC>30 days) Target Date: 07/26/23  Pt will obtain protective, custom orthotic. Goal status: TBD/PRN  2.  Pt will demo/state understanding of initial HEP to improve pain levels and prerequisite motion. Goal status: 07/25/23: ***   LONG TERM GOALS: Target Date: 08/23/23  Pt will improve functional ability by decreased impairment per PSFS assessment from 2.3 to 6.5 or better, for better quality of life. Goal status: INITIAL  2.  Pt will improve grip strength in Rt hand from tender 41lbs to at least 55lbs for functional use at home and in IADLs. Goal status: INITIAL  3.  Pt will improve A/ROM in right wrist flexion from 70 degrees to at least 75 degrees, to have functional motion for tasks like reach and grasp.  Goal status: INITIAL  4.  Pt will improve strength in right wrist extension from tender 4/5 MMT to at least 4+/5 MMT to have increased functional ability to carry out selfcare and higher-level homecare tasks with less difficulty. Goal status: INITIAL  5.  Pt will decrease pain at worst from 10/10 to 3/10 or better to have better sleep and occupational participation in daily roles. Goal status: INITIAL   ASSESSMENT:  CLINICAL IMPRESSION: 07/25/23: ***   Patient is a 43 y.o. female who was seen today for occupational therapy evaluation for soreness, stiffness and tenderness through the right dominant forearm and elbow and wrist areas thought to be lateral epicondylitis in general tendinitis.  The  patient will benefit from outpatient occupational therapy to decrease symptoms, improve functional upper extremity use, and increase quality of life.    PLAN:  OT  FREQUENCY: 1-2x/week  OT DURATION: 6 weeks through 08/23/23 and up to 12 total visits as needed   PLANNED INTERVENTIONS: 97535 self care/ADL training, 02889 therapeutic exercise, 97530 therapeutic activity, 97112 neuromuscular re-education, 97140 manual therapy, 97035 ultrasound, 97032 electrical stimulation (manual), 97760 Orthotic Initial, [id number] Orthotic/Prosthetic subsequent, compression bandaging, Dry needling, energy conservation, coping strategies training, and patient/family education  RECOMMENDED OTHER SERVICES: none now    CONSULTED AND AGREED WITH PLAN OF CARE: Patient  PLAN FOR NEXT SESSION:   ***   Melvenia Ada, OTR/L, CHT  07/23/2023, 11:53 AM

## 2023-07-25 ENCOUNTER — Ambulatory Visit: Admitting: Rehabilitative and Restorative Service Providers"

## 2023-07-25 ENCOUNTER — Encounter: Payer: Self-pay | Admitting: Rehabilitative and Restorative Service Providers"

## 2023-07-25 DIAGNOSIS — M25531 Pain in right wrist: Secondary | ICD-10-CM

## 2023-07-25 DIAGNOSIS — M25521 Pain in right elbow: Secondary | ICD-10-CM

## 2023-07-25 DIAGNOSIS — M6281 Muscle weakness (generalized): Secondary | ICD-10-CM | POA: Diagnosis not present

## 2023-07-25 NOTE — Therapy (Incomplete)
 OUTPATIENT OCCUPATIONAL THERAPY TREATMENT NOTE  Patient Name: Charlene [name] MRN: [id number] DOB:02-27-1980, 43 y.o., female Today's Date: 07/25/2023  PCP: Mahlon BEAL MD REFERRING PROVIDER:  Joane Artist RAMAN, MD    END OF SESSION:     Past Medical History:  Diagnosis Date   Alcoholism (HCC)    Anxiety    Anxiety and depression    Bipolar 2 disorder (HCC)    Borderline personality disorder (HCC)    Chest pain    Chronic fatigue syndrome    Claustrophobia    on meds   Constipation    Depression    on meds   Elevated blood sugar 06/08/2014   Elevated LFTs    Fatigue    Fatty liver 10/2014   GERD (gastroesophageal reflux disease)    diet related   Heart murmur    Hyperlipidemia    on meds   Infertility associated with anovulation    Joint pain    Low grade squamous intraepithelial lesion (LGSIL) on Pap smear 10/30/2011   Major depressive disorder    Migraines    MS (multiple sclerosis) (HCC)    Neuromuscular disorder (HCC)    Obesity    Obesity in pregnancy, antepartum    PCOS (polycystic ovarian syndrome)    Personal history of pre-term labor    Prediabetes    Pregnancy induced hypertension    pregnancy related- hx of   Psoriasis    Sleep apnea    SOB (shortness of breath)    Vitamin D  deficiency    Past Surgical History:  Procedure Laterality Date   COLONOSCOPY  10/17/2020   DILATION AND CURETTAGE OF UTERUS  08/15/2008   DILATION AND EVACUATION  12/29/2011   Procedure: DILATATION AND EVACUATION;  Surgeon: Winton Felt, MD;  Location: [facility];  Service: Gynecology;;  Dr, Edsel transferred case to Dr. Felt   LAPAROSCOPIC GASTRIC SLEEVE RESECTION N/A 12/07/2014   Procedure: LAPAROSCOPIC GASTRIC SLEEVE RESECTION;  Surgeon: Alm Angle, MD;  Location: [facility];  Service: General;  Laterality: N/A;   Patient Active Problem List   Diagnosis Date Noted   Lateral epicondylitis, right elbow 06/04/2023   Anxiety 05/13/2023   Prediabetes 05/13/2023   Other  constipation 12/06/2022   Encounter for annual routine gynecological examination 11/28/2022   Polyphagia 05/10/2022   High risk medication use 03/28/2022   BMI 32.0-32.9,adult 03/15/2022   Status post laparoscopic sleeve gastrectomy (2016) 02/15/2022   Iron  deficiency anemia 02/15/2022   Notalgia 01/25/2022   Stress 01/25/2022   Insulin  resistance 11/29/2021   Elevated LFTs 11/08/2021   Pre-hypertension 10/17/2021   Vitamin D  deficiency 10/17/2021   SOBOE (shortness of breath on exertion) 10/17/2021   Other hyperlipidemia 08/09/2021   Rosacea 09/15/2020   Physical exam 06/16/2020   Alcohol abuse 05/01/2018   Bipolar disorder (HCC) 06/08/2016   Borderline personality disorder (HCC) 06/08/2016   Obesity (BMI 30-39.9) 12/07/2014   Hyperlipemia 06/08/2014   Elevated blood sugar 06/08/2014   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   Migraines 06/08/2014   Multiple sclerosis (HCC)    History of abnormal cervical Pap smear 10/30/2011   Adjustment disorder with anxiety 01/05/2008   Morbid obesity (HCC) 11/26/2007    ONSET DATE: 3-4 months onset   REFERRING DIAG: M25.521 (ICD-10-CM) - Right elbow pain   THERAPY DIAG:  No diagnosis found.  Rationale for Evaluation and Treatment: Rehabilitation  PERTINENT HISTORY: Rt Lat epi, was on MD program  She states states insidious onset 3-4 months ago, thought it was psoriatic arthritis, She  was put on meds for that, but months later it didn't help, and Dr. Joane dx her with Rt lat epi.   She tried MD HEP, but did not work for her independently. At night ain is the worst.   PRECAUTIONS: Other: chronic MS (heat sensitive), psoriatic arthritis, and other conditions   RED FLAGS: None   WEIGHT BEARING RESTRICTIONS: No    SUBJECTIVE:   SUBJECTIVE STATEMENT: She states ***    she had a skin rash earlier in the day, but it has calmed down.  She has no apparent rash now and looks well.  She states that home exercises were causing some delayed onset  soreness and pain.  She does not have much soreness or pain coming in today.  She states 10 out of 10 pain is still happening but less frequently now.       PAIN:  Are you having pain? Yes: NPRS scale:  *** 1/10 at rest but up to 3-4/10 at worst in past week Pain location: Rt lateral/dorsal forearm and wrist areas  Pain description: aching and sometimes sharp Aggravating factors: reaching out elbow and grabbing objects Relieving factors: rest   PATIENT GOALS: To improve pain and symptoms in the right dominant arm  NEXT MD VISIT: As needed    OBJECTIVE: (All objective assessments below are from initial evaluation on: 07/10/23 unless otherwise specified.)   HAND DOMINANCE: Right   ADLs: Overall ADLs: States decreased ability to grab, hold household objects, pain and difficulty to open containers, perform FMS tasks (manipulate fasteners on clothing), mild to moderate bathing problems as well.    FUNCTIONAL OUTCOME MEASURES: Eval: Patient Specific Functional Scale: 2.3 (sleeping, lifting household objects, leaning on elbow/wrist/fa)  (Higher Score  =  Better Ability for the Selected Tasks)      UPPER EXTREMITY ROM     Shoulder to Wrist AROM Right eval Rt 07/29/23  Shoulder flexion    Shoulder abduction    Shoulder extension    Shoulder internal rotation    Shoulder external rotation    Elbow flexion 155   Elbow extension 0   Forearm supination 75   Forearm pronation  85   Wrist flexion 70  (80 in Lt) ***  Wrist extension 72 (70 in Lt)  ***  Wrist ulnar deviation    Wrist radial deviation    Functional dart thrower's motion (F-DTM) in ulnar flexion    F-DTM in radial extension     (Blank rows = not tested)   Hand AROM Right eval  Full Fist Ability (or Gap to Distal Palmar Crease) full  Thumb Opposition  (Kapandji Scale)  WNL  (Blank rows = not tested)   UPPER EXTREMITY MMT:      MMT Right 07/10/2023 Rt 07/29/23  Shoulder flexion    Shoulder abduction     Shoulder adduction    Shoulder extension    Shoulder internal rotation    Shoulder external rotation    Middle trapezius    Lower trapezius    Elbow flexion    Elbow extension    Forearm supination 4+/5   Forearm pronation 5/5   Wrist flexion 5/5 MMT   Wrist extension 4/5 MMT tender   Wrist ulnar deviation 5/5   Wrist radial deviation    (Blank rows = not tested)  HAND FUNCTION: 07/29/23: Grip Rt: ***#    Eval: Observed weakness in affected Rt hand.  Grip strength Right: 41 tender lbs, Left: 64 lbs   COORDINATION: Eval: No  coordination issues other than gross motor skills when extending elbow  SENSATION: Eval:  Light touch intact today, no complaints of paresthesia  OBSERVATIONS:   Eval:  she states bicep is someehat sore, ulnar side of wrist is sore, lateral elbow (worst) can be sore and it jumps around   Presents as Right lateral epicondylitis   TODAY'S TREATMENT:  07/29/23: *** Consider Kinesiotape, consider dry needling, otherwise continue on with IASTM, heat, try percussion, stretches.  Once pain is low for consistent 2 weeks, consider light eccentric training.   07/25/23: While she is on moist heat for 3 minutes, OT reviews her home exercise program with her and goes over the main stretches of tricep stretch, biceps stretch, wrist flexion stretch with pronation.  We also reviewed radial nerve gliding for an active stretch.  OT does manual therapy IASTM to the dorsum of the forearm and wrist to the lateral epicondyle, which she states is mildly sore but also feels somewhat good.  She was educated on how to use a tennis ball to perform a trigger point release which she should hold for 15 to 30 seconds, do 2-3 times, and do only once or twice in a day.  This was targeted toward muscle spasms felt in the lateral bicep and lateral aspect of the triceps.  She had some pain with this so was encouraged to do only once or twice a day.  We reviewed sleeping postures and she was  told to be cautious with having a very bad or very extended elbow in the night, do stretch routine and the warm up first thing in the morning to prevent pain throughout the day.  She is having a bit of soreness at the end of the session, so she was recommended to use ice for up to 5 to 10 minutes afterwards as needed.  Altogether she has very little soreness or pain throughout today's session, and she was encouraged to be consistent with exercise program and recommendations, as she states only doing them perhaps once or twice a day and also missing some days.      Exercises reviewed today: - Seated Elbow Manual Massage Clockwise  - 3-4 x daily - 2-3 mins hold - Seated Scapular Retraction  - 3-4 x daily - 5-10 reps - Standing Radial Nerve Glide  - 3-4 x daily - 1 sets - 5-10 reps - Tricep Stretch- DO SEATED BY TABLE  - 3-4 x daily - 3-5 reps - 15 hold - Fridge Door Stretch  - 4 x daily - 3-5 reps - 15 sec hold - Wrist Flexion Stretch  - 3-4 x daily - 3-5 reps - 15 sec hold     PATIENT EDUCATION: Education details: See tx section above for details  Person educated: Patient Education method: Verbal Instruction, Teach back, Handouts  Education comprehension: States and demonstrates understanding, Additional Education required    HOME EXERCISE PROGRAM: Access Code: [email or web address] URL: [email or web address] Date: 07/10/2023 Prepared by: Melvenia Ada   GOALS: Goals reviewed with patient? Yes   SHORT TERM GOALS: (STG required if POC>30 days) Target Date: 07/26/23  Pt will obtain protective, custom orthotic. Goal status: TBD/PRN  2.  Pt will demo/state understanding of initial HEP to improve pain levels and prerequisite motion. Goal status: 07/25/23: Goal met   LONG TERM GOALS: Target Date: 08/23/23  Pt will improve functional ability by decreased impairment per PSFS assessment from 2.3 to 6.5 or better, for better quality of life. Goal status: INITIAL  2.  Pt  will  improve grip strength in Rt hand from tender 41lbs to at least 55lbs for functional use at home and in IADLs. Goal status: INITIAL  3.  Pt will improve A/ROM in right wrist flexion from 70 degrees to at least 75 degrees, to have functional motion for tasks like reach and grasp.  Goal status: INITIAL  4.  Pt will improve strength in right wrist extension from tender 4/5 MMT to at least 4+/5 MMT to have increased functional ability to carry out selfcare and higher-level homecare tasks with less difficulty. Goal status: INITIAL  5.  Pt will decrease pain at worst from 10/10 to 3/10 or better to have better sleep and occupational participation in daily roles. Goal status: INITIAL   ASSESSMENT:  CLINICAL IMPRESSION: 07/29/23: ***  07/25/23: She has not been consistent with exercises and she has not come into therapy for 2 weeks, has been having some lingering soreness and pain but actually improving on her 10 out of 10 pain at worst.  OT is sure that if she gets consistent with nonpainful stretches symptoms should help resolve.  She does have a large muscle spasms noted in the distal lateral triceps and biceps which would benefit from dry needling if she is willing.  We can also try some Kinesiotape in upcoming sessions.   Patient is a 43 y.o. female who was seen today for occupational therapy evaluation for soreness, stiffness and tenderness through the right dominant forearm and elbow and wrist areas thought to be lateral epicondylitis in general tendinitis.  The patient will benefit from outpatient occupational therapy to decrease symptoms, improve functional upper extremity use, and increase quality of life.    PLAN:  OT FREQUENCY: 1-2x/week  OT DURATION: 6 weeks through 08/23/23 and up to 12 total visits as needed   PLANNED INTERVENTIONS: 97535 self care/ADL training, 02889 therapeutic exercise, 97530 therapeutic activity, 97112 neuromuscular re-education, 97140 manual therapy, 97035  ultrasound, 97032 electrical stimulation (manual), 97760 Orthotic Initial, [id number] Orthotic/Prosthetic subsequent, compression bandaging, Dry needling, energy conservation, coping strategies training, and patient/family education  RECOMMENDED OTHER SERVICES: none now    CONSULTED AND AGREED WITH PLAN OF CARE: Patient  PLAN FOR NEXT SESSION:   ***  Melvenia Ada, OTR/L, CHT  07/25/2023, 4:59 PM

## 2023-07-29 ENCOUNTER — Encounter: Admitting: Rehabilitative and Restorative Service Providers"

## 2023-07-31 ENCOUNTER — Ambulatory Visit (INDEPENDENT_AMBULATORY_CARE_PROVIDER_SITE_OTHER)

## 2023-07-31 VITALS — BP 126/80 | HR 94 | Temp 98.1°F | Resp 18 | Ht 65.0 in | Wt 196.4 lb

## 2023-07-31 DIAGNOSIS — G35 Multiple sclerosis: Secondary | ICD-10-CM

## 2023-07-31 MED ORDER — SODIUM CHLORIDE 0.9 % IV SOLN
600.0000 mg | Freq: Once | INTRAVENOUS | Status: AC
  Administered 2023-07-31: 600 mg via INTRAVENOUS
  Filled 2023-07-31: qty 20

## 2023-07-31 MED ORDER — DIPHENHYDRAMINE HCL 25 MG PO CAPS
50.0000 mg | ORAL_CAPSULE | Freq: Once | ORAL | Status: AC
  Administered 2023-07-31: 50 mg via ORAL
  Filled 2023-07-31: qty 2

## 2023-07-31 MED ORDER — METHYLPREDNISOLONE SODIUM SUCC 125 MG IJ SOLR
125.0000 mg | Freq: Once | INTRAMUSCULAR | Status: AC
Start: 2023-07-31 — End: 2023-07-31
  Administered 2023-07-31: 125 mg via INTRAVENOUS
  Filled 2023-07-31: qty 2

## 2023-07-31 MED ORDER — ACETAMINOPHEN 325 MG PO TABS
650.0000 mg | ORAL_TABLET | Freq: Once | ORAL | Status: AC
  Administered 2023-07-31: 650 mg via ORAL
  Filled 2023-07-31: qty 2

## 2023-07-31 NOTE — Progress Notes (Signed)
 Diagnosis: Multiple Sclerosis  Provider:  Lonna Coder MD  Procedure: IV Infusion  IV Type: Peripheral, IV Location: R Forearm  Ocrevus  (Ocrelizumab ), Dose: 600 mg  Infusion Start Time: 0920  Infusion Stop Time: 1142  Post Infusion IV Care: Pt declined observation and PIV out.  Discharge: Condition: Good, Destination: Home . AVS Declined  Performed by:  Pharaoh Pio, RN

## 2023-08-01 ENCOUNTER — Encounter (INDEPENDENT_AMBULATORY_CARE_PROVIDER_SITE_OTHER): Payer: Self-pay | Admitting: Family Medicine

## 2023-08-01 ENCOUNTER — Ambulatory Visit (INDEPENDENT_AMBULATORY_CARE_PROVIDER_SITE_OTHER): Admitting: Family Medicine

## 2023-08-01 VITALS — BP 122/80 | HR 100 | Temp 98.1°F | Ht 65.5 in | Wt 191.0 lb

## 2023-08-01 DIAGNOSIS — Z6841 Body Mass Index (BMI) 40.0 and over, adult: Secondary | ICD-10-CM

## 2023-08-01 DIAGNOSIS — E249 Cushing's syndrome, unspecified: Secondary | ICD-10-CM | POA: Diagnosis not present

## 2023-08-01 DIAGNOSIS — R632 Polyphagia: Secondary | ICD-10-CM | POA: Diagnosis not present

## 2023-08-01 DIAGNOSIS — E559 Vitamin D deficiency, unspecified: Secondary | ICD-10-CM | POA: Diagnosis not present

## 2023-08-01 DIAGNOSIS — E669 Obesity, unspecified: Secondary | ICD-10-CM | POA: Diagnosis not present

## 2023-08-01 DIAGNOSIS — Z6831 Body mass index (BMI) 31.0-31.9, adult: Secondary | ICD-10-CM

## 2023-08-01 DIAGNOSIS — Z6832 Body mass index (BMI) 32.0-32.9, adult: Secondary | ICD-10-CM

## 2023-08-01 DIAGNOSIS — E65 Localized adiposity: Secondary | ICD-10-CM

## 2023-08-01 MED ORDER — TIRZEPATIDE-WEIGHT MANAGEMENT 12.5 MG/0.5ML ~~LOC~~ SOAJ
12.5000 mg | SUBCUTANEOUS | 1 refills | Status: DC
Start: 2023-08-01 — End: 2023-09-04

## 2023-08-01 NOTE — Therapy (Incomplete)
 OUTPATIENT OCCUPATIONAL THERAPY TREATMENT NOTE  Patient Name: Charlene [name] MRN: [id number] DOB:[birth year], 43 y.o., female Today's Date: 08/01/2023  PCP: Mahlon BEAL MD REFERRING PROVIDER:  Joane Artist RAMAN, MD    END OF SESSION:     Past Medical History:  Diagnosis Date   Alcoholism (HCC)    Anxiety    Anxiety and depression    Bipolar 2 disorder (HCC)    Borderline personality disorder (HCC)    Chest pain    Chronic fatigue syndrome    Claustrophobia    on meds   Constipation    Depression    on meds   Elevated blood sugar 06/08/2014   Elevated LFTs    Fatigue    Fatty liver 10/2014   GERD (gastroesophageal reflux disease)    diet related   Heart murmur    Hyperlipidemia    on meds   Infertility associated with anovulation    Joint pain    Low grade squamous intraepithelial lesion (LGSIL) on Pap smear 10/30/2011   Major depressive disorder    Migraines    MS (multiple sclerosis) (HCC)    Neuromuscular disorder (HCC)    Obesity    Obesity in pregnancy, antepartum    PCOS (polycystic ovarian syndrome)    Personal history of pre-term labor    Prediabetes    Pregnancy induced hypertension    pregnancy related- hx of   Psoriasis    Sleep apnea    SOB (shortness of breath)    Vitamin D  deficiency    Past Surgical History:  Procedure Laterality Date   COLONOSCOPY  10/17/2020   DILATION AND CURETTAGE OF UTERUS  08/15/2008   DILATION AND EVACUATION  12/29/2011   Procedure: DILATATION AND EVACUATION;  Surgeon: Winton Felt, MD;  Location: [facility];  Service: Gynecology;;  Dr, Edsel transferred case to Dr. Felt   LAPAROSCOPIC GASTRIC SLEEVE RESECTION N/A 12/07/2014   Procedure: LAPAROSCOPIC GASTRIC SLEEVE RESECTION;  Surgeon: Alm Angle, MD;  Location: [facility];  Service: General;  Laterality: N/A;   Patient Active Problem List   Diagnosis Date Noted   Lateral epicondylitis, right elbow 06/04/2023   Anxiety 05/13/2023   Prediabetes 05/13/2023   Other  constipation 12/06/2022   Encounter for annual routine gynecological examination 11/28/2022   Polyphagia 05/10/2022   High risk medication use 03/28/2022   BMI 32.0-32.9,adult 03/15/2022   Status post laparoscopic sleeve gastrectomy (2016) 02/15/2022   Iron  deficiency anemia 02/15/2022   Notalgia 01/25/2022   Stress 01/25/2022   Insulin  resistance 11/29/2021   Elevated LFTs 11/08/2021   Pre-hypertension 10/17/2021   Vitamin D  deficiency 10/17/2021   SOBOE (shortness of breath on exertion) 10/17/2021   Other hyperlipidemia 08/09/2021   Rosacea 09/15/2020   Physical exam 06/16/2020   Alcohol abuse 05/01/2018   Bipolar disorder (HCC) 06/08/2016   Borderline personality disorder (HCC) 06/08/2016   Obesity (BMI 30-39.9) 12/07/2014   Hyperlipemia 06/08/2014   Elevated blood sugar 06/08/2014   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   Migraines 06/08/2014   Multiple sclerosis (HCC)    History of abnormal cervical Pap smear 10/30/2011   Adjustment disorder with anxiety 01/05/2008   Morbid obesity (HCC) 11/26/2007    ONSET DATE: 3-4 months onset   REFERRING DIAG: M25.521 (ICD-10-CM) - Right elbow pain   THERAPY DIAG:  No diagnosis found.  Rationale for Evaluation and Treatment: Rehabilitation  PERTINENT HISTORY: Rt Lat epi, was on MD program  She states states insidious onset 3-4 months ago, thought it was psoriatic arthritis, She  was put on meds for that, but months later it didn't help, and Dr. Joane dx her with Rt lat epi.   She tried MD HEP, but did not work for her independently. At night ain is the worst.   PRECAUTIONS: Other: chronic MS (heat sensitive), psoriatic arthritis, and other conditions   RED FLAGS: None   WEIGHT BEARING RESTRICTIONS: No    SUBJECTIVE:   SUBJECTIVE STATEMENT: She states ***    she had a skin rash earlier in the day, but it has calmed down.  She has no apparent rash now and looks well.  She states that home exercises were causing some delayed onset  soreness and pain.  She does not have much soreness or pain coming in today.  She states 10 out of 10 pain is still happening but less frequently now.       PAIN:  Are you having pain? Yes: NPRS scale:  *** 1/10 at rest but up to 3-4/10 at worst in past week Pain location: Rt lateral/dorsal forearm and wrist areas  Pain description: aching and sometimes sharp Aggravating factors: reaching out elbow and grabbing objects Relieving factors: rest   PATIENT GOALS: To improve pain and symptoms in the right dominant arm  NEXT MD VISIT: As needed    OBJECTIVE: (All objective assessments below are from initial evaluation on: 07/10/23 unless otherwise specified.)   HAND DOMINANCE: Right   ADLs: Overall ADLs: States decreased ability to grab, hold household objects, pain and difficulty to open containers, perform FMS tasks (manipulate fasteners on clothing), mild to moderate bathing problems as well.    FUNCTIONAL OUTCOME MEASURES: Eval: Patient Specific Functional Scale: 2.3 (sleeping, lifting household objects, leaning on elbow/wrist/fa)  (Higher Score  =  Better Ability for the Selected Tasks)      UPPER EXTREMITY ROM     Shoulder to Wrist AROM Right eval Rt 08/02/23  Shoulder flexion    Shoulder abduction    Shoulder extension    Shoulder internal rotation    Shoulder external rotation    Elbow flexion 155   Elbow extension 0   Forearm supination 75   Forearm pronation  85   Wrist flexion 70  (80 in Lt) ***  Wrist extension 72 (70 in Lt)  ***  Wrist ulnar deviation    Wrist radial deviation    Functional dart thrower's motion (F-DTM) in ulnar flexion    F-DTM in radial extension     (Blank rows = not tested)   Hand AROM Right eval  Full Fist Ability (or Gap to Distal Palmar Crease) full  Thumb Opposition  (Kapandji Scale)  WNL  (Blank rows = not tested)   UPPER EXTREMITY MMT:      MMT Right 07/10/2023 Rt 08/02/23  Shoulder flexion    Shoulder abduction     Shoulder adduction    Shoulder extension    Shoulder internal rotation    Shoulder external rotation    Middle trapezius    Lower trapezius    Elbow flexion    Elbow extension    Forearm supination 4+/5   Forearm pronation 5/5   Wrist flexion 5/5 MMT   Wrist extension 4/5 MMT tender   Wrist ulnar deviation 5/5   Wrist radial deviation    (Blank rows = not tested)  HAND FUNCTION: 08/02/23: Grip Rt: ***#    Eval: Observed weakness in affected Rt hand.  Grip strength Right: 41 tender lbs, Left: 64 lbs   COORDINATION: Eval: No  coordination issues other than gross motor skills when extending elbow  SENSATION: Eval:  Light touch intact today, no complaints of paresthesia  OBSERVATIONS:   Eval:  she states bicep is someehat sore, ulnar side of wrist is sore, lateral elbow (worst) can be sore and it jumps around   Presents as Right lateral epicondylitis   TODAY'S TREATMENT:  08/02/23: *** Consider Kinesiotape, consider dry needling, otherwise continue on with IASTM, heat, try percussion, stretches.  Once pain is low for consistent 2 weeks, consider light eccentric training.     Exercises reviewed today: - Seated Elbow Manual Massage Clockwise  - 3-4 x daily - 2-3 mins hold - Seated Scapular Retraction  - 3-4 x daily - 5-10 reps - Standing Radial Nerve Glide  - 3-4 x daily - 1 sets - 5-10 reps - Tricep Stretch- DO SEATED BY TABLE  - 3-4 x daily - 3-5 reps - 15 hold - Fridge Door Stretch  - 4 x daily - 3-5 reps - 15 sec hold - Wrist Flexion Stretch  - 3-4 x daily - 3-5 reps - 15 sec hold     PATIENT EDUCATION: Education details: See tx section above for details  Person educated: Patient Education method: Verbal Instruction, Teach back, Handouts  Education comprehension: States and demonstrates understanding, Additional Education required    HOME EXERCISE PROGRAM: Access Code: [email or web address] URL: [email or web address] Date: 07/10/2023 Prepared by:  Melvenia Ada   GOALS: Goals reviewed with patient? Yes   SHORT TERM GOALS: (STG required if POC>30 days) Target Date: 07/26/23  Pt will obtain protective, custom orthotic. Goal status: TBD/PRN  2.  Pt will demo/state understanding of initial HEP to improve pain levels and prerequisite motion. Goal status: 07/25/23: Goal met   LONG TERM GOALS: Target Date: 08/23/23  Pt will improve functional ability by decreased impairment per PSFS assessment from 2.3 to 6.5 or better, for better quality of life. Goal status: INITIAL  2.  Pt will improve grip strength in Rt hand from tender 41lbs to at least 55lbs for functional use at home and in IADLs. Goal status: INITIAL  3.  Pt will improve A/ROM in right wrist flexion from 70 degrees to at least 75 degrees, to have functional motion for tasks like reach and grasp.  Goal status: INITIAL  4.  Pt will improve strength in right wrist extension from tender 4/5 MMT to at least 4+/5 MMT to have increased functional ability to carry out selfcare and higher-level homecare tasks with less difficulty. Goal status: INITIAL  5.  Pt will decrease pain at worst from 10/10 to 3/10 or better to have better sleep and occupational participation in daily roles. Goal status: INITIAL   ASSESSMENT:  CLINICAL IMPRESSION: 08/02/23: ***  07/25/23: She has not been consistent with exercises and she has not come into therapy for 2 weeks, has been having some lingering soreness and pain but actually improving on her 10 out of 10 pain at worst.  OT is sure that if she gets consistent with nonpainful stretches symptoms should help resolve.  She does have a large muscle spasms noted in the distal lateral triceps and biceps which would benefit from dry needling if she is willing.  We can also try some Kinesiotape in upcoming sessions.   Patient is a 43 y.o. female who was seen today for occupational therapy evaluation for soreness, stiffness and tenderness through the  right dominant forearm and elbow and wrist areas thought to be lateral epicondylitis in general tendinitis.  The patient will benefit from outpatient occupational therapy to decrease symptoms, improve functional upper extremity use, and increase quality of life.    PLAN:  OT FREQUENCY: 1-2x/week  OT DURATION: 6 weeks through 08/23/23 and up to 12 total visits as needed   PLANNED INTERVENTIONS: 97535 self care/ADL training, 02889 therapeutic exercise, 97530 therapeutic activity, 97112 neuromuscular re-education, 97140 manual therapy, 97035 ultrasound, 97032 electrical stimulation (manual), 97760 Orthotic Initial, [id number] Orthotic/Prosthetic subsequent, compression bandaging, Dry needling, energy conservation, coping strategies training, and patient/family education  RECOMMENDED OTHER SERVICES: none now    CONSULTED AND AGREED WITH PLAN OF CARE: Patient  PLAN FOR NEXT SESSION:   ***  Melvenia Ada, OTR/L, CHT  08/01/2023, 8:15 AM

## 2023-08-01 NOTE — Progress Notes (Signed)
 Office: [phone]  /  Fax: [phone]  WEIGHT SUMMARY AND BIOMETRICS  Anthropometric Measurements Height: 5' 5.5 (1.664 m) Weight: 191 lb (86.6 kg) BMI (Calculated): 31.29 Weight at Last Visit: 197 lb Weight Lost Since Last Visit: 6 lb Weight Gained Since Last Visit: 0 lb Starting Weight: 250 lb Total Weight Loss (lbs): 59 lb (26.8 kg) Peak Weight: 260 lb   Body Composition  Body Fat %: 39.9 % Fat Mass (lbs): 76.4 lbs Muscle Mass (lbs): 109.2 lbs Total Body Water  (lbs): 84.8 lbs Visceral Fat Rating : 8   Other Clinical Data Fasting: no Labs: no Today's Visit #: 28 Starting Date: 03/29/21    Chief Complaint: OBESITY   Discussed the use of AI scribe software for clinical note transcription with the patient, who gave verbal consent to proceed.  History of Present Illness Charlene [name] is a 43 year old female who presents for a follow-up on her weight management plan.  She is adhering to a category two eating plan approximately 75% of the time and has achieved a weight loss of six pounds over the past month. She attributes this success to consuming smaller, more frequent meals, which helps her avoid feeling overly full and make healthier food choices. 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 She is currently undergoing occupational therapy for her arm.  She experiences constipation, which she manages with Metamucil, although she occasionally forgets to take it. This regimen has improved her symptoms, but they are not completely resolved. She is also working on improving her hydration, alternating between water  and Diet Coke, especially in the mornings when she feels sluggish due to sleep issues.  Her medication regimen includes Zepbound  and vitamin D , with the latter adjusted to every other week due to her weight loss affecting vitamin D  levels. Her B12  levels remain lower than desired despite a B12-rich diet. She was previously on metformin , which was discontinued after her last blood work.  She is concerned about potential Cushing's syndrome due to symptoms such as a 'buffalo hump,' purple striae, and a history of 'moon face.' She has a history of high stress and past alcohol overuse, which she believes may have contributed to her symptoms. She has not had her cortisol levels tested previously.  She reports changes in her menstrual cycle, with recent periods being shorter and accompanied by cramps, which is atypical for her. She also experienced a rash recently and is curious about whether these changes could be related to perimenopause.      PHYSICAL EXAM:  Blood pressure 122/80, pulse 100, temperature 98.1 F (36.7 C), height 5' 5.5 (1.664 m), weight 191 lb (86.6 kg), last menstrual period 06/24/2023, SpO2 96%. Body mass index is 31.3 kg/m.  DIAGNOSTIC DATA REVIEWED:  BMET    Component Value Date/Time   NA 142 07/01/2023 0904   K 3.8 07/01/2023 0904   CL 104 07/01/2023 0904   CO2 21 07/01/2023 0904   GLUCOSE 83 07/01/2023 0904   GLUCOSE 92 08/14/2022 1304   GLUCOSE 93 11/24/2013 1126   BUN 11 07/01/2023 0904   CREATININE 0.65 07/01/2023 0904   CREATININE 0.71 08/14/2022 1304   CREATININE 0.43 (L) 12/31/2013 1542   CALCIUM  9.5 07/01/2023 0904   GFRNONAA >60 08/14/2022 1304   GFRAA >60 12/02/2014 1120   Lab Results  Component Value Date   HGBA1C 4.9 07/01/2023   HGBA1C 4.9 05/05/2018  Lab Results  Component Value Date   INSULIN  7.2 07/01/2023   INSULIN  15.6 03/29/2021   Lab Results  Component Value Date   TSH 0.463 07/01/2023   CBC    Component Value Date/Time   WBC 5.4 07/01/2023 0904   WBC 6.5 05/22/2023 1047   WBC 6.1 04/25/2022 1206   RBC 4.54 07/01/2023 0904   RBC 4.65 05/22/2023 1047   HGB 14.5 07/01/2023 0904   HCT 43.6 07/01/2023 0904   PLT 357 07/01/2023 0904   MCV 96 07/01/2023 0904   MCH  31.9 07/01/2023 0904   MCH 32.3 05/22/2023 1047   MCHC 33.3 07/01/2023 0904   MCHC 36.1 (H) 05/22/2023 1047   RDW 12.7 07/01/2023 0904   Iron  Studies    Component Value Date/Time   IRON  57 07/01/2023 0904   TIBC 271 07/01/2023 0904   FERRITIN 36 07/01/2023 0904   IRONPCTSAT 21 07/01/2023 0904   IRONPCTSAT 12 (L) 04/25/2022 1206   Lipid Panel     Component Value Date/Time   CHOL 169 07/01/2023 0904   TRIG 89 07/01/2023 0904   HDL 49 07/01/2023 0904   CHOLHDL 3 06/17/2020 1139   VLDL 27.6 06/17/2020 1139   LDLCALC 103 (H) 07/01/2023 0904   LDLDIRECT 236.0 12/22/2019 1131   Hepatic Function Panel     Component Value Date/Time   PROT 6.8 07/01/2023 0904   ALBUMIN 4.6 07/01/2023 0904   AST 18 07/01/2023 0904   AST 17 08/14/2022 1304   ALT 14 07/01/2023 0904   ALT 15 08/14/2022 1304   ALKPHOS 65 07/01/2023 0904   BILITOT 0.3 07/01/2023 0904   BILITOT 0.3 08/14/2022 1304   BILIDIR 0.0 04/25/2022 1206      Component Value Date/Time   TSH 0.463 07/01/2023 0904   Nutritional Lab Results  Component Value Date   VD25OH 69.3 07/01/2023   VD25OH 52.1 09/05/2022   VD25OH 68.7 03/28/2022     Assessment and Plan Assessment & Plan Obesity and Polyphagia She adheres to a category two eating plan 75% of the time, resulting in a six-pound weight loss over the past month. 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 Zepbound  is effective in reducing excessive hunger but complicates meal planning and protein intake. Constipation is managed with Metamucil and hydration. Smaller, frequent meals are used to manage hunger and meal satisfaction. - Continue category two eating plan - Continue Zepbound  - Continue Metamucil for constipation - Encourage hydration - Encourage lower body exercises, such as using a stationary bike with increased resistance - Consult occupational therapy for upper body exercise recommendations   Cushing's Syndrome (Rule  Out) She is concerned about Cushing's syndrome due to symptoms such as a buffalo hump, purple striae, and moon facies. Despite doubts, testing is agreed upon. Elevated cortisol levels may be influenced by stress and past corticosteroid use. A 24-hour urine free cortisol test will determine the need for further testing. - Order 24-hour urine free cortisol test - Coordinate with lab for urine collection supplies - Send MyChart message with instructions for urine collection  Vitamin D  Over-replacement Her vitamin D  level is 69, which is adequate, but ongoing weight loss poses a risk of over-replacement. To prevent levels from exceeding 100, vitamin D  is advised every other week. - Continue vitamin D  every other week   Follow-up She has upcoming appointments scheduled and is advised to continue monitoring her progress. - Schedule follow-up appointment in October - Verify upcoming appointments on August  20th and September 17th     I have personally spent 40 minutes total time today in preparation, patient care, and documentation for this visit, including the following: review of clinical lab tests; review of medical history, review of cushing's symptoms and causes and how to proceed with testing discussed. Nutritional counseling also given   She was informed of the importance of frequent follow up visits to maximize her success with intensive lifestyle modifications for her multiple health conditions.    Louann Penton, MD

## 2023-08-02 ENCOUNTER — Encounter: Admitting: Rehabilitative and Restorative Service Providers"

## 2023-08-05 NOTE — Therapy (Signed)
 OUTPATIENT OCCUPATIONAL THERAPY TREATMENT NOTE  Patient Name: Charlene [name] MRN: [id number] DOB:1980-05-20, 43 y.o., female Today's Date: 08/06/2023  PCP: Mahlon BEAL MD REFERRING PROVIDER:  Joane Artist RAMAN, MD    END OF SESSION:  OT End of Session - 08/06/23 1303     Visit Number 3    Number of Visits 12    Date for OT Re-Evaluation 08/23/23    Authorization Type [employer]    OT Start Time 1304    OT Stop Time 1349    OT Time Calculation (min) 45 min    Activity Tolerance Patient tolerated treatment well;No increased pain;Patient limited by fatigue;Patient limited by pain    Behavior During Therapy [facility] for tasks assessed/performed            Past Medical History:  Diagnosis Date   Alcoholism (HCC)    Anxiety    Anxiety and depression    Bipolar 2 disorder (HCC)    Borderline personality disorder (HCC)    Chest pain    Chronic fatigue syndrome    Claustrophobia    on meds   Constipation    Depression    on meds   Elevated blood sugar 06/08/2014   Elevated LFTs    Fatigue    Fatty liver 10/2014   GERD (gastroesophageal reflux disease)    diet related   Heart murmur    Hyperlipidemia    on meds   Infertility associated with anovulation    Joint pain    Low grade squamous intraepithelial lesion (LGSIL) on Pap smear 10/30/2011   Major depressive disorder    Migraines    MS (multiple sclerosis) (HCC)    Neuromuscular disorder (HCC)    Obesity    Obesity in pregnancy, antepartum    PCOS (polycystic ovarian syndrome)    Personal history of pre-term labor    Prediabetes    Pregnancy induced hypertension    pregnancy related- hx of   Psoriasis    Sleep apnea    SOB (shortness of breath)    Vitamin D  deficiency    Past Surgical History:  Procedure Laterality Date   COLONOSCOPY  10/17/2020   DILATION AND CURETTAGE OF UTERUS  08/15/2008   DILATION AND EVACUATION  12/29/2011   Procedure: DILATATION AND EVACUATION;  Surgeon: Winton Felt, MD;  Location: [facility];   Service: Gynecology;;  Dr, Edsel transferred case to Dr. 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   Iron  deficiency anemia 02/15/2022   Notalgia 01/25/2022   Stress 01/25/2022   Insulin  resistance 11/29/2021   Elevated LFTs 11/08/2021   Pre-hypertension 10/17/2021   Vitamin D  deficiency 10/17/2021   SOBOE (shortness of breath on exertion) 10/17/2021   Other hyperlipidemia 08/09/2021   Rosacea 09/15/2020   Physical exam 06/16/2020   Alcohol abuse 05/01/2018   Bipolar disorder (HCC) 06/08/2016   Borderline personality disorder (HCC) 06/08/2016   Obesity (BMI 30-39.9) 12/07/2014   Hyperlipemia 06/08/2014   Elevated blood sugar 06/08/2014  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   Migraines 06/08/2014   Multiple sclerosis (HCC)    History of abnormal cervical Pap smear 10/30/2011   Adjustment disorder with anxiety 01/05/2008   Obesity, unspecified 11/26/2007    ONSET DATE: 3-4 months onset   REFERRING DIAG: M25.521 (ICD-10-CM) - Right elbow pain   THERAPY DIAG:  Muscle weakness (generalized)  Pain in right elbow  Pain in right wrist  Rationale for Evaluation and Treatment: Rehabilitation  PERTINENT HISTORY: Rt Lat epi, was on MD program  She states states insidious onset 3-4 months ago, thought it was psoriatic arthritis, She was put on meds for that, but months later it didn't help, and Dr. Joane dx her with Rt lat  epi.   She tried MD HEP, but did not work for her independently. At night ain is the worst.   PRECAUTIONS: Other: chronic MS (heat sensitive), psoriatic arthritis, and other conditions   RED FLAGS: None   WEIGHT BEARING RESTRICTIONS: No    SUBJECTIVE:   SUBJECTIVE STATEMENT: She missed the last session, today states she hasn't been sleeping, has had high pain and numbness in the night.  She states she didn't come in last week due to high pain and busy schedule. She states pain is controlled due to high meds right now.  She gets tearful at times during the session today.    PAIN:  Are you having pain? Yes: NPRS scale:  3/10 at rest but up to 8-9/10 at worst in past week Pain location: Rt lateral/dorsal forearm and wrist areas  Pain description: aching and sometimes sharp Aggravating factors: reaching out elbow and grabbing objects Relieving factors: rest   PATIENT GOALS: To improve pain and symptoms in the right dominant arm  NEXT MD VISIT: As needed    OBJECTIVE: (All objective assessments below are from initial evaluation on: 07/10/23 unless otherwise specified.)   HAND DOMINANCE: Right   ADLs: Overall ADLs: States decreased ability to grab, hold household objects, pain and difficulty to open containers, perform FMS tasks (manipulate fasteners on clothing), mild to moderate bathing problems as well.    FUNCTIONAL OUTCOME MEASURES: Eval: Patient Specific Functional Scale: 2.3 (sleeping, lifting household objects, leaning on elbow/wrist/fa)  (Higher Score  =  Better Ability for the Selected Tasks)      UPPER EXTREMITY ROM     Shoulder to Wrist AROM Right eval  Shoulder flexion   Shoulder abduction   Shoulder extension   Shoulder internal rotation   Shoulder external rotation   Elbow flexion 155  Elbow extension 0  Forearm supination 75  Forearm pronation  85  Wrist flexion 70  (80 in Lt)  Wrist extension 72 (70 in Lt)   Wrist ulnar deviation   Wrist radial  deviation   Functional dart thrower's motion (F-DTM) in ulnar flexion   F-DTM in radial extension    (Blank rows = not tested)   Hand AROM Right eval  Full Fist Ability (or Gap to Distal Palmar Crease) full  Thumb Opposition  (Kapandji Scale)  WNL  (Blank rows = not tested)   UPPER EXTREMITY MMT:      MMT Right 07/10/2023 Rt 08/06/23  Shoulder flexion    Shoulder abduction    Shoulder adduction    Shoulder extension    Shoulder internal rotation    Shoulder external rotation    Middle trapezius    Lower trapezius    Elbow flexion    Elbow extension    Forearm supination 4+/5  Forearm pronation 5/5   Wrist flexion 5/5 MMT   Wrist extension 4/5 MMT tender   Wrist ulnar deviation 5/5   Wrist radial deviation    (Blank rows = not tested)  HAND FUNCTION:   Eval: Observed weakness in affected Rt hand.  Grip strength Right: 41 tender lbs, Left: 64 lbs   COORDINATION: Eval: No coordination issues other than gross motor skills when extending elbow  SENSATION: Eval:  Light touch intact today, no complaints of paresthesia  OBSERVATIONS:   Eval:  she states bicep is someehat sore, ulnar side of wrist is sore, lateral elbow (worst) can be sore and it jumps around   Presents as Right lateral epicondylitis   TODAY'S TREATMENT:  08/06/23: Today she states that her neck, her hands and wrists, and her right lateral elbow pain are the worst issues, so they are focused on.  OT starts with moist heat around her neck and on her hands as was recommended in the past for pain and tightness.  She states this feels good.  While she is on moist heat, OT educates her that she needs to get consistent with a pain-free stretch program, even on painful days.  OT then does manual therapy IASTM to bilateral sides of her neck and upper trap areas for myofascial release as well as relieving muscle spasms.    OT then educates on new upper trap/side of neck stretches that are done carefully  bilaterally, which she states feels good in relieving.  She was encouraged not to do these 2 briskly or with straining as to cause pain.  We reviewed tricep stretching and the other stretches listed below including another new prayer stretch to work on any potential paresthesia happening into the hands from the ulnar or median nerves.  She was told to watch her sleep postures as this is a key contributor towards nighttime paresthesia.  These postures will likely need monitored in the next session.  We performed radial nerve glides and discuss gently performing scapular retraction and of course continuing manual massage to elbows and self massage to the neck and upper trap.  At the end of the session she states feeling better, that no exercises caused her pain or problems today, and she was asked to get consistent with these on good or bad days.  She was reminded to avoid bad postures, repetition, straining, heat, and working out at a high intensity which is contraindicated for folks with MS.  She states understanding all directions and she was asked to come into therapy more consistently and more often as she has missed several sessions now and come in rsomewhat inconsistently.   Exercises - Standing neck/upper traps stretch  - 2-3 x daily - 3 reps - 15 sec hold - Tricep Stretch- DO SEATED BY TABLE  - 3-4 x daily - 3-5 reps - 15 hold - Fridge Door Stretch  - 4 x daily - 3-5 reps - 15 sec hold - Wrist Prayer Stretch  - 4 x daily - 3-5 reps - 15 sec hold - Wrist Flexion Stretch  - 3-4 x daily - 3-5 reps - 15 sec hold - Seated Scapular Retraction  - 3-4 x daily - 5-10 reps - Standing Radial Nerve Glide  - 3-4 x daily - 1 sets - 5-10 reps - Seated Elbow Manual Massage Clockwise  - 3-4 x daily - 2-3 mins hold     PATIENT EDUCATION: Education details: See tx section above for details  Person educated: Patient  Education method: Verbal Instruction, Teach back, Handouts  Education comprehension: States  and demonstrates understanding, Additional Education required    HOME EXERCISE PROGRAM: Access Code: [email or web address] URL: [email or web address] Date: 07/10/2023 Prepared by: Melvenia Ada   GOALS: Goals reviewed with patient? Yes   SHORT TERM GOALS: (STG required if POC>30 days) Target Date: 07/26/23  Pt will obtain protective, custom orthotic. Goal status: TBD/PRN  2.  Pt will demo/state understanding of initial HEP to improve pain levels and prerequisite motion. Goal status: 07/25/23: Goal met   LONG TERM GOALS: Target Date: 08/23/23  Pt will improve functional ability by decreased impairment per PSFS assessment from 2.3 to 6.5 or better, for better quality of life. Goal status: INITIAL  2.  Pt will improve grip strength in Rt hand from tender 41lbs to at least 55lbs for functional use at home and in IADLs. Goal status: INITIAL  3.  Pt will improve A/ROM in right wrist flexion from 70 degrees to at least 75 degrees, to have functional motion for tasks like reach and grasp.  Goal status: INITIAL  4.  Pt will improve strength in right wrist extension from tender 4/5 MMT to at least 4+/5 MMT to have increased functional ability to carry out selfcare and higher-level homecare tasks with less difficulty. Goal status: INITIAL  5.  Pt will decrease pain at worst from 10/10 to 3/10 or better to have better sleep and occupational participation in daily roles. Goal status: INITIAL   ASSESSMENT:  CLINICAL IMPRESSION: 08/06/23: She has been a bit inconsistent with home exercises, admits that she does not do them out of fear of pain.  Today they all felt great and so she needs to be doing these things consistently at home and coming into therapy consistently.  07/25/23: She has not been consistent with exercises and she has not come into therapy for 2 weeks, has been having some lingering soreness and pain but actually improving on her 10 out of 10 pain at worst.  OT is sure  that if she gets consistent with nonpainful stretches symptoms should help resolve.  She does have a large muscle spasms noted in the distal lateral triceps and biceps which would benefit from dry needling if she is willing.  We can also try some Kinesiotape in upcoming sessions.   Patient is a 43 y.o. female who was seen today for occupational therapy evaluation for soreness, stiffness and tenderness through the right dominant forearm and elbow and wrist areas thought to be lateral epicondylitis in general tendinitis.  The patient will benefit from outpatient occupational therapy to decrease symptoms, improve functional upper extremity use, and increase quality of life.    PLAN:  OT FREQUENCY: 1-2x/week  OT DURATION: 6 weeks through 08/23/23 and up to 12 total visits as needed   PLANNED INTERVENTIONS: 97535 self care/ADL training, 02889 therapeutic exercise, 97530 therapeutic activity, 97112 neuromuscular re-education, 97140 manual therapy, 97035 ultrasound, 97032 electrical stimulation (manual), 97760 Orthotic Initial, 97763 Orthotic/Prosthetic subsequent, compression bandaging, Dry needling, energy conservation, coping strategies training, and patient/family education  RECOMMENDED OTHER SERVICES: none now    CONSULTED AND AGREED WITH PLAN OF CARE: Patient  PLAN FOR NEXT SESSION:   Continue to review nonpainful stretches, activity modifications, look at sleep postures, Consider Kinesiotape, consider dry needling, otherwise continue on with IASTM, heat, try percussion, stretches.  Once pain is low for consistent 2 weeks, consider light eccentric training.  Melvenia Ada, OTR/L, CHT  08/06/2023, 3:42 PM

## 2023-08-06 ENCOUNTER — Encounter: Payer: Self-pay | Admitting: Rehabilitative and Restorative Service Providers"

## 2023-08-06 ENCOUNTER — Ambulatory Visit (INDEPENDENT_AMBULATORY_CARE_PROVIDER_SITE_OTHER): Admitting: Rehabilitative and Restorative Service Providers"

## 2023-08-06 DIAGNOSIS — M6281 Muscle weakness (generalized): Secondary | ICD-10-CM

## 2023-08-06 DIAGNOSIS — M25521 Pain in right elbow: Secondary | ICD-10-CM | POA: Diagnosis not present

## 2023-08-06 DIAGNOSIS — M25531 Pain in right wrist: Secondary | ICD-10-CM | POA: Diagnosis not present

## 2023-08-08 NOTE — Therapy (Signed)
 OUTPATIENT OCCUPATIONAL THERAPY TREATMENT NOTE  Patient Name: Charlene [name] MRN: [id number] DOB:09-12-1980, 43 y.o., female Today's Date: 08/12/2023  PCP: Mahlon BEAL MD REFERRING PROVIDER:  Joane Artist RAMAN, MD    END OF SESSION:  OT End of Session - 08/12/23 1525     Visit Number 4    Number of Visits 12    Date for OT Re-Evaluation 08/23/23    Authorization Type [employer]    OT Start Time 1524    OT Stop Time 1608    OT Time Calculation (min) 44 min    Activity Tolerance Patient tolerated treatment well;No increased pain;Patient limited by fatigue;Patient limited by pain    Behavior During Therapy [facility] for tasks assessed/performed             Past Medical History:  Diagnosis Date   Alcoholism (HCC)    Anxiety    Anxiety and depression    Bipolar 2 disorder (HCC)    Borderline personality disorder (HCC)    Chest pain    Chronic fatigue syndrome    Claustrophobia    on meds   Constipation    Depression    on meds   Elevated blood sugar 06/08/2014   Elevated LFTs    Fatigue    Fatty liver 10/2014   GERD (gastroesophageal reflux disease)    diet related   Heart murmur    Hyperlipidemia    on meds   Infertility associated with anovulation    Joint pain    Low grade squamous intraepithelial lesion (LGSIL) on Pap smear 10/30/2011   Major depressive disorder    Migraines    MS (multiple sclerosis) (HCC)    Neuromuscular disorder (HCC)    Obesity    Obesity in pregnancy, antepartum    PCOS (polycystic ovarian syndrome)    Personal history of pre-term labor    Prediabetes    Pregnancy induced hypertension    pregnancy related- hx of   Psoriasis    Sleep apnea    SOB (shortness of breath)    Vitamin D  deficiency    Past Surgical History:  Procedure Laterality Date   COLONOSCOPY  10/17/2020   DILATION AND CURETTAGE OF UTERUS  08/15/2008   DILATION AND EVACUATION  12/29/2011   Procedure: DILATATION AND EVACUATION;  Surgeon: Winton Felt, MD;  Location: [facility];  Service: Gynecology;;  Dr, Edsel transferred case to Dr. 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   Iron  deficiency anemia 02/15/2022   Notalgia 01/25/2022   Stress 01/25/2022   Insulin  resistance 11/29/2021   Elevated LFTs 11/08/2021   Pre-hypertension 10/17/2021   Vitamin D  deficiency 10/17/2021   SOBOE (shortness of breath on exertion) 10/17/2021   Other hyperlipidemia 08/09/2021   Rosacea 09/15/2020   Physical exam 06/16/2020   Alcohol abuse 05/01/2018   Bipolar disorder (HCC) 06/08/2016   Borderline personality disorder (HCC) 06/08/2016   Obesity (BMI 30-39.9) 12/07/2014   Hyperlipemia 06/08/2014   Elevated blood sugar 06/08/2014  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   Migraines 06/08/2014   Multiple sclerosis (HCC)    History of abnormal cervical Pap smear 10/30/2011   Adjustment disorder with anxiety 01/05/2008   Obesity, unspecified 11/26/2007    ONSET DATE: 3-4 months onset   REFERRING DIAG: M25.521 (ICD-10-CM) - Right elbow pain   THERAPY DIAG:  Muscle weakness (generalized)  Pain in right elbow  Pain in right wrist  Other low back pain  Rationale for Evaluation and Treatment: Rehabilitation  PERTINENT HISTORY: Rt Lat epi, was on MD program  She states states insidious onset 3-4 months ago, thought it was psoriatic arthritis, She was put on meds for that, but months later it didn't help, and Dr.  Joane dx her with Rt lat epi.   She tried MD HEP, but did not work for her independently. At night ain is the worst.   PRECAUTIONS: Other: chronic MS (heat sensitive), psoriatic arthritis, and other conditions   RED FLAGS: None   WEIGHT BEARING RESTRICTIONS: No    SUBJECTIVE:   SUBJECTIVE STATEMENT: She states she has ben feeling about the same since last visit.   She states the elbow is doing better but her hands and neck are the worst symptoms now.       PAIN:  Are you having pain? Yes: NPRS scale:  4/10 at rest now Pain location: Rt lateral/dorsal forearm and wrist areas  Pain description: aching and sometimes sharp Aggravating factors: reaching out elbow and grabbing objects Relieving factors: rest   PATIENT GOALS: To improve pain and symptoms in the right dominant arm  NEXT MD VISIT: As needed    OBJECTIVE: (All objective assessments below are from initial evaluation on: 07/10/23 unless otherwise specified.)   HAND DOMINANCE: Right   ADLs: Overall ADLs: States decreased ability to grab, hold household objects, pain and difficulty to open containers, perform FMS tasks (manipulate fasteners on clothing), mild to moderate bathing problems as well.    FUNCTIONAL OUTCOME MEASURES: Eval: Patient Specific Functional Scale: 2.3 (sleeping, lifting household objects, leaning on elbow/wrist/fa)  (Higher Score  =  Better Ability for the Selected Tasks)      UPPER EXTREMITY ROM     Shoulder to Wrist AROM Right eval Rt 08/12/23  Shoulder flexion    Shoulder abduction    Shoulder extension    Shoulder internal rotation    Shoulder external rotation    Elbow flexion 155   Elbow extension 0   Forearm supination 75   Forearm pronation  85   Wrist flexion 70  (80 in Lt) 80  Wrist extension 72 (70 in Lt)  58  Wrist ulnar deviation    Wrist radial deviation    Functional dart thrower's motion (F-DTM) in ulnar flexion    F-DTM in radial extension     (Blank rows = not  tested)   Hand AROM Right eval  Full Fist Ability (or Gap to Distal Palmar Crease) full  Thumb Opposition  (Kapandji Scale)  WNL  (Blank rows = not tested)   UPPER EXTREMITY MMT:      MMT Right 07/10/2023  Shoulder flexion   Shoulder abduction   Shoulder adduction   Shoulder extension   Shoulder internal rotation   Shoulder external rotation   Middle trapezius   Lower trapezius   Elbow flexion   Elbow extension   Forearm supination 4+/5  Forearm pronation 5/5  Wrist flexion 5/5 MMT  Wrist extension 4/5 MMT tender  Wrist ulnar deviation 5/5  Wrist radial  deviation   (Blank rows = not tested)  HAND FUNCTION:  Eval: Observed weakness in affected Rt hand.  Grip strength Right: 41 tender lbs, Left: 64 lbs   COORDINATION: Eval: No coordination issues other than gross motor skills when extending elbow  SENSATION: Eval:  Light touch intact today, no complaints of paresthesia  OBSERVATIONS:   08/12/23: Right lateral epicondyle is less tender and less symptomatic now, she seems to have more widespread issues recently likely related to psoriatic arthritis and what appears to be a systemic flare.  She is dabbling with different medications for this that hopefully will be helpful as well.   Eval:  she states bicep is someehat sore, ulnar side of wrist is sore, lateral elbow (worst) can be sore and it jumps around  Presents as Right lateral epicondylitis   TODAY'S TREATMENT:  08/12/23: She is times with active range of motion for exercise as well as new measures which shows improvement in wrist flexion but some tenderness and decreased wrist extension in the right arm..   We review her non-painful stretch routine to manage neck issues as well as elbow issues, after which OT educates on several new stretches to help with her bigger issues now of finger stiffness and pain bilaterally.  Composite finger flexion stretches with wrist slightly bent or helpful to help the dorsum of  the right hand and wrist which presents like ECRB or EDC agitation.  Today, like the other days, she struggled to find nonpainful stretches and tends to rush into them, needing cues to slow down and relax.  After cues like this, and OT performance with her, she does find helpful and nonpainful stretches that have some tension but also feel good.    OT also provides her with management of arthritis handout that includes ideas for activity modifications, compression gloves to help with hand pain and symptoms.  She was also recommended to perform light cardiovascular activities as this helps release dopamine and other good chemicals as well as help regulate systemic arthritis as well as not done to aggressively or with too much repetition.  She has no increase in pain, states feeling slightly better by the end of the session.   Exercises - Standing neck/upper traps stretch  - 2-3 x daily - 3 reps - 15 sec hold - Tricep Stretch- DO SEATED BY TABLE  - 3-4 x daily - 3-5 reps - 15 hold - Fridge Door Stretch  - 4 x daily - 3-5 reps - 15 sec hold - Wrist Prayer Stretch  - 4 x daily - 3-5 reps - 15 sec hold - Wrist Flexion Stretch  - 3-4 x daily - 3-5 reps - 15 sec hold - Seated Scapular Retraction  - 3-4 x daily - 5-10 reps - Standing Radial Nerve Glide  - 3-4 x daily - 1 sets - 5-10 reps - Seated Elbow Manual Massage Clockwise  - 3-4 x daily - 2-3 mins hold - PUSH KNUCKLES DOWN  - 4 x daily - 3-5 reps - 15 seconds hold - HOOK Stretch  - 4 x daily - 3-5 reps - 15-20 sec hold - BACK KNUCKLE STRETCHES   - 4 x daily - 3-5 reps - 15 sec hold - Seated Finger Composite Flexion Stretch  - 4 x daily - 3-5 reps - 15 hold - Towel Roll Grip with Forearm in Neutral  - 3 x daily - 5 reps - 10 sec hold     PATIENT EDUCATION: Education details: See  tx section above for details  Person educated: Patient Education method: Verbal Instruction, Teach back, Handouts  Education comprehension: States and demonstrates  understanding, Additional Education required    HOME EXERCISE PROGRAM: Access Code: [email or web address] URL: [email or web address] Date: 07/10/2023 Prepared by: Melvenia Ada   GOALS: Goals reviewed with patient? Yes   SHORT TERM GOALS: (STG required if POC>30 days) Target Date: 07/26/23  Pt will obtain protective, custom orthotic. Goal status: TBD/PRN  2.  Pt will demo/state understanding of initial HEP to improve pain levels and prerequisite motion. Goal status: 07/25/23: Goal met   LONG TERM GOALS: Target Date: 08/23/23  Pt will improve functional ability by decreased impairment per PSFS assessment from 2.3 to 6.5 or better, for better quality of life. Goal status: INITIAL  2.  Pt will improve grip strength in Rt hand from tender 41lbs to at least 55lbs for functional use at home and in IADLs. Goal status: INITIAL  3.  Pt will improve A/ROM in right wrist flexion from 70 degrees to at least 75 degrees, to have functional motion for tasks like reach and grasp.  Goal status: INITIAL  4.  Pt will improve strength in right wrist extension from tender 4/5 MMT to at least 4+/5 MMT to have increased functional ability to carry out selfcare and higher-level homecare tasks with less difficulty. Goal status: INITIAL  5.  Pt will decrease pain at worst from 10/10 to 3/10 or better to have better sleep and occupational participation in daily roles. Goal status: INITIAL   ASSESSMENT:  CLINICAL IMPRESSION: 08/12/23: He is now getting consistent with home exercises, she is also getting along list that she needs to manage depending on how she feels.  Compression gloves would definitely be a boon to her.  We will continue to address issues that she has  08/06/23: She has been a bit inconsistent with home exercises, admits that she does not do them out of fear of pain.  Today they all felt great and so she needs to be doing these things consistently at home and coming into therapy  consistently.   PLAN:  OT FREQUENCY: 1-2x/week  OT DURATION: 6 weeks through 08/23/23 and up to 12 total visits as needed   PLANNED INTERVENTIONS: 97535 self care/ADL training, 02889 therapeutic exercise, 97530 therapeutic activity, 97112 neuromuscular re-education, 97140 manual therapy, 97035 ultrasound, 97032 electrical stimulation (manual), 97760 Orthotic Initial, [id number] Orthotic/Prosthetic subsequent, compression bandaging, Dry needling, energy conservation, coping strategies training, and patient/family education  RECOMMENDED OTHER SERVICES: none now    CONSULTED AND AGREED WITH PLAN OF CARE: Patient  PLAN FOR NEXT SESSION:   Check new hand stretches as helpful,   consider Kinesiotape, consider dry needling, otherwise continue on with IASTM, heat, try percussion, stretches.  Once pain is low for consistent 2 weeks, consider light eccentric training.  Consider addressing sleep postures  needs progress note by 08/23/23   Melvenia Ada, OTR/L, CHT  08/12/2023, 4:28 PM

## 2023-08-08 NOTE — Progress Notes (Signed)
 [facility] OFFICE PROGRESS NOTE  [name], MD [phone] [address] [place] [address]  DIAGNOSIS: Iron  deficiency anemia secondary to history of gastric ulcer and hemorrhoids   PRIOR THERAPY: Iron  infusion with Venofer  300 Mg IV weekly for 3 weeks most recent dose on 09/11/23   CURRENT THERAPY: Over-the-counter ferrous sulfate  with vitamin C vs multivitamin   INTERVAL HISTORY: Charlene [name] 43 y.o. female returns to clinic today for a follow-up visit.  The patient is seen for iron  deficiency anemia. She had her hemorrhoids banded though and has not had any rectal bleeding since that time. She was last seen in the clinic on 05/22/23.   Her Hbg has been stable over the last year. Last month, she had her CBC checked and her Hbg was normal at 14.5. She had labs performed at her dermatologist recently which showed new onset anemia with Hbg 10.4. Today her Hbg is 9.1.   She has a history of psoriatic arthritis and has been taking high doses of NSAIDs, including 800 mg of ibuprofen  three times a day, and Tylenol  Arthritis 650 mg, which she felt was ineffective. Additionally, she uses Bayer Rapid Relief powder, which contains aspirin. She has been struggling with  this for about 6 months. She also was started on a new medication for MS called Acthar.   Of note, the patient has history of erosive esophagitis without bleeding and gastritic on her endoscopy from May 2024.   No black or tarry stools, but she has experienced constipation and occasional bright red blood when severely constipated but this only really happened once. She has not been taking Prilosec recently.   She does not take Metformin  or Integra Plus  due to insurance issues. She takes over-the-counter iron  supplements inconsistently, approximately every few days, along with vitamin C and a multivitamin with iron . She experiences constipation, which she attributes to her medication regimen, and tries to balance her  iron  intake accordingly.  She experiences fatigue and dizziness, especially when standing up quickly. She is unsure if this is due to anemia, MS, POTS, or another cause. This occurred even when her Hbg was normal.   She also mentions experiencing nausea and has been losing weight intentionally as part of a weight loss program, having lost nearly 50 pounds.  Her menstrual periods have become lighter, and she suspects she may be perimenopausal. She experienced some cramping a few weeks ago, which was unusual for her. No significant abdominal pain or visible blood.    She is here for evaluation and repeat blood work.   MEDICAL HISTORY: Past Medical History:  Diagnosis Date   Alcoholism (HCC)    Anxiety    Anxiety and depression    Bipolar 2 disorder (HCC)    Borderline personality disorder (HCC)    Chest pain    Chronic fatigue syndrome    Claustrophobia    on meds   Constipation    Depression    on meds   Elevated blood sugar 06/08/2014   Elevated LFTs    Fatigue    Fatty liver 10/2014   GERD (gastroesophageal reflux disease)    diet related   Heart murmur    Hyperlipidemia    on meds   Infertility associated with anovulation    Joint pain    Low grade squamous intraepithelial lesion (LGSIL) on Pap smear 10/30/2011   Major depressive disorder    Migraines    MS (multiple sclerosis) (HCC)    Neuromuscular  disorder (HCC)    Obesity    Obesity in pregnancy, antepartum    PCOS (polycystic ovarian syndrome)    Personal history of pre-term labor    Prediabetes    Pregnancy induced hypertension    pregnancy related- hx of   Psoriasis    Sleep apnea    SOB (shortness of breath)    Vitamin D  deficiency     ALLERGIES:  is allergic to ciprofloxacin, african mango [irvingia gabonensis], atorvastatin , and rosuvastatin .  MEDICATIONS:  Current Outpatient Medications  Medication Sig Dispense Refill   pantoprazole  (PROTONIX ) 40 MG tablet Take 1 tablet (40 mg total) by mouth  daily. 30 tablet 1   ALPRAZolam  (XANAX ) 1 MG tablet Take 1-2 pills about 30 min prior to the MRI. Can take additional tablet at the time of test if needed.Can cause drowsiness. Must have driver to and from the test 2 tablet 0   clobetasol cream (TEMOVATE) 0.05 % Apply 1 Application topically 2 (two) times daily.     COSENTYX UNOREADY 300 MG/2ML SOAJ 300 mg.     gabapentin (NEURONTIN) 100 MG capsule Take 100 mg by mouth 2 (two) times daily as needed.     lamoTRIgine (LAMICTAL) 200 MG tablet Take 200 mg by mouth daily.     Multiple Vitamin (MULTIVITAMIN) tablet Take 1 tablet by mouth daily.     omeprazole  (PRILOSEC) 40 MG capsule TAKE 1 CAPSULE(40 MG) BY MOUTH DAILY 90 capsule 3   ondansetron  (ZOFRAN ) 4 MG tablet Take 1 tablet (4 mg total) by mouth every 6 (six) hours as needed for nausea or vomiting. 20 tablet 0   Sulfacetamide Sodium-Sulfur 10-2 % LIQD Apply topically as needed.     tirzepatide  (ZEPBOUND ) 12.5 MG/0.5ML Pen Inject 12.5 mg into the skin once a week. 2 mL 1   Vitamin D , Ergocalciferol , (DRISDOL ) 1.25 MG (50000 UNIT) CAPS capsule Take 1 capsule (50,000 Units total) by mouth every 7 (seven) days. 12 capsule 3   VTAMA 1 % CREA Apply 1 Application topically daily.     VYVANSE 60 MG capsule Take 60 mg by mouth every morning.     zolmitriptan  (ZOMIG ) 5 MG tablet TAKE ONE TABLET BY MOUTH AS NEEDED FOR MIGRAINES. MAY REPEAT IN TWO HOURS IF NEEDED. MAX 2 TABLETS A DAY OR 4 TABLETS A WEEK 10 tablet 6   No current facility-administered medications for this visit.    SURGICAL HISTORY:  Past Surgical History:  Procedure Laterality Date   COLONOSCOPY  10/17/2020   DILATION AND CURETTAGE OF UTERUS  08/15/2008   DILATION AND EVACUATION  12/29/2011   Procedure: DILATATION AND EVACUATION;  Surgeon: Winton Felt, MD;  Location: [facility];  Service: Gynecology;;  Dr, Edsel transferred case to Dr. Felt   LAPAROSCOPIC GASTRIC SLEEVE RESECTION N/A 12/07/2014   Procedure: LAPAROSCOPIC GASTRIC  SLEEVE RESECTION;  Surgeon: Alm Angle, MD;  Location: [facility];  Service: General;  Laterality: N/A;    REVIEW OF SYSTEMS:   Review of Systems  Constitutional: Positive for fatigue. Negative for appetite change, chills, fever and unexpected weight change.  HENT: Negative for mouth sores, nosebleeds, sore throat and trouble swallowing.   Eyes: Negative for eye problems and icterus.  Respiratory: Negative for hemoptysis, shortness of breath and wheezing.   Cardiovascular: Negative for chest pain and leg swelling.  Gastrointestinal: Positive for occasional constipation. Negative for abdominal pain, diarrhea, nausea and vomiting.  Genitourinary: Negative for bladder incontinence, difficulty urinating, dysuria, frequency and hematuria.   Musculoskeletal: Positive for joint pain.  Negative for gait problem, neck pain and neck stiffness.  Skin: Negative for itching and rash.  Neurological: Positive for occasional dizziness. Negative for extremity weakness, gait problem, headaches, and seizures.  Hematological: Negative for adenopathy. Does not bruise/bleed easily.  Psychiatric/Behavioral: Negative for confusion, depression and sleep disturbance. The patient is not nervous/anxious.     PHYSICAL EXAMINATION:  Blood pressure (!) 119/58, pulse 91, temperature (!) 97.4 F (36.3 C), temperature source Tympanic, resp. rate 18, height 5' 5.5 (1.664 m), weight 198 lb 4.8 oz (89.9 kg), last menstrual period 06/24/2023, SpO2 100%.  ECOG PERFORMANCE STATUS: 1  Physical Exam  Constitutional: Oriented to person, place, and time and well-developed, well-nourished, and in no distress.  HENT:  Head: Normocephalic and atraumatic.  Mouth/Throat: Oropharynx is clear and moist. No oropharyngeal exudate.  Eyes: Conjunctivae are normal. Right eye exhibits no discharge. Left eye exhibits no discharge. No scleral icterus.  Neck: Normal range of motion. Neck supple.  Cardiovascular: Normal rate, regular rhythm,  normal heart sounds and intact distal pulses.   Pulmonary/Chest: Effort normal and breath sounds normal. No respiratory distress. No wheezes. No rales.  Abdominal: Soft. Bowel sounds are normal. Exhibits no distension and no mass. There is no tenderness.  Musculoskeletal: Normal range of motion. Exhibits no edema.  Lymphadenopathy:    No cervical adenopathy.  Neurological: Alert and oriented to person, place, and time. Exhibits normal muscle tone. Gait normal. Coordination normal.  Skin: Skin is warm and dry. No rash noted. Not diaphoretic. No erythema. No pallor.  Psychiatric: Mood, memory and judgment normal.  Vitals reviewed.  LABORATORY DATA: Lab Results  Component Value Date   WBC 4.5 08/14/2023   HGB 9.1 (L) 08/14/2023   HCT 25.9 (L) 08/14/2023   MCV 88.4 08/14/2023   PLT 320 08/14/2023      Chemistry      Component Value Date/Time   NA 139 08/14/2023 0905   NA 142 07/01/2023 0904   K 3.4 (L) 08/14/2023 0905   CL 107 08/14/2023 0905   CO2 29 08/14/2023 0905   BUN 13 08/14/2023 0905   BUN 11 07/01/2023 0904   CREATININE 0.54 08/14/2023 0905   CREATININE 0.43 (L) 12/31/2013 1542      Component Value Date/Time   CALCIUM  8.5 (L) 08/14/2023 0905   ALKPHOS 58 08/14/2023 0905   AST 20 08/14/2023 0905   ALT 52 (H) 08/14/2023 0905   BILITOT 0.4 08/14/2023 0905       RADIOGRAPHIC STUDIES:  No results found.   ASSESSMENT/PLAN:  This is a very pleasant 43 year old Caucasian female with iron  deficiency anemia secondary to GI blood loss.   The patient receives IV iron  as needed with Venofer  the most recent dose being in August 2024.  She is currently taking a iron  supplement a few times a week with vitamin C. She also takes a multivitamin with iron .   The patient's labs today show new onset anemia with Hbg of 9.1 compared to 1 month ago Hbg of 14.5. She also had labs drawn recently at dermatology which also confirms new anemia.   Her iron  studies are pending but I am  anticipating she will need IV iron  and I have ordered venofer  300 mg weekly at the [address] [facility].   I am concerned she may have GI blood loss due to recent high dose and prolonged NSAID and aspirin use. Her MS medication also had a disease related precaution listed for GI bleeding. She denies black tarry stool. I  gave her stool cards. I will reach out to her gastroenterologist to see if endoscopic evaluation is warranted.   We will see her in 4-6 weeks for close monitoring of her labs. We reviewed signs and symptoms of worsening anemia. Would consider blood transfusion if Hbg <8.   I also sent her a prescription for protonix . She states last time she had esophagitis and gastritis she did not have symptoms.   She knows to call if she gets new or worsening symptoms. We reviewed symptoms of worsening anemia.   The patient was advised to call immediately if she has any concerning symptoms in the interval. The patient voices understanding of current disease status and treatment options and is in agreement with the current care plan. All questions were answered. The patient knows to call the clinic with any problems, questions or concerns. We can certainly see the patient much sooner if necessary     Orders Placed This Encounter  Procedures   Vitamin B12    Standing Status:   Future    Number of Occurrences:   1    Expected Date:   08/14/2023    Expiration Date:   08/13/2024   Folate    Standing Status:   Future    Number of Occurrences:   1    Expected Date:   08/14/2023    Expiration Date:   08/13/2024   CBC with Differential (Cancer Center Only)    Standing Status:   Future    Expected Date:   09/14/2023    Expiration Date:   08/13/2024   Ferritin    Standing Status:   Future    Expected Date:   09/14/2023    Expiration Date:   08/13/2024   Iron  and Iron  Binding Capacity ([facility] only)    Standing Status:   Future    Expected Date:   09/14/2023    Expiration Date:    08/13/2024     The total time spent in the appointment was 30-39 minutes  Sury Wentworth L Brie Eppard, PA-C 08/14/23

## 2023-08-09 ENCOUNTER — Encounter: Admitting: Rehabilitative and Restorative Service Providers"

## 2023-08-12 ENCOUNTER — Ambulatory Visit (INDEPENDENT_AMBULATORY_CARE_PROVIDER_SITE_OTHER): Admitting: Rehabilitative and Restorative Service Providers"

## 2023-08-12 ENCOUNTER — Encounter: Payer: Self-pay | Admitting: Rehabilitative and Restorative Service Providers"

## 2023-08-12 DIAGNOSIS — M25521 Pain in right elbow: Secondary | ICD-10-CM

## 2023-08-12 DIAGNOSIS — M6281 Muscle weakness (generalized): Secondary | ICD-10-CM | POA: Diagnosis not present

## 2023-08-12 DIAGNOSIS — M5459 Other low back pain: Secondary | ICD-10-CM | POA: Diagnosis not present

## 2023-08-12 DIAGNOSIS — M25531 Pain in right wrist: Secondary | ICD-10-CM

## 2023-08-13 ENCOUNTER — Encounter: Payer: Self-pay | Admitting: Internal Medicine

## 2023-08-13 ENCOUNTER — Other Ambulatory Visit: Payer: Self-pay | Admitting: Physician Assistant

## 2023-08-13 DIAGNOSIS — D509 Iron deficiency anemia, unspecified: Secondary | ICD-10-CM

## 2023-08-13 NOTE — Therapy (Signed)
 OUTPATIENT OCCUPATIONAL THERAPY TREATMENT NOTE  Patient Name: Charlene [name] MRN: [id number] DOB:[birth year], 43 y.o., female Today's Date: 08/14/2023  PCP: Mahlon BEAL MD REFERRING PROVIDER:  Joane Artist RAMAN, MD    END OF SESSION:  OT End of Session - 08/14/23 1150     Visit Number 5    Number of Visits 12    Date for OT Re-Evaluation 08/23/23    Authorization Type [employer]    OT Start Time 1150    OT Stop Time 1237    OT Time Calculation (min) 47 min    Activity Tolerance Patient tolerated treatment well;No increased pain;Patient limited by fatigue;Patient limited by pain    Behavior During Therapy [facility] for tasks assessed/performed           Past Medical History:  Diagnosis Date   Alcoholism (HCC)    Anxiety    Anxiety and depression    Bipolar 2 disorder (HCC)    Borderline personality disorder (HCC)    Chest pain    Chronic fatigue syndrome    Claustrophobia    on meds   Constipation    Depression    on meds   Elevated blood sugar 06/08/2014   Elevated LFTs    Fatigue    Fatty liver 10/2014   GERD (gastroesophageal reflux disease)    diet related   Heart murmur    Hyperlipidemia    on meds   Infertility associated with anovulation    Joint pain    Low grade squamous intraepithelial lesion (LGSIL) on Pap smear 10/30/2011   Major depressive disorder    Migraines    MS (multiple sclerosis) (HCC)    Neuromuscular disorder (HCC)    Obesity    Obesity in pregnancy, antepartum    PCOS (polycystic ovarian syndrome)    Personal history of pre-term labor    Prediabetes    Pregnancy induced hypertension    pregnancy related- hx of   Psoriasis    Sleep apnea    SOB (shortness of breath)    Vitamin D  deficiency    Past Surgical History:  Procedure Laterality Date   COLONOSCOPY  10/17/2020   DILATION AND CURETTAGE OF UTERUS  08/15/2008   DILATION AND EVACUATION  12/29/2011   Procedure: DILATATION AND EVACUATION;  Surgeon: Winton Felt, MD;  Location: [facility];   Service: Gynecology;;  Dr, Edsel transferred case to Dr. 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   Iron  deficiency anemia 02/15/2022   Notalgia 01/25/2022   Stress 01/25/2022   Insulin  resistance 11/29/2021   Elevated LFTs 11/08/2021   Pre-hypertension 10/17/2021   Vitamin D  deficiency 10/17/2021   SOBOE (shortness of breath on exertion) 10/17/2021   Other hyperlipidemia 08/09/2021   Rosacea 09/15/2020   Physical exam 06/16/2020   Alcohol abuse 05/01/2018   Bipolar disorder (HCC) 06/08/2016   Borderline personality disorder (HCC) 06/08/2016   Obesity (BMI 30-39.9) 12/07/2014   Hyperlipemia 06/08/2014   Elevated blood sugar 06/08/2014   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   Migraines 06/08/2014   Multiple sclerosis (HCC)    History of abnormal cervical Pap smear 10/30/2011   Adjustment disorder with anxiety 01/05/2008   Obesity, unspecified 11/26/2007    ONSET DATE: 3-4 months onset   REFERRING DIAG: M25.521 (ICD-10-CM) - Right elbow pain   THERAPY DIAG:  Muscle weakness (generalized)  Pain in right elbow  Pain in right wrist  Rationale for Evaluation and Treatment: Rehabilitation  PERTINENT HISTORY: Rt Lat epi, was on MD program  She states states insidious onset 3-4 months ago, thought it was psoriatic arthritis, She was put on meds for that, but months later it didn't help, and Dr. Joane dx her with Rt lat epi.    She tried MD HEP, but did not work for her independently. At night ain is the worst.   PRECAUTIONS: Other: chronic MS (heat sensitive), psoriatic arthritis, and other conditions   RED FLAGS: None   WEIGHT BEARING RESTRICTIONS: No    SUBJECTIVE:   SUBJECTIVE STATEMENT: She states having similar soreness to earlier in the week.  She states tolerating exercises well.  She has a hard time admitting when things feel good, and states I will never feel great at one point in the session.  OT does discussed with her the mental aspect of pain    PAIN:  Are you having pain? Yes: NPRS scale:  5/10 at rest now Pain location: Rt lateral/dorsal forearm and wrist areas  Pain description: aching and sometimes sharp Aggravating factors: reaching out elbow and grabbing objects Relieving factors: rest   PATIENT GOALS: To improve pain and symptoms in the right dominant arm  NEXT MD VISIT: As needed    OBJECTIVE: (All objective assessments below are from initial evaluation on: 07/10/23 unless otherwise specified.)   HAND DOMINANCE: Right   ADLs: Overall ADLs: States decreased ability to grab, hold household objects, pain and difficulty to open containers, perform FMS tasks (manipulate fasteners on clothing), mild to moderate bathing problems as well.    FUNCTIONAL OUTCOME MEASURES: Eval: Patient Specific Functional Scale: 2.3 (sleeping, lifting household objects, leaning on elbow/wrist/fa)  (Higher Score  =  Better Ability for the Selected Tasks)      UPPER EXTREMITY ROM     Shoulder to Wrist AROM Right eval Rt 08/12/23  Shoulder flexion    Shoulder abduction    Shoulder extension    Shoulder internal rotation    Shoulder external rotation    Elbow flexion 155   Elbow extension 0   Forearm supination 75   Forearm pronation  85   Wrist flexion 70  (80 in Lt) 80  Wrist extension 72 (70 in Lt)  58  Wrist ulnar deviation    Wrist radial deviation    Functional dart thrower's  motion (F-DTM) in ulnar flexion    F-DTM in radial extension     (Blank rows = not tested)   Hand AROM Right eval  Full Fist Ability (or Gap to Distal Palmar Crease) full  Thumb Opposition  (Kapandji Scale)  WNL  (Blank rows = not tested)   UPPER EXTREMITY MMT:      MMT Right 07/10/2023  Shoulder flexion   Shoulder abduction   Shoulder adduction   Shoulder extension   Shoulder internal rotation   Shoulder external rotation   Middle trapezius   Lower trapezius   Elbow flexion   Elbow extension   Forearm supination 4+/5  Forearm pronation 5/5  Wrist flexion 5/5 MMT  Wrist extension  4/5 MMT tender  Wrist ulnar deviation 5/5  Wrist radial deviation   (Blank rows = not tested)  HAND FUNCTION:  Eval: Observed weakness in affected Rt hand.  Grip strength Right: 41 tender lbs, Left: 64 lbs   COORDINATION: Eval: No coordination issues other than gross motor skills when extending elbow  SENSATION: Eval:  Light touch intact today, no complaints of paresthesia  OBSERVATIONS:   08/12/23: Right lateral epicondyle is less tender and less symptomatic now, she seems to have more widespread issues recently likely related to psoriatic arthritis and what appears to be a systemic flare.  She is dabbling with different medications for this that hopefully will be helpful as well.   Eval:  she states bicep is someehat sore, ulnar side of wrist is sore, lateral elbow (worst) can be sore and it jumps around  Presents as Right lateral epicondylitis   TODAY'S TREATMENT:  08/14/23: While she is on moist heat for her neck and her bilateral hands, OT reviews new finger stretches with her for management of joint stiffness and pain.  OT then does manual therapy precaution around bilateral scapulae as well as the right dorsal forearm.  Additionally OT does myofascial release around the tight bilateral proximal areas in her neck.  She states this feels amazing and that it helps her feel looser  and better.    Next, she performs upper neck and shoulder stretches, followed by wrist flexion and extension stretches and review of finger and thumb stretches.    She states having some discomfort through the third finger of the right hand extensor system  (which is common with the EDC/wrist tendinitis ) so OT uses Kinesiotape to help support her wrist and middle finger extension and also tapes her right thumb as well.  She states this feels supportive and makes her feel somewhat better.  She does state buying compression gloves but did not bring them today.    At the end of the session she states feeling better but I never feel great, and she was encouraged to 'never say never.'  OT again educated on daily activities and routines and emphasizes that these types of habits are what causes tendinitis and problems and they must be considered in her lifestyle     Exercises reviewed and performed today: - Standing neck/upper traps stretch  - 2-3 x daily - 3 reps - 15 sec hold - Tricep Stretch- DO SEATED BY TABLE  - 3-4 x daily - 3-5 reps - 15 hold - Fridge Door Stretch  - 4 x daily - 3-5 reps - 15 sec hold - Wrist Prayer Stretch  - 4 x daily - 3-5 reps - 15 sec hold - Wrist Flexion Stretch  - 3-4 x daily - 3-5 reps - 15 sec hold - Seated Scapular Retraction  - 3-4 x daily - 5-10 reps - Standing Radial Nerve Glide  - 3-4 x daily - 1 sets - 5-10 reps - Seated Elbow Manual Massage Clockwise  - 3-4 x daily - 2-3 mins hold - PUSH KNUCKLES DOWN  - 4 x daily - 3-5 reps - 15 seconds hold - HOOK Stretch  - 4 x daily - 3-5 reps - 15-20 sec hold - BACK KNUCKLE STRETCHES   - 4 x daily - 3-5 reps - 15 sec hold - Seated Finger Composite Flexion Stretch  - 4 x daily - 3-5 reps - 15 hold - Towel Roll Grip with Forearm in Neutral  - 3 x daily -  5 reps - 10 sec hold     PATIENT EDUCATION: Education details: See tx section above for details  Person educated: Patient Education method: Verbal Instruction, Teach  back, Handouts  Education comprehension: States and demonstrates understanding, Additional Education required    HOME EXERCISE PROGRAM: Access Code: [email or web address] URL: [email or web address] Date: 07/10/2023 Prepared by: Melvenia Ada   GOALS: Goals reviewed with patient? Yes   SHORT TERM GOALS: (STG required if POC>30 days) Target Date: 07/26/23  Pt will obtain protective, custom orthotic. Goal status: TBD/PRN  2.  Pt will demo/state understanding of initial HEP to improve pain levels and prerequisite motion. Goal status: 07/25/23: Goal met   LONG TERM GOALS: Target Date: 08/23/23  Pt will improve functional ability by decreased impairment per PSFS assessment from 2.3 to 6.5 or better, for better quality of life. Goal status: INITIAL  2.  Pt will improve grip strength in Rt hand from tender 41lbs to at least 55lbs for functional use at home and in IADLs. Goal status: INITIAL  3.  Pt will improve A/ROM in right wrist flexion from 70 degrees to at least 75 degrees, to have functional motion for tasks like reach and grasp.  Goal status: INITIAL  4.  Pt will improve strength in right wrist extension from tender 4/5 MMT to at least 4+/5 MMT to have increased functional ability to carry out selfcare and higher-level homecare tasks with less difficulty. Goal status: INITIAL  5.  Pt will decrease pain at worst from 10/10 to 3/10 or better to have better sleep and occupational participation in daily roles. Goal status: INITIAL   ASSESSMENT:  CLINICAL IMPRESSION: 08/14/23: She felt amazing after manual therapy today, and kinesiotaping is helpful and supportive to her.  She needs to be able to do these things consistently for herself and avoid exacerbation with daily activities and routines.   08/12/23: He is now getting consistent with home exercises, she is also getting along list that she needs to manage depending on how she feels.  Compression gloves would definitely  be a boon to her.  We will continue to address issues that she has  08/06/23: She has been a bit inconsistent with home exercises, admits that she does not do them out of fear of pain.  Today they all felt great and so she needs to be doing these things consistently at home and coming into therapy consistently.   PLAN:  OT FREQUENCY: 1-2x/week  OT DURATION: 6 weeks through 08/23/23 and up to 12 total visits as needed   PLANNED INTERVENTIONS: 97535 self care/ADL training, 02889 therapeutic exercise, 97530 therapeutic activity, 97112 neuromuscular re-education, 97140 manual therapy, 97035 ultrasound, 97032 electrical stimulation (manual), 97760 Orthotic Initial, [id number] Orthotic/Prosthetic subsequent, compression bandaging, Dry needling, energy conservation, coping strategies training, and patient/family education  RECOMMENDED OTHER SERVICES: none now    CONSULTED AND AGREED WITH PLAN OF CARE: Patient  PLAN FOR NEXT SESSION:   Progress note check kt tape    Melvenia Ada, OTR/L, CHT  08/14/2023, 2:14 PM

## 2023-08-14 ENCOUNTER — Inpatient Hospital Stay (HOSPITAL_BASED_OUTPATIENT_CLINIC_OR_DEPARTMENT_OTHER): Admitting: Physician Assistant

## 2023-08-14 ENCOUNTER — Inpatient Hospital Stay: Attending: Internal Medicine

## 2023-08-14 ENCOUNTER — Other Ambulatory Visit: Payer: Self-pay | Admitting: Physician Assistant

## 2023-08-14 ENCOUNTER — Ambulatory Visit (INDEPENDENT_AMBULATORY_CARE_PROVIDER_SITE_OTHER): Admitting: Rehabilitative and Restorative Service Providers"

## 2023-08-14 ENCOUNTER — Telehealth: Payer: Self-pay

## 2023-08-14 ENCOUNTER — Encounter: Payer: Self-pay | Admitting: Rehabilitative and Restorative Service Providers"

## 2023-08-14 VITALS — BP 119/58 | HR 91 | Temp 97.4°F | Resp 18 | Ht 65.5 in | Wt 198.3 lb

## 2023-08-14 DIAGNOSIS — M25521 Pain in right elbow: Secondary | ICD-10-CM | POA: Diagnosis not present

## 2023-08-14 DIAGNOSIS — Z79899 Other long term (current) drug therapy: Secondary | ICD-10-CM | POA: Diagnosis not present

## 2023-08-14 DIAGNOSIS — M25531 Pain in right wrist: Secondary | ICD-10-CM

## 2023-08-14 DIAGNOSIS — D649 Anemia, unspecified: Secondary | ICD-10-CM

## 2023-08-14 DIAGNOSIS — D509 Iron deficiency anemia, unspecified: Secondary | ICD-10-CM

## 2023-08-14 DIAGNOSIS — D508 Other iron deficiency anemias: Secondary | ICD-10-CM | POA: Diagnosis present

## 2023-08-14 DIAGNOSIS — Z8711 Personal history of peptic ulcer disease: Secondary | ICD-10-CM | POA: Insufficient documentation

## 2023-08-14 DIAGNOSIS — M6281 Muscle weakness (generalized): Secondary | ICD-10-CM

## 2023-08-14 LAB — CBC WITH DIFFERENTIAL (CANCER CENTER ONLY)
Abs Immature Granulocytes: 0.02 K/uL (ref 0.00–0.07)
Basophils Absolute: 0.1 K/uL (ref 0.0–0.1)
Basophils Relative: 2 %
Eosinophils Absolute: 0.5 K/uL (ref 0.0–0.5)
Eosinophils Relative: 10 %
HCT: 25.9 % — ABNORMAL LOW (ref 36.0–46.0)
Hemoglobin: 9.1 g/dL — ABNORMAL LOW (ref 12.0–15.0)
Immature Granulocytes: 0 %
Lymphocytes Relative: 19 %
Lymphs Abs: 0.9 K/uL (ref 0.7–4.0)
MCH: 31.1 pg (ref 26.0–34.0)
MCHC: 35.1 g/dL (ref 30.0–36.0)
MCV: 88.4 fL (ref 80.0–100.0)
Monocytes Absolute: 0.8 K/uL (ref 0.1–1.0)
Monocytes Relative: 18 %
Neutro Abs: 2.2 K/uL (ref 1.7–7.7)
Neutrophils Relative %: 51 %
Platelet Count: 320 K/uL (ref 150–400)
RBC: 2.93 MIL/uL — ABNORMAL LOW (ref 3.87–5.11)
RDW: 10.6 % — ABNORMAL LOW (ref 11.5–15.5)
WBC Count: 4.5 K/uL (ref 4.0–10.5)
nRBC: 0 % (ref 0.0–0.2)

## 2023-08-14 LAB — IRON AND IRON BINDING CAPACITY (CC-WL,HP ONLY)
Iron: 222 ug/dL — ABNORMAL HIGH (ref 28–170)
Saturation Ratios: 96 % — ABNORMAL HIGH (ref 10.4–31.8)
TIBC: 231 ug/dL — ABNORMAL LOW (ref 250–450)
UIBC: 9 ug/dL — ABNORMAL LOW (ref 148–442)

## 2023-08-14 LAB — FOLATE: Folate: 10.1 ng/mL (ref >5.9)

## 2023-08-14 LAB — CMP (CANCER CENTER ONLY)
ALT: 52 U/L — ABNORMAL HIGH (ref 0–44)
AST: 20 U/L (ref 15–41)
Albumin: 3.9 g/dL (ref 3.5–5.0)
Alkaline Phosphatase: 58 U/L (ref 38–126)
Anion gap: 3 — ABNORMAL LOW (ref 5–15)
BUN: 13 mg/dL (ref 6–20)
CO2: 29 mmol/L (ref 22–32)
Calcium: 8.5 mg/dL — ABNORMAL LOW (ref 8.9–10.3)
Chloride: 107 mmol/L (ref 98–111)
Creatinine: 0.54 mg/dL (ref 0.44–1.00)
GFR, Estimated: >60 mL/min (ref >60)
Glucose, Bld: 82 mg/dL (ref 70–99)
Potassium: 3.4 mmol/L — ABNORMAL LOW (ref 3.5–5.1)
Sodium: 139 mmol/L (ref 135–145)
Total Bilirubin: 0.4 mg/dL (ref 0.0–1.2)
Total Protein: 6.4 g/dL — ABNORMAL LOW (ref 6.5–8.1)

## 2023-08-14 LAB — VITAMIN B12: Vitamin B-12: 378 pg/mL (ref 180–914)

## 2023-08-14 LAB — FERRITIN: Ferritin: 496 ng/mL — ABNORMAL HIGH (ref 11–307)

## 2023-08-14 MED ORDER — PANTOPRAZOLE SODIUM 40 MG PO TBEC: 40.0000 mg | DELAYED_RELEASE_TABLET | Freq: Every day | ORAL | 1 refills | Status: DC

## 2023-08-14 NOTE — Telephone Encounter (Signed)
 Spoke with patient regarding recent lab results. Per providers, patient's iron  and ferritin levels are unexpectedly elevated while hemoglobin levels have decreased with concerns of bleeding. Informed patient that due to the high iron  levels, she does not need to receive an iron  infusion at this time. Providers have recommended that patient proceed with completing stool cards.  Patient shared that she has arthritis and has been taking NSAIDs, aspirin, and Tylenol  for pain relief. She expressed concern as the stool card instructions advise against NSAIDs and aspirin 7 days prior to starting. Advised patient to consider alternative pain relief options such as Tiger Balm, Voltaren gel, and pain patches during the day, and to use Tylenol  at night until stool cards are completed.  Cassie, PA has sent a message to the patient's gastroenterologist to inquire about the possibility of a scope procedure ands details of today's visit. Informed patient that she may also message her gastroenterologist directly with any questions.   Cassie, PA would like to see the patient back in approximately 3 weeks. Appointments are scheduled for 8/21 with labs at 11:00 AM and follow-up visit with Cassie at 11:30 AM. Patient voiced understanding and was encouraged to call with any concerns or questions.

## 2023-08-16 ENCOUNTER — Telehealth: Payer: Self-pay | Admitting: *Deleted

## 2023-08-16 NOTE — Telephone Encounter (Signed)
 I scheduled patient with Camie Furbish PA for 8/14 at 2:30 pm. Patient aware of the appointment with Camie.     ===View-only below this line=== ----- Message ----- From: Shila Gustav GAILS, MD Sent: 08/16/2023   6:38 AM EDT To: Grayce CHRISTELLA Loge, CMA; Cassandra L Heilingo* Subject: RE: Endoscopic Evaluation - New-Onset IDA in*  We will bring her in for office visit for eval.  Derryl Uher can you please schedule next available APP appointment? Thanks VN ----- Message ----- From: Heilingoetter, Cassandra LITTIE, PA-C Sent: 08/14/2023  10:18 AM EDT To: Gustav Shila GAILS, MD Subject: Endoscopic Evaluation - New-Onset IDA in Set*  Dear Dr. Shila,   I'm reaching out regarding our mutual patient, a 43 year old female currently under our care for iron  deficiency anemia (IDA). Her Hbg had been stable for at least the last year. She has a known history of psoriatic arthritis and has been taking high-dose NSAIDs (ibuprofen  800 mg TID) along with aspirin-containing powders over a prolonged period.  Her hemoglobin has dropped significantly over the past month-from 14.5 (6/16) to 9.1 (7/30). This was corroborated by recent labs from dermatology, which showed a hemoglobin of 10.4. She denies heavy menstrual bleeding, melena, or overt GI symptoms. I have provided her with stool occult blood cards for further evaluation.  She has a known history of gastritis and esophagitis per endoscopy performed with you in May 2024, although she reported being asymptomatic at the time. Given her current anemia, recent medication history, and prior GI findings, I've initiated her on Protonix .  I'm writing to ask if you feel a repeat endoscopic evaluation is indicated at this time to rule out an NSAID-induced ulcer or other source of occult bleeding?  Thank you for your time and consideration.  Cassie

## 2023-08-16 NOTE — Telephone Encounter (Signed)
===  View-only below this line=== ----- Message ----- From: Heilingoetter, Calton CROME, PA-C Sent: 08/16/2023   3:34 PM EDT To: Grayce CHRISTELLA Loge, CMA; Kavitha Nandigam V, * Subject: RE: Endoscopic Evaluation - New-Onset IDA in*  Thank you both!

## 2023-08-18 ENCOUNTER — Encounter: Payer: Self-pay | Admitting: Physician Assistant

## 2023-08-19 ENCOUNTER — Encounter: Admitting: Rehabilitative and Restorative Service Providers"

## 2023-08-19 LAB — CORTISOL, URINE, FREE
Cortisol (Ur), Free: 24 ug/(24.h) (ref 6–42)
Cortisol,F,ug/L,U: 24 ug/L

## 2023-08-26 NOTE — Therapy (Signed)
 OUTPATIENT OCCUPATIONAL THERAPY TREATMENT & PROGRESS NOTE  Patient Name: Charlene [name] MRN: [id number] DOB:[birth year], 43 y.o., female Today's Date: 08/27/2023  PCP: Mahlon BEAL MD REFERRING PROVIDER:  Joane Artist RAMAN, MD            Progress Note Reporting Period 07/10/23 to 08/27/23 CLINICAL IMPRESSION: 08/27/23: She has had a strange course of therapy-initially coming in for lateral epicondylitis, missing several sessions and not coming in consistently, then returning with new reports of right neck and shoulder pain, hand pain and achiness through both of her wrists.  OT did address these things as they are within the scope and practice of a certified hand therapist, and she was given an excellent program to help manage these things.  In therapy, modalities, manual therapy and stretches and exercises all have felt healthy and pain relieving to her and she has been asked to do these things for management at home.  Each time that she returns, she speaks very negatively stating that nothing seems to help and that she feels just as bad as before.  In fact, today she states feeling worse than the start of her care.  She seems focused on her deficits and generally negative attitude.  She also seems to have a difficulty admitting when she is feeling better or not having pain.  These things can be common with chronic pain syndromes and also mental health syndromes.   Today her progress note actually shows that she is doing better in terms of right hand grip strength, pain at rest, reported functional ability, forearm motion, wrist flexion and arm strength.  She does admit that her right elbow is feeling less painful, though it is still tender with weightbearing activities.  Her left hand does continue to bother her and presents as an arthritis flareup, which she was educated on how to manage.  OT feels like we are at an somewhat of an impasse with therapy, and that she needs to seek medical advice from  other types of medical professionals-perhaps adjusting medications for psoriatic arthritis or seeking steroid injection for lingering tendinitis through the left arm to the elbow and wrist.  We discussed some of these other options, and she was recommended to continue doing modalities, massages, stretches as these things have always been relieving for her during therapy sessions.  OT did emphasize that she needs to manage her chronic diseases and problems the best she can and not to expect any perfect outcomes.  She was encouraged not to focus on the negatives and to encouraged to recognize when she is feeling better, to help take control of her health management.  OT decides to put any further therapy on hold until she can see other medical practitioners to help her manage her symptoms and complaints.  If after that, she and her physicians think that therapy is still appropriate, we can continue to help with management of her symptoms as helpful.  She would need a new progress note at that point.    PLAN:  OT FREQUENCY: 1x/week  OT DURATION: 1 additional week from 08/23/23 -08/27/23 and1 visit to cover today's progress note.  (Additional shara will be requested in the future if the patient returns to therapy after following up with the doctor.  Additional therapy will be put on hold until she can speak to other medical practitioners.)    See note below for Objective Data and Assessment of Progress/Goals.  END OF SESSION:  OT End of Session - 08/27/23 1436     Visit Number 6    Number of Visits 12    Date for OT Re-Evaluation 08/23/23    Authorization Type [employer]    OT Start Time 1436    OT Stop Time 1523    OT Time Calculation (min) 47 min    Activity Tolerance Patient tolerated treatment well;No increased pain;Patient limited by fatigue;Patient limited by pain    Behavior During Therapy [facility] for tasks assessed/performed          Past Medical History:   Diagnosis Date   Alcoholism (HCC)    Anxiety    Anxiety and depression    Bipolar 2 disorder (HCC)    Borderline personality disorder (HCC)    Chest pain    Chronic fatigue syndrome    Claustrophobia    on meds   Constipation    Depression    on meds   Elevated blood sugar 06/08/2014   Elevated LFTs    Fatigue    Fatty liver 10/2014   GERD (gastroesophageal reflux disease)    diet related   Heart murmur    Hyperlipidemia    on meds   Infertility associated with anovulation    Joint pain    Low grade squamous intraepithelial lesion (LGSIL) on Pap smear 10/30/2011   Major depressive disorder    Migraines    MS (multiple sclerosis) (HCC)    Neuromuscular disorder (HCC)    Obesity    Obesity in pregnancy, antepartum    PCOS (polycystic ovarian syndrome)    Personal history of pre-term labor    Prediabetes    Pregnancy induced hypertension    pregnancy related- hx of   Psoriasis    Sleep apnea    SOB (shortness of breath)    Vitamin D  deficiency    Past Surgical History:  Procedure Laterality Date   COLONOSCOPY  10/17/2020   DILATION AND CURETTAGE OF UTERUS  08/15/2008   DILATION AND EVACUATION  12/29/2011   Procedure: DILATATION AND EVACUATION;  Surgeon: Winton Felt, MD;  Location: [facility];  Service: Gynecology;;  Dr, Edsel transferred case to Dr. 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   Iron   deficiency anemia 02/15/2022   Notalgia 01/25/2022   Stress 01/25/2022   Insulin  resistance 11/29/2021   Elevated LFTs 11/08/2021   Pre-hypertension 10/17/2021   Vitamin D  deficiency 10/17/2021   SOBOE (shortness of breath on exertion) 10/17/2021   Other hyperlipidemia 08/09/2021   Rosacea 09/15/2020   Physical exam 06/16/2020   Alcohol abuse 05/01/2018   Bipolar disorder (HCC) 06/08/2016   Borderline personality disorder (HCC) 06/08/2016   Obesity (BMI 30-39.9) 12/07/2014   Hyperlipemia 06/08/2014   Elevated blood sugar 06/08/2014   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   Migraines 06/08/2014   Multiple sclerosis (HCC)    History of abnormal cervical Pap smear 10/30/2011   Adjustment disorder with anxiety 01/05/2008   Obesity, unspecified 11/26/2007  ONSET DATE: 3-4 months onset   REFERRING DIAG: M25.521 (ICD-10-CM) - Right elbow pain   THERAPY DIAG:  Pain in right elbow - Plan: Ot plan of care cert/re-cert  Muscle weakness (generalized) - Plan: Ot plan of care cert/re-cert  Pain in right wrist - Plan: Ot plan of care cert/re-cert  Rationale for Evaluation and Treatment: Rehabilitation  PERTINENT HISTORY: Rt Lat epi, was on MD program  She states states insidious onset 3-4 months ago, thought it was psoriatic arthritis, She was put on meds for that, but months later it didn't help, and Dr. Joane dx her with Rt lat epi.   She tried MD HEP, but did not work for her independently. At night ain is the worst.   PRECAUTIONS: Other: chronic MS (heat sensitive), psoriatic arthritis, and other conditions   RED FLAGS: None   WEIGHT BEARING RESTRICTIONS: No    SUBJECTIVE:   SUBJECTIVE STATEMENT: She states returning from vacation, having some family drama and mental stress there..  She describes her current symptoms in an obtuse manner stating it hurts non specifically..  Referring to her wrists in the hands and arms, also her neck and various other areas in her body.  This may  be due to arthritis flareup.  She does state that her elbow feels better now.     PAIN:  Are you having pain? Yes: NPRS scale: 0/10 at rest now; complains of 1-3/10 pain in past week in hands that make her miserable  Pain location: Rt lateral/dorsal forearm and wrist areas  Pain description: aching and sometimes sharp Aggravating factors: reaching out elbow and grabbing objects Relieving factors: rest   PATIENT GOALS: To improve pain and symptoms in the right dominant arm  NEXT MD VISIT: As needed    OBJECTIVE: (All objective assessments below are from initial evaluation on: 07/10/23 unless otherwise specified.)   HAND DOMINANCE: Right   ADLs: Overall ADLs: States decreased ability to grab, hold household objects, pain and difficulty to open containers, perform FMS tasks (manipulate fasteners on clothing), mild to moderate bathing problems as well.    FUNCTIONAL OUTCOME MEASURES: 08/27/23: PSFS: 5   Eval: Patient Specific Functional Scale: 2.3 (sleeping, lifting household objects, leaning on elbow/wrist/fa)  (Higher Score  =  Better Ability for the Selected Tasks)      UPPER EXTREMITY ROM     Shoulder to Wrist AROM Right eval Rt 08/12/23 Rt 08/27/23  Elbow flexion 155  WNL  Elbow extension 0  WNL  Forearm supination 75  80  Forearm pronation  85  90  Wrist flexion 70  (80 in Lt) 80 79  Wrist extension 72 (70 in Lt)  58 58  (Blank rows = not tested)   Hand AROM Right eval  Full Fist Ability (or Gap to Distal Palmar Crease) full  Thumb Opposition  (Kapandji Scale)  WNL  (Blank rows = not tested)   UPPER EXTREMITY MMT:      MMT Right 07/10/2023 Rt 08/27/23  Forearm supination 4+/5 4+/5  Forearm pronation 5/5 4+/5  Wrist flexion 5/5 MMT 5/5  Wrist extension 4/5 MMT tender 4/5 tender  Wrist ulnar deviation 5/5 5/5  Wrist radial deviation    (Blank rows = not tested)  HAND FUNCTION: 08/27/23: Lt grip :42#; Lt:44#    Eval: Observed weakness in affected Rt  hand.  Grip strength Right: 41 tender lbs, Left: 64 lbs    OBSERVATIONS:   08/27/23: She admits to having no resting pain today though her  neck is mildly irritating.  This is an improvement, and she does not appear to be in any pain-is not pain posturing, not heavy breathing, not showing outward signs or symptoms of pain.  She is calm and well mannered throughout the session.  She does speak very negatively and tend to focus on the times that she does have pain.  Her elbow is not tender to palpation, though ECRB at the wrist does seem tender especially with wrist extension still.  She is told to monitor this and manage it with the routines that she was educated on.  08/12/23: Right lateral epicondyle is less tender and less symptomatic now, she seems to have more widespread issues recently likely related to psoriatic arthritis and what appears to be a systemic flare.  She is dabbling with different medications for this that hopefully will be helpful as well.  Eval:  she states bicep is someehat sore, ulnar side of wrist is sore, lateral elbow (worst) can be sore and it jumps around  Presents as Right lateral epicondylitis   TODAY'S TREATMENT:  08/27/23: Pt performs AROM, gripping, and strength with right wrist/arm against therapist's resistance for exercise/activities as well as new measures today.  Largely her right arm and hand are doing better than the start of care, less painful, less symptomatic, stronger, better motion, etc.  Wrist extension is a bit stiff and she was cautioned to continue prior stretches and wrist flexion stretches.  OT also discusses home and functional tasks with the pt, with which she rates doing better, which does seem to surprise her, after which she suggests to retake the test to lower her own score which seems odd.  OT advises that she take the win and focus on the positives in life.  Her left hand does have some new weakness in the higher reported pain which is  likely arthritis flareup for which she needs to speak to her rheumatologist and perhaps change medications.  She does have an excellent stretch routine for hand arthritis which has historically helped her during her sessions.  She needs to keep managing these things, though it seems to overwhelm her to think about it that way.  OT again discusses that there is no perfect or ever lasting fix to pain stiffness and problems.  Living a lifetime takes a lifetime of management.  Her goals were reviewed with her, her exercise program was updated to include light eccentric strengthening for the right ECRB which she tolerated well in the session today.  She was advised to continue on her management with HEP, add light strengthening if the elbow and wrist are not painful, continue conservative management of hand arthritis with things like bracing and stretching and modalities.  She was also told that she should follow-up with her other medical providers as OT feels that treatment is somewhat limited from occupational therapy standpoint now.  OT will not cancel out her plan of care or discharge her until after she has spoken to other professionals.  OT will leave this plan of care open for 4 to 6 weeks to allow her to come back with any problems in the future, or any review of management techniques.    She states understanding at the end of the session and leaves with no significant increase in pain after performing new wrist eccentric's.     Exercises reviewed/performed today (bolded are new) - Standing neck/upper traps stretch  - 2-3 x daily - 3 reps - 15 sec hold - Tricep  Stretch- DO SEATED BY TABLE  - 3-4 x daily - 3-5 reps - 15 hold - Fridge Door Stretch  - 4 x daily - 3-5 reps - 15 sec hold - Wrist Prayer Stretch  - 4 x daily - 3-5 reps - 15 sec hold - Wrist Flexion Stretch  - 3-4 x daily - 3-5 reps - 15 sec hold - Seated Scapular Retraction  - 3-4 x daily - 5-10 reps - Standing Radial Nerve Glide  - 3-4  x daily - 1 sets - 5-10 reps - Seated Elbow Manual Massage Clockwise  - 3-4 x daily - 2-3 mins hold - PUSH KNUCKLES DOWN  - 4 x daily - 3-5 reps - 15 seconds hold - HOOK Stretch  - 4 x daily - 3-5 reps - 15-20 sec hold - BACK KNUCKLE STRETCHES   - 4 x daily - 3-5 reps - 15 sec hold - Seated Finger Composite Flexion Stretch  - 4 x daily - 3-5 reps - 15 hold - Towel Roll Grip with Forearm in Neutral  - 3 x daily - 5 reps - 10 sec hold - Seated Eccentric Wrist Extension  - 4-6 x daily - 1 sets - 10-15 reps   PATIENT EDUCATION: Education details: See tx section above for details  Person educated: Patient Education method: Verbal Instruction, Teach back, Handouts  Education comprehension: States and demonstrates understanding, Additional Education may be required    HOME EXERCISE PROGRAM: Access Code: [email or web address] URL: [email or web address] Date: 07/10/2023 Prepared by: Melvenia Ada   GOALS: Goals reviewed with patient? Yes   SHORT TERM GOALS: (STG required if POC>30 days) Target Date: 07/26/23  Pt will obtain protective, custom orthotic. Goal status: 08/27/23: N/A/discharge  2.  Pt will demo/state understanding of initial HEP to improve pain levels and prerequisite motion. Goal status: 07/25/23: Goal met   LONG TERM GOALS: Target Date: 08/23/23  Pt will improve functional ability by decreased impairment per PSFS assessment from 2.3 to 6.5 or better, for better quality of life. Goal status: 08/27/23: Improved to 5 now  2.  Pt will improve grip strength in Rt hand from tender 41lbs to at least 55lbs for functional use at home and in IADLs. Goal status: 08/27/23: Improved to 42 pounds now (left hand significantly decreased due to recent exacerbations of pain)  3.  Pt will improve A/ROM in right wrist flexion from 70 degrees to at least 75 degrees, to have functional motion for tasks like reach and grasp.  Goal status: 08/27/23: Goal met  4.  Pt will improve strength in  right wrist extension from tender 4/5 MMT to at least 4+/5 MMT to have increased functional ability to carry out selfcare and higher-level homecare tasks with less difficulty. Goal status: 08/27/23: Goal not met  5.  Pt will decrease pain at worst from 10/10 to 3/10 or better to have better sleep and occupational participation in daily roles. Goal status: 08/27/23: Goal met-no rated elbow pain today, but does rate pain in her hands 3/10 at worst in the past week    ASSESSMENT:  CLINICAL IMPRESSION: 08/27/23: She has had a strange course of therapy-initially coming in for lateral epicondylitis, missing several sessions and not coming in consistently, then returning with new reports of right neck and shoulder pain, hand pain and achiness through both of her wrists.  OT did address these things as they are within the scope and practice of a certified hand therapist, and she was given an excellent program to help  manage these things.  In therapy, modalities, manual therapy and stretches and exercises all have felt healthy and pain relieving to her and she has been asked to do these things for management at home.  Each time that she returns, she speaks very negatively stating that nothing seems to help and that she feels just as bad as before.  In fact, today she states feeling worse than the start of her care.  She seems focused on her deficits and generally negative attitude.  She also seems to have a difficulty admitting when she is feeling better or not having pain.  These things can be common with chronic pain syndromes and also mental health syndromes.   Today her progress note actually shows that she is doing better in terms of right hand grip strength, pain at rest, reported functional ability, forearm motion, wrist flexion and arm strength.  She has met 2 out of 5 long-term goals and improved in at least 2 other goal categories.  She does admit that her right elbow is feeling less painful, though it is  still tender with weightbearing activities.  Her left hand does continue to bother her and presents as an arthritis flare-up, which she was educated on how to manage.  OT feels like we are at an somewhat of an impasse with therapy, and that she needs to seek medical advice from other types of medical professionals-perhaps adjusting medications for psoriatic arthritis or seeking steroid injection for lingering tendinitis through the left arm to the elbow and wrist.  We discussed some of these other options, and she was recommended to continue doing modalities, massages, stretches as these things have always been relieving for her during therapy sessions.  OT did emphasize that she needs to manage her chronic diseases and problems the best she can and not to expect any perfect outcomes.  She was encouraged not to focus on the negatives and to encouraged to recognize when she is feeling better, to help take control of her health management.  OT decides to put any further therapy on hold until she can see other medical practitioners to help her manage her symptoms and complaints.  If after that, she and her physicians think that therapy is still appropriate, we can continue to help with management of her symptoms as helpful.  She would need a new progress note at that point.    PLAN:  OT FREQUENCY: 1x/week  OT DURATION: 1 additional week from 08/23/23 -08/27/23 and1 visit to cover today's progress note.  (Additional shara will be requested in the future if the patient returns to therapy after following up with the doctor.  Additional therapy will be put on hold until she can speak to other medical practitioners.)  PLANNED INTERVENTIONS: 97535 self care/ADL training, 02889 therapeutic exercise, 97530 therapeutic activity, 97112 neuromuscular re-education, 97140 manual therapy, 97035 ultrasound, 97032 electrical stimulation (manual), 97760 Orthotic Initial, [id number] Orthotic/Prosthetic subsequent, compression  bandaging, Dry needling, energy conservation, coping strategies training, and patient/family education  RECOMMENDED OTHER SERVICES: Recommended to follow back up with rheumatologist, other types of medical professionals including sports med, mental health, homeopathic, etc. to help manage symptoms  CONSULTED AND AGREED WITH PLAN OF CARE: Patient  PLAN FOR NEXT SESSION:   Therapy on hold for now-can see back in the next 4 to 6 weeks for progress note to resume therapy after she is seen by other medical professionals.  If not heard or seen from in 6 weeks, this plan of care can be  discharged   [facility], OTR/L, CHT  08/27/2023, 7:14 PM

## 2023-08-27 ENCOUNTER — Ambulatory Visit: Admitting: Rehabilitative and Restorative Service Providers"

## 2023-08-27 ENCOUNTER — Encounter: Payer: Self-pay | Admitting: Rehabilitative and Restorative Service Providers"

## 2023-08-27 DIAGNOSIS — M25531 Pain in right wrist: Secondary | ICD-10-CM | POA: Diagnosis not present

## 2023-08-27 DIAGNOSIS — M25521 Pain in right elbow: Secondary | ICD-10-CM

## 2023-08-27 DIAGNOSIS — M6281 Muscle weakness (generalized): Secondary | ICD-10-CM

## 2023-08-28 ENCOUNTER — Other Ambulatory Visit: Payer: Self-pay

## 2023-08-28 ENCOUNTER — Other Ambulatory Visit: Payer: Self-pay | Admitting: *Deleted

## 2023-08-28 DIAGNOSIS — D649 Anemia, unspecified: Secondary | ICD-10-CM

## 2023-08-28 DIAGNOSIS — Z8719 Personal history of other diseases of the digestive system: Secondary | ICD-10-CM | POA: Insufficient documentation

## 2023-08-28 DIAGNOSIS — K649 Unspecified hemorrhoids: Secondary | ICD-10-CM | POA: Insufficient documentation

## 2023-08-28 DIAGNOSIS — Z79899 Other long term (current) drug therapy: Secondary | ICD-10-CM | POA: Diagnosis not present

## 2023-08-28 DIAGNOSIS — D500 Iron deficiency anemia secondary to blood loss (chronic): Secondary | ICD-10-CM | POA: Diagnosis present

## 2023-08-28 LAB — OCCULT BLOOD X 1 CARD TO LAB, STOOL
Fecal Occult Bld: NEGATIVE
Fecal Occult Bld: NEGATIVE
Fecal Occult Bld: NEGATIVE

## 2023-08-29 ENCOUNTER — Ambulatory Visit: Admitting: Gastroenterology

## 2023-09-02 ENCOUNTER — Ambulatory Visit (INDEPENDENT_AMBULATORY_CARE_PROVIDER_SITE_OTHER): Admitting: Family Medicine

## 2023-09-03 NOTE — Progress Notes (Unsigned)
 [facility] OFFICE PROGRESS NOTE  [name], MD [phone] [address] [place] [address]  DIAGNOSIS: Iron  deficiency anemia secondary to history of gastric ulcer and hemorrhoids   PRIOR THERAPY:  Iron  infusion with Venofer  300 Mg IV weekly for 3 weeks most recent dose on 09/11/23   CURRENT THERAPY: Over-the-counter ferrous sulfate  with vitamin C vs multivitamin   INTERVAL HISTORY: Charlene [name] 43 y.o. female returns to the clinic today for a follow-up visit.  The patient is seen for iron  deficiency anemia. She is here for a short interval follow up.   The patient was seen here last month on 08/14/23. The patient is followed for IDA secondary initially for hemorrhoids which had been banded. Her Hbg had normalized until recently.   Recently, she has had abrupt drop in her Hbg which was confirmed by our office as well as labs performed at her dermatologists office. She struggles with chronic pain and has been taking high doses of NSAIDS (800 mg TID) for several months. She also uses aspirin containing powders. Therefore, at her last appointment, concern for stomach ulcers and GI blood loss, especially given history of gastritis from endoscopy in May 2024.   She was given stool cards but due to not being able to stop taking NSAIDs to complete the stool cards and having frequent constipation, she was not able to complete them. Those were negative for blood. Regardless, she did see GI for consideration of endoscopic evaluation today and they are planning endoscopy on 10/25/23. She is on protonix .    Her last set of iron  studies actually showed elevaed ferritin which may be related to inflammation, especially because she has psoriatic arthriris and has been experiencing flare up of pain. She also has MS and was supposed to start Acthar but there was issues related to insurance.   She was taking iron  supplemens every few days with vitamin C but is not consistent due to the  constipation. She takes a multivitamin with iron . However, she stopped this after her ferritin and iron  was off at her last appointment.  She lives in chronic pain. She does not want to be on narcotic pain medications. No significant abdominal pain or visible blood. She has been trying not to take as much NSAIDS recently. She had been on steroids in the past but not recently.      She is here for evaluation and repeat blood work.     MEDICAL HISTORY: Past Medical History:  Diagnosis Date   Alcoholism (HCC)    Anxiety    Anxiety and depression    Bipolar 2 disorder (HCC)    Borderline personality disorder (HCC)    Chest pain    Chronic fatigue syndrome    Claustrophobia    on meds   Constipation    Depression    on meds   Elevated blood sugar 06/08/2014   Elevated LFTs    Fatigue    Fatty liver 10/2014   GERD (gastroesophageal reflux disease)    diet related   Heart murmur    Hyperlipidemia    on meds   Infertility associated with anovulation    Joint pain    Low grade squamous intraepithelial lesion (LGSIL) on Pap smear 10/30/2011   Major depressive disorder    Migraines    MS (multiple sclerosis) (HCC)    Neuromuscular disorder (HCC)    Obesity    Obesity in pregnancy, antepartum    PCOS (polycystic ovarian syndrome)  Personal history of pre-term labor    Prediabetes    Pregnancy induced hypertension    pregnancy related- hx of   Psoriasis    Sleep apnea    SOB (shortness of breath)    Vitamin D  deficiency     ALLERGIES:  is allergic to ciprofloxacin, african mango [irvingia gabonensis], atorvastatin , and rosuvastatin .  MEDICATIONS:  Current Outpatient Medications  Medication Sig Dispense Refill   ALPRAZolam  (XANAX ) 1 MG tablet Take 1-2 pills about 30 min prior to the MRI. Can take additional tablet at the time of test if needed.Can cause drowsiness. Must have driver to and from the test 2 tablet 0   clobetasol cream (TEMOVATE) 0.05 % Apply 1 Application  topically 2 (two) times daily.     COSENTYX UNOREADY 300 MG/2ML SOAJ 300 mg.     gabapentin (NEURONTIN) 100 MG capsule Take 100 mg by mouth 2 (two) times daily as needed.     lamoTRIgine (LAMICTAL) 200 MG tablet Take 200 mg by mouth daily.     Multiple Vitamin (MULTIVITAMIN) tablet Take 1 tablet by mouth daily.     ondansetron  (ZOFRAN ) 4 MG tablet Take 1 tablet (4 mg total) by mouth every 6 (six) hours as needed for nausea or vomiting. 20 tablet 0   pantoprazole  (PROTONIX ) 40 MG tablet TAKE 1 TABLET(40 MG) BY MOUTH DAILY 90 tablet 0   Sulfacetamide Sodium-Sulfur 10-2 % LIQD Apply topically as needed.     tirzepatide  (ZEPBOUND ) 12.5 MG/0.5ML Pen Inject 12.5 mg into the skin once a week. 2 mL 1   Vitamin D , Ergocalciferol , (DRISDOL ) 1.25 MG (50000 UNIT) CAPS capsule Take 1 capsule (50,000 Units total) by mouth every 7 (seven) days. 12 capsule 3   VTAMA 1 % CREA Apply 1 Application topically daily.     VYVANSE 60 MG capsule Take 60 mg by mouth every morning.     zolmitriptan  (ZOMIG ) 5 MG tablet TAKE ONE TABLET BY MOUTH AS NEEDED FOR MIGRAINES. MAY REPEAT IN TWO HOURS IF NEEDED. MAX 2 TABLETS A DAY OR 4 TABLETS A WEEK 10 tablet 6   No current facility-administered medications for this visit.    SURGICAL HISTORY:  Past Surgical History:  Procedure Laterality Date   COLONOSCOPY  10/17/2020   DILATION AND CURETTAGE OF UTERUS  08/15/2008   DILATION AND EVACUATION  12/29/2011   Procedure: DILATATION AND EVACUATION;  Surgeon: Winton Felt, MD;  Location: [facility];  Service: Gynecology;;  Dr, Edsel transferred case to Dr. Felt   LAPAROSCOPIC GASTRIC SLEEVE RESECTION N/A 12/07/2014   Procedure: LAPAROSCOPIC GASTRIC SLEEVE RESECTION;  Surgeon: Alm Angle, MD;  Location: [facility];  Service: General;  Laterality: N/A;    REVIEW OF SYSTEMS:   Review of Systems  Constitutional: Positive for fatigue. Negative for appetite change, chills, fever and unexpected weight change.  HENT: Negative for  mouth sores, nosebleeds, sore throat and trouble swallowing.   Eyes: Negative for eye problems and icterus.  Respiratory: Negative for hemoptysis, shortness of breath and wheezing.   Cardiovascular: Negative for chest pain and leg swelling.  Gastrointestinal: Positive for occasional constipation. Negative for abdominal pain, diarrhea, nausea and vomiting.  Genitourinary: Negative for bladder incontinence, difficulty urinating, dysuria, frequency and hematuria.   Musculoskeletal: Positive for joint pain. Negative for gait problem, neck pain and neck stiffness.  Skin: Negative for itching and rash.  Neurological: Positive for occasional dizziness. Negative for extremity weakness, gait problem, headaches, and seizures.  Hematological: Negative for adenopathy. Does not bruise/bleed easily.  Psychiatric/Behavioral:  Negative for confusion, depression and sleep disturbance. The patient is not nervous/anxious.    PHYSICAL EXAMINATION:  There were no vitals taken for this visit.  ECOG PERFORMANCE STATUS: 1  Physical Exam  Constitutional: Oriented to person, place, and time and well-developed, well-nourished, and in no distress.  HENT:  Head: Normocephalic and atraumatic.  Mouth/Throat: Oropharynx is clear and moist. No oropharyngeal exudate.  Eyes: Conjunctivae are normal. Right eye exhibits no discharge. Left eye exhibits no discharge. No scleral icterus.  Neck: Normal range of motion. Neck supple.  Cardiovascular: Normal rate, regular rhythm, normal heart sounds and intact distal pulses.   Pulmonary/Chest: Effort normal and breath sounds normal. No respiratory distress. No wheezes. No rales.  Abdominal: Soft. Bowel sounds are normal. Exhibits no distension and no mass. There is no tenderness.  Musculoskeletal: Normal range of motion. Exhibits no edema.  Lymphadenopathy:    No cervical adenopathy.  Neurological: Alert and oriented to person, place, and time. Exhibits normal muscle tone. Gait  normal. Coordination normal.  Skin: Skin is warm and dry. No rash noted. Not diaphoretic. No erythema. No pallor.  Psychiatric: Mood, memory and judgment normal.  Vitals reviewed.    LABORATORY DATA: Lab Results  Component Value Date   WBC 4.5 08/14/2023   HGB 9.1 (L) 08/14/2023   HCT 25.9 (L) 08/14/2023   MCV 88.4 08/14/2023   PLT 320 08/14/2023      Chemistry      Component Value Date/Time   NA 139 08/14/2023 0905   NA 142 07/01/2023 0904   K 3.4 (L) 08/14/2023 0905   CL 107 08/14/2023 0905   CO2 29 08/14/2023 0905   BUN 13 08/14/2023 0905   BUN 11 07/01/2023 0904   CREATININE 0.54 08/14/2023 0905   CREATININE 0.43 (L) 12/31/2013 1542      Component Value Date/Time   CALCIUM  8.5 (L) 08/14/2023 0905   ALKPHOS 58 08/14/2023 0905   AST 20 08/14/2023 0905   ALT 52 (H) 08/14/2023 0905   BILITOT 0.4 08/14/2023 0905       RADIOGRAPHIC STUDIES:  No results found.   ASSESSMENT/PLAN:  This is a very pleasant 43 year old Caucasian female with iron  deficiency anemia secondary to GI blood loss.   The patient receives IV iron  as needed with Venofer  the most recent dose being in August 2024.  She is currently taking a iron  supplement a few times a week with vitamin C. She also takes a multivitamin with iron .    She started having new onset anemia in July 2025.    I am concerned she may have GI blood loss due to recent high dose and prolonged NSAID and aspirin use. Her MS medication also had a disease related precaution listed for GI bleeding. She denies black tarry stool. She saw GI who is planning on EGD on 10/25/23. Her stool cards were negative.   Labs from today show normal Hbg of 12.3. Her iron  studies appear normal too. Previous labs showed inconsistent iron  levels; high ferritin, may be related to inflammation. Anemia possibly influenced by chronic inflammation from psoriatic arthritis and chronic pain. Contribution of inflammation to anemia unclear. Her ferritin for  today is pending.   We will see her in 2 months for close monitoring of her labs.   She will continue with protonix  for now.   Psoriatic arthritis with chronic pain Chronic joint pain managed with steroids. Cosentyx ineffective. Acthar prescription delayed due to insurance issues; may benefit psoriatic arthritis and MS symptoms. - Consult  rheumatologist for insurance letter for Acthar. - Continue current pain management strategies. - Follow up with rheumatologist on November 12.  Gastritis Managed with Protonix . Omeprazole  discontinued after long-term use. Endoscopy scheduled for October 10 to assess stomach status, considering NSAID use.  - Continue Protonix  until endoscopy on October 10. - Evaluate endoscopy results for further management.    The patient was advised to call immediately if she has any concerning symptoms in the interval. The patient voices understanding of current disease status and treatment options and is in agreement with the current care plan. All questions were answered. The patient knows to call the clinic with any problems, questions or concerns. We can certainly see the patient much sooner if necessary   No orders of the defined types were placed in this encounter.  The total time spent in the appointment was 20-29 minutes  Denver Bentson L Kolbee Bogusz, PA-C 09/03/23

## 2023-09-04 ENCOUNTER — Other Ambulatory Visit: Payer: Self-pay | Admitting: Physician Assistant

## 2023-09-04 ENCOUNTER — Telehealth (INDEPENDENT_AMBULATORY_CARE_PROVIDER_SITE_OTHER): Payer: Self-pay | Admitting: *Deleted

## 2023-09-04 ENCOUNTER — Ambulatory Visit (INDEPENDENT_AMBULATORY_CARE_PROVIDER_SITE_OTHER): Admitting: Family Medicine

## 2023-09-04 ENCOUNTER — Encounter (INDEPENDENT_AMBULATORY_CARE_PROVIDER_SITE_OTHER): Payer: Self-pay | Admitting: Family Medicine

## 2023-09-04 VITALS — BP 106/74 | HR 81 | Temp 98.0°F | Ht 65.5 in | Wt 189.0 lb

## 2023-09-04 DIAGNOSIS — E559 Vitamin D deficiency, unspecified: Secondary | ICD-10-CM

## 2023-09-04 DIAGNOSIS — E669 Obesity, unspecified: Secondary | ICD-10-CM

## 2023-09-04 DIAGNOSIS — Z6830 Body mass index (BMI) 30.0-30.9, adult: Secondary | ICD-10-CM | POA: Diagnosis not present

## 2023-09-04 DIAGNOSIS — D649 Anemia, unspecified: Secondary | ICD-10-CM

## 2023-09-04 DIAGNOSIS — R632 Polyphagia: Secondary | ICD-10-CM

## 2023-09-04 DIAGNOSIS — Z6841 Body Mass Index (BMI) 40.0 and over, adult: Secondary | ICD-10-CM

## 2023-09-04 MED ORDER — TIRZEPATIDE-WEIGHT MANAGEMENT 12.5 MG/0.5ML ~~LOC~~ SOAJ: 12.5000 mg | SUBCUTANEOUS | 1 refills | Status: DC

## 2023-09-04 NOTE — Telephone Encounter (Signed)
 Message from Plan Request Reference Number: [id number].  ZEPBOUND  INJ 12.5/0.5 is approved through 09/03/2024.  Your patient may now fill this prescription and it will be covered..  Authorization Expiration Date: September 03, 2024.   Patient notified via Mychart message

## 2023-09-04 NOTE — Telephone Encounter (Signed)
 Charlene [name] (Key: [id number])  Your information has been sent to [facility].

## 2023-09-04 NOTE — Addendum Note (Signed)
 Addended by: LAFE BAKER CROME on: 09/04/2023 05:25 PM   Modules accepted: Orders

## 2023-09-04 NOTE — Progress Notes (Signed)
 Office: [phone]  /  Fax: [phone]  WEIGHT SUMMARY AND BIOMETRICS  Anthropometric Measurements Height: 5' 5.5 (1.664 m) Weight: 189 lb (85.7 kg) BMI (Calculated): 30.96 Weight at Last Visit: 191 lb Weight Lost Since Last Visit: 2 lb Weight Gained Since Last Visit: 0 Starting Weight: 250 lb Total Weight Loss (lbs): 61 lb (27.7 kg) Peak Weight: 260 lb   Body Composition  Body Fat %: 38.3 % Fat Mass (lbs): 72.6 lbs Muscle Mass (lbs): 110.8 lbs Total Body Water  (lbs): 84.2 lbs Visceral Fat Rating : 8   Other Clinical Data Fasting: no Labs: no Today's Visit #: 29 Starting Date: 03/29/21 Comments: cat 2 or 3    Chief Complaint: OBESITY   Discussed the use of AI scribe software for clinical note transcription with the patient, who gave verbal consent to proceed.  History of Present Illness Charlene [name] is a 43 year old female with obesity, vitamin D  deficiency, and polyphagia who presents for follow-up.  She is adhering to a category two eating plan approximately 75% of the time and has increased her physical activity by walking 10,000 steps five times a week, resulting in a two-pound weight loss over the past month. Despite these efforts, she experiences increased hunger and cravings for sweets. She is on a high dose of Zioptan and reports consuming slightly larger portions.  She has a history of psoriatic arthritis, causing constant pain, particularly in her neck, elbows, hands, and wrists. She takes a significant amount of ibuprofen  for pain management. Despite being on Cosentyx for psoriasis, it has not been effective in alleviating her symptoms. She has also tried occupational therapy and sports medicine without success. She has multiple medical appointments, including with a rheumatologist and a gastroenterologist, to address her ongoing health issues.  There has been a significant drop in her hemoglobin from 14.5 to 9.1 over the past six weeks, prompting  further investigation. She experiences constant pain from psoriatic arthritis, affecting her neck, elbows, hands, and wrists, particularly at night and in the morning. She completed a series of stool cards, which returned negative results for blood loss. Recent lab results showed abnormal iron  levels, a drop in hemoglobin, and a decrease in protein levels. She has been referred to a gastroenterologist for further evaluation and is scheduled for repeat labs at hematology.  She experiences increased hunger and 'crazy sweets cravings' and wonders if this could be related to her medication regimen. She is on a high dose of Zepbound  and has been on metformin  in the past due to blood work results. She is concerned about the effectiveness of her current medication as she feels hungrier despite being on a high dose. She also mentioned that her potassium levels have never been low before, and she is experiencing a morning cough that lasts for about an hour after waking up, which was present even when using a CPAP machine for her sleep apnea.  She is experiencing high stress levels, which may be contributing to her increased hunger. Her busy schedule with her children's school activities affects her sleep and stress levels.      PHYSICAL EXAM:  Blood pressure 106/74, pulse 81, temperature 98 F (36.7 C), height 5' 5.5 (1.664 m), weight 189 lb (85.7 kg), SpO2 98%. Body mass index is 30.97 kg/m.  DIAGNOSTIC DATA REVIEWED:  BMET    Component Value Date/Time   NA 139 08/14/2023 0905   NA 142 07/01/2023 0904   K 3.4 (L) 08/14/2023 0905   CL 107  08/14/2023 0905   CO2 29 08/14/2023 0905   GLUCOSE 82 08/14/2023 0905   GLUCOSE 93 11/24/2013 1126   BUN 13 08/14/2023 0905   BUN 11 07/01/2023 0904   CREATININE 0.54 08/14/2023 0905   CREATININE 0.43 (L) 12/31/2013 1542   CALCIUM  8.5 (L) 08/14/2023 0905   GFRNONAA >60 08/14/2023 0905   GFRAA >60 12/02/2014 1120   Lab Results  Component Value Date    HGBA1C 4.9 07/01/2023   HGBA1C 4.9 05/05/2018   Lab Results  Component Value Date   INSULIN  7.2 07/01/2023   INSULIN  15.6 03/29/2021   Lab Results  Component Value Date   TSH 0.463 07/01/2023   CBC    Component Value Date/Time   WBC 4.5 08/14/2023 0905   WBC 6.1 04/25/2022 1206   RBC 2.93 (L) 08/14/2023 0905   HGB 9.1 (L) 08/14/2023 0905   HGB 14.5 07/01/2023 0904   HCT 25.9 (L) 08/14/2023 0905   HCT 43.6 07/01/2023 0904   PLT 320 08/14/2023 0905   PLT 357 07/01/2023 0904   MCV 88.4 08/14/2023 0905   MCV 96 07/01/2023 0904   MCH 31.1 08/14/2023 0905   MCHC 35.1 08/14/2023 0905   RDW 10.6 (L) 08/14/2023 0905   RDW 12.7 07/01/2023 0904   Iron  Studies    Component Value Date/Time   IRON  222 (H) 08/14/2023 0905   IRON  57 07/01/2023 0904   TIBC 231 (L) 08/14/2023 0905   TIBC 271 07/01/2023 0904   FERRITIN 496 (H) 08/14/2023 0905   FERRITIN 36 07/01/2023 0904   IRONPCTSAT 96 (H) 08/14/2023 0905   IRONPCTSAT 21 07/01/2023 0904   IRONPCTSAT 12 (L) 04/25/2022 1206   Lipid Panel     Component Value Date/Time   CHOL 169 07/01/2023 0904   TRIG 89 07/01/2023 0904   HDL 49 07/01/2023 0904   CHOLHDL 3 06/17/2020 1139   VLDL 27.6 06/17/2020 1139   LDLCALC 103 (H) 07/01/2023 0904   LDLDIRECT 236.0 12/22/2019 1131   Hepatic Function Panel     Component Value Date/Time   PROT 6.4 (L) 08/14/2023 0905   PROT 6.8 07/01/2023 0904   ALBUMIN 3.9 08/14/2023 0905   ALBUMIN 4.6 07/01/2023 0904   AST 20 08/14/2023 0905   ALT 52 (H) 08/14/2023 0905   ALKPHOS 58 08/14/2023 0905   BILITOT 0.4 08/14/2023 0905   BILIDIR 0.0 04/25/2022 1206      Component Value Date/Time   TSH 0.463 07/01/2023 0904   Nutritional Lab Results  Component Value Date   VD25OH 69.3 07/01/2023   VD25OH 52.1 09/05/2022   VD25OH 68.7 03/28/2022     Assessment and Plan Assessment & Plan Obesity with polyphagia Obesity with associated polyphagia. Following category two eating plan 75% of the  time. Increased physical activity to 10,000 steps five times a week. Weight loss of two pounds in the last month. Reports increased hunger and sweets cravings. Current medication is Zepbound , with consideration for dose adjustment or addition of metformin  if necessary. Stress and potential medication side effects may contribute to hunger. Hemoglobin drop and abnormal lab results may indicate absorption issues or other underlying conditions which we will factor in to her eating plan if needed once a cause is found. - Continue category two eating plan. - Maintain current physical activity level. - Consider increasing Zepbound  dose if hunger persists. - Consider adding metformin  if Zepbound  dose is maximized. - Monitor stress levels and manage accordingly. - Review lab results with gastroenterology and hematology.  Vitamin D   deficiency Vitamin D  deficiency managed with supplementation. Current levels are well-managed - Continue vitamin D  supplementation as is. No refill needed today    She was informed of the importance of frequent follow up visits to maximize her success with intensive lifestyle modifications for her multiple health conditions.    Louann Penton, MD

## 2023-09-05 ENCOUNTER — Inpatient Hospital Stay (HOSPITAL_BASED_OUTPATIENT_CLINIC_OR_DEPARTMENT_OTHER): Admitting: Physician Assistant

## 2023-09-05 ENCOUNTER — Ambulatory Visit (INDEPENDENT_AMBULATORY_CARE_PROVIDER_SITE_OTHER): Admitting: Gastroenterology

## 2023-09-05 ENCOUNTER — Inpatient Hospital Stay: Attending: Internal Medicine

## 2023-09-05 ENCOUNTER — Encounter: Payer: Self-pay | Admitting: Gastroenterology

## 2023-09-05 VITALS — BP 110/70 | HR 87 | Ht 65.5 in | Wt 193.0 lb

## 2023-09-05 VITALS — BP 130/79 | HR 83 | Temp 98.6°F | Resp 16 | Ht 65.5 in | Wt 193.9 lb

## 2023-09-05 DIAGNOSIS — D509 Iron deficiency anemia, unspecified: Secondary | ICD-10-CM

## 2023-09-05 DIAGNOSIS — D649 Anemia, unspecified: Secondary | ICD-10-CM

## 2023-09-05 DIAGNOSIS — K2100 Gastro-esophageal reflux disease with esophagitis, without bleeding: Secondary | ICD-10-CM | POA: Diagnosis not present

## 2023-09-05 DIAGNOSIS — R748 Abnormal levels of other serum enzymes: Secondary | ICD-10-CM | POA: Diagnosis not present

## 2023-09-05 DIAGNOSIS — D500 Iron deficiency anemia secondary to blood loss (chronic): Secondary | ICD-10-CM | POA: Diagnosis not present

## 2023-09-05 LAB — CBC WITH DIFFERENTIAL (CANCER CENTER ONLY)
Abs Immature Granulocytes: 0.01 K/uL (ref 0.00–0.07)
Basophils Absolute: 0.1 K/uL (ref 0.0–0.1)
Basophils Relative: 2 %
Eosinophils Absolute: 0.2 K/uL (ref 0.0–0.5)
Eosinophils Relative: 6 %
HCT: 36.3 % (ref 36.0–46.0)
Hemoglobin: 12.3 g/dL (ref 12.0–15.0)
Immature Granulocytes: 0 %
Lymphocytes Relative: 24 %
Lymphs Abs: 0.9 K/uL (ref 0.7–4.0)
MCH: 30.8 pg (ref 26.0–34.0)
MCHC: 33.9 g/dL (ref 30.0–36.0)
MCV: 91 fL (ref 80.0–100.0)
Monocytes Absolute: 0.4 K/uL (ref 0.1–1.0)
Monocytes Relative: 12 %
Neutro Abs: 2.1 K/uL (ref 1.7–7.7)
Neutrophils Relative %: 56 %
Platelet Count: 261 K/uL (ref 150–400)
RBC: 3.99 MIL/uL (ref 3.87–5.11)
RDW: 14.8 % (ref 11.5–15.5)
WBC Count: 3.8 K/uL — ABNORMAL LOW (ref 4.0–10.5)
nRBC: 0 % (ref 0.0–0.2)

## 2023-09-05 LAB — CMP (CANCER CENTER ONLY)
ALT: 14 U/L (ref 0–44)
AST: 18 U/L (ref 15–41)
Albumin: 4.7 g/dL (ref 3.5–5.0)
Alkaline Phosphatase: 60 U/L (ref 38–126)
Anion gap: 7 (ref 5–15)
BUN: 10 mg/dL (ref 6–20)
CO2: 26 mmol/L (ref 22–32)
Calcium: 9.1 mg/dL (ref 8.9–10.3)
Chloride: 105 mmol/L (ref 98–111)
Creatinine: 0.55 mg/dL (ref 0.44–1.00)
GFR, Estimated: >60 mL/min (ref >60)
Glucose, Bld: 81 mg/dL (ref 70–99)
Potassium: 3.8 mmol/L (ref 3.5–5.1)
Sodium: 138 mmol/L (ref 135–145)
Total Bilirubin: 0.5 mg/dL (ref 0.0–1.2)
Total Protein: 6.9 g/dL (ref 6.5–8.1)

## 2023-09-05 LAB — IRON AND IRON BINDING CAPACITY (CC-WL,HP ONLY)
Iron: 53 ug/dL (ref 28–170)
Saturation Ratios: 15 % (ref 10.4–31.8)
TIBC: 346 ug/dL (ref 250–450)
UIBC: 293 ug/dL (ref 148–442)

## 2023-09-05 LAB — FERRITIN: Ferritin: 93 ng/mL (ref 11–307)

## 2023-09-05 NOTE — Patient Instructions (Addendum)
 You have been scheduled for an endoscopy. Please follow written instructions given to you at your visit today.  If you use inhalers (even only as needed), please bring them with you on the day of your procedure.  If you take any of the following medications, they will need to be adjusted prior to your procedure:   DO NOT TAKE 7 DAYS PRIOR TO TEST- Trulicity (dulaglutide) Ozempic, Wegovy  (semaglutide ) Mounjaro  (tirzepatide ) Bydureon Bcise (exanatide extended release)  DO NOT TAKE 1 DAY PRIOR TO YOUR TEST Rybelsus (semaglutide ) Adlyxin (lixisenatide) Victoza (liraglutide) Byetta (exanatide) ___________________________________________________________________________   Continue Protonix  40 mg daily.  _______________________________________________________  If your blood pressure at your visit was 140/90 or greater, please contact your primary care physician to follow up on this.  _______________________________________________________  If you are age 43 or older, your body mass index should be between 23-30. Your Body mass index is 31.63 kg/m. If this is out of the aforementioned range listed, please consider follow up with your Primary Care Provider.  If you are age 80 or younger, your body mass index should be between 19-25. Your Body mass index is 31.63 kg/m. If this is out of the aformentioned range listed, please consider follow up with your Primary Care Provider.   ________________________________________________________  The [facility] providers would like to encourage you to use MYCHART to communicate with providers for non-urgent requests or questions.  Due to long hold times on the telephone, sending your provider a message by [facility] may be a faster and more efficient way to get a response.  Please allow 48 business hours for a response.  Please remember that this is for non-urgent requests.  _______________________________________________________  [facility] is using a team-based approach to care.  Your team is made up of your doctor and two to three APPS. Our APPS (Nurse Practitioners and Physician Assistants) work with your physician to ensure care continuity for you. They are fully qualified to address your health concerns and develop a treatment plan. They communicate directly with your gastroenterologist to care for you. Seeing the Advanced Practice Practitioners on your physician's team can help you by facilitating care more promptly, often allowing for earlier appointments, access to diagnostic testing, procedures, and other specialty referrals.

## 2023-09-05 NOTE — Progress Notes (Signed)
 Charlene [name] [id number] Dec 11, 1980   Chief Complaint: Anemia  Referring Provider: [name], MD Primary GI MD: Dr. Shila  HPI: Charlene [name] is a 43 y.o. female with past medical history of anxiety/depression, bipolar 2 disorder, borderline personality disorder, alcoholism, fatty liver, GERD, HLD, migraines, obesity, PCOS, OSA, laparoscopic gastric sleeve resection 2016 who presents today for further evaluation of iron  deficiency anemia.  Oncology reached out to Dr. Shila 08/16/2023 regarding patient's new onset iron  deficiency anemia.  Noted to have had a stable hemoglobin for at least the last year, with significant drop from 14.5-9.1 and over the previous month.  Patient denied any heavy menstrual bleeding, melena, or overt GI symptoms.  Was noted to have psoriatic arthritis and had been taking high-dose NSAIDs along with aspirin containing powders for a prolonged period.  Known history of gastritis and esophagitis per endoscopy performed 05/2022.  PCP started her on Protonix  and has referred her to GI for further evaluation and consideration of repeat endoscopic evaluation to rule out NSAID induced ulcer or other source of occult bleeding.  Last seen in office by Dr. Nandigam 05/16/2022 for complaint of rectal bleeding and iron  deficiency.  Most likely etiology at that time of rectal bleeding was thought to be small-volume hemorrhage from internal hemorrhoids.  She was advised to use Anusol  suppositories at bedtime for 7 days.  EGD was scheduled to evaluate for upper GI source of chronic blood loss. Also noted at that time to have chronic constipation exacerbated by oral iron  supplement.  Was provided samples of Linzess  145 mcg daily.  On EGD there was LA grade C erosive esophagitis without bleeding, a 2 cm hiatal hernia, and gastritis.  She did have hemorrhoid banding 07/04/2022.  Only had 1 banding and symptoms resolved.  Labs 08/14/2023: Hemoglobin 9.1, hematocrit 25.9, MCV  88.4, ALT 52 otherwise normal liver enzymes, iron  222, ferritin 496  FOBT negative x 3 on 08/28/2023.  She has an appointment scheduled with oncology today at 11:30 AM following her appointment with us  and will have labs done prior.   Patient states she has psoriatic arthritis and had been advised to take 800 mg ibuprofen  3 times daily for pain as other medications were not working.  She ended up doing this for 6 months, alternating with Tylenol  as well.  Since drop in hemoglobin has no longer been taking any NSAIDs or Tylenol .  She denies any evidence of GI bleeding including hematemesis, melena, or blood in her stool.  States that after hemorrhoid banding she has not had any problems with rectal bleeding.  Recent Hemoccults were negative.  She has occasional nausea but denies vomiting, acid reflux, heartburn, abdominal pain.  She has a bowel movement every other day with some occasional constipation since starting Zepbound .  Denies diarrhea.  Reports she had some labs done with dermatology prior to labs with oncology.  These were done 08/09/2023 with finding of hemoglobin 10.4, normal MCV, AST 36, ALT 143.  She was taking daily omeprazole , but was just recently switched to Protonix  40 mg daily due to finding of low hemoglobin.  Reports she has been taking OTC iron  intermittently.  Tells me her last iron  infusion was about a year ago.  States that she has a feeling of lightheadedness and dizziness typically when she stands up and this has been ongoing for a couple years, ever since she was diagnosed with iron  deficiency anemia.  She denies any syncopal events.  Previous GI Procedures/Imaging   EGD 05/22/2022 -  LA Grade C erosive esophagitis with no bleeding. Biopsied.  - 2 cm hiatal hernia.  - Gastritis. Biopsied.  - Normal first portion of the duodenum and second portion of the duodenum. Biopsied. Path: 1. Surgical [P], duodenal BENIGN DUODENAL MUCOSA WITH NO DIAGNOSTIC ABNORMALITY 2.  Surgical [P], gastric REACTIVE GASTROPATHY WITH MILD CHRONIC GASTRITIS NEGATIVE FOR H. PYLORI, INTESTINAL METAPLASIA, DYSPLASIA AND CARCINOMA 3. Surgical [P], esophagus MILD ACUTE ESOPHAGITIS CHRONIC GASTRITIS WITH REACTIVE EPITHELIAL CHANGES NEGATIVE FOR INTESTINAL METAPLASIA, DYSPLASIA AND CARCINOMA  Colonoscopy 10-17-20 - Perianal fungal rash found on perianal exam.  - Non-bleeding external and internal hemorrhoids.  - The examination was otherwise normal.  - No specimens collected.   CT Abdomen Pelvis w contrast 04-23-16 -No acute findings in the abdomen or pelvis. -Normal appendix. -Suspect mild fatty infiltration of the liver. -Prior gastric sleeve.   Past Medical History:  Diagnosis Date   Alcoholism (HCC)    Anxiety    Anxiety and depression    Bipolar 2 disorder (HCC)    Borderline personality disorder (HCC)    Chest pain    Chronic fatigue syndrome    Claustrophobia    on meds   Constipation    Depression    on meds   Elevated blood sugar 06/08/2014   Elevated LFTs    Fatigue    Fatty liver 10/2014   GERD (gastroesophageal reflux disease)    diet related   Heart murmur    Hyperlipidemia    on meds   Infertility associated with anovulation    Joint pain    Low grade squamous intraepithelial lesion (LGSIL) on Pap smear 10/30/2011   Major depressive disorder    Migraines    MS (multiple sclerosis) (HCC)    Neuromuscular disorder (HCC)    Obesity    Obesity in pregnancy, antepartum    PCOS (polycystic ovarian syndrome)    Personal history of pre-term labor    Prediabetes    Pregnancy induced hypertension    pregnancy related- hx of   Psoriasis    Sleep apnea    SOB (shortness of breath)    Vitamin D  deficiency     Past Surgical History:  Procedure Laterality Date   COLONOSCOPY  10/17/2020   DILATION AND CURETTAGE OF UTERUS  08/15/2008   DILATION AND EVACUATION  12/29/2011   Procedure: DILATATION AND EVACUATION;  Surgeon: Winton Felt, MD;   Location: [facility];  Service: Gynecology;;  Dr, Edsel transferred case to Dr. Felt   LAPAROSCOPIC GASTRIC SLEEVE RESECTION N/A 12/07/2014   Procedure: LAPAROSCOPIC GASTRIC SLEEVE RESECTION;  Surgeon: Alm Angle, MD;  Location: [facility];  Service: General;  Laterality: N/A;    Current Outpatient Medications  Medication Sig Dispense Refill   COSENTYX UNOREADY 300 MG/2ML SOAJ 300 mg.     gabapentin (NEURONTIN) 100 MG capsule Take 100 mg by mouth 2 (two) times daily as needed.     lamoTRIgine (LAMICTAL) 200 MG tablet Take 200 mg by mouth daily.     Multiple Vitamin (MULTIVITAMIN) tablet Take 1 tablet by mouth daily.     ondansetron  (ZOFRAN ) 4 MG tablet Take 1 tablet (4 mg total) by mouth every 6 (six) hours as needed for nausea or vomiting. 20 tablet 0   pantoprazole  (PROTONIX ) 40 MG tablet TAKE 1 TABLET(40 MG) BY MOUTH DAILY 90 tablet 0   tirzepatide  (ZEPBOUND ) 12.5 MG/0.5ML Pen Inject 12.5 mg into the skin once a week. 2 mL 1   TRINTELLIX 10 MG TABS tablet Take 10 mg  by mouth daily.     Vitamin D , Ergocalciferol , (DRISDOL ) 1.25 MG (50000 UNIT) CAPS capsule Take 1 capsule (50,000 Units total) by mouth every 7 (seven) days. (Patient taking differently: Take 50,000 Units by mouth every 14 (fourteen) days.) 12 capsule 3   VYVANSE 60 MG capsule Take 60 mg by mouth every morning.     zolmitriptan  (ZOMIG ) 5 MG tablet TAKE ONE TABLET BY MOUTH AS NEEDED FOR MIGRAINES. MAY REPEAT IN TWO HOURS IF NEEDED. MAX 2 TABLETS A DAY OR 4 TABLETS A WEEK 10 tablet 6   No current facility-administered medications for this visit.    Allergies as of 09/05/2023 - Review Complete 09/05/2023  Allergen Reaction Noted   Ciprofloxacin Other (See Comments) 02/11/2016   African mango [irvingia gabonensis] Other (See Comments) 03/29/2021   Atorvastatin  Other (See Comments) 06/06/2022   Rosuvastatin  Other (See Comments) 06/06/2022    Family History  Problem Relation Age of Onset   Hypertension Mother     Hyperlipidemia Mother    Mental illness Mother    Dementia Mother        early onset   Depression Mother    Anxiety disorder Mother    Drug abuse Mother    Eating disorder Mother    Alcoholism Father    Heart disease Father    Alcoholism Maternal Grandmother    Mental illness Maternal Grandmother    Esophageal cancer Maternal Grandmother    Heart attack Maternal Grandfather    Colon cancer Neg Hx    Stomach cancer Neg Hx    Colon polyps Neg Hx    Rectal cancer Neg Hx     Social History   Tobacco Use   Smoking status: Former    Current packs/day: 0.00    Average packs/day: 1 pack/day for 15.0 years (15.0 ttl pk-yrs)    Types: Cigarettes    Start date: 07/02/1995    Quit date: 07/02/2010    Years since quitting: 13.1   Smokeless tobacco: Never  Vaping Use   Vaping status: Never Used  Substance Use Topics   Alcohol use: Not Currently    Comment: recovery alcoholic   Drug use: No     Review of Systems:    Constitutional: No fever, chills, weakness  Cardiovascular: No chest pain Respiratory: No SOB  Gastrointestinal: See HPI and otherwise negative Hematologic: No bleeding     Physical Exam:  Vital signs: BP 110/70   Pulse 87   Ht 5' 5.5 (1.664 m)   Wt 193 lb (87.5 kg)   BMI 31.63 kg/m   Constitutional: Pleasant female in NAD, alert and cooperative Head:  Normocephalic and atraumatic.  Eyes: No scleral icterus.  Respiratory: Respirations even and unlabored. Lungs clear to auscultation bilaterally.  No wheezes, crackles, or rhonchi.  Cardiovascular:  Regular rate and rhythm. No murmurs. No peripheral edema. Gastrointestinal:  Soft, nondistended, nontender. No rebound or guarding. Normal bowel sounds. No appreciable masses or hepatomegaly. Rectal:  Not performed.  Neurologic:  Alert and oriented x4;  grossly normal neurologically.  Skin:   Dry and intact without significant lesions or rashes. Psychiatric: Oriented to person, place and time. Demonstrates good  judgement and reason without abnormal affect or behaviors.   RELEVANT LABS AND IMAGING: CBC    Component Value Date/Time   WBC 4.5 08/14/2023 0905   WBC 6.1 04/25/2022 1206   RBC 2.93 (L) 08/14/2023 0905   HGB 9.1 (L) 08/14/2023 0905   HGB 14.5 07/01/2023 0904   HCT 25.9 (L)  08/14/2023 0905   HCT 43.6 07/01/2023 0904   PLT 320 08/14/2023 0905   PLT 357 07/01/2023 0904   MCV 88.4 08/14/2023 0905   MCV 96 07/01/2023 0904   MCH 31.1 08/14/2023 0905   MCHC 35.1 08/14/2023 0905   RDW 10.6 (L) 08/14/2023 0905   RDW 12.7 07/01/2023 0904   LYMPHSABS 0.9 08/14/2023 0905   LYMPHSABS 1.0 07/01/2023 0904   MONOABS 0.8 08/14/2023 0905   EOSABS 0.5 08/14/2023 0905   EOSABS 0.4 07/01/2023 0904   BASOSABS 0.1 08/14/2023 0905   BASOSABS 0.0 07/01/2023 0904    CMP     Component Value Date/Time   NA 139 08/14/2023 0905   NA 142 07/01/2023 0904   K 3.4 (L) 08/14/2023 0905   CL 107 08/14/2023 0905   CO2 29 08/14/2023 0905   GLUCOSE 82 08/14/2023 0905   GLUCOSE 93 11/24/2013 1126   BUN 13 08/14/2023 0905   BUN 11 07/01/2023 0904   CREATININE 0.54 08/14/2023 0905   CREATININE 0.43 (L) 12/31/2013 1542   CALCIUM  8.5 (L) 08/14/2023 0905   PROT 6.4 (L) 08/14/2023 0905   PROT 6.8 07/01/2023 0904   ALBUMIN 3.9 08/14/2023 0905   ALBUMIN 4.6 07/01/2023 0904   AST 20 08/14/2023 0905   ALT 52 (H) 08/14/2023 0905   ALKPHOS 58 08/14/2023 0905   BILITOT 0.4 08/14/2023 0905   GFRNONAA >60 08/14/2023 0905   GFRAA >60 12/02/2014 1120     Assessment/Plan:   Iron  deficiency anemia GERD Elevated liver enzymes Patient seen today for evaluation of iron  deficiency anemia with recent drop in hemoglobin from 14.5-9.1 over the previous month.  Patient denies any evidence of GI bleeding including hematemesis, melena, or rectal bleeding. Also denies any change in bowel habits, abdominal pain, nausea, breakthrough heartburn or acid reflux. She had previously been taking omeprazole  but was just recently  switched to Protonix  40 mg daily. She does have history of LA grade C erosive esophagitis and gastritis on EGD 05/22/2022.  States that she had recently been taking NSAIDs daily for about 6 months for treatment of her psoriatic arthritis pain.  Was also alternating with Tylenol .  She has since discontinued those medications. FOBT negative x 3 on 08/28/2023. She has had elevated liver enzymes recently with labs 08/09/2023 showing AST 36, ALT 143.  On labs 08/14/2023 had ALT 52 and otherwise normal liver enzymes. She has a follow-up with oncology after her appointment with us  today and will be repeating labs at that visit.  Case and plan discussed with Dr. Nandigam in office today.   - Schedule EGD. I thoroughly discussed the procedure with the patient to include nature of the procedure, alternatives, benefits, and risks (including but not limited to bleeding, infection, perforation, anesthesia/cardiac/pulmonary complications). Patient verbalized understanding and gave verbal consent to proceed with procedure.  - Continue Protonix  40 mg daily - Monitor for evidence of GI bleeding - Avoid NSAIDs - Will follow-up on labs  Camie Furbish, PA-C [facility] 09/05/2023, 10:39 AM  Patient Care Team: [name], MD as PCP - General (Family Medicine) Clarice Perkins, MD as Referring Physician (Neurology) Cris Burnard DEL, MD as Consulting Physician (Obstetrics and Gynecology) Reino Niels CROME, MD as Referring Physician (Neurology) Ceasar Shaver, MD as Referring Physician (Dermatology)

## 2023-09-09 ENCOUNTER — Encounter: Admitting: Rehabilitative and Restorative Service Providers"

## 2023-09-11 ENCOUNTER — Encounter: Admitting: Rehabilitative and Restorative Service Providers"

## 2023-09-12 ENCOUNTER — Ambulatory Visit (INDEPENDENT_AMBULATORY_CARE_PROVIDER_SITE_OTHER): Admitting: Neurology

## 2023-09-12 ENCOUNTER — Encounter: Payer: Self-pay | Admitting: Neurology

## 2023-09-12 VITALS — BP 137/86 | HR 81 | Ht 65.5 in | Wt 194.0 lb

## 2023-09-12 DIAGNOSIS — G35D Multiple sclerosis, unspecified: Secondary | ICD-10-CM

## 2023-09-12 DIAGNOSIS — E559 Vitamin D deficiency, unspecified: Secondary | ICD-10-CM | POA: Diagnosis not present

## 2023-09-12 DIAGNOSIS — Z79899 Other long term (current) drug therapy: Secondary | ICD-10-CM | POA: Diagnosis not present

## 2023-09-12 DIAGNOSIS — G4700 Insomnia, unspecified: Secondary | ICD-10-CM

## 2023-09-12 DIAGNOSIS — G35 Multiple sclerosis: Secondary | ICD-10-CM

## 2023-09-12 DIAGNOSIS — D649 Anemia, unspecified: Secondary | ICD-10-CM

## 2023-09-12 DIAGNOSIS — F419 Anxiety disorder, unspecified: Secondary | ICD-10-CM

## 2023-09-12 DIAGNOSIS — G35A Relapsing-remitting multiple sclerosis: Secondary | ICD-10-CM

## 2023-09-12 DIAGNOSIS — R269 Unspecified abnormalities of gait and mobility: Secondary | ICD-10-CM

## 2023-09-12 DIAGNOSIS — L4050 Arthropathic psoriasis, unspecified: Secondary | ICD-10-CM

## 2023-09-12 MED ORDER — TRAZODONE HCL 50 MG PO TABS: 50.0000 mg | ORAL_TABLET | Freq: Every day | ORAL | 5 refills | Status: DC

## 2023-09-12 MED ORDER — PREDNISONE 20 MG PO TABS: ORAL_TABLET | ORAL | 0 refills | Status: DC

## 2023-09-12 NOTE — Progress Notes (Addendum)
 [facility]  PATIENT: Charlene [name] DOB: [birth year]  REFERRING DOCTOR OR PCP: Comer Greet, MD (PCP); Damien Hives MD ([facility]) SOURCE: Patient, notes from Dr. Hives, imaging and lab reports, MRI images personally reviewed.  _________________________________   HISTORICAL  CHIEF COMPLAINT:  Chief Complaint  Patient presents with   Follow-up    Pt in room 10. Alone.Here for MS follow up. DMT: Ocrevus .last infusion 07/23/23. No recent falls, saw eye doctor this year.    HISTORY OF PRESENT ILLNESS:  Charlene [name] is a 43 y.o. woman with multiple sclerosis.  Update 09/12/2023: She is on Ocrevus  since 2017.   She has no exacerbations.  Gait is stable with her balance mildly off and she uses the bannister on stairs and cannot use a ladder.  He feels legs a little weaker and sometimes she feels her leg is going to collapse.    She notes tingling in her wrists, ankles and feet bilaterally.   She also notes mild arm weakness, often worse than her legs.    She has heat intolerance and poor exercise tolerance.      She notes fatigue and sometimes has insomnia most nights.  She notes a mental fog that has not been helped much by Vyvanse.  She has bipolar disease and also diagnosed with PTSD, borderline personality disease and depression.  She sees Darice Albee [facility]) a [facility].    She is on lamotrigine and Trintellix.  She notes mild cognitive issues - following instructions, retaining what she has read, etc.    She has psoriasis and was on Cosentyx.  Initially she did well and it was discontinued after a while.   She developed more psoriatic arthritic symptoms so diagnosis changed.  She had been off Cosentyx but it was restarted recently.  Unfortunately, she does not note a benefit..   The arthritis is worse in her hands..  She sleeps poorly due to the pain.   Two months ago, her Hgb was 14.5 and then 4 weeks ago 9.1 - there was concern she had an anemia  due to NSAIDs and was referred to hematology (had seen in past for anemia and had iron  infusion last year).   Repeat labs looked much better with Hgb 12.1   Hemoccult cards were negative.    She denies MS exacerbations.  She does feel weak, tired and in the mental fog on a daily basis.     She has seen [facility] and has lost 40 pounds and is on Zepbound .    She has OSA (Initial AHI=16) and according to a pulmonology note, the AHI on download with CPAP was 1.6).    She often wakes up woth congestion/cough/   Her CPAP does have a heated humidifier.  She is on Vyvanse for ADD but this has not helped her fatigue/sleepiness much.  She has good bladder function.    She may have IBS.  She sometimes notes a flicker in her left peripheral visual field.   She sees Dr. Meridee for dry eyes so She gets frequent headaches.   ACTHar gel was considered for the dry eyes but not covered by insurance.     MS History  She was diagnosed by Dr. Voncille in 2006 in [facility] after presenting with right optic neuritis.  A few years earlier, she had an MRI showing white matter lesions concerning for MS.  A spinal tap before the ON was non-diagnostic.  She was on Copaxone and then  Betaseron.   She then switched to Tysabri around 2007-2011 and then stopped for childbearing. 2012-2016.   She had some fluctuations of symptoms those years but no definite exaceration.   She was placed on Tecfidera around 2016 but stopped due to low WBC.  She has been on Ocrevus  since 2017.  On it she has had no exacerbations (last infusion was July 2023).     Her mother had dementia and also cerebellar atrophy.      Imaging: MRI of the brain 06/06/2023 showed scattered T2/FLAIR hyperintense foci in the cerebral hemispheres in a pattern consistent with chronic demyelinating plaque associated with multiple sclerosis. None of the foci enhanced or appear to be acute. Compared to the MRI of 05/26/2022, there were no new  lesions.   MRI cervical and thoracic spine 05/26/2022 showed no lesions.    MRI brain 05/26/2022 was unchanged.    MRI of the brain 05/22/2021 shows multiple T2/FLAIR hyperintense foci in the periventricular, juxtacortical and deep white matter of the hemispheres.  None of the foci enhance or appear to be acute.  MRI of the brain 04/16/2020 shows multiple T2/FLAIR hyperintense foci in the periventricular, juxtacortical and deep white matter of the hemispheres.  None of the foci enhance or appear to be acute.  No infratentorial foci noted.     REVIEW OF SYSTEMS: Constitutional: No fevers, chills, sweats, or change in appetite.  Other fatigue Eyes: No visual changes, double vision.  No eye pain but has eye irritation Ear, nose and throat: No hearing loss, ear pain, nasal congestion, sore throat Cardiovascular: No chest pain, palpitations Respiratory:  No shortness of breath at rest or with exertion.   No wheezes GastrointestinaI: No nausea, vomiting, diarrhea, abdominal pain, fecal incontinence Genitourinary:  No dysuria, urinary retention or frequency.  No nocturia. Musculoskeletal:  No neck pain, back pain Integumentary: No rash, pruritus, skin lesions Neurological: as above Psychiatric: She has bipolar disease and depression.   Endocrine: No palpitations, diaphoresis, change in appetite, change in weigh or increased thirst Hematologic/Lymphatic:  No anemia, purpura, petechiae. Allergic/Immunologic: No itchy/runny eyes, nasal congestion, recent allergic reactions, rashes  ALLERGIES: Allergies  Allergen Reactions   Ciprofloxacin Other (See Comments)    Other reaction(s): Mental Status Changes  Manic episode   Manic episode Manic episode Other reaction(s): Mental Status Changes  Manic episode   African Mango [Irvingia Gabonensis] Other (See Comments)   Atorvastatin  Other (See Comments)    Myalgias   Rosuvastatin  Other (See Comments)    Myalgias    HOME MEDICATIONS:  Current  Outpatient Medications:    lamoTRIgine (LAMICTAL) 200 MG tablet, Take 200 mg by mouth daily. (Patient taking differently: Take 300 mg by mouth daily.), Disp: , Rfl:    Multiple Vitamin (MULTIVITAMIN) tablet, Take 1 tablet by mouth daily., Disp: , Rfl:    TRINTELLIX 10 MG TABS tablet, Take 10 mg by mouth daily. (Patient taking differently: Take 5 mg by mouth daily.), Disp: , Rfl:    VYVANSE 60 MG capsule, Take 60 mg by mouth every morning., Disp: , Rfl:    pantoprazole  (PROTONIX ) 40 MG tablet, TAKE 1 TABLET(40 MG) BY MOUTH DAILY, Disp: 90 tablet, Rfl: 0   tirzepatide  (ZEPBOUND ) 12.5 MG/0.5ML Pen, Inject 12.5 mg into the skin once a week., Disp: 2 mL, Rfl: 1   TREMFYA 100 MG/ML prefilled syringe, 1 mL Subcutaneous every 8 weeks; Duration: 60 days, Disp: , Rfl:    Vitamin D , Ergocalciferol , (DRISDOL ) 1.25 MG (50000 UNIT) CAPS capsule, Take 1  capsule (50,000 Units total) by mouth every 14 (fourteen) days., Disp: 6 capsule, Rfl: 0   zolmitriptan  (ZOMIG ) 5 MG tablet, TAKE ONE TABLET BY MOUTH AS NEEDED FOR MIGRAINES. MAY REPEAT IN TWO HOURS IF NEEDED. MAX 2 TABLETS A DAY OR 4 TABLETS A WEEK, Disp: 10 tablet, Rfl: 6  PAST MEDICAL HISTORY: Past Medical History:  Diagnosis Date   Alcoholism (HCC)    Anxiety    Anxiety and depression    Bipolar 2 disorder (HCC)    Borderline personality disorder (HCC)    Chest pain    Chronic fatigue syndrome    Claustrophobia    on meds   Constipation    Depression    on meds   Elevated blood sugar 06/08/2014   Elevated LFTs    Fatigue    Fatty liver 10/2014   GERD (gastroesophageal reflux disease)    diet related   Heart murmur    Hyperlipidemia    on meds   Infertility associated with anovulation    Joint pain    Low grade squamous intraepithelial lesion (LGSIL) on Pap smear 10/30/2011   Major depressive disorder    Migraines    MS (multiple sclerosis)    Neuromuscular disorder (HCC)    Obesity    Obesity in pregnancy, antepartum    PCOS  (polycystic ovarian syndrome)    Personal history of pre-term labor    Prediabetes    Pregnancy induced hypertension    pregnancy related- hx of   Psoriasis    Sleep apnea    SOB (shortness of breath)    Vitamin D  deficiency     PAST SURGICAL HISTORY: Past Surgical History:  Procedure Laterality Date   COLONOSCOPY  10/17/2020   DILATION AND CURETTAGE OF UTERUS  08/15/2008   DILATION AND EVACUATION  12/29/2011   Procedure: DILATATION AND EVACUATION;  Surgeon: Winton Felt, MD;  Location: [facility];  Service: Gynecology;;  Dr, Edsel transferred case to Dr. Felt   LAPAROSCOPIC GASTRIC SLEEVE RESECTION N/A 12/07/2014   Procedure: LAPAROSCOPIC GASTRIC SLEEVE RESECTION;  Surgeon: Alm Angle, MD;  Location: [facility];  Service: General;  Laterality: N/A;    FAMILY HISTORY: Family History  Problem Relation Age of Onset   Hypertension Mother    Hyperlipidemia Mother    Mental illness Mother    Dementia Mother        early onset   Depression Mother    Anxiety disorder Mother    Drug abuse Mother    Eating disorder Mother    Alcoholism Father    Heart disease Father    Alcoholism Maternal Grandmother    Mental illness Maternal Grandmother    Esophageal cancer Maternal Grandmother    Heart attack Maternal Grandfather    Colon cancer Neg Hx    Stomach cancer Neg Hx    Colon polyps Neg Hx    Rectal cancer Neg Hx     SOCIAL HISTORY: Social History   Socioeconomic History   Marital status: Married    Spouse name: Thedora   Number of children: 3   Years of education: 14   Highest education level: Not on file  Occupational History   Occupation: print production planner   Occupation: stay at home mom, part time daycare  Tobacco Use   Smoking status: Former    Current packs/day: 0.00    Average packs/day: 1 pack/day for 15.0 years (15.0 ttl pk-yrs)    Types: Cigarettes    Start date: 07/02/1995  Quit date: 07/02/2010    Years since quitting: 13.4   Smokeless tobacco: Never   Vaping Use   Vaping status: Never Used  Substance and Sexual Activity   Alcohol use: Not Currently    Comment: recovery alcoholic   Drug use: No   Sexual activity: Yes    Partners: Male    Birth control/protection: None    Comment: vasectomy  Other Topics Concern   Not on file  Social History Narrative   Work or School: homemaker      Home Situation: lives with husband, 2 twins and 1 boy      Spiritual Beliefs: none      Lifestyle: no regular exercise, poor diet   Caffeine  use: diet soda daily      Social Drivers of Corporate Investment Banker Strain: Not on file  Food Insecurity: Not on file  Transportation Needs: Not on file  Physical Activity: Not on file  Stress: Not on file  Social Connections: Unknown (04/12/2022)   Received from [facility]   Social Network    Social Network: Not on file  Intimate Partner Violence: Unknown (04/12/2022)   Received from [facility]   HITS    Physically Hurt: Not on file    Insult or Talk Down To: Not on file    Threaten Physical Harm: Not on file    Scream or Curse: Not on file       PHYSICAL EXAM  Vitals:   09/12/23 1559  BP: 137/86  Pulse: 81  Weight: 194 lb (88 kg)  Height: 5' 5.5 (1.664 m)    Body mass index is 31.79 kg/m.   General: The patient is well-developed and well-nourished and in no acute distress  HEENT:  Head is [place]/AT.  Sclera are anicteric.    Skin: Extremities are without rash or  edema.    Neurologic Exam  Mental status: The patient is alert and oriented x 3 at the time of the examination. The patient has apparent normal recent and remote memory, with an apparently normal attention span and concentration ability.   Speech is normal.  Cranial nerves: Extraocular movements are full. Pupils are equal, round, and reactive to light and accomodation.  Color vision was symmetric.  Facial strength is normal .   Sensation is normal.   palate. No obvious hearing deficits are noted.  Motor:   Muscle bulk is normal.   Tone is normal. Strength is  5 / 5 in all 4 extremities.   Sensory: Sensory testing is intact to pinprick, soft touch and vibration sensation in all 4 extremities.  Coordination: Cerebellar testing reveals good finger-nose-finger and heel-to-shin bilaterally.  Gait and station: Station is normal.   Gait is normal.  Tandem gait is mildly wide.  No Romberg sign.  Reflexes: Deep tendon reflexes are symmetric and normal bilaterally.       DIAGNOSTIC DATA (LABS, IMAGING, TESTING) - I reviewed patient records, labs, notes, testing and imaging myself where available.  Lab Results  Component Value Date   WBC 5.6 11/05/2023   HGB 14.4 11/05/2023   HCT 41.3 11/05/2023   MCV 89.8 11/05/2023   PLT 292 11/05/2023      Component Value Date/Time   NA 139 11/05/2023 1116   NA 137 09/13/2023 0812   K 4.6 11/05/2023 1116   CL 105 11/05/2023 1116   CO2 29 11/05/2023 1116   GLUCOSE 84 11/05/2023 1116   GLUCOSE 93 11/24/2013 1126   BUN 14 11/05/2023  1116   BUN 10 09/13/2023 0812   CREATININE 0.84 11/05/2023 1116   CREATININE 0.43 (L) 12/31/2013 1542   CALCIUM  9.9 11/05/2023 1116   PROT 7.0 11/05/2023 1116   PROT 6.5 09/13/2023 0812   ALBUMIN 4.5 11/05/2023 1116   ALBUMIN 4.4 09/13/2023 0812   AST 17 11/05/2023 1116   ALT 14 11/05/2023 1116   ALKPHOS 46 11/05/2023 1116   BILITOT 0.3 11/05/2023 1116   GFRNONAA >60 11/05/2023 1116   GFRAA >60 12/02/2014 1120   Lab Results  Component Value Date   CHOL 276 (H) 11/05/2023   HDL 57 11/05/2023   LDLCALC 160 (H) 11/05/2023   LDLDIRECT 236.0 12/22/2019   TRIG 294 (H) 11/05/2023   CHOLHDL 4.8 11/05/2023   Lab Results  Component Value Date   HGBA1C 4.9 07/01/2023   Lab Results  Component Value Date   VITAMINB12 378 08/14/2023   Lab Results  Component Value Date   TSH 0.463 07/01/2023       ASSESSMENT AND PLAN  Multiple sclerosis, relapsing-remitting  Multiple sclerosis - Plan: IgG, IgA, IgM, CBC  with Differential/Platelet, Comprehensive metabolic panel with GFR  High risk medication use - Plan: IgG, IgA, IgM, CBC with Differential/Platelet, Ferritin, Comprehensive metabolic panel with GFR  Vitamin D  deficiency  Gait disorder  Psoriatic arthritis (HCC)  Anemia, unspecified type - Plan: CBC with Differential/Platelet, Ferritin  Anxiety  Insomnia, unspecified type   Continue Ocrevus .    We will check IgG/IgM, CBC with differential, ferritin and CMP In the past, fatigue has improved for multiple weeks with short course of steroids.  I will send in a 20 mg steroid pack (120 mg on day 1).    Continue CPAP she is on Vyvanse.   Trazodone  for insomnia.   Rtc 6 months or sooner if new or worsening symptoms  40-minute office visit with the majority of the time spent face-to-face for history and physical, discussion/counseling and decision-making.  Additional time with record review and documentation.   Rahshawn Remo A. Vear, MD, [facility] 12/16/2023, 4:59 PM Certified in Neurology, Clinical Neurophysiology, Sleep Medicine and Neuroimaging  [facility] [address] [place], [place] [address] [phone]

## 2023-09-14 LAB — CBC WITH DIFFERENTIAL/PLATELET
Basophils Absolute: 0 x10E3/uL (ref 0.0–0.2)
Basos: 1 %
EOS (ABSOLUTE): 0.1 x10E3/uL (ref 0.0–0.4)
Eos: 3 %
Hematocrit: 36.6 % (ref 34.0–46.6)
Hemoglobin: 12 g/dL (ref 11.1–15.9)
Immature Grans (Abs): 0 x10E3/uL (ref 0.0–0.1)
Immature Granulocytes: 0 %
Lymphocytes Absolute: 1.6 x10E3/uL (ref 0.7–3.1)
Lymphs: 47 %
MCH: 31.7 pg (ref 26.6–33.0)
MCHC: 32.8 g/dL (ref 31.5–35.7)
MCV: 97 fL (ref 79–97)
Monocytes Absolute: 0.5 x10E3/uL (ref 0.1–0.9)
Monocytes: 15 %
Neutrophils Absolute: 1.2 x10E3/uL — ABNORMAL LOW (ref 1.4–7.0)
Neutrophils: 34 %
Platelets: 222 x10E3/uL (ref 150–450)
RBC: 3.78 x10E6/uL (ref 3.77–5.28)
RDW: 14.1 % (ref 11.7–15.4)
WBC: 3.5 x10E3/uL (ref 3.4–10.8)

## 2023-09-14 LAB — COMPREHENSIVE METABOLIC PANEL WITH GFR
ALT: 16 IU/L (ref 0–32)
AST: 25 IU/L (ref 0–40)
Albumin: 4.4 g/dL (ref 3.9–4.9)
Alkaline Phosphatase: 59 IU/L (ref 44–121)
BUN/Creatinine Ratio: 16 (ref 9–23)
BUN: 10 mg/dL (ref 6–24)
Bilirubin Total: 0.4 mg/dL (ref 0.0–1.2)
CO2: 20 mmol/L (ref 20–29)
Calcium: 8.8 mg/dL (ref 8.7–10.2)
Chloride: 102 mmol/L (ref 96–106)
Creatinine, Ser: 0.62 mg/dL (ref 0.57–1.00)
Globulin, Total: 2.1 g/dL (ref 1.5–4.5)
Glucose: 77 mg/dL (ref 70–99)
Potassium: 6 mmol/L — ABNORMAL HIGH (ref 3.5–5.2)
Sodium: 137 mmol/L (ref 134–144)
Total Protein: 6.5 g/dL (ref 6.0–8.5)
eGFR: 113 mL/min/1.73 (ref >59)

## 2023-09-14 LAB — IGG, IGA, IGM
IgA/Immunoglobulin A, Serum: 164 mg/dL (ref 87–352)
IgG (Immunoglobin G), Serum: 897 mg/dL (ref 586–1602)
IgM (Immunoglobulin M), Srm: 26 mg/dL (ref 26–217)

## 2023-09-14 LAB — FERRITIN: Ferritin: 205 ng/mL — ABNORMAL HIGH (ref 15–150)

## 2023-09-16 ENCOUNTER — Ambulatory Visit: Payer: Self-pay | Admitting: Neurology

## 2023-10-01 ENCOUNTER — Other Ambulatory Visit: Payer: Self-pay | Admitting: Neurology

## 2023-10-01 NOTE — Telephone Encounter (Signed)
 Last seen on 09/12/23 Follow up scheduled on 04/23/24   I didn't see in recent note pt was to continue medication.

## 2023-10-02 ENCOUNTER — Encounter (INDEPENDENT_AMBULATORY_CARE_PROVIDER_SITE_OTHER): Payer: Self-pay | Admitting: Family Medicine

## 2023-10-02 ENCOUNTER — Ambulatory Visit (INDEPENDENT_AMBULATORY_CARE_PROVIDER_SITE_OTHER): Admitting: Family Medicine

## 2023-10-02 VITALS — BP 117/82 | HR 86 | Temp 97.7°F | Ht 65.5 in | Wt 185.0 lb

## 2023-10-02 DIAGNOSIS — Z6830 Body mass index (BMI) 30.0-30.9, adult: Secondary | ICD-10-CM

## 2023-10-02 DIAGNOSIS — F5089 Other specified eating disorder: Secondary | ICD-10-CM | POA: Diagnosis not present

## 2023-10-02 DIAGNOSIS — E66813 Obesity, class 3: Secondary | ICD-10-CM | POA: Diagnosis not present

## 2023-10-02 DIAGNOSIS — G8929 Other chronic pain: Secondary | ICD-10-CM

## 2023-10-02 DIAGNOSIS — R632 Polyphagia: Secondary | ICD-10-CM | POA: Diagnosis not present

## 2023-10-02 MED ORDER — TIRZEPATIDE-WEIGHT MANAGEMENT 12.5 MG/0.5ML ~~LOC~~ SOAJ: 12.5000 mg | SUBCUTANEOUS | 1 refills | Status: DC

## 2023-10-02 NOTE — Progress Notes (Signed)
 Office: [phone]  /  Fax: [phone]  WEIGHT SUMMARY AND BIOMETRICS  Anthropometric Measurements Height: 5' 5.5 (1.664 m) Weight: 185 lb (83.9 kg) BMI (Calculated): 30.31 Weight at Last Visit: 189 lb Weight Lost Since Last Visit: 4 lb Weight Gained Since Last Visit: 0 Starting Weight: 250 lb Total Weight Loss (lbs): 65 lb (29.5 kg) Peak Weight: 260 lb   Body Composition  Body Fat %: 37.1 % Fat Mass (lbs): 68.6 lbs Muscle Mass (lbs): 110.6 lbs Total Body Water  (lbs): 80.6 lbs Visceral Fat Rating : 8   Other Clinical Data Fasting: no Labs: no Today's Visit #: 30 Starting Date: 03/29/21    Chief Complaint: OBESITY    History of Present Illness Charlene [name] is a 43 year old female who presents for obesity treatment plan assessment and progress evaluation.  She has been following the category two eating plan approximately seventy percent of the time. She struggles with meeting her protein intake goals but has improved her water  consumption and fruit intake. She sometimes skips meals and grazes instead. Over the past month, she has lost four pounds.  She has a history of psoriatic arthritis, which has been causing significant pain. She recently completed a six-day steroid taper, which temporarily alleviated her symptoms. However, after discontinuing the steroids, her symptoms began to return. She is currently on a five milligram dose of steroids while waiting for Tremfya to take effect, as Cosentyx did not provide relief. She has experienced excruciating pain and has been taking excessive ibuprofen . Previous attempts to use Acthar were denied by insurance, and she is in the process of appealing.  She reports being constantly active, moving from early morning until late at night, which she attributes to her weight loss despite being on steroids. She has a history of bipolar disorder, which she mentions in relation to her activity levels while on steroids.  She has  experienced sweet cravings and has noted higher glucose levels in past blood work, which she attributes to steroid use. Her ferritin levels were previously elevated, possibly due to inflammation, and she has a history of anemia. The arthritis pain has significantly impacted her sleep, causing excruciating discomfort at times.      PHYSICAL EXAM:  Blood pressure 117/82, pulse 86, temperature 97.7 F (36.5 C), height 5' 5.5 (1.664 m), weight 185 lb (83.9 kg), SpO2 99%. Body mass index is 30.32 kg/m.  DIAGNOSTIC DATA REVIEWED:  BMET    Component Value Date/Time   NA 137 09/13/2023 0812   K 6.0 (H) 09/13/2023 0812   CL 102 09/13/2023 0812   CO2 20 09/13/2023 0812   GLUCOSE 77 09/13/2023 0812   GLUCOSE 81 09/05/2023 1109   GLUCOSE 93 11/24/2013 1126   BUN 10 09/13/2023 0812   CREATININE 0.62 09/13/2023 0812   CREATININE 0.55 09/05/2023 1109   CREATININE 0.43 (L) 12/31/2013 1542   CALCIUM  8.8 09/13/2023 0812   GFRNONAA >60 09/05/2023 1109   GFRAA >60 12/02/2014 1120   Lab Results  Component Value Date   HGBA1C 4.9 07/01/2023   HGBA1C 4.9 05/05/2018   Lab Results  Component Value Date   INSULIN  7.2 07/01/2023   INSULIN  15.6 03/29/2021   Lab Results  Component Value Date   TSH 0.463 07/01/2023   CBC    Component Value Date/Time   WBC 3.5 09/13/2023 0812   WBC 3.8 (L) 09/05/2023 1109   WBC 6.1 04/25/2022 1206   RBC 3.78 09/13/2023 0812   RBC 3.99 09/05/2023 1109  HGB 12.0 09/13/2023 0812   HCT 36.6 09/13/2023 0812   PLT 222 09/13/2023 0812   MCV 97 09/13/2023 0812   MCH 31.7 09/13/2023 0812   MCH 30.8 09/05/2023 1109   MCHC 32.8 09/13/2023 0812   MCHC 33.9 09/05/2023 1109   RDW 14.1 09/13/2023 0812   Iron  Studies    Component Value Date/Time   IRON  53 09/05/2023 1109   IRON  57 07/01/2023 0904   TIBC 346 09/05/2023 1109   TIBC 271 07/01/2023 0904   FERRITIN 205 (H) 09/13/2023 0812   IRONPCTSAT 15 09/05/2023 1109   IRONPCTSAT 21 07/01/2023 0904    IRONPCTSAT 12 (L) 04/25/2022 1206   Lipid Panel     Component Value Date/Time   CHOL 169 07/01/2023 0904   TRIG 89 07/01/2023 0904   HDL 49 07/01/2023 0904   CHOLHDL 3 06/17/2020 1139   VLDL 27.6 06/17/2020 1139   LDLCALC 103 (H) 07/01/2023 0904   LDLDIRECT 236.0 12/22/2019 1131   Hepatic Function Panel     Component Value Date/Time   PROT 6.5 09/13/2023 0812   ALBUMIN 4.4 09/13/2023 0812   AST 25 09/13/2023 0812   AST 18 09/05/2023 1109   ALT 16 09/13/2023 0812   ALT 14 09/05/2023 1109   ALKPHOS 59 09/13/2023 0812   BILITOT 0.4 09/13/2023 0812   BILITOT 0.5 09/05/2023 1109   BILIDIR 0.0 04/25/2022 1206      Component Value Date/Time   TSH 0.463 07/01/2023 0904   Nutritional Lab Results  Component Value Date   VD25OH 69.3 07/01/2023   VD25OH 52.1 09/05/2022   VD25OH 68.7 03/28/2022     Assessment and Plan Assessment & Plan Class 3 Obesity Class 3 obesity with recent weight loss of 4 pounds over the past month. Following the category two eating plan approximately 70% of the time. Struggles with protein intake but has improved water  intake and fruit consumption. Engages in high levels of physical activity due to a busy lifestyle, contributing to weight loss despite recent steroid use. - Continue category two eating plan - Send Zepbound  prescription to [facility] in [place] Polyphagia potentially exacerbated by recent steroid use. Sweet cravings persist, possibly related to steroids, stress, or pain. Blood glucose levels were previously elevated but have normalized. Discussion on finding healthier alternatives to satisfy sweet cravings. - Try cottage cheese pudding recipe with sugar-free instant pudding, Chocolate Fair Life, and cottage cheese for a low-calorie, high-protein dessert  Chronic pain and emotional eating Chronic pain due to psoriatic arthritis, currently managed with a 5 mg steroid dose while awaiting the effects of Tremfya. Emotional  eating may be influenced by chronic pain and stress. Previous treatments with Cosentyx were ineffective, and insurance issues have delayed Acthar treatment. She is appealing insurance denial for Acthar and considering writing a patient impact statement with assistance from [employer]. - Continue current 5 mg steroid dose - Await effects of Tremfya for psoriatic arthritis - Consider writing a patient impact statement for Acthar appeal - Continue to be mindful of emotional eating triggers and set yourself up for success by eating the healthier foods so hunger is controlled.  Follow-Up Follow-up appointments are scheduled for October and November. Plans to schedule a December appointment due to provider's upcoming two-week absence. - Schedule December follow-up appointment    Aryani was informed of the importance of frequent follow up visits to maximize her success with intensive lifestyle modifications for her obesity and obesity related health conditions as recommended by USPSTF and CMS guidelines  Louann Penton, MD

## 2023-10-02 NOTE — Addendum Note (Signed)
 Addended by: LAFE BAKER CROME on: 10/02/2023 02:52 PM   Modules accepted: Orders

## 2023-10-03 ENCOUNTER — Ambulatory Visit: Admitting: Internal Medicine

## 2023-10-03 ENCOUNTER — Other Ambulatory Visit

## 2023-10-04 ENCOUNTER — Encounter: Payer: Self-pay | Admitting: Neurology

## 2023-10-18 ENCOUNTER — Other Ambulatory Visit: Payer: Self-pay | Admitting: Medical Genetics

## 2023-10-24 ENCOUNTER — Other Ambulatory Visit

## 2023-10-24 ENCOUNTER — Encounter: Payer: Self-pay | Admitting: Gastroenterology

## 2023-10-25 ENCOUNTER — Encounter: Admitting: Gastroenterology

## 2023-10-31 ENCOUNTER — Ambulatory Visit (INDEPENDENT_AMBULATORY_CARE_PROVIDER_SITE_OTHER): Admitting: Family Medicine

## 2023-10-31 NOTE — Progress Notes (Signed)
 [facility] OFFICE PROGRESS NOTE  Charlene [name] BRAVO, MD [phone] [address] [place] [address]  DIAGNOSIS: Iron  deficiency anemia secondary to history of gastric ulcer and hemorrhoids   PRIOR THERAPY: Iron  infusion with Venofer  300 Mg IV weekly for 3 weeks most recent dose on 09/11/23   CURRENT THERAPY: Over-the-counter ferrous sulfate  with vitamin C vs multivitamin   INTERVAL HISTORY: Charlene [name] 43 y.o. female returns to the clinic today for a follow-up visit.  The patient is seen for iron  deficiency anemia. She is here for a short interval follow up.    The patient was seen here last on 09/05/23. The patient is followed for IDA secondary initially for hemorrhoids which had been banded. Her Hbg had normalized until recently (over the Summer) when she developed worsening anemia.    She has had abrupt drop in her Hbg which was confirmed by our office as well as labs performed at her dermatologists office. She struggles with chronic pain and had been taking high doses of NSAIDS (800 mg TID) for several months. She also uses aspirin containing powders. Therefore, at that appointment, there was concern for stomach ulcers and GI blood loss, especially given history of gastritis from endoscopy in May 2024.   However, her ferritin was very elevated at that time. She deals with chronic pain due to psoriatic arthritis.   Her stool cards were negative. She was started on Protonix . She was supposed to have a colonoscopy but had to cancel it due to having a fever. She is needing to reschedule it but wanted to see her Hbg today. Her Hbg had also improved at her last visit on 09/05/23.   She was taking iron  supplemens every few days with vitamin C but is not consistent due to the constipation. She takes a multivitamin with iron  and a low content iron  a few days per week. However, she stopped this after her ferritin and iron  was off at her last appointment.  Has been struggling to obtain  insurance authorization for Actar prescribed by rheumatology.  She was placed on a high dose prednisone  taper recently which did help her pain but once her dose of prednisone  got low her pain returned.  She was put on a 5 mg prednisone  but it was ineffective and she has since stopped.  Previous treatments, including Cosentyx, were ineffective. She recently switched to Tremfya for psoriasis, which is also expected to help with her arthritis, though it takes time to show effects.  Her current medication regimen includes two Tylenol  Arthritis (650 mg each) and 800 mg of ibuprofen  at night, along with topical treatments like Voltaren cream and a heating pad. She also uses compression gloves and massagers to manage her symptoms. All of this is without improvement.   She experiences dizziness and shortness of breath, sometimes needing to stop and steady herself, and notes that these symptoms are not always related to exertion. She mentions some pressure in the epigastric area on occasion. She sees her pulmonary doctor next week and reports persistent cough in the AM regardless if she uses her CPAP machine.    She had some blood work ordered by [facility] that was supposed to be drawn today but she forgot to mention it to the lab.   She is here for evaluation and repeat blood work.   MEDICAL HISTORY: Past Medical History:  Diagnosis Date   Alcoholism (HCC)    Anxiety    Anxiety  and depression    Bipolar 2 disorder (HCC)    Borderline personality disorder (HCC)    Chest pain    Chronic fatigue syndrome    Claustrophobia    on meds   Constipation    Depression    on meds   Elevated blood sugar 06/08/2014   Elevated LFTs    Fatigue    Fatty liver 10/2014   GERD (gastroesophageal reflux disease)    diet related   Heart murmur    Hyperlipidemia    on meds   Infertility associated with anovulation    Joint pain    Low grade squamous intraepithelial lesion (LGSIL)  on Pap smear 10/30/2011   Major depressive disorder    Migraines    MS (multiple sclerosis)    Neuromuscular disorder (HCC)    Obesity    Obesity in pregnancy, antepartum    PCOS (polycystic ovarian syndrome)    Personal history of pre-term labor    Prediabetes    Pregnancy induced hypertension    pregnancy related- hx of   Psoriasis    Sleep apnea    SOB (shortness of breath)    Vitamin D  deficiency     ALLERGIES:  is allergic to ciprofloxacin, african mango [irvingia gabonensis], atorvastatin , and rosuvastatin .  MEDICATIONS:  Current Outpatient Medications  Medication Sig Dispense Refill   COSENTYX UNOREADY 300 MG/2ML SOAJ 300 mg. (Patient taking differently: Inject 300 mg as directed every 30 (thirty) days.)     Ferrous Sulfate  (IRON ) 28 MG TABS Take 28 mg by mouth daily.     gabapentin (NEURONTIN) 100 MG capsule Take 100 mg by mouth 2 (two) times daily as needed. (Patient taking differently: Take 100 mg by mouth as needed.)     lamoTRIgine (LAMICTAL) 200 MG tablet Take 200 mg by mouth daily. (Patient taking differently: Take 300 mg by mouth daily.)     Multiple Vitamin (MULTIVITAMIN) tablet Take 1 tablet by mouth daily.     ondansetron  (ZOFRAN ) 4 MG tablet Take 1 tablet (4 mg total) by mouth every 6 (six) hours as needed for nausea or vomiting. 20 tablet 0   pantoprazole  (PROTONIX ) 40 MG tablet TAKE 1 TABLET(40 MG) BY MOUTH DAILY 90 tablet 0   tirzepatide  (ZEPBOUND ) 12.5 MG/0.5ML Pen Inject 12.5 mg into the skin once a week. 2 mL 1   traZODone  (DESYREL ) 50 MG tablet Take 1 tablet (50 mg total) by mouth at bedtime. 30 tablet 5   TREMFYA 100 MG/ML prefilled syringe 1 mL Subcutaneous every 8 weeks; Duration: 60 days     TRINTELLIX 10 MG TABS tablet Take 10 mg by mouth daily.     Vitamin D , Ergocalciferol , (DRISDOL ) 1.25 MG (50000 UNIT) CAPS capsule Take 1 capsule (50,000 Units total) by mouth every 14 (fourteen) days. 6 capsule 0   VYVANSE 60 MG capsule Take 60 mg by mouth every  morning.     zolmitriptan  (ZOMIG ) 5 MG tablet TAKE ONE TABLET BY MOUTH AS NEEDED FOR MIGRAINES. MAY REPEAT IN TWO HOURS IF NEEDED. MAX 2 TABLETS A DAY OR 4 TABLETS A WEEK 10 tablet 6   No current facility-administered medications for this visit.    SURGICAL HISTORY:  Past Surgical History:  Procedure Laterality Date   COLONOSCOPY  10/17/2020   DILATION AND CURETTAGE OF UTERUS  08/15/2008   DILATION AND EVACUATION  12/29/2011   Procedure: DILATATION AND EVACUATION;  Surgeon: Winton Felt, MD;  Location: [facility];  Service: Gynecology;;  Dr, Edsel transferred case to  Dr. Alger   LAPAROSCOPIC GASTRIC SLEEVE RESECTION N/A 12/07/2014   Procedure: LAPAROSCOPIC GASTRIC SLEEVE RESECTION;  Surgeon: Alm Angle, MD;  Location: [facility];  Service: General;  Laterality: N/A;    REVIEW OF SYSTEMS:   Constitutional: Positive for fatigue. Negative for appetite change, chills, fever and unexpected weight change.  HENT: Negative for mouth sores, nosebleeds, sore throat and trouble swallowing.   Eyes: Negative for eye problems and icterus.  Respiratory: Positive for occasional shortness of breath and cough in the AM. Negative for hemoptysis and wheezing.   Cardiovascular: Negative for chest pain and leg swelling.  Gastrointestinal: Positive for occasional constipation. Negative for abdominal pain, diarrhea, nausea and vomiting.  Genitourinary: Negative for bladder incontinence, difficulty urinating, dysuria, frequency and hematuria.   Musculoskeletal: Positive for chronic joint pain. Negative for gait problem, neck pain and neck stiffness.  Skin: Negative for itching and rash.  Neurological: Positive for occasional dizziness. Negative for extremity weakness, gait problem, headaches, and seizures.  Hematological: Negative for adenopathy. Does not bruise/bleed easily.  Psychiatric/Behavioral: Negative for confusion, depression and sleep disturbance. The patient is not nervous/anxious.     PHYSICAL  EXAMINATION:  Blood pressure 115/76, pulse 88, temperature (!) 97.4 F (36.3 C), temperature source Temporal, resp. rate 16, height 5' 5.5 (1.664 m), weight 187 lb (84.8 kg), SpO2 100%.  ECOG PERFORMANCE STATUS: 1  Physical Exam  Constitutional: Oriented to person, place, and time and well-developed, well-nourished, and in no distress.  HENT:  Head: Normocephalic and atraumatic.  Mouth/Throat: Oropharynx is clear and moist. No oropharyngeal exudate.  Eyes: Conjunctivae are normal. Right eye exhibits no discharge. Left eye exhibits no discharge. No scleral icterus.  Neck: Normal range of motion. Neck supple.  Cardiovascular: Normal rate, regular rhythm, normal heart sounds and intact distal pulses.   Pulmonary/Chest: Effort normal and breath sounds normal. No respiratory distress. No wheezes. No rales.  Abdominal: Soft. Bowel sounds are normal. Exhibits no distension and no mass. There is no tenderness.  Musculoskeletal: Normal range of motion. Exhibits no edema.  Lymphadenopathy:    No cervical adenopathy.  Neurological: Alert and oriented to person, place, and time. Exhibits normal muscle tone. Gait normal. Coordination normal.  Skin: Skin is warm and dry. No rash noted. Not diaphoretic. No erythema. No pallor.  Psychiatric: Mood, memory and judgment normal.  Vitals reviewed.  LABORATORY DATA: Lab Results  Component Value Date   WBC 5.6 11/05/2023   HGB 14.4 11/05/2023   HCT 41.3 11/05/2023   MCV 89.8 11/05/2023   PLT 292 11/05/2023      Chemistry      Component Value Date/Time   NA 139 11/05/2023 1116   NA 137 09/13/2023 0812   K 4.6 11/05/2023 1116   CL 105 11/05/2023 1116   CO2 29 11/05/2023 1116   BUN 14 11/05/2023 1116   BUN 10 09/13/2023 0812   CREATININE 0.84 11/05/2023 1116   CREATININE 0.43 (L) 12/31/2013 1542      Component Value Date/Time   CALCIUM  9.9 11/05/2023 1116   ALKPHOS 46 11/05/2023 1116   AST 17 11/05/2023 1116   ALT 14 11/05/2023 1116    BILITOT 0.3 11/05/2023 1116       RADIOGRAPHIC STUDIES:  No results found.   ASSESSMENT/PLAN:  This is a very pleasant 43 year old Caucasian female with iron  deficiency anemia secondary to GI blood loss.   The patient receives IV iron  as needed with Venofer  the most recent dose being in August 2024.  She is currently taking  a iron  supplement a few times a week with vitamin C. She also takes a multivitamin with iron .    She started having new onset anemia in July 2025.  This did improved in August 2025.    Labs from today show normal Hbg of 14.4. Her iron  studies appear normal too.  Ferritin is pending but it has been elevated at recent appointments which is likely secondary to inflammatory secondary to her autoimmune disorders.  She does not need any IV iron  infusions at this time.  She does want to continue to keep a close eye on her iron  studies and ferritin.  Therefore we will arrange for labs and a follow-up in 3 months.  If her CBC and iron  studies are normal at her next appointment we discussed pushing her appointments farther out.  I do see the order for the lipid panel, vitamin D , and CMP that was ordered by [facility] yesterday.  However they are unable to be released from the order review.  Therefore I have entered these lab orders and I will route them to the ordering provider for management.  Continue taking Protonix .  She will also reach out to GI about whether or not she needs to reschedule her upper endoscopy.  She will continue taking her iron  supplement a few days a week and her multivitamin with iron .  She will continue to follow with neurology as well as her rheumatologist regarding her MS and psoriasis.  They are waiting for insurance approval for Acthar.will continue to follow them regarding her options for her chronic pain.   She will see pulmonary medicine next week regarding evaluation of the persistent cough.   The patient was  advised to call immediately if she has any concerning symptoms in the interval. The patient voices understanding of current disease status and treatment options and is in agreement with the current care plan. All questions were answered. The patient knows to call the clinic with any problems, questions or concerns. We can certainly see the patient much sooner if necessary   Orders Placed This Encounter  Procedures   CMP (Cancer Center only)    Standing Status:   Future    Number of Occurrences:   1    Expected Date:   11/05/2023    Expiration Date:   11/04/2024   Vitamin D  25 hydroxy    Standing Status:   Future    Number of Occurrences:   1    Expected Date:   11/05/2023    Expiration Date:   11/04/2024   Lipid panel    Standing Status:   Future    Number of Occurrences:   1    Expected Date:   11/05/2023    Expiration Date:   11/04/2024   CBC with Differential (Cancer Center Only)    Standing Status:   Future    Expected Date:   02/05/2024    Expiration Date:   11/04/2024   Iron  and Iron  Binding Capacity ([facility] only)    Standing Status:   Future    Expected Date:   02/05/2024    Expiration Date:   11/04/2024   Ferritin    Standing Status:   Future    Expected Date:   02/05/2024    Expiration Date:   11/04/2024      The total time spent in the appointment was 30-39 minutes  Mercy Leppla L Lennox Leikam, PA-C 11/05/23

## 2023-11-04 ENCOUNTER — Encounter: Payer: Self-pay | Admitting: Internal Medicine

## 2023-11-04 ENCOUNTER — Encounter (INDEPENDENT_AMBULATORY_CARE_PROVIDER_SITE_OTHER): Payer: Self-pay | Admitting: Family Medicine

## 2023-11-04 ENCOUNTER — Ambulatory Visit (INDEPENDENT_AMBULATORY_CARE_PROVIDER_SITE_OTHER): Admitting: Family Medicine

## 2023-11-04 VITALS — BP 115/78 | HR 92 | Temp 97.9°F | Ht 65.5 in | Wt 185.0 lb

## 2023-11-04 DIAGNOSIS — E875 Hyperkalemia: Secondary | ICD-10-CM | POA: Diagnosis not present

## 2023-11-04 DIAGNOSIS — E559 Vitamin D deficiency, unspecified: Secondary | ICD-10-CM | POA: Diagnosis not present

## 2023-11-04 DIAGNOSIS — R632 Polyphagia: Secondary | ICD-10-CM | POA: Diagnosis not present

## 2023-11-04 DIAGNOSIS — E669 Obesity, unspecified: Secondary | ICD-10-CM

## 2023-11-04 DIAGNOSIS — Z6841 Body Mass Index (BMI) 40.0 and over, adult: Secondary | ICD-10-CM

## 2023-11-04 DIAGNOSIS — Z6830 Body mass index (BMI) 30.0-30.9, adult: Secondary | ICD-10-CM

## 2023-11-04 MED ORDER — VITAMIN D (ERGOCALCIFEROL) 1.25 MG (50000 UNIT) PO CAPS
50000.0000 [IU] | ORAL_CAPSULE | ORAL | 0 refills | Status: DC
Start: 2023-11-04 — End: 2023-12-02

## 2023-11-04 MED ORDER — TIRZEPATIDE-WEIGHT MANAGEMENT 12.5 MG/0.5ML ~~LOC~~ SOAJ: 12.5000 mg | SUBCUTANEOUS | 1 refills | Status: DC

## 2023-11-04 NOTE — Progress Notes (Signed)
 Office: [phone]  /  Fax: [phone]  WEIGHT SUMMARY AND BIOMETRICS  Anthropometric Measurements Height: 5' 5.5 (1.664 m) Weight: 185 lb (83.9 kg) BMI (Calculated): 30.31 Weight at Last Visit: 185 lb Weight Lost Since Last Visit: 0 Weight Gained Since Last Visit: 0 Starting Weight: 250 lb Total Weight Loss (lbs): 65 lb (29.5 kg) Peak Weight: 260 lb   Body Composition  Body Fat %: 37.1 % Fat Mass (lbs): 68.6 lbs Muscle Mass (lbs): 110.6 lbs Total Body Water  (lbs): 79 lbs Visceral Fat Rating : 8   Other Clinical Data Fasting: no Labs: no Today's Visit #: 31 Starting Date: 03/29/21    Chief Complaint: OBESITY    History of Present Illness Charlene [name] is a 43 year old female with obesity and psoriatic arthritis who presents for obesity treatment and progress assessment.  She has been following a category two eating plan but struggles with adherence. Despite this, she has maintained her weight over the last month. She has lost 65 pounds overall and is actively working on weight loss.  She reports a flare-up of her psoriatic arthritis, ongoing for quite some time. She was on a high-dose steroid taper and is currently on a maintenance dose of 5 mg prednisone , which she recently stopped due to ineffectiveness and concerns about side effects. She experiences significant pain, particularly in her hands and wrists, and has difficulty sleeping due to the pain. She is dealing with insurance issues regarding coverage for Acthar, prescribed for eye inflammation potentially related to her psoriatic arthritis.  She has a history of elevated potassium levels, with a recent lab result from August showing a potassium level of 6.0, although she typically runs in the threes.  She has been experiencing diarrhea, which she attributes to starting Tremfya, although it could be a combination of medications, including Zepbound  and Trintellix. She is managing this by increasing her water   intake. Previously, she dealt with constipation, so she finds the diarrhea more manageable.  She is currently taking Zepbound  for polyphagia, Tremfya for psoriatic arthritis, and Trintellix, which she believes causes gas. She has recently stopped taking prednisone . She is also taking vitamin D  intermittently and plans to have her levels checked.      PHYSICAL EXAM:  Blood pressure 115/78, pulse 92, temperature 97.9 F (36.6 C), height 5' 5.5 (1.664 m), weight 185 lb (83.9 kg), SpO2 99%. Body mass index is 30.32 kg/m.  DIAGNOSTIC DATA REVIEWED:  BMET    Component Value Date/Time   NA 137 09/13/2023 0812   K 6.0 (H) 09/13/2023 0812   CL 102 09/13/2023 0812   CO2 20 09/13/2023 0812   GLUCOSE 77 09/13/2023 0812   GLUCOSE 81 09/05/2023 1109   GLUCOSE 93 11/24/2013 1126   BUN 10 09/13/2023 0812   CREATININE 0.62 09/13/2023 0812   CREATININE 0.55 09/05/2023 1109   CREATININE 0.43 (L) 12/31/2013 1542   CALCIUM  8.8 09/13/2023 0812   GFRNONAA >60 09/05/2023 1109   GFRAA >60 12/02/2014 1120   Lab Results  Component Value Date   HGBA1C 4.9 07/01/2023   HGBA1C 4.9 05/05/2018   Lab Results  Component Value Date   INSULIN  7.2 07/01/2023   INSULIN  15.6 03/29/2021   Lab Results  Component Value Date   TSH 0.463 07/01/2023   CBC    Component Value Date/Time   WBC 3.5 09/13/2023 0812   WBC 3.8 (L) 09/05/2023 1109   WBC 6.1 04/25/2022 1206   RBC 3.78 09/13/2023 0812   RBC 3.99 09/05/2023  1109   HGB 12.0 09/13/2023 0812   HCT 36.6 09/13/2023 0812   PLT 222 09/13/2023 0812   MCV 97 09/13/2023 0812   MCH 31.7 09/13/2023 0812   MCH 30.8 09/05/2023 1109   MCHC 32.8 09/13/2023 0812   MCHC 33.9 09/05/2023 1109   RDW 14.1 09/13/2023 0812   Iron  Studies    Component Value Date/Time   IRON  53 09/05/2023 1109   IRON  57 07/01/2023 0904   TIBC 346 09/05/2023 1109   TIBC 271 07/01/2023 0904   FERRITIN 205 (H) 09/13/2023 0812   IRONPCTSAT 15 09/05/2023 1109   IRONPCTSAT 21  07/01/2023 0904   IRONPCTSAT 12 (L) 04/25/2022 1206   Lipid Panel     Component Value Date/Time   CHOL 169 07/01/2023 0904   TRIG 89 07/01/2023 0904   HDL 49 07/01/2023 0904   CHOLHDL 3 06/17/2020 1139   VLDL 27.6 06/17/2020 1139   LDLCALC 103 (H) 07/01/2023 0904   LDLDIRECT 236.0 12/22/2019 1131   Hepatic Function Panel     Component Value Date/Time   PROT 6.5 09/13/2023 0812   ALBUMIN 4.4 09/13/2023 0812   AST 25 09/13/2023 0812   AST 18 09/05/2023 1109   ALT 16 09/13/2023 0812   ALT 14 09/05/2023 1109   ALKPHOS 59 09/13/2023 0812   BILITOT 0.4 09/13/2023 0812   BILITOT 0.5 09/05/2023 1109   BILIDIR 0.0 04/25/2022 1206      Component Value Date/Time   TSH 0.463 07/01/2023 0904   Nutritional Lab Results  Component Value Date   VD25OH 69.3 07/01/2023   VD25OH 52.1 09/05/2022   VD25OH 68.7 03/28/2022     Assessment and Plan Assessment & Plan Obesity with polyphagia Obesity management is ongoing with Zepbound  to address polyphagia. Despite challenges with psoriatic arthritis and corticosteroid use, weight has been maintained over the last month. Zepbound  is likely contributing to the prevention of weight gain during steroid treatment. - Continue Zepbound  for polyphagia management - Refill Zepbound  prescription  Psoriatic arthritis flare Psoriatic arthritis has flared, causing significant pain, particularly in the hands and wrists. She has been on a high-dose steroid taper and is now on a low-dose prednisone  regimen, which she has recently stopped due to ineffectiveness and concerns about side effects, including increased irritability and appetite. There are ongoing challenges with insurance coverage for Acthar, which is being pursued as an alternative treatment. She is experiencing significant frustration with the insurance process and the lack of effective treatment options. - Monitor symptoms after discontinuation of prednisone  - Continue to monitor for increased  emotional eating while stress level is elevated - Continue anti-inflammatory diet with increase in vegetable and low sugar fruits and lean protein.  Elevated potassium (hyperkalemia) A previous lab result showed an elevated potassium level of 6.0, which is atypical for her. This may be due to a lab error such as hemolysis, but needs to be rechecked to ensure it is not a new or worsening issue. - Order repeat potassium level with upcoming blood work  Vitamin D  deficiency Vitamin D  supplementation is ongoing, with her taking it intermittently every two to three weeks. A vitamin D  level has not been checked in four months. - Order vitamin D  level with upcoming blood work - Send vitamin D  prescription to [facility] in [place] [name] was informed of the importance of frequent follow up visits to maximize her success with intensive lifestyle modifications for her obesity and obesity related health conditions as recommended by USPSTF and CMS  guidelines   Louann Penton, MD

## 2023-11-05 ENCOUNTER — Encounter (HOSPITAL_BASED_OUTPATIENT_CLINIC_OR_DEPARTMENT_OTHER): Payer: Self-pay | Admitting: Internal Medicine

## 2023-11-05 ENCOUNTER — Inpatient Hospital Stay: Admitting: Physician Assistant

## 2023-11-05 ENCOUNTER — Other Ambulatory Visit: Payer: Self-pay

## 2023-11-05 ENCOUNTER — Inpatient Hospital Stay: Attending: Internal Medicine

## 2023-11-05 VITALS — BP 115/76 | HR 88 | Temp 97.4°F | Resp 16 | Ht 65.5 in | Wt 187.0 lb

## 2023-11-05 DIAGNOSIS — D649 Anemia, unspecified: Secondary | ICD-10-CM

## 2023-11-05 DIAGNOSIS — K649 Unspecified hemorrhoids: Secondary | ICD-10-CM | POA: Insufficient documentation

## 2023-11-05 DIAGNOSIS — Z8711 Personal history of peptic ulcer disease: Secondary | ICD-10-CM | POA: Diagnosis not present

## 2023-11-05 DIAGNOSIS — D500 Iron deficiency anemia secondary to blood loss (chronic): Secondary | ICD-10-CM | POA: Diagnosis present

## 2023-11-05 DIAGNOSIS — D509 Iron deficiency anemia, unspecified: Secondary | ICD-10-CM

## 2023-11-05 LAB — CMP (CANCER CENTER ONLY)
ALT: 14 U/L (ref 0–44)
AST: 17 U/L (ref 15–41)
Albumin: 4.5 g/dL (ref 3.5–5.0)
Alkaline Phosphatase: 46 U/L (ref 38–126)
Anion gap: 5 (ref 5–15)
BUN: 14 mg/dL (ref 6–20)
CO2: 29 mmol/L (ref 22–32)
Calcium: 9.9 mg/dL (ref 8.9–10.3)
Chloride: 105 mmol/L (ref 98–111)
Creatinine: 0.84 mg/dL (ref 0.44–1.00)
GFR, Estimated: >60 mL/min (ref >60)
Glucose, Bld: 84 mg/dL (ref 70–99)
Potassium: 4.6 mmol/L (ref 3.5–5.1)
Sodium: 139 mmol/L (ref 135–145)
Total Bilirubin: 0.3 mg/dL (ref 0.0–1.2)
Total Protein: 7 g/dL (ref 6.5–8.1)

## 2023-11-05 LAB — CBC WITH DIFFERENTIAL (CANCER CENTER ONLY)
Abs Immature Granulocytes: 0.02 K/uL (ref 0.00–0.07)
Basophils Absolute: 0.1 K/uL (ref 0.0–0.1)
Basophils Relative: 1 %
Eosinophils Absolute: 0.1 K/uL (ref 0.0–0.5)
Eosinophils Relative: 3 %
HCT: 41.3 % (ref 36.0–46.0)
Hemoglobin: 14.4 g/dL (ref 12.0–15.0)
Immature Granulocytes: 0 %
Lymphocytes Relative: 21 %
Lymphs Abs: 1.2 K/uL (ref 0.7–4.0)
MCH: 31.3 pg (ref 26.0–34.0)
MCHC: 34.9 g/dL (ref 30.0–36.0)
MCV: 89.8 fL (ref 80.0–100.0)
Monocytes Absolute: 0.6 K/uL (ref 0.1–1.0)
Monocytes Relative: 11 %
Neutro Abs: 3.6 K/uL (ref 1.7–7.7)
Neutrophils Relative %: 64 %
Platelet Count: 292 K/uL (ref 150–400)
RBC: 4.6 MIL/uL (ref 3.87–5.11)
RDW: 11.9 % (ref 11.5–15.5)
WBC Count: 5.6 K/uL (ref 4.0–10.5)
nRBC: 0 % (ref 0.0–0.2)

## 2023-11-05 LAB — IRON AND IRON BINDING CAPACITY (CC-WL,HP ONLY)
Iron: 68 ug/dL (ref 28–170)
Saturation Ratios: 20 % (ref 10.4–31.8)
TIBC: 347 ug/dL (ref 250–450)
UIBC: 279 ug/dL (ref 148–442)

## 2023-11-05 LAB — LIPID PANEL
Cholesterol: 276 mg/dL — ABNORMAL HIGH (ref 0–200)
HDL: 57 mg/dL (ref >40)
LDL Cholesterol: 160 mg/dL — ABNORMAL HIGH (ref 0–99)
Total CHOL/HDL Ratio: 4.8 ratio
Triglycerides: 294 mg/dL — ABNORMAL HIGH (ref <150)
VLDL: 59 mg/dL — ABNORMAL HIGH (ref 0–40)

## 2023-11-05 LAB — VITAMIN D 25 HYDROXY (VIT D DEFICIENCY, FRACTURES): Vit D, 25-Hydroxy: 42.87 ng/mL (ref 30–100)

## 2023-11-05 LAB — FERRITIN: Ferritin: 31 ng/mL (ref 11–307)

## 2023-11-06 ENCOUNTER — Ambulatory Visit (INDEPENDENT_AMBULATORY_CARE_PROVIDER_SITE_OTHER): Payer: Self-pay | Admitting: Family Medicine

## 2023-11-06 NOTE — Progress Notes (Signed)
 Thank you :)

## 2023-11-08 ENCOUNTER — Encounter (HOSPITAL_BASED_OUTPATIENT_CLINIC_OR_DEPARTMENT_OTHER): Payer: Self-pay | Admitting: Family

## 2023-11-08 ENCOUNTER — Other Ambulatory Visit (HOSPITAL_BASED_OUTPATIENT_CLINIC_OR_DEPARTMENT_OTHER): Payer: Self-pay | Admitting: Family

## 2023-11-08 DIAGNOSIS — E7800 Pure hypercholesterolemia, unspecified: Secondary | ICD-10-CM

## 2023-11-12 ENCOUNTER — Ambulatory Visit: Admitting: Pulmonary Disease

## 2023-11-15 ENCOUNTER — Other Ambulatory Visit: Payer: Self-pay | Admitting: Physician Assistant

## 2023-11-15 DIAGNOSIS — D509 Iron deficiency anemia, unspecified: Secondary | ICD-10-CM

## 2023-11-26 ENCOUNTER — Encounter: Admitting: Gastroenterology

## 2023-11-27 ENCOUNTER — Ambulatory Visit (INDEPENDENT_AMBULATORY_CARE_PROVIDER_SITE_OTHER)

## 2023-11-27 ENCOUNTER — Encounter (HOSPITAL_BASED_OUTPATIENT_CLINIC_OR_DEPARTMENT_OTHER): Payer: Self-pay

## 2023-11-27 ENCOUNTER — Telehealth: Payer: Self-pay | Admitting: Pharmacy Technician

## 2023-11-27 VITALS — BP 122/81 | HR 88 | Ht 65.0 in | Wt 186.0 lb

## 2023-11-27 DIAGNOSIS — R052 Subacute cough: Secondary | ICD-10-CM | POA: Diagnosis not present

## 2023-11-27 DIAGNOSIS — Z87891 Personal history of nicotine dependence: Secondary | ICD-10-CM

## 2023-11-27 DIAGNOSIS — G4733 Obstructive sleep apnea (adult) (pediatric): Secondary | ICD-10-CM

## 2023-11-27 NOTE — Patient Instructions (Signed)
 Complete labs, Chest XR, and PFTs as ordered.  Plan for follow up after PFTs completed.

## 2023-11-27 NOTE — Telephone Encounter (Signed)
 Auth Submission: APPROVED Site of care: Site of care: [facility] Payer: [employer] Medication & CPT/J Code(s) submitted: Ocrevus  Antoniette) [phone] Diagnosis Code:  Route of submission (phone, fax, portal): PORTAL Phone # Fax # Auth type: Buy/Bill PB Units/visits requested: X2 DOSES Reference number: [id number] Approval from: 01/29/23 to 01/29/24

## 2023-11-27 NOTE — Progress Notes (Signed)
 @Patient  ID: Charlene [name], female    DOB: [birth year], 43 y.o.   MRN: [id number]  No chief complaint on file.   Referring provider: Tabori, Katherine E, MD  HPI: Discussed the use of AI scribe software for clinical note transcription with the patient, who gave verbal consent to proceed.  History of Present Illness Charlene [name] is a 43 year old female with psoriatic arthritis who presents with a persistent cough.  She has been experiencing a persistent cough for several months, which alternates between being productive with yellowish sputum and being dry and forceful. The cough is most pronounced in the morning and can last for a couple of hours. She feels it is not related to allergies, dust, or environmental changes, as it persists regardless of location or season. She has not been diagnosed with asthma or COPD and does not use inhalers. She is a former smoker with a history of 15 pack years, having quit many years ago. She describes a sensation of tightness in the chest when coughing, with difficulty expelling sputum when it is lower in the chest.  She has stopped using her CPAP machine, suspecting it might be contributing to her symptoms, although she is unsure. There has been a slight improvement in her cough since discontinuing CPAP use, but the improvement is not significant. She has new CPAP equipment ready to use but has been hesitant to restart due to sleep disturbances caused by her psoriatic arthritis.  She experiences painful psoriatic arthritis, particularly affecting her hands, wrists, and sometimes her neck. This pain significantly disrupts her sleep, limiting her to about four hours of broken sleep per night. The pain is severe enough to wake her up and impact her daily functioning.  She has a history of iron  deficiency anemia, which has caused her to feel weak and fatigued, particularly in the mornings. She also has multiple sclerosis, which affects her more cognitively than  physically. She experiences lightheadedness and dizziness throughout the day, which she attributes to her anemia and poor sleep due to arthritis. She is currently on Zepbound  and was previously on Wegovy . She also takes Repatha  for cholesterol management. She has had a negative TB test in the past six months before starting Cosentyx for her psoriatic arthritis.  Last OV 12/06/2022: In for follow-up today Has been getting back to using CPAP on a nightly basis   Feels better when she uses it   Weight is stable   Currently having symptoms suggesting an upper respiratory tract infection with cough, congestion, mucus production Shortness of breath with every activity Still having some mask issues Historically Usually goes to bed between 930 and 10:30 PM 15 to 20 minutes to fall asleep Multiple awakenings final wake up time usually about 10 AM Usually wakes up about 6 AM to get at kids ready for school, will go back to laying in bed and finally try and get up by 10 Will usually use the snooze button multiple times before finally getting up   Very easy for her to take a daytime nap   Admits to dryness of mouth in the morning Occasional sore throat Occasional night sweats Sometimes wake up wakes up with a headache   Dad snored Reformed smoker    TEST/EVENTS : Sleep study with moderate obstructive sleep apnea with an AHI of 16   Allergies  Allergen Reactions   Ciprofloxacin Other (See Comments)    Other reaction(s): Mental Status Changes  Manic episode   Manic episode Manic episode  Other reaction(s): Mental Status Changes  Manic episode   African Mango [Irvingia [place]onensis] Other (See Comments)   Atorvastatin  Other (See Comments)    Myalgias   Rosuvastatin  Other (See Comments)    Myalgias    Immunization History  Administered Date(s) Administered   Influenza,inj,Quad PF,6+ Mos 11/03/2013   PFIZER(Purple Top)SARS-COV-2 Vaccination 03/27/2019, 04/20/2019   Tdap  11/03/2013   Zoster Recombinant(Shingrix ) 10/12/2021    Past Medical History:  Diagnosis Date   Alcoholism (HCC)    Anxiety    Anxiety and depression    Bipolar 2 disorder (HCC)    Borderline personality disorder (HCC)    Chest pain    Chronic fatigue syndrome    Claustrophobia    on meds   Constipation    Depression    on meds   Elevated blood sugar 06/08/2014   Elevated LFTs    Fatigue    Fatty liver 10/2014   GERD (gastroesophageal reflux disease)    diet related   Heart murmur    Hyperlipidemia    on meds   Infertility associated with anovulation    Joint pain    Low grade squamous intraepithelial lesion (LGSIL) on Pap smear 10/30/2011   Major depressive disorder    Migraines    MS (multiple sclerosis)    Neuromuscular disorder (HCC)    Obesity    Obesity in pregnancy, antepartum    PCOS (polycystic ovarian syndrome)    Personal history of pre-term labor    Prediabetes    Pregnancy induced hypertension    pregnancy related- hx of   Psoriasis    Sleep apnea    SOB (shortness of breath)    Vitamin D  deficiency     Tobacco History: Social History   Tobacco Use  Smoking Status Former   Current packs/day: 0.00   Average packs/day: 1 pack/day for 15.0 years (15.0 ttl pk-yrs)   Types: Cigarettes   Start date: 07/02/1995   Quit date: 07/02/2010   Years since quitting: 13.4  Smokeless Tobacco Never   Counseling given: Not Answered   Outpatient Medications Prior to Visit  Medication Sig Dispense Refill   COSENTYX UNOREADY 300 MG/2ML SOAJ 300 mg. (Patient taking differently: Inject 300 mg as directed every 30 (thirty) days.)     Ferrous Sulfate  (IRON ) 28 MG TABS Take 28 mg by mouth daily.     lamoTRIgine (LAMICTAL) 200 MG tablet Take 200 mg by mouth daily. (Patient taking differently: Take 300 mg by mouth daily.)     Multiple Vitamin (MULTIVITAMIN) tablet Take 1 tablet by mouth daily.     ondansetron  (ZOFRAN ) 4 MG tablet Take 1 tablet (4 mg total) by mouth  every 6 (six) hours as needed for nausea or vomiting. 20 tablet 0   pantoprazole  (PROTONIX ) 40 MG tablet TAKE 1 TABLET(40 MG) BY MOUTH DAILY 90 tablet 0   tirzepatide  (ZEPBOUND ) 12.5 MG/0.5ML Pen Inject 12.5 mg into the skin once a week. 2 mL 1   traZODone  (DESYREL ) 50 MG tablet Take 1 tablet (50 mg total) by mouth at bedtime. 30 tablet 5   TREMFYA 100 MG/ML prefilled syringe 1 mL Subcutaneous every 8 weeks; Duration: 60 days     TRINTELLIX 10 MG TABS tablet Take 10 mg by mouth daily.     Vitamin D , Ergocalciferol , (DRISDOL ) 1.25 MG (50000 UNIT) CAPS capsule Take 1 capsule (50,000 Units total) by mouth every 14 (fourteen) days. 6 capsule 0   VYVANSE 60 MG capsule Take 60 mg by mouth every  morning.     zolmitriptan  (ZOMIG ) 5 MG tablet TAKE ONE TABLET BY MOUTH AS NEEDED FOR MIGRAINES. MAY REPEAT IN TWO HOURS IF NEEDED. MAX 2 TABLETS A DAY OR 4 TABLETS A WEEK 10 tablet 6   gabapentin (NEURONTIN) 100 MG capsule Take 100 mg by mouth 2 (two) times daily as needed. (Patient not taking: Reported on 11/27/2023)     No facility-administered medications prior to visit.     Review of Systems: as per HPI  Constitutional:   No  weight loss, night sweats,  Fevers, chills, fatigue, or  lassitude.  HEENT:   No headaches,  Difficulty swallowing,  Tooth/dental problems, or  Sore throat,                No sneezing, itching, ear ache, nasal congestion, post nasal drip,   CV:  No chest pain,  Orthopnea, PND, swelling in lower extremities, anasarca, dizziness, palpitations, syncope.   GI  No heartburn, indigestion, abdominal pain, nausea, vomiting, diarrhea, change in bowel habits, loss of appetite, bloody stools.   Resp: No shortness of breath with exertion or at rest.  No excess mucus, no productive cough,  No non-productive cough,  No coughing up of blood.  No change in color of mucus.  No wheezing.  No chest wall deformity  Skin: no rash or lesions.  GU: no dysuria, change in color of urine, no urgency or  frequency.  No flank pain, no hematuria   MS:  No joint pain or swelling.  No decreased range of motion.  No back pain.    Physical Exam  BP 122/81   Pulse 88   Ht 5' 5 (1.651 m)   Wt 186 lb (84.4 kg)   SpO2 100%   BMI 30.95 kg/m   GEN: A/Ox3; pleasant , NAD, well nourished    HEENT:  [place]/AT,  EACs-clear, TMs-wnl, NOSE-clear, THROAT-clear, no lesions, no postnasal drip or exudate noted.   NECK:  Supple w/ fair ROM; no JVD; normal carotid impulses w/o bruits; no thyromegaly or nodules palpated; no lymphadenopathy.    RESP  Clear  P & A; w/o, wheezes/ rales/ or rhonchi. no accessory muscle use, no dullness to percussion  CARD:  RRR, no m/r/g, no peripheral edema, pulses intact, no cyanosis or clubbing.  GI:   Soft & nt; nml bowel sounds; no organomegaly or masses detected.   Musco: Warm bil, no deformities or joint swelling noted.   Neuro: alert, no focal deficits noted.    Skin: Warm, no lesions or rashes    Lab Results:  CBC    Component Value Date/Time   WBC 5.6 11/05/2023 1116   WBC 6.1 04/25/2022 1206   RBC 4.60 11/05/2023 1116   HGB 14.4 11/05/2023 1116   HGB 12.0 09/13/2023 0812   HCT 41.3 11/05/2023 1116   HCT 36.6 09/13/2023 0812   PLT 292 11/05/2023 1116   PLT 222 09/13/2023 0812   MCV 89.8 11/05/2023 1116   MCV 97 09/13/2023 0812   MCH 31.3 11/05/2023 1116   MCHC 34.9 11/05/2023 1116   RDW 11.9 11/05/2023 1116   RDW 14.1 09/13/2023 0812   LYMPHSABS 1.2 11/05/2023 1116   LYMPHSABS 1.6 09/13/2023 0812   MONOABS 0.6 11/05/2023 1116   EOSABS 0.1 11/05/2023 1116   EOSABS 0.1 09/13/2023 0812   BASOSABS 0.1 11/05/2023 1116   BASOSABS 0.0 09/13/2023 0812    BMET    Component Value Date/Time   NA 139 11/05/2023 1116   NA 137  09/13/2023 0812   K 4.6 11/05/2023 1116   CL 105 11/05/2023 1116   CO2 29 11/05/2023 1116   GLUCOSE 84 11/05/2023 1116   GLUCOSE 93 11/24/2013 1126   BUN 14 11/05/2023 1116   BUN 10 09/13/2023 0812   CREATININE 0.84  11/05/2023 1116   CREATININE 0.43 (L) 12/31/2013 1542   CALCIUM  9.9 11/05/2023 1116   GFRNONAA >60 11/05/2023 1116   GFRAA >60 12/02/2014 1120    BNP No results found for: BNP  ProBNP No results found for: PROBNP  Imaging: No results found.  Administration History     None           No data to display          No results found for: NITRICOXIDE   Assessment & Plan:   Assessment & Plan Subacute cough  OSA on CPAP  Former smoker  Assessment and Plan Assessment & Plan Chronic cough Persisting cough with yellowish sputum and chest tightness. Differential includes reversible airway constriction or smoldering infection. Former smoker with 15 pack-years. Lungs clear on examination. - Ordered pulmonary function test to assess for reversible airway constriction. - Ordered chest x-ray to evaluate for underlying pathology. - Ordered blood work to check for smoldering infection or allergic source.  Obstructive sleep apnea, on CPAP therapy Discontinued CPAP due to psoriatic arthritis pain and poor sleep quality. No suspicion that CPAP is the cause of the cough. New CPAP equipment available. - Encouraged resumption of CPAP therapy. - Advised on CPAP maintenance: wash with soap and water , perform half water , half vinegar rinse weekly.    Return in about 6 weeks (around 01/08/2024) for PFT.  Candis Dandy, PA-C 11/27/2023

## 2023-11-28 ENCOUNTER — Other Ambulatory Visit

## 2023-11-28 DIAGNOSIS — Z006 Encounter for examination for normal comparison and control in clinical research program: Secondary | ICD-10-CM

## 2023-11-29 LAB — IGG, IGA, IGM
IgG (Immunoglobin G), Serum: 872 mg/dL (ref 586–1602)
IgM (Immunoglobulin M), Srm: 23 mg/dL — ABNORMAL LOW (ref 26–217)
Immunoglobulin A, (IgA) QN, Serum: 170 mg/dL (ref 87–352)

## 2023-11-29 LAB — IGE: IgE (Immunoglobulin E), Serum: 4 [IU]/mL — ABNORMAL LOW (ref 6–495)

## 2023-12-02 ENCOUNTER — Ambulatory Visit (INDEPENDENT_AMBULATORY_CARE_PROVIDER_SITE_OTHER): Payer: Self-pay | Admitting: Family Medicine

## 2023-12-02 ENCOUNTER — Encounter (INDEPENDENT_AMBULATORY_CARE_PROVIDER_SITE_OTHER): Payer: Self-pay | Admitting: Family Medicine

## 2023-12-02 VITALS — BP 125/79 | HR 95 | Temp 98.1°F | Ht 65.5 in | Wt 181.0 lb

## 2023-12-02 DIAGNOSIS — L4050 Arthropathic psoriasis, unspecified: Secondary | ICD-10-CM

## 2023-12-02 DIAGNOSIS — R632 Polyphagia: Secondary | ICD-10-CM

## 2023-12-02 DIAGNOSIS — Z6829 Body mass index (BMI) 29.0-29.9, adult: Secondary | ICD-10-CM

## 2023-12-02 DIAGNOSIS — E669 Obesity, unspecified: Secondary | ICD-10-CM | POA: Diagnosis not present

## 2023-12-02 DIAGNOSIS — E559 Vitamin D deficiency, unspecified: Secondary | ICD-10-CM

## 2023-12-02 MED ORDER — VITAMIN D (ERGOCALCIFEROL) 1.25 MG (50000 UNIT) PO CAPS: 50000.0000 [IU] | ORAL_CAPSULE | ORAL | 0 refills | Status: AC

## 2023-12-02 MED ORDER — TIRZEPATIDE-WEIGHT MANAGEMENT 12.5 MG/0.5ML ~~LOC~~ SOAJ: 12.5000 mg | SUBCUTANEOUS | 1 refills | Status: DC

## 2023-12-02 NOTE — Progress Notes (Signed)
 Office: [phone]  /  Fax: [phone]  WEIGHT SUMMARY AND BIOMETRICS  Anthropometric Measurements Height: 5' 5.5 (1.664 m) Weight: 181 lb (82.1 kg) BMI (Calculated): 29.65 Weight at Last Visit: 185 lb Weight Lost Since Last Visit: 4 lb Weight Gained Since Last Visit: 0 Starting Weight: 250 lb Total Weight Loss (lbs): 69 lb (31.3 kg) Peak Weight: 260 lb   Body Composition  Body Fat %: 37.1 % Fat Mass (lbs): 67.2 lbs Muscle Mass (lbs): 108.2 lbs Total Body Water  (lbs): 80.2 lbs Visceral Fat Rating : 7   Other Clinical Data Fasting: no Labs: no Today's Visit #: 32 Starting Date: 03/29/21    Chief Complaint: OBESITY   History of Present Illness Charlene [name] is a 43 year old female with obesity who presents for obesity treatment and progress assessment.  She has been adhering to a category two eating plan approximately 70% of the time, resulting in a four-pound weight loss over the past month. She is not participating in formal exercise but maintains an active lifestyle, frequently moving and rarely sitting down. Her hectic lifestyle, influenced by family commitments such as her husband and daughter's involvement in a theater production, has disrupted her routine. Despite this, she attempts to make mindful food choices when dining out, although she sometimes falls short of her dietary guidelines. She identifies as a stress eater but ensures she consumes the equivalent of three meals daily, albeit not in full meal sittings.  Her current medications include Zepbound  for weight management; she previously used Wegovy . She has discontinued Zofran  and prescription iron  due to insurance issues, opting for a lower dose over-the-counter iron  supplement. She has transitioned from Cosentyx to [facility] for her psoriatic arthritis and has started duloxetine while tapering off Trintellix in hopes that it might help with joint pain. She continues to take lamotrigine at 300 mg.  She  has a history of psoriatic arthritis, causing significant joint pain, particularly at night, which affects her sleep. Higher dose steroids have been effective in the past, but ongoing low-dose steroids have not provided relief. A recent ultrasound showed minimal inflammation, yet she experiences severe pain, especially in her wrists and fingers.  She is concerned about potential changes in her insurance coverage affecting her access to Zepbound , which has been covered by [employer]. She worries about maintaining her weight loss if she loses access to this medication. Additionally, she reports hair thinning, which she attributes to potential malnutrition or low protein intake, despite efforts to meet dietary protein goals 75-80% of the time.      PHYSICAL EXAM:  Blood pressure 125/79, pulse 95, temperature 98.1 F (36.7 C), height 5' 5.5 (1.664 m), weight 181 lb (82.1 kg), SpO2 100%. Body mass index is 29.66 kg/m.  DIAGNOSTIC DATA REVIEWED:  BMET    Component Value Date/Time   NA 139 11/05/2023 1116   NA 137 09/13/2023 0812   K 4.6 11/05/2023 1116   CL 105 11/05/2023 1116   CO2 29 11/05/2023 1116   GLUCOSE 84 11/05/2023 1116   GLUCOSE 93 11/24/2013 1126   BUN 14 11/05/2023 1116   BUN 10 09/13/2023 0812   CREATININE 0.84 11/05/2023 1116   CREATININE 0.43 (L) 12/31/2013 1542   CALCIUM  9.9 11/05/2023 1116   GFRNONAA >60 11/05/2023 1116   GFRAA >60 12/02/2014 1120   Lab Results  Component Value Date   HGBA1C 4.9 07/01/2023   HGBA1C 4.9 05/05/2018   Lab Results  Component Value Date   INSULIN  7.2 07/01/2023  INSULIN  15.6 03/29/2021   Lab Results  Component Value Date   TSH 0.463 07/01/2023   CBC    Component Value Date/Time   WBC 5.6 11/05/2023 1116   WBC 6.1 04/25/2022 1206   RBC 4.60 11/05/2023 1116   HGB 14.4 11/05/2023 1116   HGB 12.0 09/13/2023 0812   HCT 41.3 11/05/2023 1116   HCT 36.6 09/13/2023 0812   PLT 292 11/05/2023 1116   PLT 222 09/13/2023  0812   MCV 89.8 11/05/2023 1116   MCV 97 09/13/2023 0812   MCH 31.3 11/05/2023 1116   MCHC 34.9 11/05/2023 1116   RDW 11.9 11/05/2023 1116   RDW 14.1 09/13/2023 0812   Iron  Studies    Component Value Date/Time   IRON  68 11/05/2023 1116   IRON  57 07/01/2023 0904   TIBC 347 11/05/2023 1116   TIBC 271 07/01/2023 0904   FERRITIN 31 11/05/2023 1116   FERRITIN 205 (H) 09/13/2023 0812   IRONPCTSAT 20 11/05/2023 1116   IRONPCTSAT 21 07/01/2023 0904   IRONPCTSAT 12 (L) 04/25/2022 1206   Lipid Panel     Component Value Date/Time   CHOL 276 (H) 11/05/2023 1116   CHOL 169 07/01/2023 0904   TRIG 294 (H) 11/05/2023 1116   HDL 57 11/05/2023 1116   HDL 49 07/01/2023 0904   CHOLHDL 4.8 11/05/2023 1116   VLDL 59 (H) 11/05/2023 1116   LDLCALC 160 (H) 11/05/2023 1116   LDLCALC 103 (H) 07/01/2023 0904   LDLDIRECT 236.0 12/22/2019 1131   Hepatic Function Panel     Component Value Date/Time   PROT 7.0 11/05/2023 1116   PROT 6.5 09/13/2023 0812   ALBUMIN 4.5 11/05/2023 1116   ALBUMIN 4.4 09/13/2023 0812   AST 17 11/05/2023 1116   ALT 14 11/05/2023 1116   ALKPHOS 46 11/05/2023 1116   BILITOT 0.3 11/05/2023 1116   BILIDIR 0.0 04/25/2022 1206      Component Value Date/Time   TSH 0.463 07/01/2023 0904   Nutritional Lab Results  Component Value Date   VD25OH 42.87 11/05/2023   VD25OH 69.3 07/01/2023   VD25OH 52.1 09/05/2022     Assessment and Plan Assessment & Plan Obesity and polyphagia Management is ongoing with a focus on dietary adherence and weight loss. She is following the category two eating plan 70% of the time and has lost four pounds in the last month. She is not currently exercising but remains active due to her lifestyle. Concerns about insurance coverage for Zepbound  and potential cost implications were discussed. She is advised against using compounded GLP-1 medications due to lack of oversight and potential risks. Hair thinning is noted, possibly related to low  protein intake or weight loss. - Continue category two eating plan. - Attempt to obtain a 90-day prescription for Zepbound  if possible. - Monitor protein intake and adjust as needed. - Discussed potential insurance changes and explored options for GLP-1 medications.  Psoriatic arthritis Chronic psoriatic arthritis with persistent joint pain despite treatment. She has switched from Cosentyx to Tremfya and is transitioning from Trintellix to duloxetine. Reports significant joint pain, particularly at night, and has tried various treatments without relief. An ultrasound showed minimal inflammation, but pain persists. She is seeking a second opinion due to limited treatment options and ongoing pain. - Referred to Dr. Dolphus in rheumatology for a second opinion. - Continue Tremfya and transition from Trintellix to duloxetine as planned.  Vitamin D  deficiency Well-managed with prescription ergocalciferol . She requests a refill. - Refilled prescription ergocalciferol  50,000 IU  every two weeks. - Continue to monitor vitamin D  levels.     Roxanna was counseled on the importance of maintaining healthy lifestyle habits, including balanced nutrition, regular physical activity, and behavioral modifications, while taking antiobesity medication.  Patient verbalized understanding that medication is an adjunct to, not a replacement for, lifestyle changes and that the long-term success and weight maintenance depend on continued adherence to these strategies.   Yoshi was informed of the importance of frequent follow up visits to maximize her success with intensive lifestyle modifications for her obesity and obesity related health conditions as recommended by USPSTF and CMS guidelines   Louann Penton, MD

## 2023-12-06 ENCOUNTER — Ambulatory Visit: Admitting: Gastroenterology

## 2023-12-06 ENCOUNTER — Encounter: Admitting: Gastroenterology

## 2023-12-16 ENCOUNTER — Encounter: Payer: Self-pay | Admitting: Neurology

## 2023-12-16 LAB — GENECONNECT MOLECULAR SCREEN: Genetic Analysis Overall Interpretation: NEGATIVE

## 2023-12-17 ENCOUNTER — Other Ambulatory Visit (HOSPITAL_COMMUNITY): Payer: Self-pay | Admitting: Internal Medicine

## 2023-12-17 DIAGNOSIS — L409 Psoriasis, unspecified: Secondary | ICD-10-CM

## 2023-12-17 DIAGNOSIS — L4050 Arthropathic psoriasis, unspecified: Secondary | ICD-10-CM

## 2023-12-23 ENCOUNTER — Ambulatory Visit (INDEPENDENT_AMBULATORY_CARE_PROVIDER_SITE_OTHER): Admitting: Family Medicine

## 2023-12-24 ENCOUNTER — Other Ambulatory Visit (HOSPITAL_COMMUNITY): Payer: Self-pay | Admitting: Internal Medicine

## 2023-12-24 DIAGNOSIS — L4050 Arthropathic psoriasis, unspecified: Secondary | ICD-10-CM

## 2023-12-24 DIAGNOSIS — L409 Psoriasis, unspecified: Secondary | ICD-10-CM

## 2023-12-24 DIAGNOSIS — L4059 Other psoriatic arthropathy: Secondary | ICD-10-CM

## 2023-12-26 ENCOUNTER — Ambulatory Visit (HOSPITAL_COMMUNITY)
Admission: RE | Admit: 2023-12-26 | Discharge: 2023-12-26 | Disposition: A | Discharge status: Home / Self Care | Source: Ambulatory Visit | Attending: Internal Medicine | Admitting: Internal Medicine

## 2023-12-26 ENCOUNTER — Ambulatory Visit (HOSPITAL_COMMUNITY): Admission: RE | Admit: 2023-12-26 | Discharge: 2023-12-26 | Attending: Internal Medicine | Admitting: Internal Medicine

## 2023-12-26 DIAGNOSIS — L4050 Arthropathic psoriasis, unspecified: Secondary | ICD-10-CM | POA: Insufficient documentation

## 2023-12-26 DIAGNOSIS — L409 Psoriasis, unspecified: Secondary | ICD-10-CM

## 2023-12-26 DIAGNOSIS — L4059 Other psoriatic arthropathy: Secondary | ICD-10-CM | POA: Diagnosis present

## 2023-12-31 ENCOUNTER — Encounter (INDEPENDENT_AMBULATORY_CARE_PROVIDER_SITE_OTHER): Payer: Self-pay | Admitting: Family Medicine

## 2024-01-01 ENCOUNTER — Ambulatory Visit: Admitting: Neurology

## 2024-01-01 NOTE — Telephone Encounter (Signed)
 Please advise

## 2024-01-03 ENCOUNTER — Other Ambulatory Visit (INDEPENDENT_AMBULATORY_CARE_PROVIDER_SITE_OTHER): Payer: Self-pay | Admitting: Family Medicine

## 2024-01-03 DIAGNOSIS — R632 Polyphagia: Secondary | ICD-10-CM

## 2024-01-03 MED ORDER — TIRZEPATIDE-WEIGHT MANAGEMENT 12.5 MG/0.5ML ~~LOC~~ SOAJ: 12.5000 mg | SUBCUTANEOUS | 0 refills | Status: AC

## 2024-01-14 ENCOUNTER — Ambulatory Visit (INDEPENDENT_AMBULATORY_CARE_PROVIDER_SITE_OTHER): Payer: Self-pay | Admitting: Family Medicine

## 2024-01-14 ENCOUNTER — Encounter (INDEPENDENT_AMBULATORY_CARE_PROVIDER_SITE_OTHER): Payer: Self-pay | Admitting: Family Medicine

## 2024-01-14 VITALS — BP 112/74 | HR 99 | Temp 98.2°F | Ht 65.5 in | Wt 177.0 lb

## 2024-01-14 DIAGNOSIS — E66813 Obesity, class 3: Secondary | ICD-10-CM | POA: Diagnosis not present

## 2024-01-14 DIAGNOSIS — Z9884 Bariatric surgery status: Secondary | ICD-10-CM | POA: Insufficient documentation

## 2024-01-14 DIAGNOSIS — R197 Diarrhea, unspecified: Secondary | ICD-10-CM

## 2024-01-14 DIAGNOSIS — E7800 Pure hypercholesterolemia, unspecified: Secondary | ICD-10-CM

## 2024-01-14 DIAGNOSIS — M791 Myalgia, unspecified site: Secondary | ICD-10-CM | POA: Diagnosis not present

## 2024-01-14 DIAGNOSIS — T39395A Adverse effect of other nonsteroidal anti-inflammatory drugs [NSAID], initial encounter: Secondary | ICD-10-CM

## 2024-01-14 DIAGNOSIS — Z6829 Body mass index (BMI) 29.0-29.9, adult: Secondary | ICD-10-CM | POA: Diagnosis not present

## 2024-01-14 DIAGNOSIS — E46 Unspecified protein-calorie malnutrition: Secondary | ICD-10-CM

## 2024-01-14 DIAGNOSIS — E441 Mild protein-calorie malnutrition: Secondary | ICD-10-CM

## 2024-01-14 DIAGNOSIS — R632 Polyphagia: Secondary | ICD-10-CM | POA: Diagnosis not present

## 2024-01-14 DIAGNOSIS — Z6841 Body Mass Index (BMI) 40.0 and over, adult: Secondary | ICD-10-CM

## 2024-01-14 NOTE — Progress Notes (Signed)
 "  Office: [phone]  /  Fax: [phone]  WEIGHT SUMMARY AND BIOMETRICS  Anthropometric Measurements Height: 5' 5.5 (1.664 m) Weight: 177 lb (80.3 kg) BMI (Calculated): 29 Weight at Last Visit: 181lb Weight Lost Since Last Visit: 4lb Weight Gained Since Last Visit: 0lb Starting Weight: 250lb Total Weight Loss (lbs): 73 lb (33.1 kg) Peak Weight: 260lb   Body Composition  Body Fat %: 38 % Fat Mass (lbs): 67.4 lbs Muscle Mass (lbs): 104.4 lbs Total Body Water  (lbs): 79.8 lbs Visceral Fat Rating : 7   Other Clinical Data Fasting: No Labs: no Today's Visit #: 33 Starting Date: 03/29/21    Chief Complaint: OBESITY    History of Present Illness Charlene [name] is a 43 year old female who presents for obesity treatment and progress assessment.  She has been adhering to the category three eating plan about sixty percent of the time and struggles to consume adequate protein, fruits, and vegetables. She is not currently exercising but has lost four pounds in the last six weeks, including over Thanksgiving and Christmas.  She has a history of laparoscopic gastric sleeve surgery in 2016 and is diagnosed with polyphagia, for which she is prescribed Zepbound  12.5 mg weekly. She reports a vitamin D  deficiency and takes prescription vitamin D  50,000 IU of ergocalciferol  every two weeks, with her level close to goal at 42 in October. She has hyperlipidemia and is not on a statin due to past intolerance and statin-induced myalgias.  She has been experiencing severe diarrhea for two weeks since starting Celebrex, with episodes occurring throughout the day and night, accompanied by cramping. The diarrhea is described as 'complete liquid' and 'explosive,' with no improvement. No fever is present. The diarrhea impacts her sleep and daily activities, causing embarrassment and concern about electrolyte imbalances. She has been trying to manage it with the BRAT diet, Pepto, and loratadine, but  without significant relief. She stays hydrated with water  and Gatorade Zero.  She has a history of osteoarthritis and psoriatic arthritis, confirmed by MRI findings, and Celebrex is the first medication that has provided relief besides steroids. Despite the diarrhea, she has not experienced nausea and has been cautious with her diet to avoid exacerbating symptoms. She skipped her Zepbound  and Repatha  shots last week due to concerns about worsening her condition.  She is concerned about muscle loss and malnourishment due to the diarrhea, noting significant muscle mass decrease and hair loss, which she attributes to her nutritional status.      PHYSICAL EXAM:  Blood pressure 112/74, pulse 99, temperature 98.2 F (36.8 C), height 5' 5.5 (1.664 m), weight 177 lb (80.3 kg), SpO2 99%. Body mass index is 29.01 kg/m.  DIAGNOSTIC DATA REVIEWED:  BMET    Component Value Date/Time   NA 139 11/05/2023 1116   NA 137 09/13/2023 0812   K 4.6 11/05/2023 1116   CL 105 11/05/2023 1116   CO2 29 11/05/2023 1116   GLUCOSE 84 11/05/2023 1116   GLUCOSE 93 11/24/2013 1126   BUN 14 11/05/2023 1116   BUN 10 09/13/2023 0812   CREATININE 0.84 11/05/2023 1116   CREATININE 0.43 (L) 12/31/2013 1542   CALCIUM  9.9 11/05/2023 1116   GFRNONAA >60 11/05/2023 1116   GFRAA >60 12/02/2014 1120   Lab Results  Component Value Date   HGBA1C 4.9 07/01/2023   HGBA1C 4.9 05/05/2018   Lab Results  Component Value Date   INSULIN  7.2 07/01/2023   INSULIN  15.6 03/29/2021   Lab Results  Component Value  Date   TSH 0.463 07/01/2023   CBC    Component Value Date/Time   WBC 5.6 11/05/2023 1116   WBC 6.1 04/25/2022 1206   RBC 4.60 11/05/2023 1116   HGB 14.4 11/05/2023 1116   HGB 12.0 09/13/2023 0812   HCT 41.3 11/05/2023 1116   HCT 36.6 09/13/2023 0812   PLT 292 11/05/2023 1116   PLT 222 09/13/2023 0812   MCV 89.8 11/05/2023 1116   MCV 97 09/13/2023 0812   MCH 31.3 11/05/2023 1116   MCHC 34.9 11/05/2023  1116   RDW 11.9 11/05/2023 1116   RDW 14.1 09/13/2023 0812   Iron  Studies    Component Value Date/Time   IRON  68 11/05/2023 1116   IRON  57 07/01/2023 0904   TIBC 347 11/05/2023 1116   TIBC 271 07/01/2023 0904   FERRITIN 31 11/05/2023 1116   FERRITIN 205 (H) 09/13/2023 0812   IRONPCTSAT 20 11/05/2023 1116   IRONPCTSAT 21 07/01/2023 0904   IRONPCTSAT 12 (L) 04/25/2022 1206   Lipid Panel     Component Value Date/Time   CHOL 276 (H) 11/05/2023 1116   CHOL 169 07/01/2023 0904   TRIG 294 (H) 11/05/2023 1116   HDL 57 11/05/2023 1116   HDL 49 07/01/2023 0904   CHOLHDL 4.8 11/05/2023 1116   VLDL 59 (H) 11/05/2023 1116   LDLCALC 160 (H) 11/05/2023 1116   LDLCALC 103 (H) 07/01/2023 0904   LDLDIRECT 236.0 12/22/2019 1131   Hepatic Function Panel     Component Value Date/Time   PROT 7.0 11/05/2023 1116   PROT 6.5 09/13/2023 0812   ALBUMIN 4.5 11/05/2023 1116   ALBUMIN 4.4 09/13/2023 0812   AST 17 11/05/2023 1116   ALT 14 11/05/2023 1116   ALKPHOS 46 11/05/2023 1116   BILITOT 0.3 11/05/2023 1116   BILIDIR 0.0 04/25/2022 1206      Component Value Date/Time   TSH 0.463 07/01/2023 0904   Nutritional Lab Results  Component Value Date   VD25OH 42.87 11/05/2023   VD25OH 69.3 07/01/2023   VD25OH 52.1 09/05/2022     Assessment and Plan Assessment & Plan Class 3 obesity She has lost four pounds in the last six weeks, including over Thanksgiving and Christmas. She is following the category three eating plan about sixty percent of the time but struggles with adequate protein intake and consuming more fruits and vegetables. She is not currently exercising. - Continue category three eating plan - Encouraged increased protein intake - Encouraged consumption of more fruits and vegetables - Encouraged resumption of exercise once diarrhea resolves  Status post laparoscopic sleeve gastrectomy Underwent laparoscopic sleeve gastrectomy in 2016. Current weight loss and dietary  challenges are being managed in the context of this history.  Polyphagia Managed with Zepbound  12.5 mg per week. -Continue Zepbound  as prescribed - Continue diet, exercise and weight loss as discussed today as an important part of the treatment plan   Protein-calorie malnutrition Experiencing protein-calorie malnutrition, likely exacerbated by recent watery diarrhea and dietary challenges. Muscle mass is decreasing, and there is concern about malnourishment. - Encouraged protein intake through protein shakes or protein water  - Advised to avoid dairy-based protein shakes if intolerant - Advised to monitor nutritional status and adjust diet as needed  Watery diarrhea Watery diarrhea has persisted for two weeks since starting Celebrex, with episodes occurring multiple times a day and at night. The diarrhea is severe, with cramping and urgency, and has not improved despite dietary modifications. There is concern for potential electrolyte imbalances due to  the severity and duration of symptoms. Celebrex is suspected as the cause, but other causes such as C. diff or other GI pathogens are also considered. - Ordered lab tests to check electrolytes, kidney function, and hydration status - Advised to seek medical attention within 24 hours if electrolytes are abnormal - Continue hydration with Gatorade Zero - Consider alternative NSAIDs if diarrhea persists  Hypercholesterolemia Managed with diet, exercise, and weight loss. She is not on a statin due to statin intolerance and myalgias. - Continue diet, exercise and weight loss as discussed today as an important part of the treatment plan   Statin-induced myalgia Statin intolerance and myalgias have led to avoidance of statin therapy.  Vitamin D  deficiency Managed with ergocalciferol  50,000 IU every two weeks. Vitamin D  level was close to goal at 42 in October. - Continue ergocalciferol  50,000 IU every two weeks      Patients who are on  anti-obesity medications are counseled on the importance of maintaining healthy lifestyle habits, including balanced nutrition, regular physical activity, and behavioral modifications,  Medication is an adjunct to, not a replacement for, lifestyle changes and that the long-term success and weight maintenance depend on continued adherence to these strategies.   Tinita was informed of the importance of frequent follow up visits to maximize her success with intensive lifestyle modifications for her obesity and obesity related health conditions as recommended by USPSTF and CMS guidelines  Louann Penton, MD   "

## 2024-01-15 ENCOUNTER — Ambulatory Visit (INDEPENDENT_AMBULATORY_CARE_PROVIDER_SITE_OTHER): Payer: Self-pay | Admitting: Family Medicine

## 2024-01-15 ENCOUNTER — Encounter: Payer: Self-pay | Admitting: Neurology

## 2024-01-15 LAB — CMP14+EGFR
ALT: 15 IU/L (ref 0–32)
AST: 17 IU/L (ref 0–40)
Albumin: 4.1 g/dL (ref 3.9–4.9)
Alkaline Phosphatase: 60 IU/L (ref 41–116)
BUN/Creatinine Ratio: 20 (ref 9–23)
BUN: 13 mg/dL (ref 6–24)
Bilirubin Total: 0.2 mg/dL (ref 0.0–1.2)
CO2: 20 mmol/L (ref 20–29)
Calcium: 9 mg/dL (ref 8.7–10.2)
Chloride: 107 mmol/L — ABNORMAL HIGH (ref 96–106)
Creatinine, Ser: 0.66 mg/dL (ref 0.57–1.00)
Globulin, Total: 1.9 g/dL (ref 1.5–4.5)
Glucose: 105 mg/dL — ABNORMAL HIGH (ref 70–99)
Potassium: 3.8 mmol/L (ref 3.5–5.2)
Sodium: 143 mmol/L (ref 134–144)
Total Protein: 6 g/dL (ref 6.0–8.5)
eGFR: 112 mL/min/1.73

## 2024-01-22 ENCOUNTER — Encounter: Payer: Self-pay | Admitting: Neurology

## 2024-01-22 ENCOUNTER — Telehealth: Payer: Self-pay

## 2024-01-22 NOTE — Telephone Encounter (Addendum)
 Auth Submission: APPROVED Site of care: Site of care: [facility] Payer: [employer] commercial Medication & CPT/J Code(s) submitted: Ocrevus  (Ocrelizumab ) J2350 Diagnosis Code:  Route of submission (phone, fax, portal): phone Copywriter, [employer] Phone # [phone] Fax # [phone] Auth type: Buy/Bill PB Units/visits requested: 600mg  x 2 doses Reference number: [id number] Approval from: 01/22/24 to 01/21/25

## 2024-01-27 ENCOUNTER — Encounter: Admitting: Gastroenterology

## 2024-01-31 ENCOUNTER — Ambulatory Visit

## 2024-01-31 VITALS — BP 141/87 | HR 89 | Temp 98.9°F | Resp 16 | Ht 65.5 in | Wt 178.0 lb

## 2024-01-31 DIAGNOSIS — G35A Relapsing-remitting multiple sclerosis: Secondary | ICD-10-CM

## 2024-01-31 MED ORDER — SODIUM CHLORIDE 0.9 % IV SOLN
600.0000 mg | Freq: Once | INTRAVENOUS | Status: AC
  Administered 2024-01-31: 600 mg via INTRAVENOUS
  Filled 2024-01-31: qty 20

## 2024-01-31 MED ORDER — ACETAMINOPHEN 325 MG PO TABS
650.0000 mg | ORAL_TABLET | Freq: Once | ORAL | Status: AC
  Administered 2024-01-31: 650 mg via ORAL
  Filled 2024-01-31: qty 2

## 2024-01-31 MED ORDER — METHYLPREDNISOLONE SODIUM SUCC 125 MG IJ SOLR
125.0000 mg | Freq: Once | INTRAMUSCULAR | Status: AC
  Administered 2024-01-31: 125 mg via INTRAVENOUS
  Filled 2024-01-31: qty 2

## 2024-01-31 MED ORDER — DIPHENHYDRAMINE HCL 25 MG PO CAPS
50.0000 mg | ORAL_CAPSULE | Freq: Once | ORAL | Status: AC
  Administered 2024-01-31: 50 mg via ORAL
  Filled 2024-01-31: qty 2

## 2024-01-31 NOTE — Progress Notes (Signed)
 Diagnosis:  Multiple Sclerosis  Provider:  Lonna Coder MD  Procedure: IV Infusion  IV Type: Peripheral, IV Location: R Antecubital   Ocrevus  (Ocrelizumab ), Dose: 600 mg  Infusion Start Time: 1141  Infusion Stop Time: 1358  Post Infusion IV Care: Peripheral IV Discontinued  Discharge: Condition: Good, Destination: Home . AVS Declined  Performed by:  Rocky FORBES Search, RN

## 2024-02-05 NOTE — Progress Notes (Unsigned)
  [facility] OFFICE PROGRESS NOTE  [name], MD [address] [place] [address]  DIAGNOSIS: Iron  deficiency anemia secondary to history of gastric ulcer and hemorrhoids     PRIOR THERAPY: Iron  infusion with Venofer  300 Mg IV weekly for 3 weeks most recent dose on 09/11/23   CURRENT THERAPY: Over-the-counter ferrous sulfate  with vitamin C vs multivitamin   INTERVAL HISTORY: Charlene [name] 44 y.o. female returns to clinic today as a follow-up visit.  The patient is followed for her iron  deficiency anemia.  The patient prefers short interval follow-ups due to being symptomatic from anemia.  She was last seen in clinic on 10/25/2019/25.  She initially was felt to have iron  deficiency secondary to hemorrhoids which have since been banded.    There was a period of time over the summer where she had a drop in her hemoglobin.  Of note she was taking high-dose NSAIDs 800 mg 3 times daily due to having significant pain.  She also has been taking Goody powders.  There was concern for stomach ulcer at that time and gastritis.  Her ferritin was also elevated at that time.  She unfortunately experiences chronic pain secondary to psoriatic arthritis.  Her stool cards were negative.  She was started on Protonix .  She was post have a colonoscopy but it was canceled due to having a fever.  Reschedule?  Her hemoglobin later had improved.  She takes iron  supplements every few days with vitamin C.  She does not take this consistently due to constipation.  She also takes a multivitamin with iron   She sees rheumatology.  She also periodically has put on prednisone   For pain she has been taking Tylenol  650 and 800 mg of ibuprofen  at nighttime along with topical such as Voltaren.  Her energy is is***.  She denies any palpitations, shortness of breath, lightheadedness, or dizziness.  She denies any abnormal bleeding or bruising.  Epistatic pain?  She is here today for  evaluation and repeat blood work MEDICAL HISTORY: Past Medical History:  Diagnosis Date   Alcoholism (HCC)    Anxiety    Anxiety and depression    Bipolar 2 disorder (HCC)    Borderline personality disorder (HCC)    Chest pain    Chronic fatigue syndrome    Claustrophobia    on meds   Constipation    Depression    on meds   Elevated blood sugar 06/08/2014   Elevated LFTs    Fatigue    Fatty liver 10/2014   GERD (gastroesophageal reflux disease)    diet related   Heart murmur    Hyperlipidemia    on meds   Infertility associated with anovulation    Joint pain    Low grade squamous intraepithelial lesion (LGSIL) on Pap smear 10/30/2011   Major depressive disorder    Migraines    MS (multiple sclerosis)    Neuromuscular disorder (HCC)    Obesity    Obesity in pregnancy, antepartum    PCOS (polycystic ovarian syndrome)    Personal history of pre-term labor    Prediabetes    Pregnancy induced hypertension    pregnancy related- hx of   Psoriasis    Sleep apnea    SOB (shortness of breath)    Vitamin D  deficiency     ALLERGIES:  is allergic to ciprofloxacin, african mango [irvingia gabonensis], atorvastatin , and rosuvastatin .  MEDICATIONS:  Current Outpatient Medications  Medication Sig Dispense Refill  celecoxib (CELEBREX) 200 MG capsule Take 200 mg by mouth daily.     DULoxetine (CYMBALTA) 60 MG capsule Take 60 mg by mouth every morning.     lamoTRIgine (LAMICTAL) 200 MG tablet Take 200 mg by mouth daily. (Patient taking differently: Take 300 mg by mouth daily.)     Multiple Vitamin (MULTIVITAMIN) tablet Take 1 tablet by mouth daily.     pantoprazole  (PROTONIX ) 40 MG tablet TAKE 1 TABLET(40 MG) BY MOUTH DAILY 90 tablet 0   tirzepatide  (ZEPBOUND ) 12.5 MG/0.5ML Pen Inject 12.5 mg into the skin once a week. 6 mL 0   TREMFYA 100 MG/ML prefilled syringe 1 mL Subcutaneous every 8 weeks; Duration: 60 days     TRINTELLIX 10 MG TABS tablet Take 10 mg by mouth daily.  (Patient not taking: Reported on 01/14/2024)     Vitamin D , Ergocalciferol , (DRISDOL ) 1.25 MG (50000 UNIT) CAPS capsule Take 1 capsule (50,000 Units total) by mouth every 14 (fourteen) days. 6 capsule 0   VYVANSE 60 MG capsule Take 60 mg by mouth every morning.     zolmitriptan  (ZOMIG ) 5 MG tablet TAKE ONE TABLET BY MOUTH AS NEEDED FOR MIGRAINES. MAY REPEAT IN TWO HOURS IF NEEDED. MAX 2 TABLETS A DAY OR 4 TABLETS A WEEK 10 tablet 6   No current facility-administered medications for this visit.    SURGICAL HISTORY:  Past Surgical History:  Procedure Laterality Date   COLONOSCOPY  10/17/2020   DILATION AND CURETTAGE OF UTERUS  08/15/2008   DILATION AND EVACUATION  12/29/2011   Procedure: DILATATION AND EVACUATION;  Surgeon: Winton Felt, MD;  Location: [facility];  Service: Gynecology;;  Dr, Edsel transferred case to Dr. Felt   LAPAROSCOPIC GASTRIC SLEEVE RESECTION N/A 12/07/2014   Procedure: LAPAROSCOPIC GASTRIC SLEEVE RESECTION;  Surgeon: Alm Angle, MD;  Location: [facility];  Service: General;  Laterality: N/A;    REVIEW OF SYSTEMS:   Review of Systems  Constitutional: Negative for appetite change, chills, fatigue, fever and unexpected weight change.  HENT:   Negative for mouth sores, nosebleeds, sore throat and trouble swallowing.   Eyes: Negative for eye problems and icterus.  Respiratory: Negative for cough, hemoptysis, shortness of breath and wheezing.   Cardiovascular: Negative for chest pain and leg swelling.  Gastrointestinal: Negative for abdominal pain, constipation, diarrhea, nausea and vomiting.  Genitourinary: Negative for bladder incontinence, difficulty urinating, dysuria, frequency and hematuria.   Musculoskeletal: Negative for back pain, gait problem, neck pain and neck stiffness.  Skin: Negative for itching and rash.  Neurological: Negative for dizziness, extremity weakness, gait problem, headaches, light-headedness and seizures.  Hematological: Negative for  adenopathy. Does not bruise/bleed easily.  Psychiatric/Behavioral: Negative for confusion, depression and sleep disturbance. The patient is not nervous/anxious.     PHYSICAL EXAMINATION:  There were no vitals taken for this visit.  ECOG PERFORMANCE STATUS: {CHL ONC ECOG [id number]  Physical Exam  Constitutional: Oriented to person, place, and time and well-developed, well-nourished, and in no distress. No distress.  HENT:  Head: Normocephalic and atraumatic.  Mouth/Throat: Oropharynx is clear and moist. No oropharyngeal exudate.  Eyes: Conjunctivae are normal. Right eye exhibits no discharge. Left eye exhibits no discharge. No scleral icterus.  Neck: Normal range of motion. Neck supple.  Cardiovascular: Normal rate, regular rhythm, normal heart sounds and intact distal pulses.   Pulmonary/Chest: Effort normal and breath sounds normal. No respiratory distress. No wheezes. No rales.  Abdominal: Soft. Bowel sounds are normal. Exhibits no distension and no mass. There is no  tenderness.  Musculoskeletal: Normal range of motion. Exhibits no edema.  Lymphadenopathy:    No cervical adenopathy.  Neurological: Alert and oriented to person, place, and time. Exhibits normal muscle tone. Gait normal. Coordination normal.  Skin: Skin is warm and dry. No rash noted. Not diaphoretic. No erythema. No pallor.  Psychiatric: Mood, memory and judgment normal.  Vitals reviewed.  LABORATORY DATA: Lab Results  Component Value Date   WBC 5.6 11/05/2023   HGB 14.4 11/05/2023   HCT 41.3 11/05/2023   MCV 89.8 11/05/2023   PLT 292 11/05/2023      Chemistry      Component Value Date/Time   NA 143 01/14/2024 1433   K 3.8 01/14/2024 1433   CL 107 (H) 01/14/2024 1433   CO2 20 01/14/2024 1433   BUN 13 01/14/2024 1433   CREATININE 0.66 01/14/2024 1433   CREATININE 0.84 11/05/2023 1116   CREATININE 0.43 (L) 12/31/2013 1542      Component Value Date/Time   CALCIUM  9.0 01/14/2024 1433   ALKPHOS 60  01/14/2024 1433   AST 17 01/14/2024 1433   AST 17 11/05/2023 1116   ALT 15 01/14/2024 1433   ALT 14 11/05/2023 1116   BILITOT 0.2 01/14/2024 1433   BILITOT 0.3 11/05/2023 1116       RADIOGRAPHIC STUDIES:  No results found.   ASSESSMENT/PLAN:  This is a very pleasant 44 year old Caucasian female with iron  deficiency anemia secondary to GI blood loss.   The patient receives IV iron  as needed with Venofer  the most recent dose being in August 2024.  She is currently taking a iron  supplement a few times a week with vitamin C. She also takes a multivitamin with iron .    She started having new onset anemia in July 2025.  This did improved in August 2025.    Labs from today show normal Hbg of ***. Her iron  studies appear normal too.  Ferritin is pending but it has been elevated at recent appointments which is likely secondary to inflammatory secondary to her autoimmune disorders.   She does not need any IV iron  infusions at this time.   She does want to continue to keep a close eye on her iron  studies and ferritin.  Therefore we will arrange for labs and a follow-up in 3 months.   If her CBC and iron  studies are normal at her next appointment we discussed pushing her appointments farther out.  Continue taking Protonix . She will also reach out to GI about whether or not she needs to reschedule her upper endoscopy.   She will continue taking her iron  supplement a few days a week and her multivitamin with iron .   She will continue to follow with neurology as well as her rheumatologist regarding her MS and psoriasis.  They are waiting for insurance approval for Acthar.will continue to follow them regarding her options for her chronic pain.      The patient was advised to call immediately if she has any concerning symptoms in the interval. The patient voices understanding of current disease status and treatment options and is in agreement with the current care plan. All questions were  answered. The patient knows to call the clinic with any problems, questions or concerns. We can certainly see the patient much sooner if necessary    No orders of the defined types were placed in this encounter.    I spent {CHL ONC TIME VISIT - DTPQU:[id number]} counseling the patient face to face. The total time spent  in the appointment was {CHL ONC TIME VISIT - DTPQU:[id number]}.  Neli Fofana L Sherhonda Gaspar, PA-C 02/05/24

## 2024-02-11 ENCOUNTER — Ambulatory Visit (INDEPENDENT_AMBULATORY_CARE_PROVIDER_SITE_OTHER): Admitting: Family Medicine

## 2024-02-11 ENCOUNTER — Inpatient Hospital Stay: Payer: Self-pay | Admitting: Physician Assistant

## 2024-02-11 ENCOUNTER — Inpatient Hospital Stay: Admitting: Physician Assistant

## 2024-02-11 ENCOUNTER — Inpatient Hospital Stay

## 2024-02-11 ENCOUNTER — Inpatient Hospital Stay: Payer: Self-pay

## 2024-02-19 ENCOUNTER — Encounter: Payer: Self-pay | Admitting: Gastroenterology

## 2024-02-19 ENCOUNTER — Ambulatory Visit (AMBULATORY_SURGERY_CENTER): Admitting: Gastroenterology

## 2024-02-19 VITALS — BP 133/98 | HR 84 | Temp 98.1°F | Resp 16 | Ht 65.5 in | Wt 193.0 lb

## 2024-02-19 DIAGNOSIS — D500 Iron deficiency anemia secondary to blood loss (chronic): Secondary | ICD-10-CM

## 2024-02-19 DIAGNOSIS — K449 Diaphragmatic hernia without obstruction or gangrene: Secondary | ICD-10-CM | POA: Diagnosis not present

## 2024-02-19 DIAGNOSIS — D509 Iron deficiency anemia, unspecified: Secondary | ICD-10-CM

## 2024-02-19 DIAGNOSIS — K2100 Gastro-esophageal reflux disease with esophagitis, without bleeding: Secondary | ICD-10-CM | POA: Diagnosis not present

## 2024-02-19 MED ORDER — PANTOPRAZOLE SODIUM 20 MG PO TBEC: 20.0000 mg | DELAYED_RELEASE_TABLET | Freq: Every day | ORAL | 3 refills | Status: AC

## 2024-02-19 MED ORDER — SODIUM CHLORIDE 0.9 % IV SOLN: 500.0000 mL | Freq: Once | INTRAVENOUS | Status: DC

## 2024-02-19 NOTE — Op Note (Signed)
  [facility] Patient Name: Charlene [name] Procedure Date: 02/19/2024 8:29 AM MRN: [id number] Endoscopist: Gustav ALONSO Mcgee , MD, [id number] Age: 44 Referring MD:  Date of Birth: August 19, 1980 Gender: Female Account #: [id number] Procedure:                Upper GI endoscopy Indications:              Iron  deficiency anemia due to suspected upper                            gastrointestinal bleeding, Follow-up of reflux                            esophagitis Medicines:                Monitored Anesthesia Care Procedure:                Pre-Anesthesia Assessment:                           - Prior to the procedure, a History and Physical                            was performed, and patient medications and                            allergies were reviewed. The patient's tolerance of                            previous anesthesia was also reviewed. The risks                            and benefits of the procedure and the sedation                            options and risks were discussed with the patient.                            All questions were answered, and informed consent                            was obtained. Prior Anticoagulants: The patient has                            taken no anticoagulant or antiplatelet agents. ASA                            Grade Assessment: II - A patient with mild systemic                            disease. After reviewing the risks and benefits,                            the patient was deemed in satisfactory condition to  undergo the procedure.                           After obtaining informed consent, the endoscope was                            passed under direct vision. Throughout the                            procedure, the patient's blood pressure, pulse, and                            oxygen saturations were monitored continuously. The                            GIF [id number] #[id number] was introduced through  the                            mouth, and advanced to the second part of duodenum.                            The upper GI endoscopy was accomplished without                            difficulty. The patient tolerated the procedure                            well. Scope In: Scope Out: Findings:                 The Z-line was regular and was found 36 cm from the                            incisors.                           No gross lesions were noted in the entire esophagus.                           A 2 cm hiatal hernia was present.                           A medium amount of food (residue) was found in the                            gastric antrum.                           The exam of the stomach was otherwise normal.                           The examined duodenum was normal. Complications:            No immediate complications. Estimated Blood Loss:     Estimated blood loss was minimal. Impression:               -  Z-line regular, 36 cm from the incisors.                           - No gross lesions in the entire esophagus.                           - 2 cm hiatal hernia.                           - A medium amount of food (residue) in the stomach.                           - Normal examined duodenum.                           - No specimens collected. Recommendation:           - Resume previous diet.                           - Continue present medications.                           - Follow an antireflux regimen.                           - Use Protonix  (pantoprazole ) 20 mg PO daily. Rx                            for 90 days with 3 refills                           - To visualize the small bowel, perform video                            capsule endoscopy at appointment to be scheduled                            for further evaluation of iron  deficiency anemia. Seara Hinesley V. Marylynn Rigdon, MD 02/19/2024 9:59:54 AM This report has been signed electronically.

## 2024-02-19 NOTE — Patient Instructions (Addendum)
 - Resume previous diet. - Continue present medications. - Follow an antireflux regimen. - Use Protonix  (pantoprazole ) 20 mg PO daily. Rx      for 90 days with 3 refills - To visualize the small bowel, perform video     capsule endoscopy at appointment to be scheduled. The office will call you to schedule.     for further evaluation of iron  deficiency anemia Your instructions for your miralax prep were given to you. Be aware that the date and time will depend upon when it is scheduled.     YOU HAD AN ENDOSCOPIC PROCEDURE TODAY AT THE [facility]:   Refer to the procedure report that was given to you for any specific questions about what was found during the examination.  If the procedure report does not answer your questions, please call your gastroenterologist to clarify.  If you requested that your care partner not be given the details of your procedure findings, then the procedure report has been included in a sealed envelope for you to review at your convenience later.  YOU SHOULD EXPECT: Some feelings of bloating in the abdomen. Passage of more gas than usual.  Walking can help get rid of the air that was put into your GI tract during the procedure and reduce the bloating. If you had a lower endoscopy (such as a colonoscopy or flexible sigmoidoscopy) you may notice spotting of blood in your stool or on the toilet paper. If you underwent a bowel prep for your procedure, you may not have a normal bowel movement for a few days.  Please Note:  You might notice some irritation and congestion in your nose or some drainage.  This is from the oxygen used during your procedure.  There is no need for concern and it should clear up in a day or so.  SYMPTOMS TO REPORT IMMEDIATELY:   Following upper endoscopy (EGD)  Vomiting of blood or coffee ground material  New chest pain or pain under the shoulder blades  Painful or persistently difficult swallowing  New shortness of breath  Fever  of 100F or higher  Black, tarry-looking stools  For urgent or emergent issues, a gastroenterologist can be reached at any hour by calling [phone]. Do not use MyChart messaging for urgent concerns.    DIET:  We do recommend a small meal at first, but then you may proceed to your regular diet.  Drink plenty of fluids but you should avoid alcoholic beverages for 24 hours.  ACTIVITY:  You should plan to take it easy for the rest of today and you should NOT DRIVE or use heavy machinery until tomorrow (because of the sedation medicines used during the test).    FOLLOW UP: Our staff will call the number listed on your records the next business day following your procedure.  We will call around 7:15- 8:00 am to check on you and address any questions or concerns that you may have regarding the information given to you following your procedure. If we do not reach you, we will leave a message.     If any biopsies were taken you will be contacted by phone or by letter within the next 1-3 weeks.  Please call us  at [phone] if you have not heard about the biopsies in 3 weeks.    SIGNATURES/CONFIDENTIALITY: You and/or your care partner have signed paperwork which will be entered into your electronic medical record.  These signatures attest to the fact that that the  information above on your After Visit Summary has been reviewed and is understood.  Full responsibility of the confidentiality of this discharge information lies with you and/or your care-partner.

## 2024-02-19 NOTE — Progress Notes (Unsigned)
 [facility] OFFICE PROGRESS NOTE  [name], MD [phone] [place] [address]  DIAGNOSIS: Iron  deficiency anemia secondary to history of gastric ulcer and hemorrhoids     PRIOR THERAPY: Iron  infusion with Venofer  300 Mg IV weekly for 3 weeks most recent dose on 09/11/23   CURRENT THERAPY: Over-the-counter ferrous sulfate  with vitamin C vs multivitamin   INTERVAL HISTORY: Lissett Favorite 44 y.o. female returns to clinic today as a follow-up visit.  The patient is followed for her iron  deficiency anemia.  The patient prefers short interval follow-ups due to being symptomatic from anemia.  She was last seen in clinic on 10/25/2019/25.  She initially was felt to have iron  deficiency secondary to hemorrhoids which have since been banded.    There was a period of time over the summer where she had a drop in her hemoglobin.  Of note she was taking high-dose NSAIDs 800 mg 3 times daily due to having significant pain.  She also has been taking Goody powders.  There was concern for stomach ulcer at that time and gastritis.  Her ferritin was also elevated at that time.  She unfortunately experiences chronic pain secondary to psoriatic arthritis.  Her stool cards were negative.  She was started on Protonix .  She was post have a colonoscopy but it was canceled due to having a fever.  Reschedule?  Her hemoglobin later had improved.  She takes iron  supplements every few days with vitamin C.  She does not take this consistently due to constipation.  She also takes a multivitamin with iron   She sees rheumatology.  She also periodically has put on prednisone   For pain she has been taking Tylenol  650 and 800 mg of ibuprofen  at nighttime along with topical such as Voltaren.  Her energy is is***.  She denies any palpitations, shortness of breath, lightheadedness, or dizziness.  She denies any abnormal bleeding or bruising.  Epistatic pain?  She is here today for  evaluation and repeat blood work MEDICAL HISTORY: Past Medical History:  Diagnosis Date   Alcoholism (HCC)    Anxiety    Anxiety and depression    Bipolar 2 disorder (HCC)    Borderline personality disorder (HCC)    Chest pain    Chronic fatigue syndrome    Claustrophobia    on meds   Constipation    Depression    on meds   Elevated blood sugar 06/08/2014   Elevated LFTs    Fatigue    Fatty liver 10/2014   GERD (gastroesophageal reflux disease)    diet related   Heart murmur    Hyperlipidemia    on meds   Infertility associated with anovulation    Joint pain    Low grade squamous intraepithelial lesion (LGSIL) on Pap smear 10/30/2011   Major depressive disorder    Migraines    MS (multiple sclerosis)    Neuromuscular disorder (HCC)    Obesity    Obesity in pregnancy, antepartum    PCOS (polycystic ovarian syndrome)    Personal history of pre-term labor    Prediabetes    Pregnancy induced hypertension    pregnancy related- hx of   Psoriasis    Sleep apnea    SOB (shortness of breath)    Vitamin D  deficiency     ALLERGIES:  is allergic to ciprofloxacin, african mango [irvingia gabonensis], atorvastatin , and rosuvastatin .  MEDICATIONS:  Current Outpatient Medications  Medication Sig Dispense Refill  celecoxib (CELEBREX) 200 MG capsule Take 200 mg by mouth daily.     DULoxetine (CYMBALTA) 60 MG capsule Take 60 mg by mouth every morning.     lamoTRIgine (LAMICTAL) 200 MG tablet Take 200 mg by mouth daily. (Patient taking differently: Take 300 mg by mouth daily.)     Multiple Vitamin (MULTIVITAMIN) tablet Take 1 tablet by mouth daily.     pantoprazole  (PROTONIX ) 40 MG tablet TAKE 1 TABLET(40 MG) BY MOUTH DAILY 90 tablet 0   tirzepatide  (ZEPBOUND ) 12.5 MG/0.5ML Pen Inject 12.5 mg into the skin once a week. 6 mL 0   TREMFYA 100 MG/ML prefilled syringe 1 mL Subcutaneous every 8 weeks; Duration: 60 days     TRINTELLIX 10 MG TABS tablet Take 10 mg by mouth daily.  (Patient not taking: Reported on 01/14/2024)     Vitamin D , Ergocalciferol , (DRISDOL ) 1.25 MG (50000 UNIT) CAPS capsule Take 1 capsule (50,000 Units total) by mouth every 14 (fourteen) days. 6 capsule 0   VYVANSE 60 MG capsule Take 60 mg by mouth every morning.     zolmitriptan  (ZOMIG ) 5 MG tablet TAKE ONE TABLET BY MOUTH AS NEEDED FOR MIGRAINES. MAY REPEAT IN TWO HOURS IF NEEDED. MAX 2 TABLETS A DAY OR 4 TABLETS A WEEK 10 tablet 6   No current facility-administered medications for this visit.    SURGICAL HISTORY:  Past Surgical History:  Procedure Laterality Date   COLONOSCOPY  10/17/2020   DILATION AND CURETTAGE OF UTERUS  08/15/2008   DILATION AND EVACUATION  12/29/2011   Procedure: DILATATION AND EVACUATION;  Surgeon: Winton Felt, MD;  Location: [facility];  Service: Gynecology;;  Dr, Edsel transferred case to Dr. Felt   LAPAROSCOPIC GASTRIC SLEEVE RESECTION N/A 12/07/2014   Procedure: LAPAROSCOPIC GASTRIC SLEEVE RESECTION;  Surgeon: Alm Angle, MD;  Location: [facility];  Service: General;  Laterality: N/A;    REVIEW OF SYSTEMS:   Review of Systems  Constitutional: Negative for appetite change, chills, fatigue, fever and unexpected weight change.  HENT:   Negative for mouth sores, nosebleeds, sore throat and trouble swallowing.   Eyes: Negative for eye problems and icterus.  Respiratory: Negative for cough, hemoptysis, shortness of breath and wheezing.   Cardiovascular: Negative for chest pain and leg swelling.  Gastrointestinal: Negative for abdominal pain, constipation, diarrhea, nausea and vomiting.  Genitourinary: Negative for bladder incontinence, difficulty urinating, dysuria, frequency and hematuria.   Musculoskeletal: Negative for back pain, gait problem, neck pain and neck stiffness.  Skin: Negative for itching and rash.  Neurological: Negative for dizziness, extremity weakness, gait problem, headaches, light-headedness and seizures.  Hematological: Negative for  adenopathy. Does not bruise/bleed easily.  Psychiatric/Behavioral: Negative for confusion, depression and sleep disturbance. The patient is not nervous/anxious.     PHYSICAL EXAMINATION:  There were no vitals taken for this visit.  ECOG PERFORMANCE STATUS: {CHL ONC ECOG [id number]  Physical Exam  Constitutional: Oriented to person, place, and time and well-developed, well-nourished, and in no distress. No distress.  HENT:  Head: Normocephalic and atraumatic.  Mouth/Throat: Oropharynx is clear and moist. No oropharyngeal exudate.  Eyes: Conjunctivae are normal. Right eye exhibits no discharge. Left eye exhibits no discharge. No scleral icterus.  Neck: Normal range of motion. Neck supple.  Cardiovascular: Normal rate, regular rhythm, normal heart sounds and intact distal pulses.   Pulmonary/Chest: Effort normal and breath sounds normal. No respiratory distress. No wheezes. No rales.  Abdominal: Soft. Bowel sounds are normal. Exhibits no distension and no mass. There is no  tenderness.  Musculoskeletal: Normal range of motion. Exhibits no edema.  Lymphadenopathy:    No cervical adenopathy.  Neurological: Alert and oriented to person, place, and time. Exhibits normal muscle tone. Gait normal. Coordination normal.  Skin: Skin is warm and dry. No rash noted. Not diaphoretic. No erythema. No pallor.  Psychiatric: Mood, memory and judgment normal.  Vitals reviewed.  LABORATORY DATA: Lab Results  Component Value Date   WBC 5.6 11/05/2023   HGB 14.4 11/05/2023   HCT 41.3 11/05/2023   MCV 89.8 11/05/2023   PLT 292 11/05/2023      Chemistry      Component Value Date/Time   NA 143 01/14/2024 1433   K 3.8 01/14/2024 1433   CL 107 (H) 01/14/2024 1433   CO2 20 01/14/2024 1433   BUN 13 01/14/2024 1433   CREATININE 0.66 01/14/2024 1433   CREATININE 0.84 11/05/2023 1116   CREATININE 0.43 (L) 12/31/2013 1542      Component Value Date/Time   CALCIUM  9.0 01/14/2024 1433   ALKPHOS 60  01/14/2024 1433   AST 17 01/14/2024 1433   AST 17 11/05/2023 1116   ALT 15 01/14/2024 1433   ALT 14 11/05/2023 1116   BILITOT 0.2 01/14/2024 1433   BILITOT 0.3 11/05/2023 1116       RADIOGRAPHIC STUDIES:  No results found.   ASSESSMENT/PLAN:  This is a very pleasant 44 year old Caucasian female with iron  deficiency anemia secondary to GI blood loss.   The patient receives IV iron  as needed with Venofer  the most recent dose being in August 2024.  She is currently taking a iron  supplement a few times a week with vitamin C. She also takes a multivitamin with iron .    She started having new onset anemia in July 2025.  This did improved in August 2025.    Labs from today show normal Hbg of ***. Her iron  studies appear normal too.  Ferritin is pending but it has been elevated at recent appointments which is likely secondary to inflammatory secondary to her autoimmune disorders.   She does not need any IV iron  infusions at this time.   She does want to continue to keep a close eye on her iron  studies and ferritin.  Therefore we will arrange for labs and a follow-up in 3 months.   If her CBC and iron  studies are normal at her next appointment we discussed pushing her appointments farther out.  Continue taking Protonix . She will also reach out to GI about whether or not she needs to reschedule her upper endoscopy.   She will continue taking her iron  supplement a few days a week and her multivitamin with iron .   She will continue to follow with neurology as well as her rheumatologist regarding her MS and psoriasis.  They are waiting for insurance approval for Acthar.will continue to follow them regarding her options for her chronic pain.      The patient was advised to call immediately if she has any concerning symptoms in the interval. The patient voices understanding of current disease status and treatment options and is in agreement with the current care plan. All questions were  answered. The patient knows to call the clinic with any problems, questions or concerns. We can certainly see the patient much sooner if necessary    No orders of the defined types were placed in this encounter.    I spent {CHL ONC TIME VISIT - DTPQU:[id number]} counseling the patient face to face. The total time spent  in the appointment was {CHL ONC TIME VISIT - DTPQU:[id number]}.  Syan Cullimore L Saul Dorsi, PA-C 02/19/24

## 2024-02-19 NOTE — Progress Notes (Signed)
 [facility] History and Physical   Primary Care Physician:  [name], MD   Reason for Procedure:  Erosive esophagitis, iron  deficiency anemia  Plan:    EGD with possible interventions as needed     HPI: Charlene [name] is a very pleasant 44 y.o. female here for EGD for follow up of severe erosive esophagitis and evaluation of iron  deficiency anemia .  Colonoscopy 10/17/20 negative  Last dose of Zepbound  last week, ?1/28, patient is unsure of the exact date, she is currently taking it every 10-14 days. Discussed in details the risk for retained gastric contents and aspiration risk. We may have to abort procedure if unable to complete successfully. Patient wanted to proceed and understood the potential risks.  The risks and benefits as well as alternatives of endoscopic procedure(s) have been discussed and reviewed.  The patient was provided an opportunity to ask questions and all were answered. The patient agreed with the plan and demonstrated an understanding of the instructions.   Past Medical History:  Diagnosis Date   Alcoholism (HCC)    Anxiety    Anxiety and depression    Bipolar 2 disorder (HCC)    Borderline personality disorder (HCC)    Chest pain    Chronic fatigue syndrome    Claustrophobia    on meds   Constipation    Depression    on meds   Elevated blood sugar 06/08/2014   Elevated LFTs    Fatigue    Fatty liver 10/2014   GERD (gastroesophageal reflux disease)    diet related   Heart murmur    Hyperlipidemia    on meds   Infertility associated with anovulation    Joint pain    Low grade squamous intraepithelial lesion (LGSIL) on Pap smear 10/30/2011   Major depressive disorder    Migraines    MS (multiple sclerosis)    Neuromuscular disorder (HCC)    Obesity    Obesity in pregnancy, antepartum    PCOS (polycystic ovarian syndrome)    Personal history of pre-term labor    Prediabetes    Pregnancy induced hypertension     pregnancy related- hx of   Psoriasis    Sleep apnea    SOB (shortness of breath)    Vitamin D  deficiency     Past Surgical History:  Procedure Laterality Date   COLONOSCOPY  10/17/2020   DILATION AND CURETTAGE OF UTERUS  08/15/2008   DILATION AND EVACUATION  12/29/2011   Procedure: DILATATION AND EVACUATION;  Surgeon: Winton Felt, MD;  Location: [facility];  Service: Gynecology;;  Dr, Edsel transferred case to Dr. Felt   LAPAROSCOPIC GASTRIC SLEEVE RESECTION N/A 12/07/2014   Procedure: LAPAROSCOPIC GASTRIC SLEEVE RESECTION;  Surgeon: Alm Angle, MD;  Location: [facility];  Service: General;  Laterality: N/A;    Prior to Admission medications  Medication Sig Start Date End Date Taking? Authorizing Provider  celecoxib (CELEBREX) 200 MG capsule Take 200 mg by mouth daily. 12/30/23   [provider]  DULoxetine (CYMBALTA) 60 MG capsule Take 60 mg by mouth every morning. 12/27/23   [provider]  lamoTRIgine (LAMICTAL) 200 MG tablet Take 200 mg by mouth daily. Patient taking differently: Take 300 mg by mouth daily. 10/01/22   [provider]  Multiple Vitamin (MULTIVITAMIN) tablet Take 1 tablet by mouth daily.    [provider]  pantoprazole  (PROTONIX ) 40 MG tablet TAKE 1 TABLET(40 MG) BY MOUTH DAILY 11/15/23   Heilingoetter, Cassandra L, PA-C  tirzepatide  (ZEPBOUND ) 12.5 MG/0.5ML Pen Inject 12.5 mg into the skin once a week. 01/03/24   [place]keley Adelita PENNER, MD  TREMFYA 100 MG/ML prefilled syringe 1 mL Subcutaneous every 8 weeks; Duration: 60 days 09/24/23 03/22/24  [provider]  TRINTELLIX 10 MG TABS tablet Take 10 mg by mouth daily. Patient not taking: Reported on 01/14/2024    [provider]  Vitamin D , Ergocalciferol , (DRISDOL ) 1.25 MG (50000 UNIT) CAPS capsule Take 1 capsule (50,000 Units total) by mouth every 14 (fourteen) days. 12/02/23   Verdon Parry D, MD  VYVANSE 60 MG capsule Take 60 mg by mouth every morning. 04/03/23    [provider]  zolmitriptan  (ZOMIG ) 5 MG tablet TAKE ONE TABLET BY MOUTH AS NEEDED FOR MIGRAINES. MAY REPEAT IN TWO HOURS IF NEEDED. MAX 2 TABLETS A DAY OR 4 TABLETS A WEEK 10/01/23   Sater, Charlie LABOR, MD    Current Outpatient Medications  Medication Sig Dispense Refill   celecoxib (CELEBREX) 200 MG capsule Take 200 mg by mouth daily.     DULoxetine (CYMBALTA) 60 MG capsule Take 60 mg by mouth every morning.     lamoTRIgine (LAMICTAL) 200 MG tablet Take 200 mg by mouth daily. (Patient taking differently: Take 300 mg by mouth daily.)     Multiple Vitamin (MULTIVITAMIN) tablet Take 1 tablet by mouth daily.     pantoprazole  (PROTONIX ) 40 MG tablet TAKE 1 TABLET(40 MG) BY MOUTH DAILY 90 tablet 0   tirzepatide  (ZEPBOUND ) 12.5 MG/0.5ML Pen Inject 12.5 mg into the skin once a week. 6 mL 0   TREMFYA 100 MG/ML prefilled syringe 1 mL Subcutaneous every 8 weeks; Duration: 60 days     TRINTELLIX 10 MG TABS tablet Take 10 mg by mouth daily. (Patient not taking: Reported on 01/14/2024)     Vitamin D , Ergocalciferol , (DRISDOL ) 1.25 MG (50000 UNIT) CAPS capsule Take 1 capsule (50,000 Units total) by mouth every 14 (fourteen) days. 6 capsule 0   VYVANSE 60 MG capsule Take 60 mg by mouth every morning.     zolmitriptan  (ZOMIG ) 5 MG tablet TAKE ONE TABLET BY MOUTH AS NEEDED FOR MIGRAINES. MAY REPEAT IN TWO HOURS IF NEEDED. MAX 2 TABLETS A DAY OR 4 TABLETS A WEEK 10 tablet 6   Current Facility-Administered Medications  Medication Dose Route Frequency Provider Last Rate Last Admin   0.9 %  sodium chloride  infusion  500 mL Intravenous Once Ketzia Guzek V, MD        Allergies as of 02/19/2024 - Review Complete 02/19/2024  Allergen Reaction Noted   Ciprofloxacin Other (See Comments) 02/11/2016   African mango [irvingia gabonensis] Other (See Comments) 03/29/2021   Atorvastatin  Other (See Comments) 06/06/2022   Rosuvastatin  Other (See Comments) 06/06/2022    Family History  Problem Relation Age  of Onset   Hypertension Mother    Hyperlipidemia Mother    Mental illness Mother    Dementia Mother        early onset   Depression Mother    Anxiety disorder Mother    Drug abuse Mother    Eating disorder Mother    Alcoholism Father    Heart disease Father    Alcoholism Maternal Grandmother    Mental illness Maternal Grandmother    Esophageal cancer Maternal Grandmother    Heart attack Maternal Grandfather    Colon cancer Neg Hx    Stomach cancer Neg Hx    Colon polyps Neg Hx    Rectal cancer Neg Hx  Social History   Socioeconomic History   Marital status: Married    Spouse name: Thedora   Number of children: 3   Years of education: 14   Highest education level: Not on file  Occupational History   Occupation: print production planner   Occupation: stay at home mom, part time daycare  Tobacco Use   Smoking status: Former    Current packs/day: 0.00    Average packs/day: 1 pack/day for 15.0 years (15.0 ttl pk-yrs)    Types: Cigarettes    Start date: 07/02/1995    Quit date: 07/02/2010    Years since quitting: 13.6   Smokeless tobacco: Never  Vaping Use   Vaping status: Never Used  Substance and Sexual Activity   Alcohol use: Not Currently    Comment: recovery alcoholic   Drug use: No   Sexual activity: Yes    Partners: Male    Birth control/protection: None    Comment: vasectomy  Other Topics Concern   Not on file  Social History Narrative   Work or School: homemaker      Home Situation: lives with husband, 2 twins and 1 boy      Spiritual Beliefs: none      Lifestyle: no regular exercise, poor diet   Caffeine  use: diet soda daily      Social Drivers of Health   Tobacco Use: Medium Risk (02/19/2024)   Patient History    Smoking Tobacco Use: Former    Smokeless Tobacco Use: Never    Passive Exposure: Not on [employer] Strain: Not on file  Food Insecurity: Not on file  Transportation Needs: Not on file  Physical Activity: Not on file  Stress:  Not on file  Social Connections: Unknown (04/12/2022)   [facility]   Social Network    Social Network: Not on file  Intimate Partner Violence: Unknown (04/12/2022)   Received from [facility]   HITS    Physically Hurt: Not on file    Insult or Talk Down To: Not on file    Threaten Physical Harm: Not on file    Scream or Curse: Not on file  Depression (PHQ2-9): Low Risk (11/28/2022)   Depression (PHQ2-9)    PHQ-2 Score: 0  Alcohol Screen: Not on file  Housing: Not on file  Utilities: Not on file  Health Literacy: Not on file    Review of Systems:  All other review of systems negative except as mentioned in the HPI.  Physical Exam: Vital signs in last 24 hours: BP 136/82   Pulse 86   Temp 98.1 F (36.7 C) (Temporal)   Ht 5' 5.5 (1.664 m)   Wt 193 lb (87.5 kg)   SpO2 100%   BMI 31.63 kg/m  General:   Alert, NAD Lungs:  Clear .   Heart:  Regular rate and rhythm Abdomen:  Soft, nontender and nondistended. Neuro/Psych:  Alert and cooperative. Normal mood and affect. A and O x 3  Reviewed labs, radiology imaging, old records and pertinent past GI work up  Patient is appropriate for planned procedure(s) and anesthesia in an ambulatory setting   K. Veena Gaje Tennyson , MD [phone]

## 2024-02-19 NOTE — Progress Notes (Signed)
 Report given to PACU, vss

## 2024-02-19 NOTE — Progress Notes (Signed)
0932 Robinul 0.1 mg IV given due large amount of secretions upon assessment.  MD made aware, vss 

## 2024-02-20 ENCOUNTER — Telehealth: Payer: Self-pay | Admitting: *Deleted

## 2024-02-20 ENCOUNTER — Telehealth: Payer: Self-pay

## 2024-02-20 DIAGNOSIS — D509 Iron deficiency anemia, unspecified: Secondary | ICD-10-CM

## 2024-02-20 NOTE — Progress Notes (Unsigned)
 "  Office Visit Note  Patient: Charlene [name]             Date of Birth: 10-10-1980           MRN: [id number]             PCP: [name], MD Referring: Verdon Louann BIRCH, MD Visit Date: 02/21/2024 Occupation: [employer]  Subjective:  No chief complaint on file.   History of Present Illness: Charlene [name] is a 44 y.o. female ***     Activities of Daily Living:  Patient reports morning stiffness for 45-90 minutes.   Patient Reports nocturnal pain.  Difficulty dressing/grooming: Reports Difficulty climbing stairs: Reports Difficulty getting out of chair: Denies Difficulty using hands for taps, buttons, cutlery, and/or writing: Reports  Review of Systems  Constitutional:  Positive for fatigue.  HENT:  Positive for mouth dryness. Negative for mouth sores.   Eyes:  Positive for dryness.  Respiratory:  Positive for shortness of breath.   Cardiovascular:  Positive for chest pain and palpitations.  Gastrointestinal:  Positive for constipation and diarrhea. Negative for blood in stool.  Endocrine: Negative for increased urination.  Genitourinary:  Negative for involuntary urination.  Musculoskeletal:  Positive for joint pain, gait problem, joint pain, joint swelling, myalgias, muscle weakness, morning stiffness, muscle tenderness and myalgias.  Skin:  Positive for color change, rash and hair loss. Negative for sensitivity to sunlight.  Allergic/Immunologic: Negative for susceptible to infections.  Neurological:  Positive for dizziness and headaches.  Hematological:  Negative for swollen glands.  Psychiatric/Behavioral:  Positive for depressed mood and sleep disturbance. The patient is nervous/anxious.     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   Anemia 09/05/2023   Lateral epicondylitis, right elbow 06/04/2023   Anxiety 05/13/2023   Prediabetes 05/13/2023   Other constipation 12/06/2022    Encounter for annual routine gynecological examination 11/28/2022   Polyphagia 05/10/2022   High risk medication use 03/28/2022   BMI 32.0-32.9,adult 03/15/2022   Status post laparoscopic sleeve gastrectomy (2016) 02/15/2022   Iron  deficiency anemia 02/15/2022   Notalgia 01/25/2022   Stress 01/25/2022   Insulin  resistance 11/29/2021   Elevated LFTs 11/08/2021   Pre-hypertension 10/17/2021   Vitamin D  deficiency 10/17/2021   SOBOE (shortness of breath on exertion) 10/17/2021   Other hyperlipidemia 08/09/2021   Rosacea 09/15/2020   Physical exam 06/16/2020   Alcohol abuse 05/01/2018   Bipolar disorder (HCC) 06/08/2016   Borderline personality disorder (HCC) 06/08/2016   Obesity (BMI 30-39.9) 12/07/2014   Hyperlipemia 06/08/2014   Elevated blood sugar 06/08/2014   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   Migraines 06/08/2014   Multiple sclerosis, relapsing-remitting    History of abnormal cervical Pap smear 10/30/2011   Adjustment disorder with anxiety 01/05/2008   Obesity, unspecified 11/26/2007    Past Medical History:  Diagnosis Date   Alcoholism (HCC)    Anxiety    Anxiety and depression    Bipolar 2 disorder (HCC)    Borderline personality disorder (HCC)    Chest pain    Chronic fatigue syndrome    Claustrophobia    on meds   Constipation    Depression    on meds   Elevated blood sugar 06/08/2014   Elevated LFTs    ETOH in the past    Fatigue    Fatty liver 10/2014   GERD (gastroesophageal reflux disease)    diet related   Heart  murmur    Hyperlipidemia    on meds   IDA (iron  deficiency anemia)    Infertility associated with anovulation    Joint pain    Low grade squamous intraepithelial lesion (LGSIL) on Pap smear 10/30/2011   Major depressive disorder    Migraines    MS (multiple sclerosis)    Neuromuscular disorder (HCC)    Obesity    Obesity in pregnancy, antepartum    Osteoarthritis    PCOS (polycystic ovarian syndrome)    Personal history of pre-term  labor    Prediabetes    Pregnancy induced hypertension    pregnancy related- hx of   Psoriasis    Psoriatic arthritis (HCC)    Sleep apnea    has not been using CPAP   SOB (shortness of breath)    Vitamin D  deficiency     Family History  Problem Relation Age of Onset   Hypertension Mother    Hyperlipidemia Mother    Mental illness Mother    Dementia Mother        early onset   Depression Mother    Anxiety disorder Mother    Drug abuse Mother    Eating disorder Mother    Alcoholism Father    Heart disease Father    Alcoholism Maternal Grandmother    Mental illness Maternal Grandmother    Esophageal cancer Maternal Grandmother    Heart attack Maternal Grandfather    Colon cancer Neg Hx    Stomach cancer Neg Hx    Colon polyps Neg Hx    Rectal cancer Neg Hx    Past Surgical History:  Procedure Laterality Date   COLONOSCOPY  10/17/2020   DILATION AND CURETTAGE OF UTERUS  08/15/2008   DILATION AND EVACUATION  12/29/2011   Procedure: DILATATION AND EVACUATION;  Surgeon: Winton Felt, MD;  Location: [facility];  Service: Gynecology;;  Dr, Edsel transferred case to Dr. Felt   LAPAROSCOPIC GASTRIC SLEEVE RESECTION N/A 12/07/2014   Procedure: LAPAROSCOPIC GASTRIC SLEEVE RESECTION;  Surgeon: Alm Angle, MD;  Location: [facility];  Service: General;  Laterality: N/A;   UPPER GASTROINTESTINAL ENDOSCOPY     Social History[1] Social History   Social History Narrative   Work or School: homemaker      Home Situation: lives with husband, 2 twins and 1 boy      Spiritual Beliefs: none      Lifestyle: no regular exercise, poor diet   Caffeine  use: diet soda daily        Immunization History  Administered Date(s) Administered   Influenza,inj,Quad PF,6+ Mos 11/03/2013   PFIZER(Purple Top)SARS-COV-2 Vaccination 03/27/2019, 04/20/2019   Tdap 11/03/2013   Zoster Recombinant(Shingrix ) 10/12/2021     Objective: Vital Signs: There were no vitals taken for this visit.    Physical Exam   Musculoskeletal Exam: ***  CDAI Exam: CDAI Score: -- Patient Global: --; Provider Global: -- Swollen: --; Tender: -- Joint Exam 02/21/2024   No joint exam has been documented for this visit   There is currently no information documented on the homunculus. Go to the Rheumatology activity and complete the homunculus joint exam.  Investigation: No additional findings.  Imaging: No results found.  Recent Labs: Lab Results  Component Value Date   WBC 5.6 11/05/2023   HGB 14.4 11/05/2023   PLT 292 11/05/2023   NA 143 01/14/2024   K 3.8 01/14/2024   CL 107 (H) 01/14/2024   CO2 20 01/14/2024   GLUCOSE 105 (H) 01/14/2024  BUN 13 01/14/2024   CREATININE 0.66 01/14/2024   BILITOT 0.2 01/14/2024   ALKPHOS 60 01/14/2024   AST 17 01/14/2024   ALT 15 01/14/2024   PROT 6.0 01/14/2024   ALBUMIN 4.1 01/14/2024   CALCIUM  9.0 01/14/2024   GFRAA >60 12/02/2014   QFTBGOLDPLUS Negative 09/26/2021    Speciality Comments: No specialty comments available.  Procedures:  No procedures performed Allergies: Ciprofloxacin, African mango [irvingia gabonensis], Atorvastatin , and Rosuvastatin    Assessment / Plan:     Visit Diagnoses: No diagnosis found.  Orders: No orders of the defined types were placed in this encounter.  No orders of the defined types were placed in this encounter.   Face-to-face time spent with patient was *** minutes. Greater than 50% of time was spent in counseling and coordination of care.  Follow-Up Instructions: No follow-ups on file.   Alfonso Patterson, LPN  Note - This record has been created using [employer].  Chart creation errors have been sought, but may not always  have been located. Such creation errors do not reflect on  the standard of medical care.     [1]  Social History Tobacco Use   Smoking status: Former    Current packs/day: 0.00    Average packs/day: 1 pack/day for 15.0 years (15.0 ttl pk-yrs)    Types:  Cigarettes    Start date: 07/02/1995    Quit date: 07/02/2010    Years since quitting: 13.6   Smokeless tobacco: Never  Vaping Use   Vaping status: Never Used  Substance Use Topics   Alcohol use: Not Currently    Comment: recovery alcoholic   Drug use: No   "

## 2024-02-20 NOTE — Telephone Encounter (Signed)
 S/P Cashay's EGD done 02/19/2024 Dr Nandigam has ordered a capsule study for her IDA. I called and spoke with Quinnie and she is set up for 03/09/2024 at 8:30AM. She was out shopping when we spoke so we didn't go over the instructions. She said she is on [facility] and can see them in there. I will call her back later on to make sure she has no questions.

## 2024-02-20 NOTE — Telephone Encounter (Signed)
 Attempted f/u phone call. No answer. Left message.

## 2024-02-21 ENCOUNTER — Ambulatory Visit

## 2024-02-21 MED ORDER — MELOXICAM 15 MG PO TABS: 15.0000 mg | ORAL_TABLET | Freq: Every day | ORAL | 0 refills | Status: AC

## 2024-02-21 NOTE — Patient Instructions (Signed)
 Upadacitinib Extended-Release Tablets What is this medication? UPADACITINIB (ue PAD a SYE ti nib) treats autoimmune conditions, such as arthritis, eczema, and ulcerative colitis. It is often used when other medications have not worked well enough or cannot be tolerated. It works by slowing down an overactive immune system. This decreases inflammation. This medicine may be used for other purposes; ask your health care provider or pharmacist if you have questions. COMMON BRAND NAME(S): RINVOQ What should I tell my care team before I take this medication? They need to know if you have any of these conditions: Cancer Current or past tobacco use Diabetes Have had a heart attack or stroke Have had blood clots Have been in close contact with someone who has tuberculosis (TB) Heart disease High blood pressure High cholesterol HIV or AIDS Infection or have had an infection that does not go away, such as tuberculosis (TB), shingles, hepatitis, herpes Kidney disease Live or have traveled to the [facility] [place]  or the [place]  or [place] Liver disease Low blood cell levels (white cells, red cells, and platelets) Lung or breathing disease, such as asthma or COPD Recent or upcoming vaccine Stomach or intestine problems Taking NSAIDs, medications for pain and inflammation, such as ibuprofen  or naproxen Taking steroid medications, such as prednisone  or cortisone Weakened immune system An unusual or allergic reaction to upadacitinib, other medications, foods, dyes, or preservatives Pregnant or trying to get pregnant Breastfeeding How should I use this medication? Take this medication by mouth with water . Take it as directed on the prescription label at the same time every day. Do not cut, crush, or chew this medication. Swallow the tablets whole. You can take it with or without food. If it upsets your stomach, take it with food. Keep taking it unless your care team tells you to stop. Do  not take this medication with grapefruit juice. A special MedGuide will be given to you by the pharmacist with each prescription and refill. Be sure to read this information carefully each time. Talk to your care team about the use of this medication in children. While it may be prescribed for children as young as 2 years for selected conditions, precautions do apply. Overdosage: If you think you have taken too much of this medicine contact a poison control center or emergency room at once. NOTE: This medicine is only for you. Do not share this medicine with others. What if I miss a dose? If you miss a dose, take it as soon as you can. If it is almost time for your next dose, take only that dose. Do not take double or extra doses. What may interact with this medication? Certain antivirals for HIV or hepatitis Certain medications for fungal infections, such as ketoconazole, itraconazole, posaconazole, voriconazole Certain medications for seizures, such as carbamazepine, phenobarbital, phenytoin Clarithromycin Grapefruit and grapefruit juice Live virus vaccines Medications that lower your chance of fighting infection, such as abatacept, adalimumab, azathioprine, cyclosporine, etanercept, infliximab, rituximab Rifampin Supplements, such as St. John's wort Other medications may affect the way this medication works. Talk with your care team about all of the medications you take. They may suggest changes to your treatment plan to lower the risk of side effects and to make sure your medications work as intended. This list may not describe all possible interactions. Give your health care provider a list of all the medicines, herbs, non-prescription drugs, or dietary supplements you use. Also tell them if you smoke, drink alcohol, or use illegal drugs. Some items  may interact with your medicine. What should I watch for while using this medication? Visit your care team for regular checks on your progress.  Tell your care team if your symptoms do not start to get better or if they get worse. You may need blood work done while you are taking this medication. This medication may increase your risk of getting an infection. Call your care team for advice if you get a fever, chills, sore throat, or other symptoms of a cold or flu. Do not treat yourself. Try to avoid being around people who are sick. Your care team will screen you for tuberculosis (TB) before you start this medication. If they think you are at risk, you may be treated with medication for TB. You should start taking the medication for TB before you start this medication. Make sure to finish the full course of TB medication. Talk to your care team about your vaccination history. To lower your risk of infection, you may need certain vaccines before you start this medication. Talk to your care team about your risk of cancer. You may be more at risk for certain types of cancer if you take this medication. Tobacco use may increase your risk of cancer. Talk to your care team about having your skin checked for cancer while taking this medication. Limit the amount of time you spend in the sun. Wear protective clothing and sunscreen when you are in the sun. Do not use sun lamps, tanning beds, or tanning booths. This medication may increase the risk of blood clots, heart attack, stroke, or death. High blood pressure, high cholesterol, diabetes, increased age, excess weight, and tobacco use increase this risk. Call emergency services right away if you have symptoms of a heart attack or stroke. This medication can increase bad cholesterol and fats (such as LDL, triglycerides) and decrease good cholesterol (HDL) in your blood. You may need blood tests to check your cholesterol. Ask your care team what you can do to lower your risk of high cholesterol while taking this medication. Tell your care team right away if you have any change in your eyesight. Talk to your  care team if you often see part of the tablet in your stool. Talk to your care team if you may be pregnant. Serious birth defects can occur if you take this medication during pregnancy and for 4 weeks after the last dose. You will need a negative pregnancy test before starting this medication. Contraception is recommended while taking this medication and for 4 weeks after the last dose. Your care team can help you find the option that works for you. Do not breastfeed while taking this medication and for 6 days after the last dose. What side effects may I notice from receiving this medication? Side effects that you should report to your care team as soon as possible: Allergic reactions--skin rash, itching, hives, swelling of the face, lips, tongue, or throat Blood clot--pain, swelling, or warmth in the leg, shortness of breath, chest pain Change in vision Heart attack--pain or tightness in the chest, shoulders, arms, or jaw, nausea, shortness of breath, cold or clammy skin, feeling faint or lightheaded Infection--fever, chills, cough, sore throat, wounds that don't heal, pain or trouble when passing urine, general feeling of discomfort or being unwell Liver injury--right upper belly pain, loss of appetite, nausea, light-colored stool, dark yellow or brown urine, yellowing skin or eyes, unusual weakness or fatigue Low red blood cell level--unusual weakness or fatigue, dizziness, headache, trouble breathing  Stomach pain that is severe, does not go away, or gets worse Stroke--sudden numbness or weakness of the face, arm, or leg, trouble speaking, confusion, trouble walking, loss of balance or coordination, dizziness, severe headache, change in vision Side effects that usually do not require medical attention (report these to your care team if they continue or are bothersome): Acne Cough Headache Nausea Runny or stuffy nose This list may not describe all possible side effects. Call your doctor for  medical advice about side effects. You may report side effects to FDA at 1-800-FDA-1088. Where should I keep my medication? Keep out of the reach of children and pets. Store at room temperature between 20 and 25 degrees C (68 and 77 degrees F). Protect from moisture. Keep the container tightly closed. Keep this medication in the original container until you are ready to take it. Get rid of any unused medication after the expiration date. To get rid of medications that are no longer needed or have expired: Take the medication to a medication take-back program. Check with your pharmacy or law enforcement to find a location. If you cannot return the medication, check the label or package insert to see if the medication should be thrown out in the garbage or flushed down the toilet. If you are not sure, ask your care team. If it is safe to put it in the trash, empty the medication out of the container. Mix the medication with cat litter, dirt, coffee grounds, or other unwanted substance. Seal the mixture in a bag or container. Put it in the trash. NOTE: This sheet is a summary. It may not cover all possible information. If you have questions about this medicine, talk to your doctor, pharmacist, or health care provider.  2024 Elsevier/Gold Standard (2022-12-14 00:00:00) Vaccines You are taking a medication(s) that can suppress your immune system.  The following immunizations are recommended: Flu annually Covid-19  Td/Tdap (tetanus, diphtheria, pertussis) every 10 years Pneumonia (Prevnar 15 then Pneumovax 23 at least 1 year apart.  Alternatively, can take Prevnar 20 without needing additional dose) Shingrix : 2 doses from 4 weeks to 6 months apart  Please check with your PCP to make sure you are up to date.   If you have signs or symptoms of an infection or start antibiotics: First, call your PCP for workup of your infection. Hold your medication through the infection, until you complete your  antibiotics, and until symptoms resolve if you take the following: Injectable medication (Actemra, Benlysta, Cimzia, Cosentyx, Enbrel, Humira, Kevzara, Orencia, Remicade, Simponi, Stelara, Taltz, Tremfya) Methotrexate Leflunomide (Arava) Mycophenolate (Cellcept) Earma, Olumiant, or Rinvoq

## 2024-02-25 ENCOUNTER — Inpatient Hospital Stay: Admitting: Physician Assistant

## 2024-02-25 ENCOUNTER — Inpatient Hospital Stay: Attending: Internal Medicine

## 2024-03-09 ENCOUNTER — Encounter

## 2024-03-09 ENCOUNTER — Ambulatory Visit (INDEPENDENT_AMBULATORY_CARE_PROVIDER_SITE_OTHER): Admitting: Family Medicine

## 2024-03-17 ENCOUNTER — Ambulatory Visit: Admitting: Internal Medicine

## 2024-03-20 ENCOUNTER — Ambulatory Visit

## 2024-04-06 ENCOUNTER — Ambulatory Visit (INDEPENDENT_AMBULATORY_CARE_PROVIDER_SITE_OTHER): Admitting: Family Medicine

## 2024-04-23 ENCOUNTER — Ambulatory Visit: Admitting: Neurology

## 2024-07-22 ENCOUNTER — Ambulatory Visit
